# Patient Record
Sex: Female | Born: 1945 | Race: Black or African American | Hispanic: No | Smoking: Never smoker
Health system: Southern US, Community
[De-identification: ages and names within clinical notes are randomized; demographics above are authoritative.]

## PROBLEM LIST (undated history)

## (undated) DIAGNOSIS — I214 Non-ST elevation (NSTEMI) myocardial infarction: Secondary | ICD-10-CM

## (undated) DIAGNOSIS — I219 Acute myocardial infarction, unspecified: Secondary | ICD-10-CM

## (undated) DIAGNOSIS — N183 Chronic kidney disease, stage 3 unspecified: Secondary | ICD-10-CM

## (undated) DIAGNOSIS — D45 Polycythemia vera: Secondary | ICD-10-CM

## (undated) DIAGNOSIS — I10 Essential (primary) hypertension: Secondary | ICD-10-CM

## (undated) DIAGNOSIS — G4730 Sleep apnea, unspecified: Secondary | ICD-10-CM

## (undated) DIAGNOSIS — E785 Hyperlipidemia, unspecified: Secondary | ICD-10-CM

## (undated) DIAGNOSIS — E079 Disorder of thyroid, unspecified: Secondary | ICD-10-CM

## (undated) DIAGNOSIS — F419 Anxiety disorder, unspecified: Secondary | ICD-10-CM

## (undated) DIAGNOSIS — Z9289 Personal history of other medical treatment: Secondary | ICD-10-CM

## (undated) DIAGNOSIS — E039 Hypothyroidism, unspecified: Secondary | ICD-10-CM

## (undated) HISTORY — DX: Polycythemia vera: D45

## (undated) HISTORY — DX: Chronic kidney disease, stage 3 unspecified: N18.30

## (undated) HISTORY — PX: ABDOMINAL HYSTERECTOMY: SHX81

## (undated) HISTORY — DX: Essential (primary) hypertension: I10

## (undated) HISTORY — PX: CARDIAC CATHETERIZATION: SHX172

## (undated) HISTORY — DX: Hyperlipidemia, unspecified: E78.5

---

## 1991-02-09 HISTORY — PX: VAGINAL HYSTERECTOMY: SUR661

## 1999-02-04 ENCOUNTER — Encounter: Admission: RE | Admit: 1999-02-04 | Discharge: 1999-05-05 | Payer: Self-pay | Admitting: Neurology

## 1999-03-13 ENCOUNTER — Ambulatory Visit (HOSPITAL_BASED_OUTPATIENT_CLINIC_OR_DEPARTMENT_OTHER): Admission: RE | Admit: 1999-03-13 | Discharge: 1999-03-13 | Payer: Self-pay | Admitting: Orthopedic Surgery

## 1999-05-25 ENCOUNTER — Encounter: Payer: Self-pay | Admitting: Neurology

## 1999-05-25 ENCOUNTER — Encounter: Admission: RE | Admit: 1999-05-25 | Discharge: 1999-05-25 | Payer: Self-pay | Admitting: Neurology

## 2003-05-02 ENCOUNTER — Encounter
Admission: RE | Admit: 2003-05-02 | Discharge: 2003-05-15 | Payer: Self-pay | Admitting: Physical Medicine & Rehabilitation

## 2006-06-08 ENCOUNTER — Encounter: Payer: Self-pay | Admitting: Cardiology

## 2006-06-09 ENCOUNTER — Inpatient Hospital Stay (HOSPITAL_COMMUNITY): Admission: EM | Admit: 2006-06-09 | Discharge: 2006-06-11 | Payer: Self-pay | Admitting: Emergency Medicine

## 2006-06-09 ENCOUNTER — Ambulatory Visit: Payer: Self-pay | Admitting: Cardiology

## 2006-06-10 ENCOUNTER — Encounter: Payer: Self-pay | Admitting: Internal Medicine

## 2006-07-27 ENCOUNTER — Ambulatory Visit: Payer: Self-pay | Admitting: Physician Assistant

## 2006-08-15 ENCOUNTER — Encounter: Payer: Self-pay | Admitting: Cardiology

## 2006-08-25 ENCOUNTER — Ambulatory Visit: Payer: Self-pay | Admitting: Physician Assistant

## 2006-08-25 ENCOUNTER — Encounter: Payer: Self-pay | Admitting: Cardiology

## 2006-08-29 ENCOUNTER — Encounter: Payer: Self-pay | Admitting: Physician Assistant

## 2006-09-21 ENCOUNTER — Ambulatory Visit: Payer: Self-pay | Admitting: Internal Medicine

## 2006-09-21 ENCOUNTER — Ambulatory Visit: Payer: Self-pay | Admitting: Cardiology

## 2007-02-24 ENCOUNTER — Ambulatory Visit: Payer: Self-pay | Admitting: Cardiology

## 2007-02-24 ENCOUNTER — Encounter: Payer: Self-pay | Admitting: Physician Assistant

## 2007-03-08 ENCOUNTER — Ambulatory Visit: Payer: Self-pay | Admitting: Cardiology

## 2007-03-28 ENCOUNTER — Encounter: Payer: Self-pay | Admitting: Physician Assistant

## 2007-06-14 ENCOUNTER — Ambulatory Visit: Payer: Self-pay | Admitting: Cardiology

## 2008-01-17 DIAGNOSIS — E785 Hyperlipidemia, unspecified: Secondary | ICD-10-CM | POA: Insufficient documentation

## 2008-01-17 DIAGNOSIS — I2510 Atherosclerotic heart disease of native coronary artery without angina pectoris: Secondary | ICD-10-CM | POA: Insufficient documentation

## 2008-01-17 DIAGNOSIS — I10 Essential (primary) hypertension: Secondary | ICD-10-CM | POA: Insufficient documentation

## 2009-06-17 ENCOUNTER — Other Ambulatory Visit: Admission: RE | Admit: 2009-06-17 | Discharge: 2009-06-17 | Payer: Self-pay | Admitting: Interventional Radiology

## 2009-06-17 ENCOUNTER — Encounter: Admission: RE | Admit: 2009-06-17 | Discharge: 2009-06-17 | Payer: Self-pay | Admitting: Internal Medicine

## 2010-06-23 NOTE — Assessment & Plan Note (Signed)
[facility]                          [facility]   [name], [name] STEGMANN                    MRN:          [id number]  DATE:07/27/2006                            DOB:          05-06-1945    PRIMARY CARDIOLOGIST:  Luis Abed, MD, F.A.C.C.   REASON FOR VISIT:  Post-hospital followup.   [name] [name] is a 65 year old female with no prior cardiac history,  recently transferred directly from [facility] to [facility] for  further evaluation of chest pain with associated abnormal cardiac  markers.   Patient was felt to have non-ST-elevation myocardial infarction with a  troponin of 1.37 and an abnormal electrocardiogram.   Cardiac catheterization by Dr. Charlies Constable, however, suggested only  mild nonobstructive coronary artery disease with mild LV dysfunction (EF  50%), but with no areas of hypokinesis.   Further workup consisted of a CT scan of the chest, which was negative  for pulmonary embolus, but did yield a 5 mm right lower lobe nodule with  recommendation to follow up in 12 months.  This also revealed multiple  thyroid nodules and patient is currently undergoing further evaluation  for this by Dr. Sherryll Burger.   From a cardiovascular standpoint, patient denies any recurrent angina  pectoris.  She is tolerating her medications, which include the PLATO  study drug, and had no complications of her right groin incision site.   Electrocardiogram today reveals normal sinus rhythm at 68 BPM with  normal axis and nonspecific ST abnormalities.   CURRENT MEDICATIONS:  1. [name] (versus clopidogrel).  2. Ramipril 5 daily.  3. Carvedilol 6.25 daily.  4. Simvastatin 40 q.h.s.   PHYSICAL EXAMINATION:  Blood pressure 150/101, pulse 71, regular; weight  196.  GENERAL:  Patient is a 65 year old female, sitting upright, in no  distress.  HEENT:  Normocephalic, atraumatic.  NECK:  Supple.  Full bilateral carotid pulses  with no bruits; no JVD at  90 degrees.  LUNGS:  Clear to auscultation in all fields.  HEART:  Regular rate and rhythm (S1, S2).  No murmurs, rubs or gallops.  EXTREMITIES:  Right groin stable with no hematoma, ecchymosis, or bruit  on auscultation.  Normal intact femoral and distal pulse without pedal  edema.  NEUROLOGIC:  No focal deficits.   IMPRESSION:  1. Nonobstructive coronary artery disease.      a.     Status post abnormal serial cardiac markers of undetermined       etiology.      b.     Enrolled in PLATO study.      c.     Question coronary vasospasm.  2. Preserved LV function/moderate LVH.      a.     By 2D echo.  3. Hypertension.  4. Multiple thyroid nodules.  5. Right lung nodule.      a.     Recommended followup CT scan in one year.   PLAN:  1. Aggressive hypertension management with up-titration of Coreg to      6.25 b.i.d. and substitution of Ramipril with lisinopril, given the  patient's severe financial constraints.  2. Follow up with [place] research team with respect to PLATO study,      as recommended.  3. Patient strongly advised to take low-dose aspirin on a daily basis.  4. Recommend fasting lipids/liver profile in three months for      assessment of lipid status.  5. Schedule return clinic followup with myself and Dr. Willa Rough in      three months.      Gene Serpe, PA-C  Electronically Signed      Learta Codding, MD,FACC  Electronically Signed   GS/MedQ  DD: 07/27/2006  DT: 07/27/2006  Job #: [id number]   cc:   Kirstie Peri, MD

## 2010-06-23 NOTE — Assessment & Plan Note (Signed)
[facility]                          [facility]   NAME:[name] [name], [name] [name]                    MRN:          [id number]  DATE:08/25/2006                            DOB:          12/18/1945    PRIMARY CARDIOLOGIST:  [name] Abed, MD, [facility]   PRIMARY CARE PHYSICIAN:  [name] Peri, MD   HISTORY OF PRESENT ILLNESS:  [name] [name] is a 65 year old female  patient with a history of non-ST elevation myocardial infarction in May  of 2008, with mild to moderate non-obstructive coronary disease at  catheterization who was enrolled in the [place] study. She returns to the  office today for followup. She recently came off of the study drug  secondary to excessive bruising. She follows up with Research in a few  weeks for an end-of-study visit. She notes that the bruising is  improving since she stopped the medication. I spoke to [name] [name] in  Research to find out more about the [place] study. Apparently, this is a  study that compares clopidogrel to another platelet inhibitor. The study  is double-blinded and we do not know what [name] [name] was receiving.  She denies any other bleeding. She did have one episode of chest  discomfort recently. She denies any symptoms reminiscent of her  myocardial infarction. She notes that the discomfort was more of an  awareness of maybe some pressure in her chest when she completed a three  mile walk in extreme heat. She denied any shortness of breath, nausea,  diaphoresis or radiating symptoms. She has been walking since then  without any symptoms. She usually 2-3 miles 3-5 days a week. She is  taking all of her medications as prescribed. She has been under quite a  bit of stress lately and actually started to cry during our interview at  one point.   MEDICATIONS:  1. Ramipril 5 mg daily.  2. Simvastatin 40 mg q nightly.  3. Carvedilol 6.25 mg b.i.d.  4. Aspirin 81 mg daily.  5. Multivitamin daily.  6.  Nitroglycerine p.r.n. chest pain.   ALLERGIES:  No known drug allergies.   PHYSICAL EXAMINATION:  She is a well-nourished, well-developed female in  no acute distress. Blood pressure is 164/92, pulse 68, weight 194  pounds.  HEENT: Is normal.  NECK: Without JVD.  CARDIAC: Normal S1, S2, regular rate and rhythm without murmur.  LUNGS:  Are clear to auscultation bilaterally without wheezing, rhonchi  or rales.  ABDOMEN: Soft and nontender.  EXTREMITIES: Without edema.  NEUROLOGIC: She is alert and oriented x3. Cranial nerves II-XII are  grossly intact.  Carotids are without bruits bilaterally.  ENDOCRINE: She does have prominence of the right lobe of her thyroid  noted on examination-she apparently has thyroid ultrasound scheduled  today at [facility].   Electrocardiogram reveals sinus bradycardia with a heart rate of 58.  Normal axis. LVH by voltage criteria. No ischemic changes.   IMPRESSION:  1. Atypical chest pain, non-cardiac.  2. Coronary artery disease.      a.     Status post non-ST elevation myocardial infarction in May of  2008, without obvious culprit at cardiac catheterization.      b.     Left anterior descending artery (LAD) mid 40% stenosis at       cardiac catheterization.      c.     Plato study.  3. Preserved left ventricular function with an ejection fraction of      50% and no wall motion abnormalities at cardiac catheterization Jun 09, 2006.  4. Hypertension, uncontrolled.  5. Hyperlipidemia, treated.  6. Thyroid nodules.      a.     Followup per primary care physician.  7. History of right lung nodule.      a.     Repeat CT scan pending May of 2009.  8. Ecchymosis.      a.     Recent discontinuation of [name].   PLAN:  The patient presents to the office today for followup after  discontinuing her [name]. She did have increased ecchymosis.  She is currently only on a baby aspirin a day. I think in light of her   acute coronary syndrome in May of 2008, she needs to stay on  antiplatelet therapy. However, she has had difficulty with combination  of study drug and aspirin with increased ecchymosis. At catheterization  she has mild to moderate non-obstructive disease. No percutaneous  coronary intervention was performed. In light of her financial  constraints, I do not think it is feasible to try to place her on  Plavix. I think it should be safe for her to remain on aspirin only at  this point in time. Therefore, I have asked her to increase her aspirin  dose to 325 mg a day. We will check a CBC to followup on her platelet  count. She will followup with the research staff as scheduled some time  in the next several weeks.   The patient does need better blood pressure control. I have elected to  place her on 12.5 mg of hydrochlorothiazide in addition to her ramipril.  Will get followup BMET in a week to followup on her renal function and  potassium.   She did report some chest discomfort to me today. This was very atypical  and unlike her myocardial infarction pain. She has been quite active  before and after this episode of chest discomfort without any symptoms.  I do not think this was cardiac. Her electrocardiogram is stable. She  really had no evidence of coronary vasospasm at catheterization. This  could certainly be brought into consideration in the future if her blood  pressure remains uncontrolled. At that point, we could certainly  consider adding something like amlodipine.   She will followup with Dr.  Myrtis [name] as scheduled in the next several  months.      Tereso Newcomer, PA-C  Electronically Signed      Learta Codding, MD,FACC  Electronically Signed   SW/MedQ  DD: 08/25/2006  DT: 08/25/2006  Job #: [id number]   cc:   [name] Peri, MD

## 2010-06-23 NOTE — Assessment & Plan Note (Signed)
[facility]                            CARDIOLOGY OFFICE NOTE   NAME:[name] [name], [name] [name]                    MRN:          [id number]  DATE:09/21/2006                            DOB:          10/10/1945    This patient is being seen as part of the PLATO research program.  She  has completed the program, and this is the final visit.   This is a 65 year old African American female patient who presented to  the hospital with non-ST-elevation MI and abnormal EKG in May but at the  time of cardiac catheterization by Dr. Juanda Chance had only mild  nonobstructive coronary disease.  He felt coronary spasm was a  possibility, and she did have transient ST depression during the  procedure but felt this would be a diagnosis of exclusion.  She has not  had any chest pain since then.  She is walking 2-3 miles a day 5 days a  week and is doing quite well.  She did develop quite a bit of bruising  while on the PLATO study which compared Plavix to another platelet  inhibitor.  Once this was stopped and she was on the aspirin alone, the  bruising improved.   CURRENT MEDICATIONS:  1. Aspirin 81 mg daily.  2. Plavix is stopped.  3. Coreg 6.25 mg b.i.d.  4. Simvastatin 40 mg q.h.s.  5. Altace 5 mg daily.  6. Hydrochlorothiazide 12.5 mg daily.  7. Multivitamin daily.   PHYSICAL EXAMINATION:  GENERAL:  This is a pleasant 65 year old black  female in no acute distress.  VITAL SIGNS:  Blood pressure 142/90, pulse 71, weight 192.  NECK:  Without JVD, HR, bruit, or thyroid enlargement.  LUNGS:  Clear anterior, posterior, and lateral.  HEART:  Regular rate and rhythm at 70 beats per minute.  Normal S1 and  S2 with a 1/6 systolic ejection murmur at the left sternal border.  ABDOMEN:  Soft without organomegaly, masses, lesions.  EXTREMITIES:  Without clubbing, cyanosis, or edema.  She has good distal  pulses.   IMPRESSION:  1. Non-ST-segment elevation myocardial infarction on  Jun 09, 2006,      question secondary to coronary spasm with minimal nonobstructive      coronary artery disease on catheterization.  2. Hypertension.  3. Chronic sinusitis.  4. Multiple thyroid nodules found on CT as well as a 5-mm nodule in      her right lower lobe.  Recommend followup CT in 12 months.  5. Hyperlipidemia.  6. Obesity.   PLAN:  The patient is stable from a cardiac standpoint.  I will leave  her off of Plavix since she had so much bruising on the study drug.  She  has a scheduled appointment to see Dr. Myrtis Ser back on November 11, 2006.  She knows to call if she has any problems in the interim.  Her blood  pressure is elevated, and I have increased her Coreg to 12.5 mg b.i.d.      Jacolyn Reedy, PA-C  Electronically Signed      Doylene Canning. Ladona Ridgel, MD  Electronically Signed  ML/MedQ  DD: 09/21/2006  DT: 09/22/2006  Job #: [id number]

## 2010-06-23 NOTE — Assessment & Plan Note (Signed)
[facility]                          [facility] NOTE   NAME:Sharber, CADI RHINEHART                    MRN:          [id number]  DATE:02/24/2007                            DOB:          1945/10/24    CARDIOLOGIST:  Luis Abed, MD.   PRIMARY CARE PHYSICIAN:  Kirstie Peri, MD.   REASON FOR VISIT:  Walk-in for blood pressure.   HISTORY OF PRESENT ILLNESS:  Ms. [name] [name] is a 65 year old female  patient with a history of non-ST-elevation myocardial infarction in May  2008, who had just mild to moderate nonobstructive coronary disease at  catheterization.  The culprit for her non-ST elevation MI was not clear.  She was treated medically.  When last seen in the office in July 2008,  her blood pressure medications were adjusted.  We added HCTZ to her  medication regimen.   The patient sees Dr. Leslie Dales for her goiter.  She saw him in September  and her blood pressure was 170/116, and her blood pressure today at an  appointment with him was 152/100.  He had asked the patient to follow up  with [place] to get her blood pressure under control.  The patient walked  into the office today.  I spoke to Dr. Leslie Dales, who noted that the  patient just needed to follow up for blood pressure.  He felt that she  was somewhat confused about her medications.   The patient notes a significant headache.  This has been ongoing for the  last 3 weeks.  It seems to be related to sinus congestion.  She denies  any fevers but has noted a purulent postnasal drip.  She denies any  otalgia but has had some dental pain.  She denies any visual changes.  She denies any photophobia or nausea and vomiting.  She denies any chest  pain or shortness of breath.  She denies any monocular blindness,  unilateral weakness, difficulty with speech or facial droop.   CURRENT MEDICATIONS:  1. Simvastatin 40 mg q.h.s.  2. Carvedilol 6.25 mg b.i.d.  3. Hydrochlorothiazide 25 mg daily.  4. Aspirin 325 mg daily.   ALLERGIES:  No known drug allergies.   PHYSICAL EXAM:  She is a well-nourished, well-developed female, in no  acute distress.  Blood pressure 150/92 on the right, 145/74 on the left.  Repeat blood  pressure by me is 154/90 on the left, 158/94 on the right.  Pulse 65.  Respirations 15.  HEENT:  Head:  Normocephalic, atraumatic.  Eyes:  PERRLA, EOMI.  Sclerae  are clear.  Fundi are within normal limits bilaterally.  Mucous  membranes were moist.  LYMPHATIC:  Without lymphadenopathy.  CARDIAC:  Normal S1, S2, regular rate and rhythm.  LUNGS:  Clear to auscultation bilaterally.  ABDOMEN:  Soft, nontender.  EXTREMITIES:  Without edema.  NEUROLOGIC:  She is alert and oriented x3.  Cranial nerves II-XII are  grossly intact.  Strength is 5/5 all extremities and axial groups.   IMPRESSION:  1. Hypertension, uncontrolled.  2. Coronary artery disease.      a.     Status  post non-ST-elevation myocardial function in May 2008       without obvious culprit (mid left anterior descending artery 40%       stenosis at catheterization).      b.     Previous PLATO study participant.  3. Good left ventricular function, ejection fraction of 50%.  4. Hyperlipidemia.  5. Goiter, followed by Dr. Leslie Dales.  6. History of right lung nodule.      a.     Repeat CT scan pending May 2009.  7. Acute sinusitis.   PLAN:  The patient presents as a walk-in to the office for blood  pressure.  As noted, I spoke with Dr. Leslie Dales, who was concerned that  she was somewhat confused about her medications.  She had had an  appointment set up for January 28 but decided to come in today.  She  does have a headache that seems to be more consistent with sinusitis  than anything else.  Her symptoms are not consistent with end-organ  damage.  She does need better blood pressure control.  She is somewhat  confused about her medications.  She was supposed to have  hydrochlorothiazide added to her  lisinopril but instead she discontinued  her lisinopril.  At this point in time, we plan to:   1. Decrease her hydrochlorothiazide to 12.5 mg a day.  2. Add back lisinopril 10 mg a day.  3. Check a BMET today and follow up BMET in one week's time.  4. Treat her sinusitis with Augmentin XR 2 tablets b.i.d. for 10 days.      I have also asked her to take Mucinex 600 mg b.i.d. over-the-      counter for 10 days.  She is to obtain saline nose spray.  She can      use Afrin for 2-3 days only if necessary.  I do not think this will      affect her blood pressure much if she needs to use it.  5. I have written out all of the medications for her today so that she      better understands what      she is supposed to be taking.  6. She will follow up as scheduled with Dr. Myrtis Ser March 08, 2007, or      sooner as needed.      Tereso Newcomer, PA-C  Electronically Signed      Jonelle Sidle, MD  Electronically Signed   SW/MedQ  DD: 02/24/2007  DT: 02/25/2007  Job #: [id number]   cc:   Kirstie Peri, MD  Veverly Fells. [place], M.D.

## 2010-06-23 NOTE — Assessment & Plan Note (Signed)
[facility]                                 ON-CALL NOTE   NAME:[name] [name], [name] [name]                    MRN:          [id number]  DATE:08/13/2006                            DOB:          [birth year]    Telephone conversation with Francisca December dated August 13, 2006 at 13:58,  attending physician Dr. Dietrich Pates.  I received a call through the  answering service for [facility].  She states she is a participant  in the [place] drug study and is either on Plavix or another dry for her  heart disease.  She states she has been taking the medication, so she  was discharged home, but on the previous date noticed some unusual  bruising on her lower extremities and then began to notice the same  bruise pattern over her body; however, she states she has not fallen or  injured herself or run into anything, denied any active bleeding, no  pain around bruising, but she was very concerned because none of this  started until after she began taking the medication.  I asked her if she  had a phone number to get in touch with the research department.  She  stated she had a #[phone], which is the hospital operator.  I then told  her I would see what I could do.  I called the operator to see if she  had a number to get in touch with the research department.  She stated  that she did not have any numbers other than the pagers that we had for  research team.  I then tried to call Kennon Rounds with the research department  at home.  There was no answer.  I was unable to leave a message.  I then  called Ms. Rovira back and told her not to take the medication that  day or Sunday, and I would have someone from the research department get  in touch with her as soon as possible Monday morning.  I instructed her  to continue her aspirin at this time.  On Monday morning, I then spoke  with Kennon Rounds by pager and relayed the information to her and asked that  someone please call the patient as  soon as possible for further  clarification, and Kennon Rounds stated that she would call her promptly.     Dorian Pod, ACNP  Electronically Signed    MB/MedQ  DD: 08/15/2006  DT: 08/15/2006  Job #: [id number]

## 2010-06-23 NOTE — Assessment & Plan Note (Signed)
[facility]                          [facility]   NAME:[name] [name], [name] [name]                    MRN:          [id number]  DATE:03/08/2007                            DOB:          12-May-1945    [name] [name] is seen today for follow-up. See the complete note of  February 24, 2007.  On that day, he her medicines were adjusted.  Her  potassium was low with a repeat blood test and she was started on  potassium.  I talked to her today about salt and fluid intake.  She is  watching her salt, but in fact she has excess fluid intake and she will  cut back on this.   PAST MEDICAL HISTORY:   ALLERGIES:  NO KNOWN DRUG ALLERGIES.   MEDICATIONS:  1. Simvastatin.  2. Carvedilol.  3. Aspirin.  4. Hydrochlorothiazide.  5. Multivitamin.  6. K-Dur 20.  7. Ramipril dose to be determined we think 5 mg.  She will call back      with his today.  8. Augmentin.   OTHER MEDICAL PROBLEMS:  See the list on the note of February 24, 2007.   REVIEW OF SYSTEMS:  She is stable today.  Her review of systems is  negative.   PHYSICAL EXAM:  Blood pressure today is 150/92.  Her pulse is 75.  Weight is 198 pounds.  The patient is oriented to person, time and  place.  Affect is normal.  HEENT:  Reveals no xanthelasma.  She has normal extraocular motion.  There are no carotid bruits.  There is no jugular venous distention.  Lungs are clear.  Respiratory effort is not labored.  Cardiac exam reveals S1-S2.  There are no clicks or significant murmurs.  The abdomen is soft.  The patient is obese.  There is no peripheral edema.   Problems are listed on the note of February 24, 2007.  Today her blood  pressure remains mildly elevated.  We will check to be sure that she is  on 5 of ramipril.  In addition, she will cut back on her fluid intake  and I will see her back in several weeks to reassess her blood pressure.     Luis Abed, MD, [facility]  Electronically  Signed    JDK/MedQ  DD: 03/08/2007  DT: 03/08/2007  Job #: [id number]   cc:   Kirstie Peri, MD

## 2010-06-26 NOTE — Discharge Summary (Signed)
NAME:  [name] [name], [name] [name]             ACCOUNT NO.:  [id number]   MEDICAL RECORD NO.:  [id number]          PATIENT TYPE:  INP   LOCATION:  6526                         FACILITY:  [facility]   PHYSICIAN:  Madolyn Frieze. Jens Som, MD, FACCDATE OF BIRTH:  1945/03/23   DATE OF ADMISSION:  06/09/2006  DATE OF DISCHARGE:  06/11/2006                         DISCHARGE SUMMARY - REFERRING   SUMMARY OF HISTORY:  [name] [name] was a 65 year old African American  female transferred from [facility] for evaluation  of chest discomfort.  On the day prior to admission at approximately  6:35 p.m. while pushing a wheelchair, she developed a sudden onset of  anterior right-sided chest-aching sensation radiating into the left arm.  This was not associated with shortness of breath, nausea, vomiting, or  diaphoresis.  She states that she felt funny and got a chill.  She gave  the sensation as a 10 over on a scale of 0-10.  She drove home and laid  down without relief.  She took some Tylenol but was unable to get  comfortable.  After the Tylenol, her discomfort was an 8 on a scale of 0-  10.  Due to the persistence, her son drove her to [facility] at  9:30.  At some point during [facility] ER visit, she received aspirin,  nitroglycerin paste, IV heparin, and Lopressor.  Her discomfort abated.  She was transferred to [facility] for further evaluation and remains pain-  free at this time.  She is not sure which time her chest discomfort had  actually resolved.   PAST MEDICAL HISTORY:  1. Hypertension that she does not check on a regular basis at home.  2. Chronic sinusitis.   LABORATORY DATA:  Chest x-ray did not show any active disease.  Chest CT  on May 02 did not reveal any evidence of pulmonary emboli.  She had  multiple thyroid nodules.  She also has a 5 mm nodule in the right lower  lobe recommended follow-up CT scan in 12 months; if nodule remained  unchanged at that time further evaluation  was not recommended; minimal  atelectasis or scarring in both lungs.  Admission weight is 75 kg.  Discharge weight was 86.6 kg.  Admission H&H was 13.0 and 38.5, normal  indices, platelets 257, WBCs 5.8.  Sodium 141, potassium 3.6, BUN 10,  creatinine 0.8, normal LFTs.  Prior to discharge, sodium 139, potassium  4.0, BUN 13, creatinine 0.83, hemoglobin A1c was elevated at 6.3,  initial CK-MB was 201, 14.2, relative index 7.1, and troponin of 1.4.  Subsequent CK-MBs and relative indexes revealed a declining pattern.  Troponins were 0.91 and 0.95.  TSH was within normal limits at 1.366, T3  uptake was high at 38.3, free T4 was 1.21.  Fasting lipids were ordered  but were never done.  EKGs initially showed sinus tachycardia.  Subsequent EKGs showed sinus brady; normal sinus rhythm, nonspecific ST-  T wave changes.  Echocardiogram was also performed; it showed an EF of  60-65% without wall motion abnormalities, mild to moderate LVH, findings  consistent with diastolic dysfunction.   HOSPITAL COURSE:  The  patient was started on IV heparin, Integrilin.  She was seen by [facility].  She underwent a cardiac catheterization on  06/09/2006 by Dr. Juanda Chance for further evaluation of her symptoms and  abnormal enzymes.  Her EF was 60% without wall motion abnormalities and  she had a 40% mid LAD lesion.  Dr. Juanda Chance noted that there was no source  of ischemia despite elevated markers.  This was discussed with Dr. Myrtis Ser.  A chest CT to rule out pulmonary embolism and an echocardiogram were  ordered.  Postprocedure, catheterization site had some oozing.  She did  not have any further chest discomfort.  She was noted to be hypertensive  and medications were adjusted.  It was noted on initial exam to the  hospital she had thyromegaly.  However, TSH was within normal limits.  Her CT was negative for pulmonary embolism.  However, she did have  multiple thyroid nodules and a  lung nodule which was recommended   further evaluation as an outpatient.  Echocardiogram was obtained.  Given her probable diabetes, diabetes coordinator provided the patient  with some information as far as carb-counting.  The patient watched the  diabetic videos.  Nutrition also spoke with the patient and encouraged  to increase fiber, vegetable, and whole grains, and decrease  concentrated sweets, and regular meals, and recommended outpatient  followup.  [facility] was also involved as she was entered into  the PLATO study.  By May 3, she was ambulating the halls without  difficulty and it was felt that she could be discharged home with  further outpatient evaluation.   DISCHARGE DIAGNOSES:  1. Non-ST-elevated myocardial infarction.  2. Hypertension.  3. Hyperglycemia with an elevated hemoglobin A1c.  4. Probable diabetic.  5. Thyromegaly.  6. Obesity.  7. Probable hyperlipidemia.  8. Nonobstructive coronary artery disease.   PROCEDURES PERFORMED:  Cardiac catheterization on Jun 09, 2006 by Dr.  Juanda Chance.   DISPOSITION:  The patient is discharged home.  Wound care and activities  are listed per supplemental sheet.  However, she was advised no lifting,  driving, sexual activity, or heavy exertion for 2 weeks.   NEW MEDICATIONS:  1. Aspirin 81 mg daily.  2. Plavix versus [name].  3. Coreg 6.25 mg b.i.d.  4. Zocor 40 mg at hour of sleep.  5. Altace 5 mg daily.  6. Multivitamin daily.  7. Nitroglycerin 0.4 as needed for chest discomfort.   She will need blood work in 6-8 weeks in regards to SLPs and LFTs since  statin was initiated.  She was also asked to begin a blood pressure  diary to bring all medicines in her blood pressure diary to all  appointments.  She was asked to follow up with Dr. Sherryll Burger in regards to  her thyromegaly, elevated sugars/probable diabetes, and the need for a  CT  scan in approximately 12 months to follow up on a lung nodule.  She will see Dr. Henrietta Hoover physician assistant in  [place] on Jun 27, 2006 at 1:15 for  followup.   DISCHARGE TIME:  Forty minutes.      Joellyn Rued, PA-C      Madolyn Frieze Jens Som, MD, [facility]  Electronically Signed    EW/MEDQ  D:  06/11/2006  T:  06/11/2006  Job:  [id number]   cc:   Luis Abed, MD, [facility]  Kirstie Peri, MD

## 2010-06-26 NOTE — Op Note (Signed)
[place]  Patient:    [name] [name]                       MRN: [id number] Proc. Date: 03/13/99 Adm. Date:  [id number] Attending:  Milly Jakob CC:         Harvie Junior, M.D.                           Operative Report  PREOPERATIVE DIAGNOSIS: 1. Lateral epicondylitis. 2. Electromyogram documented radial nerve compression.  POSTOPERATIVE DIAGNOSIS:  OPERATION PERFORMED: 1. Lateral epicondyle release. 2. Decompression of radial tunnel.  SURGEON:  Harvie Junior, M.D.  ASSISTANT:  Kerby Less, P.A.  ANESTHESIA:  General.  BRIEF HISTORY:  She is a 65 year old female with a long history of right upper extremity pain.  She had some good response with lateral epicondylar injection ut because of continued pain in the dorsal aspect of the forearm, EMG was obtained  which showed that she in fact did have radial nerve compression.  We discussed t that time conservative care and ultimately, she elected to undergo operative intervention because of the continued pain and failure of conservative care. She is brought to the operating room for this procedure.  DESCRIPTION OF PROCEDURE:  The patient was brought to the operating room and after adequate anesthesia was obtained with a general anesthetic, patient was placed supine on the operating table.  The right arm was then prepped and draped in the usual sterile fashion.  Following this, a curved incision was made starting just proximal to the lateral epicondyle and curving to the midline dorsally to allow  access to the radial tunnel.  The extensor carpi radialis brevis was then identified.  An incision was made longitudinally in the fascia of the brevis. he scarred tissue in the area of the epicondyle was identified and the epicondyle as rongeured.  The scar tissue in the brevis origin was identified and resected. he radiocapitellar joint was opened and identified.  There was  some synovial reaction within the radiocapitallar joint which was debrided and the joint was irrigated  copiously.  Following this, the opening in the brevis was closed and attention as then turned towards the ____________ which was retracted to allow access to the  deep radial nerve.  The radial nerve was identified as well as the division of he posterior interosseous nerve and the superficial radial nerve.  The nerve was dissected proximally and a Sewall retractor was put up.  All evidence of compression was released.  The vessels overlying the posterior interosseous nerve were then identified and a Freer retractor was placed on the nerve and a bipolar cautery was used to divide the recurrent ____________ vessels in this location.  Following this, the nerves opening into the supinator was identified, was noted to be significantly compressed at this level and the fascial ____________ of the proximal supinator was divided.  The nerve was then tracked into the belly of the supinator and the supinator and the supinator was divided approximately an inch to allow complete visualization of the nerve tracking distally in the supinator. nce this was identified, the supinator muscle was divided.  The tourniquet was let own to ensure there was no significant bleeding in this area and there was not. The wound was then copiously irrigated with normal saline irrigation and suctioned ry. A sterile compressive dressing was applied as well as a posterior  plaster splint and the patient was taken to the recovery room where she was noted to be in satisfactory condition.  Estimated blood loss for the procedure was none. DD:  03/13/99 TD:  03/14/99 Job: [id number]

## 2010-06-26 NOTE — H&P (Signed)
NAME:  [name] [name], [name] [name] NO.:  [id number]   MEDICAL RECORD NO.:  [id number]          PATIENT TYPE:  EMS   LOCATION:  MAJO                         FACILITY:  [facility]   PHYSICIAN:  Luis Abed, MD, FACCDATE OF BIRTH:  1945-02-19   DATE OF ADMISSION:  06/09/2006  DATE OF DISCHARGE:                              HISTORY & PHYSICAL   SUMMARY OF HISTORY:  [name] [name] is a 65 year old African American  female who was transferred from [facility] for  evaluation of chest discomfort.   She stated that yesterday evening at approximately 6:35 p.m. while  pushing a wheelchair she developed the sudden onset of anterior right-  sided chest aching sensation radiating into her left arm.  She denied  associated shortness of breath, nausea, vomiting or diaphoresis.  She  stated that she felt funny and had chills, but denied actual  diaphoresis.  Her discomfort was a 10 on scale of 0-10.  She drove home,  took some Tylenol and lay down without any improvement.  She was unable  to get comfortable.  Around 9:30 p.m. she stated that her discomfort was  an 8 on a scale of 0-10 and due to persistence, her son drove her to the  emergency room for further evaluation.   At [facility] emergency room the patient received nitroglycerin paste,  aspirin 81 x4, IV heparin and Lopressor 5 mg with relief of her  discomfort at an uncertain time.  She has not had any further  discomfort.  Initial EKG at [facility] showed sinus rhythm with a  ventricular rate of 94, left axis deviation, delayed R-wave, T-wave  inversion in I and aVL, possible early repolarization.  Subsequent EKG  at [facility] at 6:30 showed sinus bradycardia at 54, left axis deviation,  T-wave inversion in I and aVL, delayed R-wave.  EKG at [facility] shows normal  sinus rhythm, left axis deviation, T-wave inversion in aVL,  significantly delayed R-wave.   PAST MEDICAL HISTORY:  NO KNOWN DRUG ALLERGIES.   MEDICATIONS PRIOR TO ADMISSION:  Include Cardizem 360 daily and  multivitamin daily.   She has a history of hypertension; rarely checks her blood pressures at  home.  She has chronic problems with her sinuses.  Surgeries include a  right forearm surgery, left neck cyst removal, status post hysterectomy.  She denies any history of diabetes, myocardial infarction, CVA, COPD,  bleeding dyscrasia, thyroid dysfunction or hyperlipidemia.   SOCIAL HISTORY:  She resides in [place] with her 3 children.  She has a  total of 2 biological children, 2 adopted children.  She operates a day  care.  She denies any history of tobacco, alcohol, drug usage.  She does  not take any herbal medications.  She tries to maintain a low carb diet.  She does not exercise regularly.   FAMILY HISTORY:  Mother died in her 56s with lupus; she also was on  dialysis.  Her father also died in his 53s with colon cancer.  She has 1  sister deceased at the age of 36 with breast cancer.  Sisters and 2  brothers  are alive and well.   REVIEW OF SYSTEMS:  In addition to the above is notable for glasses, dry  cough, nocturia, postmenopausal, constipation.  All other systems are  unremarkable.   PHYSICAL EXAM:  GENERAL:  Well-nourished, well-developed pleasant  African American female in no apparent distress.  VITAL SIGNS:  Temperature is 97.2, blood pressure 152/889, pulse 56  regular, respirations 20 and regular, saturations 100% on 2 L.  HEENT: Normocephalic, atraumatic.  PERRLA.  EOMs and sclerae are intact.  Dentition is unremarkable.  NECK:  Supple without carotid bruits or JVD.  She does have thyromegaly,  particularly on the right side.  CHEST:  Symmetrical excursion.  Clear to auscultation without rales,  rhonchi or wheezing.  HEART:  PMI is not displaced.  Regular rate and  rhythm.  I do not appreciate any murmurs, rubs, clicks or gallops.  All  pulses are symmetrical and intact without any abdominal or femoral   bruits.  SKIN:  Integument is also intact.  ABDOMEN:  Obese.  Bowel sounds present without organomegaly, masses or  tenderness.  EXTREMITIES:  Negative cyanosis, clubbing or edema.  MUSCULOSKELETAL/NEUROLOGIC:  Unremarkable.   Chest x-ray from [facility]:  Poor quality rotation.  There is possible  right hilar node.  H&H is 14.2 and 41.6, normal indices, platelets 282,  WBCs 8.3.  Sodium 137, potassium 3.3, BUN 16, creatinine 0.9, glucose  122.  PTT 27.3, PT 11.7.  Initial CK-MB was 173.6 and troponin of 0.14.  Seconds CK-MB was 192 and 10.7 and troponin 1.37.   IMPRESSION:  1. Non-ST EMI with abnormal EKG.  Transferred from [facility]      for further evaluation.  2. Thyromegaly.  3. Possible hilar notes on chest x-ray.  4. Obesity.  5. Hypertension.  6. History as noted per past medical history.   DISPOSITION:  We will continue her IV heparin and transfer medications.  We will admit her for further evaluation.  We will check thyroid studies  as well as a hemoglobin A1c and fasting lipids.  Research has been  notified.  We will start a 2V3A inhibitor.  Cardiac catheterization has  been arranged for today for further evaluation.  The procedure risks and  benefits have been explained to the patient.  She agrees to proceed as  planned.  Her thyroid evaluation could be followed up as an outpatient  with her primary care physician.  Further plans will be based on  findings.      Joellyn Rued, PA-C      Luis Abed, MD, [facility]  Electronically Signed    EW/MEDQ  D:  06/09/2006  T:  06/09/2006  Job:  [id number]   cc:   Dr. Clelia Croft

## 2010-06-26 NOTE — Cardiovascular Report (Signed)
NAME:  [name] [name], [name] [name]             ACCOUNT NO.:  [id number]   MEDICAL RECORD NO.:  [id number]          PATIENT TYPE:  INP   LOCATION:  2807                         FACILITY:  [facility]   PHYSICIAN:  Everardo Beals. Juanda Chance, MD, FACCDATE OF BIRTH:  23-Jan-1946   DATE OF PROCEDURE:  06/09/2006  DATE OF DISCHARGE:                            CARDIAC CATHETERIZATION   CLINICAL HISTORY:  [name] is 65 years old and was admitted  yesterday to [facility] with chest pain.  Her troponins and CK-  MBs were positive for a non-ST-elevation infarction.  She was  transferred here this morning for further evaluation.  An ECG showed Q  waves in V1 and V2.  She was pain-free today.   PROCEDURE:  The procedure was performed via the femoral artery using an  arterial sheath and 6-French preformed coronary catheters.  A front wall  arterial puncture was performed and Omnipaque contrast was used.  The  patient developed a hematoma around the sheath during the procedure and  we had to hold pressure around the sheath.  She had been treated with  Integrilin.  The right femoral artery was closed at the end of the  procedure with AngioSeal.  The patient tolerated the procedure well and  left the laboratory in satisfactory condition.   RESULTS:  Left main coronary artery:  The left main coronary artery was  free of significant disease.   Left anterior descending artery:  The left anterior descending artery  gave rise to two diagonal branches and two septal perforators.  There  was 40% narrowing in the mid LAD.   The circumflex artery:  The circumflex artery gave rise to a marginal  branch and a posterolateral branch.  These vessels were free of  significant disease.   The right coronary artery:  The right coronary artery is a moderate-  sized vessel that gave rise to a right ventricular branch, a posterior  descending branch and two posterolateral branches.  These vessels were  free of significant  disease.   The left ventricle:  Left ventriculogram performed in the RAO projection  showed good wall motion with no areas of hypokinesis.  The estimated  ejection fraction was 50%.   Hemodynamic data:  The aortic pressure was 149/86 with a mean of 113 and  left ventricular pressure 149/17.   CONCLUSION:  Mild nonobstructive coronary artery disease with 40%  narrowing in the mid left anterior descending coronary artery, no  significant obstruction of the circumflex and right coronaries and  normal left ventricular function.   RECOMMENDATIONS:  The patient has only mild nonobstructive coronary  disease.  The etiology for her chest pain and abnormal troponins and CK-  MBs is not clear.  Pulmonary embolism is a possibility and will plan  further evaluation for this with a CT of the chest with contrast  tomorrow.  Will get a 2-D echo to further evaluate other  possible causes.  We see part of the aortic root on the left  ventriculogram.  There is no suggestion of dissection, but will evaluate  that further with the echo.  Coronary spasm is  a possibility and she did  have some transient ST depression during the procedure, but this would  have to be a diagnosis of exclusion.      Bruce Elvera Lennox Juanda Chance, MD, [facility]  Electronically Signed     BRB/MEDQ  D:  06/09/2006  T:  06/09/2006  Job:  [id number]   cc:   Dr. Myrtis Ser

## 2011-07-13 DIAGNOSIS — M25559 Pain in unspecified hip: Secondary | ICD-10-CM

## 2011-07-15 ENCOUNTER — Encounter (HOSPITAL_COMMUNITY): Payer: Self-pay | Admitting: *Deleted

## 2011-07-15 ENCOUNTER — Emergency Department (HOSPITAL_COMMUNITY)
Admission: EM | Admit: 2011-07-15 | Discharge: 2011-07-15 | Disposition: A | Discharge status: Home / Self Care | Payer: No Typology Code available for payment source | Attending: Emergency Medicine | Admitting: Emergency Medicine

## 2011-07-15 DIAGNOSIS — S39012A Strain of muscle, fascia and tendon of lower back, initial encounter: Secondary | ICD-10-CM

## 2011-07-15 DIAGNOSIS — R51 Headache: Secondary | ICD-10-CM | POA: Insufficient documentation

## 2011-07-15 DIAGNOSIS — Y9229 Other specified public building as the place of occurrence of the external cause: Secondary | ICD-10-CM | POA: Insufficient documentation

## 2011-07-15 DIAGNOSIS — W010XXA Fall on same level from slipping, tripping and stumbling without subsequent striking against object, initial encounter: Secondary | ICD-10-CM | POA: Insufficient documentation

## 2011-07-15 DIAGNOSIS — M549 Dorsalgia, unspecified: Secondary | ICD-10-CM | POA: Insufficient documentation

## 2011-07-15 DIAGNOSIS — S060X9A Concussion with loss of consciousness of unspecified duration, initial encounter: Secondary | ICD-10-CM

## 2011-07-15 HISTORY — DX: Essential (primary) hypertension: I10

## 2011-07-15 HISTORY — DX: Disorder of thyroid, unspecified: E07.9

## 2011-07-15 MED ORDER — OXYCODONE-ACETAMINOPHEN 5-325 MG PO TABS
1.0000 | ORAL_TABLET | Freq: Once | ORAL | Status: AC
  Administered 2011-07-15: 1 via ORAL
  Filled 2011-07-15: qty 1

## 2011-07-15 MED ORDER — OXYCODONE-ACETAMINOPHEN 5-325 MG PO TABS: 1.0000 | ORAL_TABLET | ORAL | Status: AC | PRN

## 2011-07-15 NOTE — ED Provider Notes (Signed)
History  This chart was scribed for [name] Gaskins, MD by Bennett Scrape. This patient was seen in room APA15/APA15 and the patient's care was started at 8:49PM.   CSN: [id number]  Arrival date & time 07/15/11  2027   First MD Initiated Contact with Patient 07/15/11 2049      Chief Complaint  Patient presents with  . Fall    Patient is a 66 y.o. female presenting with fall. The history is provided by the patient. No language interpreter was used.  Fall The accident occurred 2 days ago. The fall occurred while walking. She landed on a hard floor. There was no blood loss. The point of impact was the head. The pain is present in the head. She was ambulatory at the scene. There was no entrapment after the fall. There was no alcohol use involved in the accident.    ERLEEN [name] is a 66 y.o. female who presents to the Emergency Department complaining of a slip and fall 2 days ago in the [facility] [place]. Pt states that she fell backwards landing her lower back and hitting her head. She is unsure of LOC. She states that she experienced a couple hours of blurred vision after the fall but states that this has since resolved.  She was seen at [facility] for lower back pain and HA and had a full radiology work up done. She had a negative CT scan of the head, a negative CT scan of C spine, a negative chest x-ray, and a negative pelvis film. She was diagnosed with a concussion and dicharged home. She reports that she has a follow up appointment 07/20/11 with her PCP. She denies being prescribed pain medication. She states that she has experienced constant, gradually worsening lower back pain that radiates sharp pains down the left leg and diffuse HA. She reports that laying down and standing worsens the back pain. She denies a h/o back pain or prior back surgeries. She reports that she has tried taking Motrin and ibuprofen but states that she has vomited them both up. She denies fever, chest  pain, abdominal pain and visual disturbances as associated symptoms. She denies currently being on blood thinners. She has a h/o HTN and thyroid disease. She denies smoking and alcohol use.  Dr. Felecia Shelling is her PCP.    Past Medical History  Diagnosis Date  . Thyroid disease   . Hypertension     Past Surgical History  Procedure Date  . Abdominal hysterectomy     No family history on file.  History  Substance Use Topics  . Smoking status: Never Smoker   . Smokeless tobacco: Not on file  . Alcohol Use: No    Review of Systems  A complete 10 system review of systems was obtained and all systems are negative except as noted in the HPI and PMH.   Allergies  Review of patient's allergies indicates no known allergies.  Home Medications  No current outpatient prescriptions on file.  Triage Vitals: BP 155/89  Pulse 77  Temp(Src) 97.8 F (36.6 C) (Oral)  SpO2 100%  Physical Exam  Nursing note and vitals reviewed.  CONSTITUTIONAL: Well developed/well nourished HEAD AND FACE: Normocephalic/atraumatic EYES: EOMI/PERRL ENMT: Mucous membranes moist NECK: supple no meningeal signs SPINE: entire spine non tender, left lumbar paraspinal tenderness, No bruising/crepitance/stepoffs noted to spine CV: S1/S2 noted, no murmurs/rubs/gallops noted LUNGS: Lungs are clear to auscultation bilaterally, no apparent distress ABDOMEN: soft, nontender, no rebound or guarding GU:no cva  tenderness NEURO: Pt is awake/alert, moves all extremitiesx4.  She can ambulate.    equal distal motor: hip flexion/knee flexion/extension, ankle dorsi/plantar flexion,  EXTREMITIES: pulses normal, full ROM, no deformity, All other extremities/joints palpated/ranged and nontender SKIN: warm, color normal PSYCH: no abnormalities of mood noted  ED Course  Procedures   DIAGNOSTIC STUDIES: Oxygen Saturation is 100% on room air, normal by my interpretation.    COORDINATION OF CARE: 9:00PM-Discussed reviewing  radiology reports from [facility] with pt and pt agreed to plan. 9:03PM-Pt records from [place] on 07/13/11 state that pt had a negative CT scan of the head, a negative CT scan of C spine, a negative chest x-ray, and a negative pelvis film.  9:07PM-Informed pt of radiology results from [facility]. Discussed discharge plan of pain medication with pt and pt agreed to plan. Suspect she may have lumbar radiculopathy as she reports some pain radiates down her left leg.  There is no focal motor deficits, she can ambulate, and no bony tenderness.  Doubt spinal fracture. She reports point of impact was her head during fall.  She already has f/u arranged.    The patient appears reasonably screened and/or stabilized for discharge and I doubt any other medical condition or other [facility] requiring further screening, evaluation, or treatment in the ED at this time prior to discharge.      MDM  Nursing notes including past medical history, social history and family history reviewed and considered in documentation Previous records reviewed and considered - ct imaging/xray imaging from [place] seen on PACS/reviewed and negative from 6/4       I personally performed the services described in this documentation, which was scribed in my presence. The recorded information has been reviewed and considered.      [name] Gaskins, MD 07/15/11 [phone]

## 2011-07-15 NOTE — ED Notes (Signed)
States she fell at [facility] 07-13-2011. Pain in left leg, states she fell backwards and her head is hurting, states she is seeing stars

## 2011-08-20 ENCOUNTER — Emergency Department (HOSPITAL_COMMUNITY)
Admission: EM | Admit: 2011-08-20 | Discharge: 2011-08-20 | Disposition: A | Discharge status: Home / Self Care | Payer: Medicare Other | Attending: Emergency Medicine | Admitting: Emergency Medicine

## 2011-08-20 ENCOUNTER — Emergency Department (HOSPITAL_COMMUNITY): Payer: Medicare Other

## 2011-08-20 ENCOUNTER — Encounter (HOSPITAL_COMMUNITY): Payer: Self-pay

## 2011-08-20 DIAGNOSIS — E079 Disorder of thyroid, unspecified: Secondary | ICD-10-CM | POA: Insufficient documentation

## 2011-08-20 DIAGNOSIS — M439 Deforming dorsopathy, unspecified: Secondary | ICD-10-CM

## 2011-08-20 DIAGNOSIS — M5416 Radiculopathy, lumbar region: Secondary | ICD-10-CM

## 2011-08-20 DIAGNOSIS — J329 Chronic sinusitis, unspecified: Secondary | ICD-10-CM

## 2011-08-20 DIAGNOSIS — I10 Essential (primary) hypertension: Secondary | ICD-10-CM | POA: Insufficient documentation

## 2011-08-20 DIAGNOSIS — M545 Low back pain, unspecified: Secondary | ICD-10-CM | POA: Insufficient documentation

## 2011-08-20 MED ORDER — METHOCARBAMOL 750 MG PO TABS: ORAL_TABLET | ORAL | Status: DC

## 2011-08-20 MED ORDER — AZITHROMYCIN 250 MG PO TABS: ORAL_TABLET | ORAL | Status: DC

## 2011-08-20 MED ORDER — KETOROLAC TROMETHAMINE 60 MG/2ML IM SOLN
60.0000 mg | Freq: Once | INTRAMUSCULAR | Status: AC
  Administered 2011-08-20: 60 mg via INTRAMUSCULAR
  Filled 2011-08-20: qty 2

## 2011-08-20 MED ORDER — METHOCARBAMOL 500 MG PO TABS
1000.0000 mg | ORAL_TABLET | Freq: Once | ORAL | Status: AC
  Administered 2011-08-20: 1000 mg via ORAL
  Filled 2011-08-20: qty 2

## 2011-08-20 MED ORDER — OXYCODONE-ACETAMINOPHEN 5-325 MG PO TABS: 1.0000 | ORAL_TABLET | ORAL | Status: AC | PRN

## 2011-08-20 NOTE — ED Provider Notes (Signed)
Medical screening examination/treatment/procedure(s) were performed by non-physician practitioner and as supervising physician I was immediately available for consultation/collaboration.   Carleene Cooper III, MD 08/20/11 2152

## 2011-08-20 NOTE — ED Notes (Signed)
Pt reports fell  2months ago but can't see her doctor until the 24th of this month.  C/O lower back pain, left leg, ankle, and great toe pain.

## 2011-08-20 NOTE — ED Provider Notes (Signed)
History     CSN: [id number]  Arrival date & time 08/20/11  7829   First MD Initiated Contact with Patient 08/20/11 [phone]      Chief Complaint  Patient presents with  . Fall    (Consider location/radiation/quality/duration/timing/severity/associated sxs/prior treatment) HPI Comments: Patient c/o persistent lower back pain and left leg pain and tingling since a fall on 07/13/11.  Fall occurred in [place].  She states she was evaluated at another facility at the time the accident occurred, and also seen here two days later.  Reports she continues to have sharp, radiating pain to her back and left thigh, lower leg and foot.  Describes having a "pins and needles" sensation to her leg with excessive walking or standing that improves with rest.  She denies abd pain, chest pain, SOB, incontinence or urinary sx's.  She also states that she has an appt with a neurologist, Dr. Gerilyn Pilgrim for 09/01/11 but states she cannot handle the pain until then.  She has been taking OTC medications w/o relief.  Patient also c/o sinus congestion and pressure for several weeks.  States she has used OTC medications w/o relief.  Denies sore throat, dental pain, fever or facial swelling  Patient is a 66 y.o. female presenting with back pain. The history is provided by the patient.  Back Pain  This is a chronic problem. The current episode started more than 1 week ago. The problem occurs constantly. The problem has not changed since onset.The pain is associated with falling. The pain is present in the sacro-iliac joint and lumbar spine. The quality of the pain is described as shooting and aching. The pain radiates to the left thigh, left knee and left foot. The pain is moderate. The symptoms are aggravated by bending, twisting and certain positions. Associated symptoms include leg pain and tingling. Pertinent negatives include no chest pain, no fever, no numbness, no abdominal pain, no abdominal swelling, no bowel incontinence, no  perianal numbness, no bladder incontinence, no dysuria, no pelvic pain, no paresthesias, no paresis and no weakness. She has tried analgesics for the symptoms. The treatment provided no relief.    Past Medical History  Diagnosis Date  . Thyroid disease   . Hypertension     Past Surgical History  Procedure Date  . Abdominal hysterectomy     No family history on file.  History  Substance Use Topics  . Smoking status: Never Smoker   . Smokeless tobacco: Not on file  . Alcohol Use: No    OB History    Grav Para Term Preterm Abortions TAB SAB Ect Mult Living                  Review of Systems  Constitutional: Negative for fever, activity change and appetite change.  HENT: Positive for congestion, rhinorrhea, sneezing and sinus pressure. Negative for neck pain.   Respiratory: Negative for chest tightness and shortness of breath.   Cardiovascular: Negative for chest pain.  Gastrointestinal: Negative for vomiting, abdominal pain, constipation and bowel incontinence.  Genitourinary: Negative for bladder incontinence, dysuria, frequency, hematuria, flank pain, decreased urine volume, difficulty urinating and pelvic pain.       Perineal numbness or incontinence of urine or feces  Musculoskeletal: Positive for back pain. Negative for joint swelling.  Skin: Negative for rash.  Neurological: Positive for tingling. Negative for dizziness, weakness, numbness and paresthesias.  All other systems reviewed and are negative.    Allergies  Review of patient's allergies indicates no known  allergies.  Home Medications   Current Outpatient Rx  Name Route Sig Dispense Refill  . ALPRAZOLAM 0.5 MG PO TABS Oral Take 0.5 mg by mouth daily as needed. For anxiety    . AMLODIPINE BESYLATE 5 MG PO TABS Oral Take 5 mg by mouth daily.    [name] OVER THE COUNTER MEDICATION Oral Take 2 capsules by mouth daily. Cummian, to help with pain and inflammation      BP 144/133  Pulse 87  Temp 97.8 F (36.6  C) (Oral)  Resp 18  Ht 5\' 3"  (1.6 m)  Wt 295 lb (133.811 kg)  BMI 52.26 kg/m2  SpO2 98%  Physical Exam  Nursing note and vitals reviewed. Constitutional: She is oriented to person, place, and time. She appears well-developed and well-nourished. No distress.  HENT:  Head: Normocephalic and atraumatic.  Nose: Right sinus exhibits frontal sinus tenderness. Left sinus exhibits frontal sinus tenderness.  Mouth/Throat: Oropharynx is clear and moist. No oropharyngeal exudate.  Neck: Normal range of motion. Neck supple.  Cardiovascular: Normal rate, regular rhythm and intact distal pulses.   No murmur heard. Pulmonary/Chest: Effort normal and breath sounds normal.  Musculoskeletal: She exhibits tenderness. She exhibits no edema.       Lumbar back: She exhibits tenderness and pain. She exhibits normal range of motion, no swelling, no deformity, no laceration and normal pulse.       Back:  Neurological: She is alert and oriented to person, place, and time. No cranial nerve deficit or sensory deficit. She exhibits normal muscle tone. Coordination and gait normal.  Reflex Scores:      Patellar reflexes are 2+ on the right side and 2+ on the left side.      Achilles reflexes are 2+ on the right side and 2+ on the left side. Skin: Skin is warm and dry.    ED Course  Procedures (including critical care time)    Dg Lumbar Spine Complete  08/20/2011  *RADIOLOGY REPORT*  Clinical Data: Post fall on June 4 with persistent low back pain  LUMBAR SPINE - COMPLETE 4+ VIEW  Comparison: None.  Findings:  There are five non-rib bearing lumbar type vertebral bodies.  There is mild scoliotic curvature of the thoracolumbar spine, possibly positional.  No anterolisthesis or retrolisthesis.  No definite pars defects.  There is very mild (<25%) age indeterminate possible compression deformity of the anterior aspect of the superior endplate of the L4 vertebral body.  There is mild multilevel DDD, worse at L3 - L4  with disc space height loss, endplate sclerosis and primarily anteriorly directed osteophytosis.  Limited visualization of the bilateral SI joints is normal. Moderate colonic stool burden without definite evidence of obstruction.  Regional soft tissues are normal.  IMPRESSION:  1.  Age indeterminate very mild (<25%) compression deformity of the anterior aspect of the superior endplate of the L4 vertebral body. Correlation for point tenderness at this location is recommended. 2.  Mild DDD, worse at L3-L4.  Original Report Authenticated By: Waynard Reeds, M.D.     MDM    Patient was seen 07/13/11 at [facility] hospital, imaging from that visit was reviewed by me.  No previous LS spine film.  She was also seen here two days later and chart was also reviewed.    Patient has ttp of the lower lumbar spine, paraspinal muscles and left SI joint.  No focal neuro deficits on exam.  Ambulates with a cane and has a slow but steady gait.  Pt has appt with Dr. Gerilyn Pilgrim for 09/01/11.    I have discussed today's x-ray finding with the patient and EDP. Patient is feeling better, agrees to her scheduled follow-up.    The patient appears reasonably screened and/or stabilized for discharge and I doubt any other medical condition or other [facility] requiring further screening, evaluation, or treatment in the ED at this time prior to discharge.   Prescribed: Robaxin Percocet #[id number]  Helena Sardo L. Clarabell Matsuoka, PA 08/20/11 1103  Jordon Kristiansen L. [place], [place] 08/20/11 1117

## 2011-08-20 NOTE — ED Notes (Signed)
Patient requested ginger ale and crackers which was approved by nurse.  Patient advocate gave patient refreshments.

## 2011-09-16 ENCOUNTER — Encounter (HOSPITAL_COMMUNITY): Payer: Self-pay | Admitting: *Deleted

## 2011-09-16 ENCOUNTER — Emergency Department (HOSPITAL_COMMUNITY)
Admission: EM | Admit: 2011-09-16 | Discharge: 2011-09-17 | Disposition: A | Discharge status: Home / Self Care | Payer: No Typology Code available for payment source | Attending: Emergency Medicine | Admitting: Emergency Medicine

## 2011-09-16 DIAGNOSIS — E079 Disorder of thyroid, unspecified: Secondary | ICD-10-CM | POA: Insufficient documentation

## 2011-09-16 DIAGNOSIS — I10 Essential (primary) hypertension: Secondary | ICD-10-CM | POA: Insufficient documentation

## 2011-09-16 DIAGNOSIS — M549 Dorsalgia, unspecified: Secondary | ICD-10-CM | POA: Insufficient documentation

## 2011-09-16 MED ORDER — HEPARIN SOD (PORK) LOCK FLUSH 100 UNIT/ML IV SOLN
INTRAVENOUS | Status: AC
  Filled 2011-09-16: qty 5

## 2011-09-16 NOTE — ED Notes (Signed)
Larey Seat 6/4, since then has had pain low back with radiation down lt leg. Tearful at times, says she cannot do what she wants to do

## 2011-09-16 NOTE — ED Provider Notes (Signed)
History     CSN: [id number]  Arrival date & time 09/16/11  2010   First MD Initiated Contact with Patient 09/16/11 2318      No chief complaint on file.   (Consider location/radiation/quality/duration/timing/severity/associated sxs/prior treatment) HPI Comments: Pt sustained a fall July 13, 2011. Most recent xray reveal a compression fx of L4 and DDD. Pt states that she gets pain with standing or with certain activity. The pain and spasm is getting worse. Pt presents for evaluation. She was to see Dr Gerilyn Pilgrim last month, but did not have the co-pay.  Patient is a 66 y.o. female presenting with back pain. The history is provided by the patient.  Back Pain  This is a chronic problem. The current episode started yesterday. The problem occurs daily. The problem has been gradually worsening. The pain is associated with lifting heavy objects. The pain is present in the lumbar spine. The quality of the pain is described as aching. The pain radiates to the left thigh. The pain is moderate. The symptoms are aggravated by twisting and certain positions. The pain is worse during the day. Pertinent negatives include no chest pain, no abdominal pain, no bowel incontinence, no perianal numbness, no bladder incontinence and no dysuria. She has tried nothing for the symptoms. Risk factors include obesity.    Past Medical History  Diagnosis Date  . Thyroid disease   . Hypertension     Past Surgical History  Procedure Date  . Abdominal hysterectomy     History reviewed. No pertinent family history.  History  Substance Use Topics  . Smoking status: Never Smoker   . Smokeless tobacco: Not on file  . Alcohol Use: No    OB History    Grav Para Term Preterm Abortions TAB SAB Ect Mult Living                  Review of Systems  Constitutional: Negative for activity change.       All ROS Neg except as noted in HPI  HENT: Negative for nosebleeds and neck pain.   Eyes: Negative for photophobia  and discharge.  Respiratory: Negative for cough, shortness of breath and wheezing.   Cardiovascular: Negative for chest pain and palpitations.  Gastrointestinal: Negative for abdominal pain, blood in stool and bowel incontinence.  Genitourinary: Negative for bladder incontinence, dysuria, frequency and hematuria.  Musculoskeletal: Positive for back pain and arthralgias.  Skin: Negative.   Neurological: Negative for dizziness, seizures and speech difficulty.  Psychiatric/Behavioral: Negative for hallucinations and confusion.    Allergies  Review of patient's allergies indicates no known allergies.  Home Medications   Current Outpatient Rx  Name Route Sig Dispense Refill  . ALPRAZOLAM 0.5 MG PO TABS Oral Take 0.5 mg by mouth daily as needed. For anxiety    . AMLODIPINE BESYLATE 5 MG PO TABS Oral Take 5 mg by mouth daily.    . AZITHROMYCIN 250 MG PO TABS  Take two tablets on day one, then one tab qd days 2-5 6 tablet 0  . METHOCARBAMOL 750 MG PO TABS  One tab po TID prn muscle spasms 21 tablet 0  . OVER THE COUNTER MEDICATION Oral Take 2 capsules by mouth daily. Cummian, to help with pain and inflammation      BP 154/94  Pulse 83  Temp 98.1 F (36.7 C) (Oral)  Resp 20  Ht 5\' 3"  (1.6 m)  Wt 200 lb (90.719 kg)  BMI 35.43 kg/m2  SpO2 100%  Physical  Exam  Nursing note and vitals reviewed. Constitutional: She is oriented to person, place, and time. She appears well-developed and well-nourished.  Non-toxic appearance.  HENT:  Head: Normocephalic.  Right Ear: Tympanic membrane and external ear normal.  Left Ear: Tympanic membrane and external ear normal.  Eyes: EOM and lids are normal. Pupils are equal, round, and reactive to light.  Neck: Normal range of motion. Neck supple. Carotid bruit is not present.  Cardiovascular: Normal rate, regular rhythm, normal heart sounds, intact distal pulses and normal pulses.   Pulmonary/Chest: Breath sounds normal. No respiratory distress.    Abdominal: Soft. Bowel sounds are normal. There is no tenderness. There is no guarding.  Musculoskeletal: Normal range of motion.       Lumbar area pain to palpation and attempted ROM. No palpable step off.   Lymphadenopathy:       Head (right side): No submandibular adenopathy present.       Head (left side): No submandibular adenopathy present.    She has no cervical adenopathy.  Neurological: She is alert and oriented to person, place, and time. She has normal strength. No cranial nerve deficit or sensory deficit.       Sensory of the lower extremities wnl. Back pain with SLR or the right.  Skin: Skin is warm and dry.  Psychiatric: She has a normal mood and affect. Her speech is normal.    ED Course  Procedures (including critical care time)  Labs Reviewed - No data to display No results found.   No diagnosis found.    MDM  I have reviewed nursing notes, vital signs, and all appropriate lab and imaging results for this patient. Pt has chronic pain related to DDD and lumbar compression fracture. No acute deficit noted on exam. Pt treated with Dexamethasone, robaxin,and morphine in ED. Rx for norco 7.5mg  given to the patient.       Kathie Dike, [place] 09/16/11 2356

## 2011-09-17 MED ORDER — MORPHINE SULFATE 4 MG/ML IJ SOLN
8.0000 mg | Freq: Once | INTRAMUSCULAR | Status: AC
  Administered 2011-09-17: 8 mg via INTRAMUSCULAR
  Filled 2011-09-17: qty 2

## 2011-09-17 MED ORDER — ONDANSETRON HCL 4 MG PO TABS
4.0000 mg | ORAL_TABLET | Freq: Once | ORAL | Status: AC
  Administered 2011-09-17: 4 mg via ORAL
  Filled 2011-09-17: qty 1

## 2011-09-17 MED ORDER — DEXAMETHASONE 6 MG PO TABS: ORAL_TABLET | ORAL | Status: AC

## 2011-09-17 MED ORDER — METHOCARBAMOL 500 MG PO TABS
500.0000 mg | ORAL_TABLET | Freq: Once | ORAL | Status: AC
  Administered 2011-09-17: 500 mg via ORAL
  Filled 2011-09-17: qty 1

## 2011-09-17 MED ORDER — HYDROCODONE-ACETAMINOPHEN 7.5-325 MG PO TABS: 1.0000 | ORAL_TABLET | ORAL | Status: AC | PRN

## 2011-09-17 MED ORDER — DEXAMETHASONE SODIUM PHOSPHATE 4 MG/ML IJ SOLN
8.0000 mg | Freq: Once | INTRAMUSCULAR | Status: AC
  Administered 2011-09-17: 8 mg via INTRAMUSCULAR
  Filled 2011-09-17: qty 2

## 2011-09-17 NOTE — ED Provider Notes (Signed)
Medical screening examination/treatment/procedure(s) were performed by non-physician practitioner and as supervising physician I was immediately available for consultation/collaboration.  Korena Nass, MD 09/17/11 0403 

## 2011-09-30 ENCOUNTER — Encounter (HOSPITAL_COMMUNITY): Payer: Self-pay

## 2011-09-30 NOTE — H&P (Signed)
  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  Seen by Endocrinology, Dr. Fransico Him, who needs total thyroidectomy.  No heat intolerance, weight loss/gain, irradiation, voice change, airway compromise, dysphagia.  U/S of thyroid reviewed.  Review of Symptoms:  Constitutional:unremarkable   Head:unremarkable    Eyes:unremarkable   sinus Cardiovascular:  unremarkable   Respiratory:unremarkable   Gastrointestinal:  unremarkable   Genitourinary:unremarkable       joint, back, neck pain Skin:unremarkable Hematolgic/Lymphatic:unremarkable     Allergic/Immunologic:unremarkable     Past Medical History:    Reviewed   Past Medical History  Surgical History: TAH-BSO Medical Problems:  High Blood pressure Allergies: nkda Medications: amlodipine   Social History:Reviewed  Social History  Preferred Language: English ([place]) Ethnicity: Not Hispanic / Latino Age: 66 Years 6 Months Alcohol: no Recreational drug(s):  No   Smoking Status: Never smoker reviewed on 09/30/2011  Family History:  Reviewed   Family History  Is there a family history of:No h/o thyroid cancer             Father:  Cancer    Objective Information: General:  Well appearing, well nourished in no distress. Throat:  no erythema, exudates or lesions.   Large nodular thyroid gland, R>>L.  No lymphadenopathy. Heart:  RRR, no murmur Lungs:    CTA bilaterally, no wheezes, rhonchi, rales.  Breathing unlabored.  Assessment:Multinodular goiter  Diagnosis &amp; Procedure: DiagnosisCode: 241.1, ProcedureCode: 16109,    Plan:Scheduled for total thyroidectomy on 10/08/11.  Risk  including nerve injury and possibility of cancer explained to the patient.   Patient Education:Alternative treatments to surgery were discussed with patient (and family).  Risks and benefits  of procedure were fully explained to the patient (and family) who gave informed consent. Patient/family questions were addressed.  Follow-up:Pending Surgery

## 2011-10-01 ENCOUNTER — Other Ambulatory Visit (HOSPITAL_COMMUNITY): Payer: Medicare Other

## 2011-10-01 NOTE — Patient Instructions (Addendum)
Your procedure is scheduled on:  10/08/2011  Report to [facility] at 8:30     AM.  Call this number if you have problems the morning of surgery: [phone]   Remember:   Do not drink or eat food:After Midnight.    .  Take these medicines the morning of surgery with A SIP OF WATER: Norvasc   Do not wear jewelry, make-up or nail polish.  Do not wear lotions, powders, or perfumes. You may wear deodorant.  Do not shave 48 hours prior to surgery.  Do not bring valuables to the hospital.  Contacts, dentures or bridgework may not be worn into surgery.  Leave suitcase in the car. After surgery it may be brought to your room.  For patients admitted to the hospital, checkout time is 11:00 AM the day of discharge.   Patients discharged the day of surgery will not be allowed to drive home.  Name and phone number of your driver:   Special Instructions: CHG Shower use special wash: 1/2 bottle night before surgery and 1/2 bottle morning of surgery.   Please read over the following fact sheets that you were given: Pain Booklet, MRSA  Surgical Site Infection Prevention, Anesthesia Post-op Instructions and Care and Recovery After Surgery   Thyroidectomy Care After Refer to this sheet in the next few weeks. These instructions provide you with general information on caring for yourself after you leave the hospital. Your caregiver also may give you specific instructions. Your treatment has been planned according to the most current medical practices available, but problems sometimes occur. Call your caregiver if you have any problems or questions after your procedure. HOME CARE INSTRUCTIONS   It is normal to be sore for a few weeks following surgery. See your caregiver if your pain seems to be getting worse rather than better.   Only take over-the-counter or prescription medicines for pain, discomfort, or fever as directed by your caregiver. Avoid taking medicines that contain aspirin and ibuprofen because  they increase the risk of bleeding.   Shower rather than bathe until instructed otherwise by your caregiver.   Change your bandages (dressings) as directed by your caregiver.   You may resume a normal diet and activities as directed by your caregiver.   Avoid lifting weight greater than 20 lb (9 kg) or participating in heavy exercise or contact sports for 10 days or as instructed by your caregiver.   Make an appointment to see your caregiver for stitch (suture) or staple removal.  SEEK MEDICAL CARE IF:   You have increased bleeding from your wound.   You have redness, swelling, or increasing pain from your wound or in your neck.   There is pus coming from your wound.   You have an oral temperature above 102 F (38.9 C).   There is a bad smell coming from the wound or dressing.   You develop lightheadedness or feel faint.   You develop numbness, tingling, or muscle spasms in your arms, hands, feet, or face.   You have difficulty swallowing.  SEEK IMMEDIATE MEDICAL CARE IF:   You develop a rash.   You have difficulty breathing.   You hear whistling noises that come from your chest.   You develop a cough that becomes increasingly worse.   You develop any reaction or side effects to medicines given.   There is swelling in your neck.   You develop changes in speech or hoarseness, which is getting worse.  MAKE SURE YOU:  Understand these instructions.   Will watch your condition.   Will get help right away if you are not doing well or get worse.  Document Released: 08/14/2004 Document Revised: 01/14/2011 Document Reviewed: 04/03/2010 [facility] Patient Information 2012 [place], [place]. Instructions Following General Anesthetic, Adult A nurse specialized in giving anesthesia (anesthetist) or a doctor specialized in giving anesthesia (anesthesiologist) gave you a medicine that made you sleep while a procedure was performed. For as long as 24 hours following this  procedure, you may feel:  Dizzy.   Weak.   Drowsy.  AFTER THE PROCEDURE After surgery, you will be taken to the recovery area where a nurse will monitor your progress. You will be allowed to go home when you are awake, stable, taking fluids well, and without complications. For the first 24 hours following an anesthetic:  Have a responsible person with you.   Do not drive a car. If you are alone, do not take public transportation.   Do not drink alcohol.   Do not take medicine that has not been prescribed by your caregiver.   Do not sign important papers or make important decisions.   You may resume normal diet and activities as directed.   Change bandages (dressings) as directed.   Only take over-the-counter or prescription medicines for pain, discomfort, or fever as directed by your caregiver.  If you have questions or problems that seem related to the anesthetic, call the hospital and ask for the anesthetist or anesthesiologist on call. SEEK IMMEDIATE MEDICAL CARE IF:   You develop a rash.   You have difficulty breathing.   You have chest pain.   You develop any allergic problems.  Document Released: 05/03/2000 Document Revised: 01/14/2011 Document Reviewed: 12/12/2006 [facility] Patient Information 2012 [place], [place].

## 2011-10-04 ENCOUNTER — Encounter (HOSPITAL_COMMUNITY)
Admission: RE | Admit: 2011-10-04 | Discharge: 2011-10-04 | Disposition: A | Discharge status: Home / Self Care | Payer: Medicare Other | Source: Ambulatory Visit | Attending: General Surgery | Admitting: General Surgery

## 2011-10-04 ENCOUNTER — Encounter (HOSPITAL_COMMUNITY): Payer: Self-pay

## 2011-10-04 HISTORY — DX: Sleep apnea, unspecified: G47.30

## 2011-10-04 HISTORY — DX: Anxiety disorder, unspecified: F41.9

## 2011-10-04 LAB — CBC WITH DIFFERENTIAL/PLATELET
Basophils Absolute: 0 10*3/uL (ref 0.0–0.1)
Basophils Relative: 0 % (ref 0–1)
Eosinophils Absolute: 0.2 10*3/uL (ref 0.0–0.7)
Lymphs Abs: 1.7 10*3/uL (ref 0.7–4.0)
MCH: 30.1 pg (ref 26.0–34.0)
MCHC: 33.3 g/dL (ref 30.0–36.0)
Neutrophils Relative %: 52 % (ref 43–77)
Platelets: 321 10*3/uL (ref 150–400)
RBC: 4.85 MIL/uL (ref 3.87–5.11)
RDW: 14.8 % (ref 11.5–15.5)

## 2011-10-04 LAB — COMPREHENSIVE METABOLIC PANEL
ALT: 19 U/L (ref 0–35)
AST: 21 U/L (ref 0–37)
Albumin: 3.7 g/dL (ref 3.5–5.2)
Alkaline Phosphatase: 77 U/L (ref 39–117)
Potassium: 3.7 mEq/L (ref 3.5–5.1)
Sodium: 141 mEq/L (ref 135–145)
Total Protein: 7 g/dL (ref 6.0–8.3)

## 2011-10-04 LAB — SURGICAL PCR SCREEN: MRSA, PCR: NEGATIVE

## 2011-10-04 NOTE — Progress Notes (Signed)
10/04/11 1100  OBSTRUCTIVE SLEEP APNEA  Have you ever been diagnosed with sleep apnea through a sleep study? No  If yes, do you have and use a CPAP or BPAP machine every night? 0  Do you snore loudly (loud enough to be heard through closed doors)?  1  Do you often feel tired, fatigued, or sleepy during the daytime? 0  Has anyone observed you stop breathing during your sleep? 0  Do you have, or are you being treated for high blood pressure? 1  BMI more than 35 kg/m2? 1  Age over 68 years old? 1  Neck circumference greater than 40 cm/18 inches? 0  Gender: 0  Obstructive Sleep Apnea Score 4   Score 4 or greater  Updated health history;Results sent to PCP

## 2011-10-08 ENCOUNTER — Inpatient Hospital Stay (HOSPITAL_COMMUNITY): Payer: Medicare Other | Admitting: Anesthesiology

## 2011-10-08 ENCOUNTER — Encounter (HOSPITAL_COMMUNITY): Payer: Self-pay | Admitting: *Deleted

## 2011-10-08 ENCOUNTER — Encounter (HOSPITAL_COMMUNITY)
Admission: RE | Disposition: A | Discharge status: Home / Self Care | Payer: Self-pay | Source: Ambulatory Visit | Attending: General Surgery

## 2011-10-08 ENCOUNTER — Ambulatory Visit (HOSPITAL_COMMUNITY)
Admission: RE | Admit: 2011-10-08 | Discharge: 2011-10-09 | DRG: 627 | Disposition: A | Discharge status: Home / Self Care | Payer: Medicare Other | Source: Ambulatory Visit | Attending: General Surgery | Admitting: General Surgery

## 2011-10-08 ENCOUNTER — Encounter (HOSPITAL_COMMUNITY): Payer: Self-pay | Admitting: Anesthesiology

## 2011-10-08 DIAGNOSIS — I2510 Atherosclerotic heart disease of native coronary artery without angina pectoris: Secondary | ICD-10-CM | POA: Insufficient documentation

## 2011-10-08 DIAGNOSIS — Z9889 Other specified postprocedural states: Secondary | ICD-10-CM

## 2011-10-08 DIAGNOSIS — I252 Old myocardial infarction: Secondary | ICD-10-CM | POA: Insufficient documentation

## 2011-10-08 DIAGNOSIS — Z23 Encounter for immunization: Secondary | ICD-10-CM

## 2011-10-08 DIAGNOSIS — F411 Generalized anxiety disorder: Secondary | ICD-10-CM | POA: Insufficient documentation

## 2011-10-08 DIAGNOSIS — I10 Essential (primary) hypertension: Secondary | ICD-10-CM | POA: Insufficient documentation

## 2011-10-08 DIAGNOSIS — G4730 Sleep apnea, unspecified: Secondary | ICD-10-CM | POA: Insufficient documentation

## 2011-10-08 DIAGNOSIS — E042 Nontoxic multinodular goiter: Secondary | ICD-10-CM | POA: Diagnosis present

## 2011-10-08 DIAGNOSIS — E890 Postprocedural hypothyroidism: Secondary | ICD-10-CM

## 2011-10-08 HISTORY — PX: THYROIDECTOMY: SHX17

## 2011-10-08 LAB — COMPREHENSIVE METABOLIC PANEL
BUN: 8 mg/dL (ref 6–23)
CO2: 27 mEq/L (ref 19–32)
Chloride: 104 mEq/L (ref 96–112)
Creatinine, Ser: 0.7 mg/dL (ref 0.50–1.10)
GFR calc non Af Amer: 89 mL/min — ABNORMAL LOW (ref >90)
Glucose, Bld: 153 mg/dL — ABNORMAL HIGH (ref 70–99)
Total Bilirubin: 0.4 mg/dL (ref 0.3–1.2)

## 2011-10-08 SURGERY — THYROIDECTOMY
Anesthesia: General | Site: Neck | Wound class: Clean

## 2011-10-08 MED ORDER — SUCCINYLCHOLINE CHLORIDE 20 MG/ML IJ SOLN
INTRAMUSCULAR | Status: AC
  Filled 2011-10-08: qty 1

## 2011-10-08 MED ORDER — ENOXAPARIN SODIUM 40 MG/0.4ML ~~LOC~~ SOLN
40.0000 mg | SUBCUTANEOUS | Status: DC
  Administered 2011-10-09: 40 mg via SUBCUTANEOUS
  Filled 2011-10-08: qty 0.4

## 2011-10-08 MED ORDER — ROCURONIUM BROMIDE 100 MG/10ML IV SOLN
INTRAVENOUS | Status: DC | PRN
  Administered 2011-10-08: 30 mg via INTRAVENOUS
  Administered 2011-10-08: 10 mg via INTRAVENOUS

## 2011-10-08 MED ORDER — LEVOTHYROXINE SODIUM 150 MCG PO TABS: 150.0000 ug | ORAL_TABLET | Freq: Every day | ORAL | Status: DC

## 2011-10-08 MED ORDER — ONDANSETRON HCL 4 MG/2ML IJ SOLN
4.0000 mg | Freq: Four times a day (QID) | INTRAMUSCULAR | Status: DC | PRN
  Administered 2011-10-08: 4 mg via INTRAVENOUS
  Filled 2011-10-08: qty 2

## 2011-10-08 MED ORDER — BUPIVACAINE HCL (PF) 0.5 % IJ SOLN
INTRAMUSCULAR | Status: AC
  Filled 2011-10-08: qty 30

## 2011-10-08 MED ORDER — ENOXAPARIN SODIUM 40 MG/0.4ML ~~LOC~~ SOLN
SUBCUTANEOUS | Status: AC
  Filled 2011-10-08: qty 0.4

## 2011-10-08 MED ORDER — GLYCOPYRROLATE 0.2 MG/ML IJ SOLN
INTRAMUSCULAR | Status: AC
  Filled 2011-10-08: qty 2

## 2011-10-08 MED ORDER — MIDAZOLAM HCL 2 MG/2ML IJ SOLN
1.0000 mg | INTRAMUSCULAR | Status: DC | PRN
  Administered 2011-10-08 (×2): 2 mg via INTRAVENOUS

## 2011-10-08 MED ORDER — ONDANSETRON HCL 4 MG/2ML IJ SOLN
4.0000 mg | Freq: Once | INTRAMUSCULAR | Status: AC
  Administered 2011-10-08: 4 mg via INTRAVENOUS

## 2011-10-08 MED ORDER — FENTANYL CITRATE 0.05 MG/ML IJ SOLN
INTRAMUSCULAR | Status: DC | PRN
  Administered 2011-10-08: 50 ug via INTRAVENOUS
  Administered 2011-10-08: 100 ug via INTRAVENOUS
  Administered 2011-10-08 (×2): 50 ug via INTRAVENOUS

## 2011-10-08 MED ORDER — HYDROCODONE-ACETAMINOPHEN 5-325 MG PO TABS: 1.0000 | ORAL_TABLET | ORAL | Status: DC | PRN

## 2011-10-08 MED ORDER — HYDROMORPHONE HCL PF 1 MG/ML IJ SOLN
1.0000 mg | INTRAMUSCULAR | Status: DC | PRN
  Administered 2011-10-08 – 2011-10-09 (×5): 1 mg via INTRAVENOUS
  Filled 2011-10-08 (×5): qty 1

## 2011-10-08 MED ORDER — ONDANSETRON HCL 4 MG/2ML IJ SOLN
INTRAMUSCULAR | Status: AC
  Filled 2011-10-08: qty 2

## 2011-10-08 MED ORDER — FENTANYL CITRATE 0.05 MG/ML IJ SOLN
INTRAMUSCULAR | Status: AC
  Filled 2011-10-08: qty 5

## 2011-10-08 MED ORDER — MENTHOL 3 MG MT LOZG
1.0000 | LOZENGE | OROMUCOSAL | Status: DC | PRN
  Filled 2011-10-08: qty 9

## 2011-10-08 MED ORDER — LEVOTHYROXINE SODIUM 75 MCG PO TABS
150.0000 ug | ORAL_TABLET | Freq: Every day | ORAL | Status: DC
Start: 2011-10-09 — End: 2011-10-09
  Administered 2011-10-09: 150 ug via ORAL
  Filled 2011-10-08: qty 2

## 2011-10-08 MED ORDER — MIDAZOLAM HCL 2 MG/2ML IJ SOLN
INTRAMUSCULAR | Status: AC
  Filled 2011-10-08: qty 2

## 2011-10-08 MED ORDER — FENTANYL CITRATE 0.05 MG/ML IJ SOLN
INTRAMUSCULAR | Status: AC
  Filled 2011-10-08: qty 2

## 2011-10-08 MED ORDER — PNEUMOCOCCAL VAC POLYVALENT 25 MCG/0.5ML IJ INJ
0.5000 mL | INJECTION | INTRAMUSCULAR | Status: DC
  Filled 2011-10-08: qty 0.5

## 2011-10-08 MED ORDER — ONDANSETRON HCL 4 MG PO TABS: 4.0000 mg | ORAL_TABLET | Freq: Four times a day (QID) | ORAL | Status: DC | PRN

## 2011-10-08 MED ORDER — DEXAMETHASONE SODIUM PHOSPHATE 4 MG/ML IJ SOLN
4.0000 mg | Freq: Once | INTRAMUSCULAR | Status: AC
  Administered 2011-10-08: 4 mg via INTRAVENOUS

## 2011-10-08 MED ORDER — SUCCINYLCHOLINE CHLORIDE 20 MG/ML IJ SOLN
INTRAMUSCULAR | Status: DC | PRN
  Administered 2011-10-08: 120 mg via INTRAVENOUS

## 2011-10-08 MED ORDER — CHLORHEXIDINE GLUCONATE 4 % EX LIQD: 1.0000 "application " | Freq: Once | CUTANEOUS | Status: AC

## 2011-10-08 MED ORDER — CALCIUM CARBONATE-VITAMIN D 500-200 MG-UNIT PO TABS
2.0000 | ORAL_TABLET | Freq: Two times a day (BID) | ORAL | Status: DC
  Administered 2011-10-08 – 2011-10-09 (×3): 2 via ORAL
  Filled 2011-10-08 (×2): qty 2
  Filled 2011-10-08 (×2): qty 1

## 2011-10-08 MED ORDER — PROPOFOL 10 MG/ML IV EMUL
INTRAVENOUS | Status: AC
  Filled 2011-10-08: qty 20

## 2011-10-08 MED ORDER — ROCURONIUM BROMIDE 50 MG/5ML IV SOLN
INTRAVENOUS | Status: AC
  Filled 2011-10-08: qty 1

## 2011-10-08 MED ORDER — AMLODIPINE BESYLATE 5 MG PO TABS
5.0000 mg | ORAL_TABLET | Freq: Every day | ORAL | Status: DC
  Administered 2011-10-08 – 2011-10-09 (×2): 5 mg via ORAL
  Filled 2011-10-08 (×2): qty 1

## 2011-10-08 MED ORDER — ONDANSETRON HCL 4 MG/2ML IJ SOLN: 4.0000 mg | Freq: Once | INTRAMUSCULAR | Status: DC | PRN

## 2011-10-08 MED ORDER — ALPRAZOLAM 0.5 MG PO TABS
0.5000 mg | ORAL_TABLET | Freq: Three times a day (TID) | ORAL | Status: DC | PRN
  Administered 2011-10-08 – 2011-10-09 (×2): 0.5 mg via ORAL
  Filled 2011-10-08 (×2): qty 1

## 2011-10-08 MED ORDER — PROPOFOL 10 MG/ML IV BOLUS
INTRAVENOUS | Status: DC | PRN
  Administered 2011-10-08: 20 mg via INTRAVENOUS
  Administered 2011-10-08: 150 mg via INTRAVENOUS

## 2011-10-08 MED ORDER — GLYCOPYRROLATE 0.2 MG/ML IJ SOLN
INTRAMUSCULAR | Status: DC | PRN
  Administered 2011-10-08: 0.2 mg via INTRAVENOUS
  Administered 2011-10-08: 0.4 mg via INTRAVENOUS

## 2011-10-08 MED ORDER — FENTANYL CITRATE 0.05 MG/ML IJ SOLN
25.0000 ug | INTRAMUSCULAR | Status: DC | PRN
  Administered 2011-10-08 (×4): 50 ug via INTRAVENOUS

## 2011-10-08 MED ORDER — NEOSTIGMINE METHYLSULFATE 1 MG/ML IJ SOLN
INTRAMUSCULAR | Status: DC | PRN
  Administered 2011-10-08: 2 mg via INTRAVENOUS

## 2011-10-08 MED ORDER — GLYCOPYRROLATE 0.2 MG/ML IJ SOLN
INTRAMUSCULAR | Status: AC
  Filled 2011-10-08: qty 1

## 2011-10-08 MED ORDER — HEMOSTATIC AGENTS (NO CHARGE) OPTIME
TOPICAL | Status: DC | PRN
  Administered 2011-10-08: 1 via TOPICAL

## 2011-10-08 MED ORDER — ACETAMINOPHEN 10 MG/ML IV SOLN
1000.0000 mg | Freq: Four times a day (QID) | INTRAVENOUS | Status: AC
  Administered 2011-10-08 – 2011-10-09 (×4): 1000 mg via INTRAVENOUS
  Filled 2011-10-08 (×4): qty 100

## 2011-10-08 MED ORDER — SODIUM CHLORIDE 0.9 % IR SOLN
Status: DC | PRN
  Administered 2011-10-08: 1000 mL

## 2011-10-08 MED ORDER — BUPIVACAINE HCL (PF) 0.5 % IJ SOLN
INTRAMUSCULAR | Status: DC | PRN
  Administered 2011-10-08: 4 mL

## 2011-10-08 MED ORDER — DEXAMETHASONE SODIUM PHOSPHATE 4 MG/ML IJ SOLN
INTRAMUSCULAR | Status: AC
  Filled 2011-10-08: qty 1

## 2011-10-08 MED ORDER — LACTATED RINGERS IV SOLN
INTRAVENOUS | Status: DC
  Administered 2011-10-08: 15:00:00 via INTRAVENOUS

## 2011-10-08 MED ORDER — LIDOCAINE HCL (CARDIAC) 10 MG/ML IV SOLN
INTRAVENOUS | Status: DC | PRN
  Administered 2011-10-08: 10 mg via INTRAVENOUS

## 2011-10-08 MED ORDER — LACTATED RINGERS IV SOLN
INTRAVENOUS | Status: DC
  Administered 2011-10-08: 1000 mL via INTRAVENOUS

## 2011-10-08 MED ORDER — ENOXAPARIN SODIUM 40 MG/0.4ML ~~LOC~~ SOLN
40.0000 mg | Freq: Once | SUBCUTANEOUS | Status: AC
  Administered 2011-10-08: 40 mg via SUBCUTANEOUS

## 2011-10-08 SURGICAL SUPPLY — 67 items
ADH SKN CLS APL DERMABOND .7 (GAUZE/BANDAGES/DRESSINGS) ×1
APPLIER CLIP 11 MED OPEN (CLIP) ×2
APPLIER CLIP 9.375 SM OPEN (CLIP) ×6
APR CLP MED 11 20 MLT OPN (CLIP) ×1
APR CLP SM 9.3 20 MLT OPN (CLIP) ×3
ATTRACTOMAT 16X20 MAGNETIC DRP (DRAPES) ×2 IMPLANT
BAG HAMPER (MISCELLANEOUS) ×2 IMPLANT
BLADE SURG 15 STRL LF DISP TIS (BLADE) ×1 IMPLANT
BLADE SURG 15 STRL SS (BLADE) ×2
BLADE SURG SZ10 CARB STEEL (BLADE) ×2 IMPLANT
CLIP APPLIE 11 MED OPEN (CLIP) ×1 IMPLANT
CLIP APPLIE 9.375 SM OPEN (CLIP) ×1 IMPLANT
CLOTH BEACON ORANGE TIMEOUT ST (SAFETY) ×2 IMPLANT
COVER LIGHT HANDLE STERIS (MISCELLANEOUS) ×4 IMPLANT
DERMABOND ADVANCED (GAUZE/BANDAGES/DRESSINGS) ×1
DERMABOND ADVANCED .7 DNX12 (GAUZE/BANDAGES/DRESSINGS) ×1 IMPLANT
DRAPE PED LAPAROTOMY (DRAPES) ×2 IMPLANT
DRAPE PROXIMA HALF (DRAPES) ×4 IMPLANT
DURAPREP 26ML APPLICATOR (WOUND CARE) ×2 IMPLANT
ELECT CAUTERY BLADE 6.4 (BLADE) ×1 IMPLANT
ELECT NDL TIP 2.8 STRL (NEEDLE) ×1 IMPLANT
ELECT NEEDLE TIP 2.8 STRL (NEEDLE) ×2 IMPLANT
ELECT REM PT RETURN 9FT ADLT (ELECTROSURGICAL) ×2
ELECTRODE REM PT RTRN 9FT ADLT (ELECTROSURGICAL) ×1 IMPLANT
FORMALIN 10 PREFIL 120ML (MISCELLANEOUS) ×4 IMPLANT
GAUZE SPONGE 4X4 16PLY XRAY LF (GAUZE/BANDAGES/DRESSINGS) ×4 IMPLANT
GLOVE BIO SURGEON STRL SZ7.5 (GLOVE) ×3 IMPLANT
GLOVE BIOGEL PI IND STRL 7.0 (GLOVE) IMPLANT
GLOVE BIOGEL PI IND STRL 7.5 (GLOVE) IMPLANT
GLOVE BIOGEL PI INDICATOR 7.0 (GLOVE) ×1
GLOVE BIOGEL PI INDICATOR 7.5 (GLOVE) ×1
GLOVE ECLIPSE 6.5 STRL STRAW (GLOVE) ×1 IMPLANT
GLOVE ECLIPSE 7.0 STRL STRAW (GLOVE) ×1 IMPLANT
GOWN STRL REIN XL XLG (GOWN DISPOSABLE) ×7 IMPLANT
HEMOSTAT SURGICEL 2X3 (HEMOSTASIS) ×1 IMPLANT
HEMOSTAT SURGICEL 4X8 (HEMOSTASIS) ×1 IMPLANT
KIT BLADEGUARD II DBL (SET/KITS/TRAYS/PACK) ×2 IMPLANT
KIT ROOM TURNOVER APOR (KITS) ×2 IMPLANT
MANIFOLD NEPTUNE II (INSTRUMENTS) ×2 IMPLANT
MARKER SKIN DUAL TIP RULER LAB (MISCELLANEOUS) ×2 IMPLANT
NDL HYPO 25X1 1.5 SAFETY (NEEDLE) ×1 IMPLANT
NEEDLE HYPO 25X1 1.5 SAFETY (NEEDLE) ×2 IMPLANT
NS IRRIG 1000ML POUR BTL (IV SOLUTION) ×2 IMPLANT
PACK BASIC III (CUSTOM PROCEDURE TRAY) ×2
PACK SRG BSC III STRL LF ECLPS (CUSTOM PROCEDURE TRAY) ×1 IMPLANT
PAD ARMBOARD 7.5X6 YLW CONV (MISCELLANEOUS) ×2 IMPLANT
PENCIL HANDSWITCHING (ELECTRODE) ×2 IMPLANT
SET BASIN LINEN APH (SET/KITS/TRAYS/PACK) ×2 IMPLANT
SHEARS HARMONIC 9CM CVD (BLADE) IMPLANT
SPONGE GAUZE 2X2 8PLY STRL LF (GAUZE/BANDAGES/DRESSINGS) ×1 IMPLANT
SPONGE INTESTINAL PEANUT (DISPOSABLE) ×13 IMPLANT
STAPLER VISISTAT (STAPLE) ×2 IMPLANT
SUT ETHILON 3 0 FSL (SUTURE) ×2 IMPLANT
SUT ETHILON 4 0 PS 2 18 (SUTURE) ×2 IMPLANT
SUT SILK 2 0 (SUTURE) ×2
SUT SILK 2-0 18XBRD TIE 12 (SUTURE) ×1 IMPLANT
SUT SILK 3 0 (SUTURE)
SUT SILK 3-0 18XBRD TIE 12 (SUTURE) IMPLANT
SUT VIC AB 2-0 CT2 27 (SUTURE) ×1 IMPLANT
SUT VIC AB 3-0 SH 27 (SUTURE) ×2
SUT VIC AB 3-0 SH 27X BRD (SUTURE) ×1 IMPLANT
SUT VIC AB 4-0 PS2 27 (SUTURE) ×4 IMPLANT
SYR CONTROL 10ML LL (SYRINGE) ×2 IMPLANT
SYSTEM CHEST DRAIN TLS 7FR (DRAIN) ×2 IMPLANT
TAPE CLOTH SURG 4X10 WHT LF (GAUZE/BANDAGES/DRESSINGS) ×1 IMPLANT
TOWEL OR 17X26 4PK STRL BLUE (TOWEL DISPOSABLE) IMPLANT
YANKAUER SUCT BULB TIP 10FT TU (MISCELLANEOUS) ×2 IMPLANT

## 2011-10-08 NOTE — Progress Notes (Signed)
Awake. Able to say E. Speech clear. Rates pain 5. Returns to sleep easily. scd on.

## 2011-10-08 NOTE — Op Note (Signed)
Patient:  [name]  DOB:  04/22/1945  MRN:  [id number]   Preop Diagnosis:  Multinodular goiter  Postop Diagnosis:  Same  Procedure:  Total thyroidectomy  Surgeon:  Franky Macho, M.D.  Assistant: Tilford Pillar, M.D.  Anes:  General endotracheal  Indications:  Patient is a 66 year old white female with a multinodular goiter which has increased in size. She has been referred by her endocrinologist for a total thyroidectomy. The risks and benefits of the procedure including bleeding, infection, nerve injury, the possibly malignancy were fully explained to the patient, gave informed consent.  Procedure note:  Patient's placed in the supine position. After induction of general endotracheal anesthesia, the head and neck were extended and the neck was prepped using DuraPrep. Surgical site confirmation was performed.  A transverse incision was made 1 fingerbreadth above the jugular notch. The platysma was divided without difficulty. The dissection was taken to the strap muscle. The strap muscles were longitudinally separated along its midline and both were retracted laterally. We began our dissection around the left lobe of the thyroid. Multiple nodules were noted, especially along the inferior pole. The middle thyroidal artery and vein were ligated and divided using small clips. This was likewise done to the inferior thyroidal artery. The suspensory ligament of Gery Pray was also ligated and divided using clips. Care was taken to avoid the parathyroid glands which were both identified. The left lobe was then rolled out of its bed lateral to medial towards the trachea. It was freed away from the trachea using Bovie electrocautery and sharp dissection. The recurrent laryngeal nerve was encountered. A similar dissection was done on the right lobe of the thyroid gland. Once the thyroid gland was removed, a single suture was placed in the left lobe of the thyroid gland and then the specimen was sent to  pathology further examination. Surgicel was placed in both thyroid lobes meds. A #5 round drain was then placed in the both thyroid beds and brought through separate stab wound to the left inferior of the incision line. The strap muscle was reapproximated using a 3-0 Vicryl running suture. The platysma was reapproximated using 3-0 Vicryl running suture. The skin was closed using a 4-0 Vicryl subcuticular suture. 0.5% Sensorcaine was instilled the surrounding wound. Dermabond was then applied.  All tape and needle counts were correct at the end of the procedure. The patient was extubated in the operating room and went back to PACU in stable condition. She was able to phonate the letter E.  Complications:  None  EBL:  10 cc  Specimen:  Thyroid gland  Drains: JP to thyroid bed

## 2011-10-08 NOTE — Interval H&P Note (Signed)
History and Physical Interval Note:  10/08/2011 8:58 AM  [name] [name]  has presented today for surgery, with the diagnosis of multinodular goiter  The various methods of treatment have been discussed with the patient and family. After consideration of risks, benefits and other options for treatment, the patient has consented to  Procedure(s) (LRB): THYROIDECTOMY (N/A) as a surgical intervention .  The patient's history has been reviewed, patient examined, no change in status, stable for surgery.  I have reviewed the patient's chart and labs.  Questions were answered to the patient's satisfaction.     Franky Macho A

## 2011-10-08 NOTE — Progress Notes (Signed)
Awake. O2 changed to nasal cannula as ordered. Able to say E. Resting quietly.

## 2011-10-08 NOTE — Progress Notes (Signed)
Arouses to name. Able to say E without difficulty.

## 2011-10-08 NOTE — Transfer of Care (Signed)
Immediate Anesthesia Transfer of Care Note  Patient: [name] [name]  Procedure(s) Performed: Procedure(s) (LRB): THYROIDECTOMY (N/A)  Patient Location: PACU  Anesthesia Type: General  Level of Consciousness: awake  Airway & Oxygen Therapy: Patient Spontanous Breathing and non-rebreather face mask  Post-op Assessment: Report given to PACU RN, Post -op Vital signs reviewed and stable and Patient moving all extremities  Post vital signs: Reviewed and stable  Complications: No apparent anesthesia complications

## 2011-10-08 NOTE — Progress Notes (Signed)
B/P 186/91, rt regular cuff. B/P cuff changed to large adult. B/P 150/63.

## 2011-10-08 NOTE — Progress Notes (Signed)
Awake. C/O postop neck pain. Rates pain 9. Med as noted.

## 2011-10-08 NOTE — Anesthesia Preprocedure Evaluation (Addendum)
Anesthesia Evaluation  Patient identified by MRN, date of birth, ID band Patient awake    Reviewed: Allergy & Precautions, H&P , NPO status , Patient's Chart, lab work & pertinent test results  History of Anesthesia Complications Negative for: history of anesthetic complications  Airway Mallampati: II TM Distance: >3 FB Neck ROM: Full    Dental  (+) Teeth Intact   Pulmonary sleep apnea (pos screen, no prior hx) ,  breath sounds clear to auscultation        Cardiovascular hypertension, Pt. on medications - angina+ CAD and + Past MI (questionable hx of MI. Cath neg c.2007) Rhythm:Regular Rate:Normal     Neuro/Psych Anxiety    GI/Hepatic   Endo/Other  Hyperthyroidism (multinodular goiter, no tracheal deviation noted.)   Renal/GU      Musculoskeletal   Abdominal   Peds  Hematology   Anesthesia Other Findings   Reproductive/Obstetrics                           Anesthesia Physical Anesthesia Plan  ASA: III  Anesthesia Plan: General   Post-op Pain Management:    Induction: Intravenous  Airway Management Planned: Oral ETT  Additional Equipment:   Intra-op Plan:   Post-operative Plan: Extubation in OR  Informed Consent: I have reviewed the patients History and Physical, chart, labs and discussed the procedure including the risks, benefits and alternatives for the proposed anesthesia with the patient or authorized representative who has indicated his/her understanding and acceptance.     Plan Discussed with:   Anesthesia Plan Comments:         Anesthesia Quick Evaluation

## 2011-10-08 NOTE — Anesthesia Procedure Notes (Signed)
Procedure Name: Intubation Date/Time: 10/08/2011 9:54 AM Performed by: Franco Nones Pre-anesthesia Checklist: Patient identified, Patient being monitored, Timeout performed, Emergency Drugs available and Suction available Patient Re-evaluated:Patient Re-evaluated prior to inductionOxygen Delivery Method: Circle System Utilized Preoxygenation: Pre-oxygenation with 100% oxygen Intubation Type: IV induction Laryngoscope Size: Miller and 2 Grade View: Grade II Tube type: Oral Tube size: 7.0 mm Number of attempts: 1 Airway Equipment and Method: stylet Placement Confirmation: ETT inserted through vocal cords under direct vision,  positive ETCO2 and breath sounds checked- equal and bilateral Secured at: 20 cm Tube secured with: Tape Dental Injury: Teeth and Oropharynx as per pre-operative assessment

## 2011-10-08 NOTE — Anesthesia Postprocedure Evaluation (Signed)
Anesthesia Post Note  Patient: [name] [name]  Procedure(s) Performed: Procedure(s) (LRB): THYROIDECTOMY (N/A)  Anesthesia type: General  Patient location: PACU  Post pain: Pain level controlled  Post assessment: Post-op Vital signs reviewed, Patient's Cardiovascular Status Stable, Respiratory Function Stable, Patent Airway, No signs of Nausea or vomiting and Pain level controlled  Last Vitals:  Filed Vitals:   10/08/11 1209  BP: 182/93  Pulse:   Temp: 37 C  Resp: 14    Post vital signs: Reviewed and stable  Level of consciousness: awake and alert   Complications: No apparent anesthesia complications

## 2011-10-08 NOTE — Progress Notes (Signed)
Eyeglasses placed in bag with belongings. Pt notified. Voiced understanding.

## 2011-10-09 LAB — COMPREHENSIVE METABOLIC PANEL
ALT: 16 U/L (ref 0–35)
Albumin: 3.3 g/dL — ABNORMAL LOW (ref 3.5–5.2)
Alkaline Phosphatase: 71 U/L (ref 39–117)
BUN: 10 mg/dL (ref 6–23)
Potassium: 3.6 mEq/L (ref 3.5–5.1)
Sodium: 137 mEq/L (ref 135–145)
Total Protein: 6.6 g/dL (ref 6.0–8.3)

## 2011-10-09 LAB — CBC
HCT: 41.4 % (ref 36.0–46.0)
MCV: 91 fL (ref 78.0–100.0)
RDW: 14.8 % (ref 11.5–15.5)
WBC: 9.4 10*3/uL (ref 4.0–10.5)

## 2011-10-09 NOTE — Anesthesia Postprocedure Evaluation (Signed)
  Anesthesia Post-op Note  Patient: [name] [name]  Procedure(s) Performed: Procedure(s) (LRB): THYROIDECTOMY (N/A)  Patient Location: 213  Anesthesia Type: General  Level of Consciousness: awake, alert , oriented and patient cooperative  Airway and Oxygen Therapy: Patient Spontanous Breathing  Post-op Pain: none  Post-op Assessment: Post-op Vital signs reviewed, Patient's Cardiovascular Status Stable, Respiratory Function Stable, Patent Airway, No signs of Nausea or vomiting and Pain level controlled  Post-op Vital Signs: Reviewed and stable  Complications: No apparent anesthesia complications

## 2011-10-09 NOTE — Addendum Note (Signed)
Addendum  created 10/09/11 1546 by Garnett Nunziata J Averill Winters, CRNA   Modules edited:Notes Section    

## 2011-10-09 NOTE — Progress Notes (Signed)
Pt discharged home with family.  IV removed - WNL. Instructed on new medications and incision care.  Instructed to make follow up appointment with MD on Thursday.  Patient verbalizes understanding - no questions at this time - stable for discharge

## 2011-10-11 NOTE — Discharge Summary (Signed)
Physician Discharge Summary  Patient ID: AYLINN RYDBERG MRN: [id number] DOB/AGE: Jun 21, 1945 66 y.o.  Admit date: 10/08/2011 Discharge date: 10/11/2011  Admission Diagnoses: Multinodular goiter  Discharge Diagnoses: Same Active Problems:  * No active hospital problems. *    Discharged Condition: good  Hospital Course: Patient is a 66 year old black female with multinodular goiter who presented to the operating room for a total thyroidectomy. She tolerated the procedure well. Her postoperative course was unremarkable. Her calcium levels remained within normal limits. She is discharged home in good improving condition. Final pathology is still pending.  Treatments: surgery: Total thyroidectomy on 10/08/2011  Discharge Exam: Blood pressure 133/79, pulse 74, temperature 97.9 F (36.6 C), temperature source Oral, resp. rate 16, height 5\' 3"  (1.6 m), weight 93 kg (205 lb 0.4 oz), SpO2 98.00%. General appearance: alert, cooperative and no distress Neck: supple, symmetrical, trachea midline and Incision healing well. Drain removed. Resp: clear to auscultation bilaterally Cardio: regular rate and rhythm, S1, S2 normal, no murmur, click, rub or gallop  Disposition: 01-Home or Self Care  Discharge Orders    Future Orders Please Complete By Expires   Diet - low sodium heart healthy      Increase activity slowly      Discharge instructions      Comments:   Increase activity as tolerated. May place ice pack for comfort.  Alternate an anti-inflammatory such as ibuprofen (Motrin, Advil) 400-600mg  every 6 hours with the prescribed pain medication.   Do not take any additional acetaminophen as there is Tylenol in the pain medication.   Driving Restrictions      Comments:   No driving while on pain medications.     Medication List  As of 10/11/2011  9:03 AM   TAKE these medications         ALPRAZolam 0.5 MG tablet   Commonly known as: XANAX   Take 0.5 mg by mouth daily as needed. For  anxiety      amLODipine 5 MG tablet   Commonly known as: NORVASC   Take 5 mg by mouth daily.      HYDROcodone-acetaminophen 5-325 MG per tablet   Commonly known as: NORCO/VICODIN   Take 1-2 tablets by mouth every 4 (four) hours as needed for pain.      levothyroxine 150 MCG tablet   Commonly known as: SYNTHROID, LEVOTHROID   Take 1 tablet (150 mcg total) by mouth daily.      Turmeric Curcumin 500 MG Caps   Take 2 capsules by mouth daily.           Follow-up Information    Follow up with Dalia Heading, MD. Schedule an appointment as soon as possible for a visit on 10/14/2011.   Contact information:   [address] [place] [address] [phone]          Signed: Dalia Heading 10/11/2011, 9:03 AM

## 2011-10-13 ENCOUNTER — Encounter (HOSPITAL_COMMUNITY): Payer: Self-pay | Admitting: General Surgery

## 2011-11-01 NOTE — Progress Notes (Signed)
UR Chart Review Completed-- OIB 

## 2011-12-17 ENCOUNTER — Encounter (HOSPITAL_COMMUNITY): Payer: Self-pay | Admitting: *Deleted

## 2011-12-17 ENCOUNTER — Emergency Department (HOSPITAL_COMMUNITY): Payer: Medicare Other

## 2011-12-17 ENCOUNTER — Emergency Department (HOSPITAL_COMMUNITY)
Admission: EM | Admit: 2011-12-17 | Discharge: 2011-12-17 | Disposition: A | Discharge status: Home / Self Care | Payer: Medicare Other | Attending: Emergency Medicine | Admitting: Emergency Medicine

## 2011-12-17 DIAGNOSIS — Y929 Unspecified place or not applicable: Secondary | ICD-10-CM | POA: Insufficient documentation

## 2011-12-17 DIAGNOSIS — M545 Low back pain, unspecified: Secondary | ICD-10-CM | POA: Insufficient documentation

## 2011-12-17 DIAGNOSIS — S2232XA Fracture of one rib, left side, initial encounter for closed fracture: Secondary | ICD-10-CM

## 2011-12-17 DIAGNOSIS — W19XXXA Unspecified fall, initial encounter: Secondary | ICD-10-CM

## 2011-12-17 DIAGNOSIS — M549 Dorsalgia, unspecified: Secondary | ICD-10-CM

## 2011-12-17 DIAGNOSIS — Y9389 Activity, other specified: Secondary | ICD-10-CM | POA: Insufficient documentation

## 2011-12-17 DIAGNOSIS — Z79899 Other long term (current) drug therapy: Secondary | ICD-10-CM | POA: Insufficient documentation

## 2011-12-17 DIAGNOSIS — M533 Sacrococcygeal disorders, not elsewhere classified: Secondary | ICD-10-CM

## 2011-12-17 DIAGNOSIS — S2239XA Fracture of one rib, unspecified side, initial encounter for closed fracture: Secondary | ICD-10-CM | POA: Insufficient documentation

## 2011-12-17 DIAGNOSIS — M461 Sacroiliitis, not elsewhere classified: Secondary | ICD-10-CM | POA: Insufficient documentation

## 2011-12-17 DIAGNOSIS — W1789XA Other fall from one level to another, initial encounter: Secondary | ICD-10-CM | POA: Insufficient documentation

## 2011-12-17 DIAGNOSIS — I252 Old myocardial infarction: Secondary | ICD-10-CM | POA: Insufficient documentation

## 2011-12-17 DIAGNOSIS — F411 Generalized anxiety disorder: Secondary | ICD-10-CM | POA: Insufficient documentation

## 2011-12-17 DIAGNOSIS — I10 Essential (primary) hypertension: Secondary | ICD-10-CM | POA: Insufficient documentation

## 2011-12-17 DIAGNOSIS — S6990XA Unspecified injury of unspecified wrist, hand and finger(s), initial encounter: Secondary | ICD-10-CM | POA: Insufficient documentation

## 2011-12-17 DIAGNOSIS — S298XXA Other specified injuries of thorax, initial encounter: Secondary | ICD-10-CM | POA: Insufficient documentation

## 2011-12-17 DIAGNOSIS — G4730 Sleep apnea, unspecified: Secondary | ICD-10-CM | POA: Insufficient documentation

## 2011-12-17 MED ORDER — HYDROCODONE-ACETAMINOPHEN 5-325 MG PO TABS: 1.0000 | ORAL_TABLET | ORAL | Status: DC | PRN

## 2011-12-17 MED ORDER — HYDROCODONE-ACETAMINOPHEN 5-325 MG PO TABS
2.0000 | ORAL_TABLET | Freq: Once | ORAL | Status: AC
  Administered 2011-12-17: 2 via ORAL
  Filled 2011-12-17: qty 2

## 2011-12-17 NOTE — ED Notes (Signed)
Pt c/o left flank, head and lower back pain.

## 2011-12-17 NOTE — ED Provider Notes (Signed)
History     CSN: [id number]  Arrival date & time 12/17/11  1754   First MD Initiated Contact with Patient 12/17/11 1815      Chief Complaint  Patient presents with  . Fall  . Head Injury  . Hand Pain    (Consider location/radiation/quality/duration/timing/severity/associated sxs/prior treatment) HPIMarsha Lacretia Nicks [name] is a 66 y.o. female who was leaning on a porch railing when it gave way and she fell to the ground with the railing.  It broke her glasses, she fell on her left side with her left arm underneath her.  Initially her left had was mildly swollen, but that resolved. She is complaining mostly about left sacro-iliac pain and rib pain located between the mid-axillary and mid-clavicular line.  Pain is moderate, worse on deep breath. No medication taken to ameliorate pain. Denies neurologic deficits. Did not hit head, denies LOC.  Past Medical History  Diagnosis Date  . Thyroid disease   . Hypertension   . Myocardial infarction   . Arthritis   . Anxiety   . Sleep apnea     STOP BANG 4    Past Surgical History  Procedure Date  . Abdominal hysterectomy   . Thyroidectomy 10/08/2011    Procedure: THYROIDECTOMY;  Surgeon: Dalia Heading, MD;  Location: [facility];  Service: General;  Laterality: N/A;  Total  . Cardiac catheterization     History reviewed. No pertinent family history.  History  Substance Use Topics  . Smoking status: Never Smoker   . Smokeless tobacco: Not on file  . Alcohol Use: No    OB History    Grav Para Term Preterm Abortions TAB SAB Ect Mult Living                  Review of Systems At least 10pt or greater review of systems completed and are negative except where specified in the HPI.  Allergies  Review of patient's allergies indicates no known allergies.  Home Medications   Current Outpatient Rx  Name  Route  Sig  Dispense  Refill  . ALPRAZOLAM 0.5 MG PO TABS   Oral   Take 0.5 mg by mouth daily as needed. For anxiety         .  AMLODIPINE BESYLATE 5 MG PO TABS   Oral   Take 5 mg by mouth every morning.          . ADULT MULTIVITAMIN W/MINERALS CH   Oral   Take 1 tablet by mouth daily.         . TURMERIC CURCUMIN 500 MG PO CAPS   Oral   Take 2 capsules by mouth daily.         [name] HYDROCODONE-ACETAMINOPHEN 5-325 MG PO TABS   Oral   Take 1-2 tablets by mouth every 4 (four) hours as needed for pain.   13 tablet   0     BP 148/79  Pulse 72  Temp 98 F (36.7 C) (Oral)  Resp 20  Ht 5\' 3"  (1.6 m)  Wt 212 lb (96.163 kg)  BMI 37.55 kg/m2  SpO2 99%  Physical Exam  PHYSICAL EXAM: VITAL SIGNS:  . Filed Vitals:   12/17/11 1801  BP: 148/79  Pulse: 72  Temp: 98 F (36.7 C)  TempSrc: Oral  Resp: 20  Height: 5\' 3"  (1.6 m)  Weight: 212 lb (96.163 kg)  SpO2: 99%   CONSTITUTIONAL: Awake, oriented, appears non-toxic HENT: Atraumatic, normocephalic, oral mucosa pink and moist, airway patent.  Nares patent without drainage. External ears normal. EYES: Conjunctiva clear, EOMI, PERRLA NECK: Trachea midline, non-tender, supple CARDIOVASCULAR: Normal heart rate, Normal rhythm, No murmurs, rubs, gallops PULMONARY/CHEST: Clear to auscultation, no rhonchi, wheezes, or rales. Symmetrical breath sounds. CHEST WALL: No lesions. Non-tender. ABDOMINAL: Non-distended, soft, non-tender - no rebound or guarding.  BS normal. NEUROLOGIC: NW:GNFAOZ fields intact.  Facial sensation equal to light touch bilaterally.  Good muscle bulk in the masseter muscle and good lateral movement of the jaw.  Facial expressions equal and good strength with smile/frown and puffed cheeks.  Hearing grossly intact to finger rub test.  Uvula, tongue are midline with no deviation. Symmetrical palate elevation.  Trapezius and SCM muscles are 5/5 strength bilaterally.   DTR: Brachioradialis, biceps, patellar, Achilles tendon reflexes 2+ bilaterally.  No clonus. Strength: 5/5 strength flexors and extensors in the upper and lower extremities.  Grip  strength, finger adduction/abduction 5/5. Sensation: Sensation intact distally to light touch Cerebellar: No ataxia with walking or dysmetria with finger to nose, rapid alternating hand movements and heels to shin testing. Gait and Station: Normal heel/toe, and tandem gait.  Negative Romberg, no pronator drift BACK: Mild left SI joint pain. EXTREMITIES: No clubbing, cyanosis, or edema.  Left hand, wrist and forearm has no deformity, no tenderness to palpation in the anatomic snuffbox or over carpals/metacarpals.  No signs of trauma. Radial and ulnar pulses 2+ SKIN: Warm, Dry, No erythema, No rash   ED Course  Procedures (including critical care time)  Labs Reviewed - No data to display Dg Ribs Unilateral W/chest Left  12/17/2011  *RADIOLOGY REPORT*  Clinical Data: Fall.  Head injury.  Hand pain. Left anterior lower rib pain and back pain.  LEFT RIBS AND CHEST - 3+ VIEW  Comparison: 07/13/2011  Findings: Heart is mildly enlarged.  The aorta is tortuous.  There are no focal consolidations or pleural effusions.  Surgical clips are identified in the lower neck.  No pneumothorax.  Oblique views of the left anterior lower ribs show contour abnormality of the left anterior sixth rib, raising question of acute fracture.  IMPRESSION:  1.  Cardiomegaly without pulmonary edema. 2.  Possible left anterior sixth rib fracture. 3.  No pneumothorax.   Original Report Authenticated By: Norva Pavlov, M.D.    Dg Lumbar Spine 2-3 Views  12/17/2011  *RADIOLOGY REPORT*  Clinical Data: Post fall, now with back and tailbone pain  LUMBAR SPINE - 2-3 VIEW  Comparison: Lumbar spine radiographs - 08/20/2011  Findings:  There are five non-rib bearing lumbar type vertebral bodies. Normal alignment of the lumbar spine.  Grossly unchanged mild (<25%) compression deformity of the anterior superior endplate of the L4 vertebral body.  The remaining vertebral body heights appear preserved.  Unchanged mild multilevel DDD, worse at  L3 - L4.  Limited visualization of the bilateral SI joints and hips is normal.  The visualized bowel gas pattern is normal.  Regional soft tissues are normal.  IMPRESSION: 1.  Unchanged mild (<25%) compression deformity of the anterior superior endplate of L4.  2.  Unchanged mild multilevel DDD, worse at L3 - L4.   Original Report Authenticated By: Tacey Ruiz, MD    Dg Sacrum/coccyx  12/17/2011  *RADIOLOGY REPORT*  Clinical Data: Post fall, now with tail bone pain  SACRUM AND COCCYX - 2+ VIEW  Comparison: None.  Findings:  No definite displaced fracture of the sacrum or coccyx.  Regional soft tissues are normal.  Limited visualization of the bilateral SI joints and  pubic symphysis appear normal.  IMPRESSION: No definite displaced fracture of the sacrum or coccyx.   Original Report Authenticated By: Tacey Ruiz, MD      1. Fracture of rib of left side   2. Fall   3. Back pain   4. Sacro-iliac pain       MDM  [name] [name] is a 66 y.o. female presenting after fall from about 3 ft w/ concern for rib fracture.  Doubt spinal injury.  Pt having SI joint pain, will assess with XR.  Hand exam is benign - imaging not required, discussed with pt who is in agreement.  XR of sacrum is negative.  Likely non-displaced 6th rib fracture correlating with patient's pain.  Prescribed pain medicines and advised her to keep active and to practice deep breathing.  I explained the diagnosis and have given explicit precautions to return to the ER including dyspnea, chest pain, fever chills or any other new or worsening symptoms. The patient understands and accepts the medical plan as it's been dictated and I have answered their questions. Discharge instructions concerning home care and prescriptions have been given.  The patient is STABLE and is discharged to home in good condition.         Jones Skene, MD 12/18/11 4782

## 2011-12-17 NOTE — ED Notes (Signed)
Patient transported to X-ray 

## 2011-12-17 NOTE — ED Notes (Signed)
Pt fell forward and broke her eyeglasses, hit her head, denies LOC, had left hand pain and had placed ice on it, swelling decreased, c/o HA and mid left rib pain, also bottom hurts

## 2013-01-27 ENCOUNTER — Emergency Department (HOSPITAL_COMMUNITY)
Admission: EM | Admit: 2013-01-27 | Discharge: 2013-01-27 | Disposition: A | Discharge status: Home / Self Care | Payer: Medicare Other | Attending: Emergency Medicine | Admitting: Emergency Medicine

## 2013-01-27 ENCOUNTER — Encounter (HOSPITAL_COMMUNITY): Payer: Self-pay | Admitting: Emergency Medicine

## 2013-01-27 DIAGNOSIS — Z8639 Personal history of other endocrine, nutritional and metabolic disease: Secondary | ICD-10-CM | POA: Insufficient documentation

## 2013-01-27 DIAGNOSIS — M129 Arthropathy, unspecified: Secondary | ICD-10-CM | POA: Insufficient documentation

## 2013-01-27 DIAGNOSIS — Z862 Personal history of diseases of the blood and blood-forming organs and certain disorders involving the immune mechanism: Secondary | ICD-10-CM | POA: Insufficient documentation

## 2013-01-27 DIAGNOSIS — F411 Generalized anxiety disorder: Secondary | ICD-10-CM | POA: Insufficient documentation

## 2013-01-27 DIAGNOSIS — J329 Chronic sinusitis, unspecified: Secondary | ICD-10-CM

## 2013-01-27 DIAGNOSIS — Z79899 Other long term (current) drug therapy: Secondary | ICD-10-CM | POA: Insufficient documentation

## 2013-01-27 DIAGNOSIS — I10 Essential (primary) hypertension: Secondary | ICD-10-CM | POA: Insufficient documentation

## 2013-01-27 MED ORDER — AMOXICILLIN-POT CLAVULANATE 875-125 MG PO TABS
1.0000 | ORAL_TABLET | Freq: Once | ORAL | Status: AC
  Administered 2013-01-27: 1 via ORAL
  Filled 2013-01-27: qty 1

## 2013-01-27 MED ORDER — AMOXICILLIN-POT CLAVULANATE 875-125 MG PO TABS: 1.0000 | ORAL_TABLET | Freq: Two times a day (BID) | ORAL | Status: DC

## 2013-01-27 NOTE — ED Provider Notes (Signed)
CSN: [id number]     Arrival date & time 01/27/13  1853 History   First MD Initiated Contact with Patient 01/27/13 1912     Chief Complaint  Patient presents with  . Nasal Congestion   (Consider location/radiation/quality/duration/timing/severity/associated sxs/prior Treatment) HPI.... left frontal and maxillary sinus congestion for several days with associated cough and headache. Patient has had recurrent sinusitis for some time. No fever, chills, stiff neck. Severity is mild to moderate. She's tried over-the-counter products with no relief.  Past Medical History  Diagnosis Date  . Thyroid disease   . Hypertension   . Arthritis   . Anxiety   . Sleep apnea     STOP BANG 4   Past Surgical History  Procedure Laterality Date  . Abdominal hysterectomy    . Thyroidectomy  10/08/2011    Procedure: THYROIDECTOMY;  Surgeon: Dalia Heading, MD;  Location: [facility];  Service: General;  Laterality: N/A;  Total  . Cardiac catheterization     History reviewed. No pertinent family history. History  Substance Use Topics  . Smoking status: Never Smoker   . Smokeless tobacco: Not on file  . Alcohol Use: No   OB History   Grav Para Term Preterm Abortions TAB SAB Ect Mult Living   2 2 2             Review of Systems  All other systems reviewed and are negative.    Allergies  Review of patient's allergies indicates no known allergies.  Home Medications   Current Outpatient Rx  Name  Route  Sig  Dispense  Refill  . ALPRAZolam (XANAX) 0.5 MG tablet   Oral   Take 0.5 mg by mouth daily as needed. For anxiety         . amLODipine (NORVASC) 5 MG tablet   Oral   Take 5 mg by mouth every morning.          [name] amoxicillin-clavulanate (AUGMENTIN) 875-125 MG per tablet   Oral   Take 1 tablet by mouth 2 (two) times daily. One po bid x 7 days   14 tablet   0   . HYDROcodone-acetaminophen (NORCO/VICODIN) 5-325 MG per tablet   Oral   Take 1-2 tablets by mouth every 4 (four) hours as  needed for pain.   13 tablet   0   . Multiple Vitamin (MULTIVITAMIN WITH MINERALS) TABS   Oral   Take 1 tablet by mouth daily.         . Turmeric Curcumin 500 MG CAPS   Oral   Take 2 capsules by mouth daily.          BP 145/86  Pulse 79  Temp(Src) 97.7 F (36.5 C) (Oral)  Resp 20  Ht 5\' 3"  (1.6 m)  Wt 200 lb (90.719 kg)  BMI 35.44 kg/m2  SpO2 97% Physical Exam  Nursing note and vitals reviewed. Constitutional: She is oriented to person, place, and time. She appears well-developed and well-nourished.  HENT:  Head: Normocephalic and atraumatic.  Tender over left frontal and maxillary sinus  Eyes: Conjunctivae and EOM are normal. Pupils are equal, round, and reactive to light.  Neck: Normal range of motion. Neck supple.  Cardiovascular: Normal rate, regular rhythm and normal heart sounds.   Pulmonary/Chest: Effort normal and breath sounds normal.  Abdominal: Soft. Bowel sounds are normal.  Musculoskeletal: Normal range of motion.  Neurological: She is alert and oriented to person, place, and time.  Skin: Skin is warm and dry.  Psychiatric: She has a normal mood and affect. Her behavior is normal.    ED Course  Procedures (including critical care time) Labs Review Labs Reviewed - No data to display Imaging Review No results found.  EKG Interpretation   None       MDM   1. Sinusitis    Patient is nontoxic. Rx Augmentin 875/125 twice a day for 10 days.    Donnetta Hutching, MD 01/27/13 2026

## 2013-01-27 NOTE — ED Notes (Signed)
Pt reports cough with thick clear sputum.

## 2013-01-27 NOTE — ED Notes (Signed)
Pt c/o sinus congestion, headache and difficulty breathing from her nose.

## 2013-02-08 DIAGNOSIS — I214 Non-ST elevation (NSTEMI) myocardial infarction: Secondary | ICD-10-CM

## 2013-02-08 HISTORY — DX: Non-ST elevation (NSTEMI) myocardial infarction: I21.4

## 2013-09-20 ENCOUNTER — Encounter (HOSPITAL_COMMUNITY)
Admission: AD | Disposition: A | Discharge status: Home / Self Care | Payer: Self-pay | Source: Other Acute Inpatient Hospital | Attending: Interventional Cardiology

## 2013-09-20 ENCOUNTER — Encounter (HOSPITAL_COMMUNITY): Payer: Self-pay | Admitting: Physician Assistant

## 2013-09-20 ENCOUNTER — Inpatient Hospital Stay (HOSPITAL_COMMUNITY)
Admission: AD | Admit: 2013-09-20 | Discharge: 2013-09-22 | DRG: 282 | Disposition: A | Discharge status: Home / Self Care | Payer: Medicare HMO | Source: Other Acute Inpatient Hospital | Attending: Interventional Cardiology | Admitting: Interventional Cardiology

## 2013-09-20 DIAGNOSIS — G4730 Sleep apnea, unspecified: Secondary | ICD-10-CM | POA: Diagnosis present

## 2013-09-20 DIAGNOSIS — R918 Other nonspecific abnormal finding of lung field: Secondary | ICD-10-CM | POA: Diagnosis present

## 2013-09-20 DIAGNOSIS — I2510 Atherosclerotic heart disease of native coronary artery without angina pectoris: Secondary | ICD-10-CM | POA: Diagnosis present

## 2013-09-20 DIAGNOSIS — Z79899 Other long term (current) drug therapy: Secondary | ICD-10-CM | POA: Diagnosis not present

## 2013-09-20 DIAGNOSIS — E039 Hypothyroidism, unspecified: Secondary | ICD-10-CM | POA: Diagnosis present

## 2013-09-20 DIAGNOSIS — G4733 Obstructive sleep apnea (adult) (pediatric): Secondary | ICD-10-CM | POA: Diagnosis present

## 2013-09-20 DIAGNOSIS — I2582 Chronic total occlusion of coronary artery: Secondary | ICD-10-CM | POA: Diagnosis present

## 2013-09-20 DIAGNOSIS — F411 Generalized anxiety disorder: Secondary | ICD-10-CM | POA: Diagnosis present

## 2013-09-20 DIAGNOSIS — I214 Non-ST elevation (NSTEMI) myocardial infarction: Principal | ICD-10-CM | POA: Diagnosis present

## 2013-09-20 DIAGNOSIS — I119 Hypertensive heart disease without heart failure: Secondary | ICD-10-CM | POA: Diagnosis present

## 2013-09-20 DIAGNOSIS — Z7982 Long term (current) use of aspirin: Secondary | ICD-10-CM

## 2013-09-20 DIAGNOSIS — E669 Obesity, unspecified: Secondary | ICD-10-CM | POA: Diagnosis present

## 2013-09-20 DIAGNOSIS — I10 Essential (primary) hypertension: Secondary | ICD-10-CM

## 2013-09-20 DIAGNOSIS — E785 Hyperlipidemia, unspecified: Secondary | ICD-10-CM | POA: Diagnosis present

## 2013-09-20 DIAGNOSIS — Z7902 Long term (current) use of antithrombotics/antiplatelets: Secondary | ICD-10-CM

## 2013-09-20 DIAGNOSIS — E782 Mixed hyperlipidemia: Secondary | ICD-10-CM | POA: Diagnosis present

## 2013-09-20 DIAGNOSIS — R7309 Other abnormal glucose: Secondary | ICD-10-CM | POA: Diagnosis present

## 2013-09-20 DIAGNOSIS — F419 Anxiety disorder, unspecified: Secondary | ICD-10-CM | POA: Diagnosis present

## 2013-09-20 DIAGNOSIS — Z6837 Body mass index (BMI) 37.0-37.9, adult: Secondary | ICD-10-CM

## 2013-09-20 HISTORY — DX: Personal history of other medical treatment: Z92.89

## 2013-09-20 HISTORY — DX: Non-ST elevation (NSTEMI) myocardial infarction: I21.4

## 2013-09-20 HISTORY — DX: Hypothyroidism, unspecified: E03.9

## 2013-09-20 HISTORY — PX: LEFT HEART CATHETERIZATION WITH CORONARY ANGIOGRAM: SHX5451

## 2013-09-20 HISTORY — PX: CORONARY ANGIOPLASTY: SHX604

## 2013-09-20 LAB — TROPONIN I: Troponin I: 8.14 ng/mL (ref <0.30)

## 2013-09-20 SURGERY — LEFT HEART CATHETERIZATION WITH CORONARY ANGIOGRAM
Anesthesia: LOCAL

## 2013-09-20 MED ORDER — ACETAMINOPHEN 325 MG PO TABS
650.0000 mg | ORAL_TABLET | ORAL | Status: DC | PRN
  Administered 2013-09-21 (×2): 650 mg via ORAL
  Filled 2013-09-20 (×2): qty 2

## 2013-09-20 MED ORDER — CLOPIDOGREL BISULFATE 75 MG PO TABS
75.0000 mg | ORAL_TABLET | Freq: Every day | ORAL | Status: DC
  Administered 2013-09-21 – 2013-09-22 (×2): 75 mg via ORAL
  Filled 2013-09-20 (×2): qty 1

## 2013-09-20 MED ORDER — NITROGLYCERIN 0.2 MG/ML ON CALL CATH LAB
INTRAVENOUS | Status: AC
  Filled 2013-09-20: qty 1

## 2013-09-20 MED ORDER — ALPRAZOLAM 0.5 MG PO TABS: 0.5000 mg | ORAL_TABLET | Freq: Every day | ORAL | Status: DC | PRN

## 2013-09-20 MED ORDER — ENOXAPARIN SODIUM 100 MG/ML ~~LOC~~ SOLN
95.0000 mg | Freq: Two times a day (BID) | SUBCUTANEOUS | Status: DC
  Administered 2013-09-20 – 2013-09-22 (×3): 95 mg via SUBCUTANEOUS
  Filled 2013-09-20 (×5): qty 1

## 2013-09-20 MED ORDER — HEPARIN (PORCINE) IN NACL 2-0.9 UNIT/ML-% IJ SOLN
INTRAMUSCULAR | Status: AC
  Filled 2013-09-20: qty 1500

## 2013-09-20 MED ORDER — SODIUM CHLORIDE 0.9 % IV SOLN
INTRAVENOUS | Status: AC
  Administered 2013-09-20: 17:00:00 via INTRAVENOUS

## 2013-09-20 MED ORDER — ACETAMINOPHEN 325 MG PO TABS: 650.0000 mg | ORAL_TABLET | ORAL | Status: DC | PRN

## 2013-09-20 MED ORDER — ASPIRIN 81 MG PO CHEW
81.0000 mg | CHEWABLE_TABLET | Freq: Every day | ORAL | Status: DC
  Administered 2013-09-21 – 2013-09-22 (×2): 81 mg via ORAL
  Filled 2013-09-20 (×3): qty 1

## 2013-09-20 MED ORDER — FENTANYL CITRATE 0.05 MG/ML IJ SOLN
INTRAMUSCULAR | Status: AC
  Filled 2013-09-20: qty 2

## 2013-09-20 MED ORDER — ADULT MULTIVITAMIN W/MINERALS CH
1.0000 | ORAL_TABLET | Freq: Every day | ORAL | Status: DC
  Administered 2013-09-20 – 2013-09-21 (×2): 1 via ORAL
  Filled 2013-09-20 (×3): qty 1

## 2013-09-20 MED ORDER — LIDOCAINE HCL (PF) 1 % IJ SOLN
INTRAMUSCULAR | Status: AC
  Filled 2013-09-20: qty 30

## 2013-09-20 MED ORDER — TURMERIC CURCUMIN 500 MG PO CAPS
2.0000 | ORAL_CAPSULE | Freq: Every day | ORAL | Status: DC
Start: 2013-09-20 — End: 2013-09-20

## 2013-09-20 MED ORDER — ONDANSETRON HCL 4 MG/2ML IJ SOLN: 4.0000 mg | Freq: Four times a day (QID) | INTRAMUSCULAR | Status: DC | PRN

## 2013-09-20 MED ORDER — OXYCODONE-ACETAMINOPHEN 5-325 MG PO TABS
1.0000 | ORAL_TABLET | ORAL | Status: DC | PRN
  Administered 2013-09-20 – 2013-09-22 (×2): 2 via ORAL
  Filled 2013-09-20 (×2): qty 2

## 2013-09-20 MED ORDER — NITROGLYCERIN 0.4 MG SL SUBL
0.4000 mg | SUBLINGUAL_TABLET | SUBLINGUAL | Status: DC | PRN
  Filled 2013-09-20: qty 1

## 2013-09-20 MED ORDER — MIDAZOLAM HCL 2 MG/2ML IJ SOLN
INTRAMUSCULAR | Status: AC
  Filled 2013-09-20: qty 2

## 2013-09-20 MED ORDER — VERAPAMIL HCL 2.5 MG/ML IV SOLN
INTRAVENOUS | Status: AC
  Filled 2013-09-20: qty 2

## 2013-09-20 MED ORDER — ASPIRIN EC 81 MG PO TBEC: 81.0000 mg | DELAYED_RELEASE_TABLET | Freq: Every day | ORAL | Status: DC

## 2013-09-20 MED ORDER — ISOSORBIDE MONONITRATE ER 60 MG PO TB24
60.0000 mg | ORAL_TABLET | Freq: Every day | ORAL | Status: DC
  Administered 2013-09-20 – 2013-09-22 (×3): 60 mg via ORAL
  Filled 2013-09-20 (×3): qty 1

## 2013-09-20 MED ORDER — HEPARIN SODIUM (PORCINE) 1000 UNIT/ML IJ SOLN
INTRAMUSCULAR | Status: AC
  Filled 2013-09-20: qty 1

## 2013-09-20 MED ORDER — MIDAZOLAM HCL 2 MG/2ML IJ SOLN
INTRAMUSCULAR | Status: AC
Start: 2013-09-20 — End: 2013-09-20
  Filled 2013-09-20: qty 2

## 2013-09-20 MED ORDER — METOPROLOL SUCCINATE ER 25 MG PO TB24
25.0000 mg | ORAL_TABLET | Freq: Every day | ORAL | Status: DC
  Administered 2013-09-20: 25 mg via ORAL
  Filled 2013-09-20 (×2): qty 1

## 2013-09-20 NOTE — CV Procedure (Signed)
     Left Heart Catheterization with Coronary Angiography  Report  [name] [name]  68 y.o.  female 02-Oct-1945  Procedure Date: 09/20/2013 Referring Physician: Rosita Fire, MD Primary Cardiologist: [facility] Blenda Bridegroom, M.D.  INDICATIONS: Non-ST elevation myocardial infarction  PROCEDURE: 1. Left heart catheterization; 2. Coronary angiography; 3. Left ventriculography  CONSENT:  The risks, benefits, and details of the procedure were explained in detail to the patient. Risks including death, stroke, heart attack, kidney injury, allergy, limb ischemia, bleeding and radiation injury were discussed.  The patient verbalized understanding and wanted to proceed.  Informed written consent was obtained.  PROCEDURE TECHNIQUE:  After Xylocaine anesthesia a 5 French Slender sheath was placed in the right radial artery with an angiocath and the modified Seldinger technique.  Coronary angiography was done using a 5 F JR 4, JL 3.5 cm diagnostic and 5 [place] EBU 3.5 cm guide catheter.  Left ventriculography was done using the JR 4 catheter and hand injection.   Careful review of the images did not reveal an and available lesion related to the patient's non-ST elevation MI. The distal circumflex appears to be the culprit territory where high-grade obstruction is noted prior to several very tiny branches.   CONTRAST:  Total of 160 cc.  COMPLICATIONS:  None   HEMODYNAMICS:  Aortic pressure 145/86 mmHg; LV pressure 147/12 mmHg; LVEDP 18 mm mercury  ANGIOGRAPHIC DATA:   The left main coronary artery is is widely patent/normal.  The left anterior descending artery is widely patent. A large trifurcating first diagonal arises proximally. The LAD then trifurcates into a large septal perforator, and anterior interventricular groove branch that wraps around the left ventricular apex, and a second diagonal branch is small in distribution. Beyond the proximal segment the LAD, and diagonal branches are highly  tortuous but no significant obstruction is seen in the main vessels or branches..  The left circumflex artery is nondominant and gives origin to a large first obtuse marginal which contains moderate to severe mid vessel disease within a region of tortuosity. The mid to distal portion of this marginal contains 60 -80% obstruction that is best treated with medical therapy. The continuation of the circumflex is severely diseased and then totally occluded distally With very small branches noted to fill late by collaterals and antegrade flow, TIMI grade 2. The distribution of the distal circumflex is small and not treatable with PCI.  The right coronary artery is very tortuous, dominant vessel. No obstruction is noted.  LEFT VENTRICULOGRAM:  Left ventricular angiogram was done in the 30 RAO projection and revealed normal cavity size and contractility with an estimated EF of 55%   IMPRESSIONS:  1. Non-ST elevation myocardial infarction related to occlusion of the distal circumflex. The mid to distal obtuse marginal #1 also contains a significant stenosis that is best treated with medical therapy .  2. Widely patent RCA, and LAD. Marked tortuosity is noted in both vascular territories. The left main is widely patent.  3. Overall normal left ventricular function with an estimated ejection fraction of 55%   RECOMMENDATION:  Medical therapy to include beta blocker, long-acting nitrate, aspirin and Plavix (30-90 days), and aggressive blood pressure and lipid control.

## 2013-09-20 NOTE — Progress Notes (Signed)
UR completed Javin Nong K. Jearl Soto, RN, BSN, MSHL, CCM  09/20/2013 4:53 PM

## 2013-09-20 NOTE — Care Management Note (Addendum)
  Page 1 of 1   09/20/2013     4:56:14 PM CARE MANAGEMENT NOTE 09/20/2013  Patient:  [name] [name], [name] [name]   Account Number:  [id number]  Date Initiated:  09/20/2013  Documentation initiated by:  Mariann Laster  Subjective/Objective Assessment:   NSTEMI     Action/Plan:   CM to follow for disposition needs   Anticipated DC Date:  09/21/2013   Anticipated DC Plan:  HOME/SELF CARE         Choice offered to / List presented to:             Status of service:  Completed, signed off Medicare Important Message given?   (If response is "NO", the following Medicare IM given date fields will be blank) Date Medicare IM given:   Medicare IM given by:   Date Additional Medicare IM given:   Additional Medicare IM given by:    Discharge Disposition:    Per UR Regulation:  Reviewed for med. necessity/level of care/duration of stay  If discussed at [place] of Stay Meetings, dates discussed:    Comments:  Damien Batty RN, BSN, MSHL, CCM  Nurse - Case Manager,  ([facility] [place])  [phone]  09/20/2013

## 2013-09-20 NOTE — Progress Notes (Signed)
ANTICOAGULATION CONSULT NOTE - Initial Consult  Pharmacy Consult for Lovenox Indication: ACS, NSTEMI  No Known Allergies  Patient Measurements: Weight: 205 lb 0.4 oz (93 kg) Heparin Dosing Weight:   Vital Signs: Temp: 98.1 F (36.7 C) (08/13 1943) Temp src: Oral (08/13 1943) BP: 156/87 mmHg (08/13 1943) Pulse Rate: 61 (08/13 1943)  Labs:  Recent Labs  09/20/13 1820  TROPONINI 8.14*    The CrCl is unknown because both a height and weight (above a minimum accepted value) are required for this calculation.   Medical History: Past Medical History  Diagnosis Date  . Hypertension   . Anxiety   . Sleep apnea     STOP BANG 4  . Hypothyroidism   . Thyroid disease   . NSTEMI (non-ST elevated myocardial infarction) 09/20/2013    Medications:  Scheduled:  . [START ON 09/21/2013] aspirin  81 mg Oral Daily  . [START ON 09/21/2013] clopidogrel  75 mg Oral Q breakfast  . [START ON 09/21/2013] enoxaparin (LOVENOX) injection  95 mg Subcutaneous Q12H  . isosorbide mononitrate  60 mg Oral Daily  . metoprolol succinate  25 mg Oral Daily  . multivitamin with minerals  1 tablet Oral Daily    Assessment: 68 yr old female to be started on Lovenox following cardiac cath for ACS/NSTEMI. Treatment plan is for beta blocker, long acting nitrate, aspirin, Plavix for 30-90 days and aggressive blood pressure and lipid control. Goal of Therapy:  Anti-Xa level 0.6-1 units/ml 4hrs after LMWH dose given Monitor platelets by anticoagulation protocol: Yes   Plan:  Lovenox 95 mg sq q12h. F/U platelet count every 3 days. Check CrCl in the am after AM labs are resulted.   Minta Balsam 09/20/2013,9:23 PM

## 2013-09-20 NOTE — H&P (Addendum)
[name] [name] is a 68 y.o. female  Admit Date: 09/20/2013 Referring Physician: Rosita Fire, M.D. Primary Cardiologist:   None Chief complaint / reason for admission: Non-ST elevation MI  HPI: 68 year old African American female transferred to [place] from [facility]. She has been to the emergency room on 3 separate occasions including this admission. On the 2 previous occasions cardiac markers were negative and she was discharged from the hospital. On this occasion, she came in complaining of several hours of chest pressure and her second cardiac marker was significantly elevated with a troponin greater than 3. When the discomfort is present she has dyspnea and sweating. Over the time frame of her complaint, there seems to be precipitation by exertional activities. Greater than 10 years ago she had a coronary angiogram performed that revealed nonobstructive coronary disease. There is no prior history of MI, stents, or other cardiac diagnoses other than hypertension. She is nondiabetic. She does not smoke cigarettes.    PMH:    Past Medical History  Diagnosis Date  . Thyroid disease   . Hypertension   . Arthritis   . Anxiety   . Sleep apnea     STOP BANG 4    PSH:    Past Surgical History  Procedure Laterality Date  . Abdominal hysterectomy    . Thyroidectomy  10/08/2011    Procedure: THYROIDECTOMY;  Surgeon: [name], MD;  Location: [facility];  Service: General;  Laterality: N/A;  Total  . Cardiac catheterization        Medication List    ASK your doctor about these medications       ALPRAZolam 0.5 MG tablet  Commonly known as:  XANAX  Take 0.5 mg by mouth daily as needed. For anxiety     amLODipine 5 MG tablet  Commonly known as:  NORVASC  Take 5 mg by mouth every morning.     amoxicillin-clavulanate 875-125 MG per tablet  Commonly known as:  AUGMENTIN  Take 1 tablet by mouth 2 (two) times daily. One po bid x 7 days     multivitamin  with minerals Tabs tablet  Take 1 tablet by mouth daily.     Turmeric Curcumin 500 MG Caps  Take 2 capsules by mouth daily.        ALLERGIES:   Review of patient's allergies indicates no known allergies.  Prior to Admit Meds:   Prescriptions prior to admission  Medication Sig Dispense Refill  . ALPRAZolam (XANAX) 0.5 MG tablet Take 0.5 mg by mouth daily as needed. For anxiety      . amLODipine (NORVASC) 5 MG tablet Take 5 mg by mouth every morning.       [name] amoxicillin-clavulanate (AUGMENTIN) 875-125 MG per tablet Take 1 tablet by mouth 2 (two) times daily. One po bid x 7 days  14 tablet  0  . Multiple Vitamin (MULTIVITAMIN WITH MINERALS) TABS Take 1 tablet by mouth daily.      . Turmeric Curcumin 500 MG CAPS Take 2 capsules by mouth daily.       Family HX:   No family history on file. Social HX:    History   Social History  . Marital Status: Widowed    Spouse Name: N/A    Number of Children: N/A  . Years of Education: N/A   Occupational History  . Not on file.   Social History Main Topics  . Smoking status: Never Smoker   . Smokeless  tobacco: Not on file  . Alcohol Use: No  . Drug Use: No  . Sexual Activity: Not on file   Other Topics Concern  . Not on file   Social History Narrative  . No narrative on file     ROS: Denies bleeding, stroke, abdominal pain, kidney disease, asthma, weight loss, and claudication. She does have thyroid disease and hypertension.  Physical Exam:   Blood pressure 160/94 mmHg, heart rate 66 beats per minute, respiratory rate 16 and nonlabored HEENT exam is unremarkable Skin is clear without jaundice or cyanosis. No pallor is noted. Chest is clear to auscultation and percussion. She is able to lie flat without dyspnea. Cardiac exam reveals an S4 gallop. No rub or murmur is heard. Abdomen is soft. No pulsatile masses are noted. Bowel sounds are noted. Extremities reveal no edema. The pulses are 2+ and symmetric in upper and lower  extremities. Neurological exam reveals no motor sensory deficits. The patient is alert, and has no cranial nerve deficits.  Labs: Lab Results  Component Value Date   WBC 9.4 10/09/2011   HGB 13.8 10/09/2011   HCT 41.4 10/09/2011   MCV 91.0 10/09/2011   PLT 349 10/09/2011   No results found for this basename: NA, K, CL, CO2, BUN, CREATININE, CALCIUM, LABALBU, PROT, BILITOT, ALKPHOS, ALT, AST, GLUCOSE,  in the last 168 hours No results found for this basename: CKTOTAL, CKMB, CKMBINDEX, TROPONINI     Radiology:  A chest CT did not reveal evidence of coronary calcification, pneumonia, or edema.  EKG:  ECG reveals poor R-wave progression but no acute ischemic changes  Creatinine is 0.70 And the platelet count 454,000. Hemoglobin is 15.1 Troponin I is 3.2  ASSESSMENT: 1. Non-ST elevation myocardial infarction 2. Hypertension 3. Obesity 4. Hyperlipidemia 5. Hypothyroidism  Plan:  1. The patient is transferred to undergo further cardiac evaluation in the setting of non-ST elevation MI. She has been examined and counseled to undergo coronary angiography with possible percutaneous coronary intervention. The nature of the procedure including the risks of stroke, death, myocardial infarction, bleeding, limb ischemia, allergy, among others were discussed in detail with the patient and except.  2. Long-acting nitrates have been applied at the outlying hospital. Aspirin and heparin have been administered. Sinclair Grooms 09/20/2013 3:12 PM

## 2013-09-21 ENCOUNTER — Encounter (HOSPITAL_COMMUNITY): Payer: Self-pay | Admitting: *Deleted

## 2013-09-21 DIAGNOSIS — I319 Disease of pericardium, unspecified: Secondary | ICD-10-CM

## 2013-09-21 LAB — CBC
HCT: 40.9 % (ref 36.0–46.0)
Hemoglobin: 13.2 g/dL (ref 12.0–15.0)
MCH: 30.9 pg (ref 26.0–34.0)
MCHC: 32.3 g/dL (ref 30.0–36.0)
MCV: 95.8 fL (ref 78.0–100.0)
PLATELETS: 392 10*3/uL (ref 150–400)
RBC: 4.27 MIL/uL (ref 3.87–5.11)
RDW: 16.1 % — ABNORMAL HIGH (ref 11.5–15.5)
WBC: 6.6 10*3/uL (ref 4.0–10.5)

## 2013-09-21 LAB — BASIC METABOLIC PANEL
Anion gap: 12 (ref 5–15)
BUN: 14 mg/dL (ref 6–23)
CALCIUM: 8.6 mg/dL (ref 8.4–10.5)
CO2: 23 mEq/L (ref 19–32)
Chloride: 105 mEq/L (ref 96–112)
Creatinine, Ser: 1.08 mg/dL (ref 0.50–1.10)
GFR calc Af Amer: 60 mL/min — ABNORMAL LOW (ref >90)
GFR, EST NON AFRICAN AMERICAN: 52 mL/min — AB (ref >90)
GLUCOSE: 125 mg/dL — AB (ref 70–99)
Potassium: 4 mEq/L (ref 3.7–5.3)
Sodium: 140 mEq/L (ref 137–147)

## 2013-09-21 LAB — LIPID PANEL
CHOL/HDL RATIO: 2.8 ratio
Cholesterol: 155 mg/dL (ref 0–200)
HDL: 55 mg/dL (ref >39)
LDL CALC: 87 mg/dL (ref 0–99)
Triglycerides: 63 mg/dL (ref <150)
VLDL: 13 mg/dL (ref 0–40)

## 2013-09-21 LAB — HEPATIC FUNCTION PANEL
ALT: 24 U/L (ref 0–35)
AST: 60 U/L — AB (ref 0–37)
Albumin: 3.1 g/dL — ABNORMAL LOW (ref 3.5–5.2)
Alkaline Phosphatase: 59 U/L (ref 39–117)
Total Bilirubin: 0.4 mg/dL (ref 0.3–1.2)
Total Protein: 6.3 g/dL (ref 6.0–8.3)

## 2013-09-21 LAB — TROPONIN I
Troponin I: 10.35 ng/mL (ref <0.30)
Troponin I: 8.22 ng/mL (ref <0.30)

## 2013-09-21 MED ORDER — ATORVASTATIN CALCIUM 80 MG PO TABS
80.0000 mg | ORAL_TABLET | Freq: Every day | ORAL | Status: DC
  Administered 2013-09-21: 18:00:00 80 mg via ORAL
  Filled 2013-09-21 (×2): qty 1

## 2013-09-21 MED ORDER — WHITE PETROLATUM GEL
Status: AC
  Filled 2013-09-21: qty 5

## 2013-09-21 MED ORDER — LEVOTHYROXINE SODIUM 100 MCG PO TABS
100.0000 ug | ORAL_TABLET | Freq: Every day | ORAL | Status: DC
  Administered 2013-09-21 – 2013-09-22 (×2): 100 ug via ORAL
  Filled 2013-09-21 (×4): qty 1

## 2013-09-21 MED ORDER — METOPROLOL SUCCINATE ER 50 MG PO TB24
50.0000 mg | ORAL_TABLET | Freq: Every day | ORAL | Status: DC
  Administered 2013-09-21 – 2013-09-22 (×2): 50 mg via ORAL
  Filled 2013-09-21 (×3): qty 1

## 2013-09-21 MED FILL — Ondansetron HCl Inj 4 MG/2ML (2 MG/ML): INTRAMUSCULAR | Qty: 2 | Status: AC

## 2013-09-21 MED FILL — Heparin Sodium (Porcine) 100 Unt/ML in Sodium Chloride 0.45%: INTRAMUSCULAR | Qty: 250 | Status: AC

## 2013-09-21 NOTE — Progress Notes (Signed)
  Patient: [name] [name] / Admit Date: 09/20/2013 / Date of Encounter: 09/21/2013, 7:11 AM   Subjective: Feeling much better. No CP or SOB. No orthopnea/PND.   Objective: Telemetry: NSR rare PVCs Physical Exam: Blood pressure 94/62, pulse 58, temperature 97.8 F (36.6 C), temperature source Oral, resp. rate 18, height 5' 2.99" (1.6 m), weight 209 lb 10.5 oz (95.1 kg), SpO2 97.00%. General: Well developed, well nourished AAF, in no acute distress. Laying 15 degrees in bed without any symptoms. Head: Normocephalic, atraumatic, sclera non-icteric, no xanthomas, nares are without discharge. Neck: Negative for carotid bruits.  Lungs: Clear bilaterally to auscultation without wheezes, rales, or rhonchi. Breathing is unlabored. Heart: RRR S1 S2 without murmurs, rubs, or gallops.  Abdomen: Soft, non-tender, non-distended with normoactive bowel sounds. No rebound/guarding. Extremities: No clubbing or cyanosis. No edema. Distal pedal pulses are 2+ and equal bilaterally. R radial cath site without ecchymosis or hematoma, good pulse Neuro: Alert and oriented X 3. Moves all extremities spontaneously. Psych:  Responds to questions appropriately with a normal affect.   Intake/Output Summary (Last 24 hours) at 09/21/13 0711 Last data filed at 09/21/13 9244  Gross per 24 hour  Intake    360 ml  Output    700 ml  Net   -340 ml    Inpatient Medications:  . aspirin  81 mg Oral Daily  . clopidogrel  75 mg Oral Q breakfast  . enoxaparin (LOVENOX) injection  95 mg Subcutaneous Q12H  . isosorbide mononitrate  60 mg Oral Daily  . levothyroxine  100 mcg Oral QAC breakfast  . metoprolol succinate  25 mg Oral Daily  . multivitamin with minerals  1 tablet Oral Daily   Infusions:    Labs:  Recent Labs  09/21/13 0246  NA 140  K 4.0  CL 105  CO2 23  GLUCOSE 125*  BUN 14  CREATININE 1.08  CALCIUM 8.6     Recent Labs  09/21/13 0246  WBC 6.6  HGB 13.2  HCT 40.9  MCV 95.8  PLT 392     Recent Labs  09/20/13 1820 09/21/13 0014 09/21/13 0543  TROPONINI 8.14* 10.35* 8.22*   Recent lipid profile per [facility] Note showed LDL 111  Radiology/Studies:  CXR: diffuse pulm vascular congestion/mild interstitial prominence suggestive of early pulm edema CTA chest: No PE. Mild interstitial prominence. Bibasilar airspace opacities likely, ? Atx. Retroesophageal right subclavian artery incidentally noted. Previously noted pulm nodules are relatively stable in appearance.   Assessment and Plan  1. 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 The mid to distal obtuse marginal #1 also contains a significant stenosis that is best treated with medical therapy. Patent RCA/LAD with tortuosity. LM patent. EF 55%. 2. HTN 3. Obesity Body mass index is 37.15 kg/(m^2). 4. Hyperlipidemia 5. Hypothyroidism 6. Pulmonary nodules by CT, stable, has f/u with PCP regarding this 7. Hyperglycemia - should f/u PCP for this, no hx of DM  Doing well post-cath. Troponin peaked at 10 overnight and slowly downtrending. 2D echo pending. Have asked nursing to call lab to add on LFTs. Add statin (recent LDL was 111 per [facility] note). Recheck labs 6-8 weeks. F/u PCP for pulm nodules and blood sugar. Will discuss timing of discharge with MD since troponin just peaked last night.   Cardiac rehab to see this AM.  Signed, Melina Copa PA-C

## 2013-09-21 NOTE — Progress Notes (Signed)
  Echocardiogram 2D Echocardiogram has been performed.  Syon Tews FRANCES 09/21/2013, 9:45 AM

## 2013-09-21 NOTE — Progress Notes (Signed)
CARDIAC REHAB PHASE I   PRE:  Rate/Rhythm: 66 SR  BP:  Supine:   Sitting: 134/73  Standing:    SaO2: 95 RA  MODE:  Ambulation: 1000 ft   POST:  Rate/Rhythm: 87 SR  BP:  Supine:   Sitting: 137.75  Standing:    SaO2: 95 RA 1145-1250 Pt tolerated ambulation well. Gait steady. VS stable On return to room questioned pt if she had any cp. Pt states that she has a little discomfort over right breast area. On pain scale pt states that it was a "1". Discomfort was relieved with rest completely. Completed Mi education with pt. She voices understanding. Pt agrees to [place]. CRP in [place], will send referral.  Rodney Langton RN 09/21/2013 12:48 PM

## 2013-09-21 NOTE — Progress Notes (Addendum)
The patient has hypertensive heart disease and BP control will be crucial. She is 24+ hours into completed NSTEMI related to Cfx occlusion and will need 24 more hours of management and optimization of therapy.  May need to resume amlodipine if BP remains an issue.  May need to consider sleep apnea.

## 2013-09-21 NOTE — Progress Notes (Addendum)
Pt's troponin level for 1820 has resulted at 8.14.  Oncall physician notified and updated on pt's hospital course thus far with original troponin at 3.2 at [facility] hospital and now 8.14. Pt vss.  No chest pain or rhythm changes.  No new orders received.

## 2013-09-22 ENCOUNTER — Encounter (HOSPITAL_COMMUNITY): Payer: Self-pay | Admitting: Physician Assistant

## 2013-09-22 DIAGNOSIS — F419 Anxiety disorder, unspecified: Secondary | ICD-10-CM | POA: Diagnosis present

## 2013-09-22 DIAGNOSIS — I214 Non-ST elevation (NSTEMI) myocardial infarction: Secondary | ICD-10-CM

## 2013-09-22 DIAGNOSIS — G4730 Sleep apnea, unspecified: Secondary | ICD-10-CM | POA: Diagnosis present

## 2013-09-22 DIAGNOSIS — I10 Essential (primary) hypertension: Secondary | ICD-10-CM

## 2013-09-22 DIAGNOSIS — E039 Hypothyroidism, unspecified: Secondary | ICD-10-CM | POA: Diagnosis present

## 2013-09-22 MED ORDER — ISOSORBIDE MONONITRATE ER 30 MG PO TB24: 60.0000 mg | ORAL_TABLET | Freq: Every day | ORAL | Status: DC

## 2013-09-22 MED ORDER — CLOPIDOGREL BISULFATE 75 MG PO TABS: 75.0000 mg | ORAL_TABLET | Freq: Every day | ORAL | Status: DC

## 2013-09-22 MED ORDER — ATORVASTATIN CALCIUM 80 MG PO TABS: 80.0000 mg | ORAL_TABLET | Freq: Every day | ORAL | Status: DC

## 2013-09-22 MED ORDER — METOPROLOL SUCCINATE ER 50 MG PO TB24: 50.0000 mg | ORAL_TABLET | Freq: Every day | ORAL | Status: DC

## 2013-09-22 MED ORDER — ASPIRIN EC 81 MG PO TBEC: 81.0000 mg | DELAYED_RELEASE_TABLET | Freq: Every day | ORAL | Status: DC

## 2013-09-22 MED ORDER — NITROGLYCERIN 0.4 MG SL SUBL: 0.4000 mg | SUBLINGUAL_TABLET | SUBLINGUAL | Status: DC | PRN

## 2013-09-22 NOTE — Discharge Instructions (Signed)

## 2013-09-22 NOTE — Discharge Summary (Signed)
Discharge Summary   Patient ID: [name] [name] MRN: [id number], DOB/AGE: 67-Oct-1947 67 y.o. Admit date: 09/20/2013 D/C date:     09/22/2013  Primary Cardiologist: Dr. Harl Bowie ( new )   Principal Problem:   NSTEMI (non-ST elevated myocardial infarction) Active Problems:   HYPERLIPIDEMIA-MIXED   CORONARY ARTERY DISEASE   HTN (hypertension)   Hypothyroidism   Sleep apnea   Anxiety   Admission Dates: 09/20/13-09/22/13 Discharge Diagnosis: NSTEMI s/p [facility] with no intervention and aggressive medical therapy.   HPI: [name] [name] is a 68 y.o. female with a history of hypothyroidism, HTN, OSA, anxiety and no prior cardiac history who was transferred from [facility] to [facility] on 09/20/13 for NSTEMI.  She has been to the emergency room on 3 separate occasions including this admission. On the 2 previous occasions cardiac markers were negative and she was discharged from the hospital. On this occasion, she came in complaining of several hours of chest pressure and her second cardiac marker was significantly elevated at [facility] with a troponin greater than 3. When the discomfort is present she has dyspnea and sweating. Over the time frame of her complaint, there seems to be precipitation by exertional activities. Greater than 10 years ago she had a coronary angiogram performed that revealed nonobstructive coronary disease. There is no prior history of MI, stents, or other cardiac diagnoses other than hypertension. She is nondiabetic. She does not smoke cigarettes.   Hospital Course: She was admitted in transfer for [facility] on 09/20/13  CAD/NSTEMI- peak troponin 10 and trending downwards.  -- Related to occlusion of the distal Cx. The mid to distal obtuse marginal #1 also contains a significant stenosis that is best treated with medical therapy. Patent RCA/LAD with tortuosity. LM patent. EF 55%.  RECOMMENDATION: Medical therapy to include beta blocker, long-acting nitrate, aspirin and Plavix  (30-90 days), and aggressive blood pressure and lipid control. She was originally placed on Imdur 60 mg but she had a significant headache so it was decreased to 30mg  qd. -- 2D ECHO: 09/21/2013; EF 55-60%; mild concentric hypertrophy, G1DD, mild LA dilation, PA pressure 37. Small RV pericardial effusion  HTN - better controlled on Metoprolol succinate 50 mg po qd. This may need to be titrated up as an outpatient per Dr. Harl Bowie at follow up.  Obesity BMI- is 37.15 kg/(m^2).   Hyperlipidemia- her cholesterol is well controlled at baseline. TC 155; TG 63; HDL 55; LDL 87  -- Continue statin in the setting of CAD   Hypothyroidism - continue synthroid   Pulmonary nodules by CT, stable, has f/u with PCP regarding this   Hyperglycemia - should f/u PCP for this, no hx of DM   Sleep apnea- hx consistent with OSA- may need a sleep study as an outpatient  The patient has had an uncomplicated hospital course and is recovering well. The radial catheter site is stable. She has been seen by Dr. Sallyanne Kuster today and deemed ready for discharge home. All follow-up appointments have been scheduled. She has a previously scheduled appointment with Dr. Harl Bowie on 10/01/13 in [place] for follow up.  Discharge medications are listed below.   Discharge Vitals: Blood pressure 157/91, pulse 71, temperature 97.9 F (36.6 C), temperature source Oral, resp. rate 18, height 5' 2.99" (1.6 m), weight 213 lb 3 oz (96.7 kg), SpO2 94.00%.  Labs: Lab Results  Component Value Date   WBC 6.6 09/21/2013   HGB 13.2 09/21/2013   HCT 40.9 09/21/2013   MCV 95.8 09/21/2013  PLT 392 09/21/2013     Recent Labs Lab 09/21/13 0246 09/21/13 0543  NA 140  --   K 4.0  --   CL 105  --   CO2 23  --   BUN 14  --   CREATININE 1.08  --   CALCIUM 8.6  --   PROT  --  6.3  BILITOT  --  0.4  ALKPHOS  --  59  ALT  --  24  AST  --  60*  GLUCOSE 125*  --     Recent Labs  09/20/13 1820 09/21/13 0014 09/21/13 0543  TROPONINI 8.14*  10.35* 8.22*   Lab Results  Component Value Date   CHOL 155 09/21/2013   HDL 55 09/21/2013   LDLCALC 87 09/21/2013   TRIG 63 09/21/2013     Diagnostic Studies/Procedures   Left Heart Catheterization with Coronary Angiography Report  [name] [name]  68 y.o.  female  01/08/1946  Procedure Date: 09/20/2013  Referring Physician: Rosita Fire, MD  Primary Cardiologist: [facility] Blenda Bridegroom, M.D.  INDICATIONS: Non-ST elevation myocardial infarction  PROCEDURE: 1. Left heart catheterization; 2. Coronary angiography; 3. Left ventriculography  CONSENT:  The risks, benefits, and details of the procedure were explained in detail to the patient. Risks including death, stroke, heart attack, kidney injury, allergy, limb ischemia, bleeding and radiation injury were discussed. The patient verbalized understanding and wanted to proceed. Informed written consent was obtained.  PROCEDURE TECHNIQUE: After Xylocaine anesthesia a 5 French Slender sheath was placed in the right radial artery with an angiocath and the modified Seldinger technique. Coronary angiography was done using a 5 F JR 4, JL 3.5 cm diagnostic and 5 [place] EBU 3.5 cm guide catheter. Left ventriculography was done using the JR 4 catheter and hand injection.  Careful review of the images did not reveal an and available lesion related to the patient's non-ST elevation MI. The distal circumflex appears to be the culprit territory where high-grade obstruction is noted prior to several very tiny branches.  CONTRAST: Total of 160 cc.  COMPLICATIONS: None  HEMODYNAMICS: Aortic pressure 145/86 mmHg; LV pressure 147/12 mmHg; LVEDP 18 mm mercury  ANGIOGRAPHIC DATA: The left main coronary artery is is widely patent/normal.  The left anterior descending artery is widely patent. A large trifurcating first diagonal arises proximally. The LAD then trifurcates into a large septal perforator, and anterior interventricular groove branch that wraps around the left  ventricular apex, and a second diagonal branch is small in distribution. Beyond the proximal segment the LAD, and diagonal branches are highly tortuous but no significant obstruction is seen in the main vessels or branches..  The left circumflex artery is nondominant and gives origin to a large first obtuse marginal which contains moderate to severe mid vessel disease within a region of tortuosity. The mid to distal portion of this marginal contains 60 -80% obstruction that is best treated with medical therapy. The continuation of the circumflex is severely diseased and then totally occluded distally With very small branches noted to fill late by collaterals and antegrade flow, TIMI grade 2. The distribution of the distal circumflex is small and not treatable with PCI.  The right coronary artery is very tortuous, dominant vessel. No obstruction is noted.  LEFT VENTRICULOGRAM: Left ventricular angiogram was done in the 30 RAO projection and revealed normal cavity size and contractility with an estimated EF of 55%  IMPRESSIONS: 1. Non-ST elevation myocardial infarction related to occlusion of the distal circumflex. The mid to  distal obtuse marginal #1 also contains a significant stenosis that is best treated with medical therapy .  2. Widely patent RCA, and LAD. Marked tortuosity is noted in both vascular territories. The left main is widely patent.  3. Overall normal left ventricular function with an estimated ejection fraction of 55%  RECOMMENDATION: Medical therapy to include beta blocker, long-acting nitrate, aspirin and Plavix (30-90 days), and aggressive blood pressure and lipid control.   2D ECHO: 09/21/2013  LV EF: 55% - 60% Study Conclusions - Left ventricle: The cavity size was normal. There was mild concentric hypertrophy. Systolic function was normal. The estimated ejection fraction was in the range of 55% to 60%. Wall motion was normal; there were no regional wall motion abnormalities.  Doppler parameters are consistent with abnormal left ventricular relaxation (grade 1 diastolic dysfunction). - Left atrium: The atrium was mildly dilated. - Pulmonary arteries: Systolic pressure was mildly increased. PA peak pressure: 37 mm Hg (S). - Pericardium, extracardiac: A small pericardial effusion was identified along the right ventricular free wall.     Discharge Medications     Medication List    STOP taking these medications       labetalol 200 MG tablet  Commonly known as:  NORMODYNE      TAKE these medications       aspirin EC 81 MG tablet  Take 1 tablet (81 mg total) by mouth daily.     atorvastatin 80 MG tablet  Commonly known as:  LIPITOR  Take 1 tablet (80 mg total) by mouth daily at 6 PM.     clopidogrel 75 MG tablet  Commonly known as:  PLAVIX  Take 1 tablet (75 mg total) by mouth daily with breakfast.     isosorbide mononitrate 30 MG 24 hr tablet  Commonly known as:  IMDUR  Take 2 tablets (60 mg total) by mouth daily.     levothyroxine 100 MCG tablet  Commonly known as:  SYNTHROID, LEVOTHROID  Take 100 mcg by mouth daily before breakfast.     metoprolol succinate 50 MG 24 hr tablet  Commonly known as:  TOPROL-XL  Take 1 tablet (50 mg total) by mouth daily. Take with or immediately following a meal.     multivitamin with minerals Tabs tablet  Take 1 tablet by mouth daily.     nitroGLYCERIN 0.4 MG SL tablet  Commonly known as:  NITROSTAT  Place 1 tablet (0.4 mg total) under the tongue every 5 (five) minutes x 3 doses as needed for chest pain.     Turmeric Curcumin Caps  Take 1 capsule by mouth daily as needed (headache).     vitamin C 1000 MG tablet  Take 1,000 mg by mouth daily.        Disposition   The patient will be discharged in stable condition to home. Discharge Instructions   Amb Referral to Cardiac Rehabilitation    Complete by:  As directed           Follow-up Information   Follow up with Arnoldo Lenis, MD On  10/01/2013. (@ 8:20 am )    Specialty:  Cardiology   Contact information:   [place] [address] [phone]       Follow up with [facility], MD. (Follow up with PCP in the next month. Please address the pulmonary noduldes seen on your CT scan as well as high blood sugars)    Specialty:  Internal Medicine   Contact information:  [address] [place] [address] [phone]         Duration of Discharge Encounter: Greater than 30 minutes including physician and PA time.  Tretha Sciara, KATHRYN PA-C 09/22/2013, 8:39 AM

## 2013-09-22 NOTE — Progress Notes (Signed)
Patient Name: [name] [name] Date of Encounter: 09/22/2013     Active Problems:   NSTEMI (non-ST elevated myocardial infarction)    SUBJECTIVE  No CP. ReAdy to go home.   CURRENT MEDS . aspirin  81 mg Oral Daily  . atorvastatin  80 mg Oral q1800  . clopidogrel  75 mg Oral Q breakfast  . enoxaparin (LOVENOX) injection  95 mg Subcutaneous Q12H  . isosorbide mononitrate  60 mg Oral Daily  . levothyroxine  100 mcg Oral QAC breakfast  . metoprolol succinate  50 mg Oral Daily  . multivitamin with minerals  1 tablet Oral Daily    OBJECTIVE  Filed Vitals:   09/21/13 1641 09/21/13 2013 09/22/13 0007 09/22/13 0416  BP: 125/72 126/52 126/54 139/70  Pulse: 61 79 67 64  Temp: 98.5 F (36.9 C) 99 F (37.2 C) 98.9 F (37.2 C) 98.9 F (37.2 C)  TempSrc: Oral Oral Oral Oral  Resp: 18 18 18 18   Height:      Weight:   213 lb 3 oz (96.7 kg)   SpO2: 97% 95% 92% 92%    Intake/Output Summary (Last 24 hours) at 09/22/13 0739 Last data filed at 09/22/13 0432  Gross per 24 hour  Intake   1240 ml  Output   2550 ml  Net  -1310 ml   Filed Weights   09/20/13 1943 09/21/13 0014 09/22/13 0007  Weight: 205 lb 0.4 oz (93 kg) 209 lb 10.5 oz (95.1 kg) 213 lb 3 oz (96.7 kg)    PHYSICAL EXAM  General: Pleasant, NAD. Neuro: Alert and oriented X 3. Moves all extremities spontaneously. Psych: Normal affect. HEENT:  Normal  Neck: Supple without bruits or JVD. Lungs:  Resp regular and unlabored, CTA. Heart: RRR no s3, s4, or murmurs. Abdomen: Soft, non-tender, non-distended, BS + x 4.  Extremities: No clubbing, cyanosis or edema. DP/PT/Radials 2+ and equal bilaterally.  Accessory Clinical Findings  CBC  Recent Labs  09/21/13 0246  WBC 6.6  HGB 13.2  HCT 40.9  MCV 95.8  PLT 017   Basic Metabolic Panel  Recent Labs  09/21/13 0246  NA 140  K 4.0  CL 105  CO2 23  GLUCOSE 125*  BUN 14  CREATININE 1.08  CALCIUM 8.6   Liver Function Tests  Recent Labs   09/21/13 0543  AST 60*  ALT 24  ALKPHOS 59  BILITOT 0.4  PROT 6.3  ALBUMIN 3.1*   Cardiac Enzymes  Recent Labs  09/20/13 1820 09/21/13 0014 09/21/13 0543  TROPONINI 8.14* 10.35* 8.22*   Fasting Lipid Panel  Recent Labs  09/21/13 0246  CHOL 155  HDL 55  LDLCALC 87  TRIG 63  CHOLHDL 2.8    TELE  NSR  Radiology/Studies  Left Heart Catheterization with Coronary Angiography Report  [name] [name]  68 y.o.  female  1945/09/27  Procedure Date: 09/20/2013  Referring Physician: Rosita Fire, MD  Primary Cardiologist: [facility] Blenda Bridegroom, M.D.  INDICATIONS: Non-ST elevation myocardial infarction  PROCEDURE: 1. Left heart catheterization; 2. Coronary angiography; 3. Left ventriculography  CONSENT:  The risks, benefits, and details of the procedure were explained in detail to the patient. Risks including death, stroke, heart attack, kidney injury, allergy, limb ischemia, bleeding and radiation injury were discussed. The patient verbalized understanding and wanted to proceed. Informed written consent was obtained.  PROCEDURE TECHNIQUE: After Xylocaine anesthesia a 5 French Slender sheath was placed in the right radial artery with an angiocath and the modified Seldinger  technique. Coronary angiography was done using a 5 F JR 4, JL 3.5 cm diagnostic and 5 [place] EBU 3.5 cm guide catheter. Left ventriculography was done using the JR 4 catheter and hand injection.  Careful review of the images did not reveal an and available lesion related to the patient's non-ST elevation MI. The distal circumflex appears to be the culprit territory where high-grade obstruction is noted prior to several very tiny branches.  CONTRAST: Total of 160 cc.  COMPLICATIONS: None  HEMODYNAMICS: Aortic pressure 145/86 mmHg; LV pressure 147/12 mmHg; LVEDP 18 mm mercury  ANGIOGRAPHIC DATA: The left main coronary artery is is widely patent/normal.  The left anterior descending artery is widely patent. A large  trifurcating first diagonal arises proximally. The LAD then trifurcates into a large septal perforator, and anterior interventricular groove branch that wraps around the left ventricular apex, and a second diagonal branch is small in distribution. Beyond the proximal segment the LAD, and diagonal branches are highly tortuous but no significant obstruction is seen in the main vessels or branches..  The left circumflex artery is nondominant and gives origin to a large first obtuse marginal which contains moderate to severe mid vessel disease within a region of tortuosity. The mid to distal portion of this marginal contains 60 -80% obstruction that is best treated with medical therapy. The continuation of the circumflex is severely diseased and then totally occluded distally With very small branches noted to fill late by collaterals and antegrade flow, TIMI grade 2. The distribution of the distal circumflex is small and not treatable with PCI.  The right coronary artery is very tortuous, dominant vessel. No obstruction is noted.  LEFT VENTRICULOGRAM: Left ventricular angiogram was done in the 30 RAO projection and revealed normal cavity size and contractility with an estimated EF of 55%  IMPRESSIONS: 1. Non-ST elevation myocardial infarction related to occlusion of the distal circumflex. The mid to distal obtuse marginal #1 also contains a significant stenosis that is best treated with medical therapy .  2. Widely patent RCA, and LAD. Marked tortuosity is noted in both vascular territories. The left main is widely patent.  3. Overall normal left ventricular function with an estimated ejection fraction of 55%  RECOMMENDATION: Medical therapy to include beta blocker, long-acting nitrate, aspirin and Plavix (30-90 days), and aggressive blood pressure and lipid control.   ASSESSMENT AND PLAN  [name] [name] is a 68 y.o. female with a history of hypothyroidism, HTN, OSA, anxiety and no prior cardiac history  who was transferred from [facility] to [facility] on 09/20/13 for NSTEMI.  CAD/NSTEMI- peak troponin 10 and trending downwards. -- Related to occlusion of the distal Cx. The mid to distal obtuse marginal #1 also contains a significant stenosis that is best treated with medical therapy. Patent RCA/LAD with tortuosity. LM patent. EF 55%.  RECOMMENDATION: Medical therapy to include beta blocker, long-acting nitrate, aspirin and Plavix (30-90 days), and aggressive blood pressure and lipid control.  HTN - better controlled on Metoprolol succinate 50 mg po qd.   Obesity BMI- is 37.15 kg/(m^2).   Hyperlipidemia- her cholesterol is well controlled at baseline. TC 155; TG 63; HDL 55; LDL 87 -- Continue statin in the setting of CAD  Hypothyroidism - continue synthroid  Pulmonary nodules by CT, stable, has f/u with PCP regarding this   Hyperglycemia - should f/u PCP for this, no hx of DM  Dispo- likely will go home today. She has a previously scheduled appointment with Dr. Harl Bowie on 10/01/13  in [place] for follow up.   Tyrell Antonio PA-C  Pager [phone]  I have seen and examined the patient along with Perry Mount PA-C.  I have reviewed the chart, notes and new data.  I agree with PA's note.  Key new complaints: headache, no angina Key examination changes: no arrhythmia, signs of HF or problems at cath  Key new findings / data: BP slightly high today, but was normal all day yesterday. [place] BP meds as outpt.  PLAN: DC home. F/U with Dr. Harl Bowie. Decrease nitrate dose.  Sanda Klein, MD, [place] [phone] 09/22/2013, 8:47 AM

## 2013-09-22 NOTE — Progress Notes (Signed)
CARDIAC REHAB PHASE I   PRE:  Rate/Rhythm: 73 SR  BP:  Supine:   Sitting: 157/91  Standing:    SaO2: 96 RA  MODE:  Ambulation: 1200 ft   POST:  Rate/Rhythm: 96 SR  BP:  Supine:   Sitting: 160/90  Standing:    SaO2: 96 RA Tolerated ambulation well independently without angina or complaints.  Reinforced angina symptoms, NTG usage, and phase II referral to [place] cardiac rehab.  For d/c today. [place] RN  Liliane Channel RN, BSN 09/22/2013 8:23 AM

## 2013-09-26 ENCOUNTER — Emergency Department (HOSPITAL_COMMUNITY): Payer: Medicare HMO

## 2013-09-26 ENCOUNTER — Inpatient Hospital Stay (HOSPITAL_COMMUNITY)
Admission: EM | Admit: 2013-09-26 | Discharge: 2013-09-30 | DRG: 282 | Disposition: A | Discharge status: Home / Self Care | Payer: Medicare HMO | Attending: Cardiovascular Disease | Admitting: Cardiovascular Disease

## 2013-09-26 ENCOUNTER — Encounter (HOSPITAL_COMMUNITY): Payer: Self-pay | Admitting: Emergency Medicine

## 2013-09-26 DIAGNOSIS — Z6837 Body mass index (BMI) 37.0-37.9, adult: Secondary | ICD-10-CM

## 2013-09-26 DIAGNOSIS — E039 Hypothyroidism, unspecified: Secondary | ICD-10-CM

## 2013-09-26 DIAGNOSIS — Z7902 Long term (current) use of antithrombotics/antiplatelets: Secondary | ICD-10-CM

## 2013-09-26 DIAGNOSIS — I214 Non-ST elevation (NSTEMI) myocardial infarction: Principal | ICD-10-CM | POA: Diagnosis present

## 2013-09-26 DIAGNOSIS — I10 Essential (primary) hypertension: Secondary | ICD-10-CM

## 2013-09-26 DIAGNOSIS — E119 Type 2 diabetes mellitus without complications: Secondary | ICD-10-CM | POA: Diagnosis present

## 2013-09-26 DIAGNOSIS — Z79899 Other long term (current) drug therapy: Secondary | ICD-10-CM

## 2013-09-26 DIAGNOSIS — R079 Chest pain, unspecified: Secondary | ICD-10-CM | POA: Diagnosis present

## 2013-09-26 DIAGNOSIS — G4733 Obstructive sleep apnea (adult) (pediatric): Secondary | ICD-10-CM | POA: Diagnosis present

## 2013-09-26 DIAGNOSIS — Z9861 Coronary angioplasty status: Secondary | ICD-10-CM

## 2013-09-26 DIAGNOSIS — E785 Hyperlipidemia, unspecified: Secondary | ICD-10-CM | POA: Diagnosis present

## 2013-09-26 DIAGNOSIS — Z7982 Long term (current) use of aspirin: Secondary | ICD-10-CM

## 2013-09-26 DIAGNOSIS — R918 Other nonspecific abnormal finding of lung field: Secondary | ICD-10-CM | POA: Diagnosis present

## 2013-09-26 DIAGNOSIS — E669 Obesity, unspecified: Secondary | ICD-10-CM | POA: Diagnosis present

## 2013-09-26 DIAGNOSIS — I2510 Atherosclerotic heart disease of native coronary artery without angina pectoris: Secondary | ICD-10-CM | POA: Diagnosis present

## 2013-09-26 DIAGNOSIS — F411 Generalized anxiety disorder: Secondary | ICD-10-CM | POA: Diagnosis present

## 2013-09-26 LAB — TROPONIN I
TROPONIN I: 0.42 ng/mL — AB (ref <0.30)
TROPONIN I: 2.14 ng/mL — AB (ref <0.30)

## 2013-09-26 LAB — BASIC METABOLIC PANEL
Anion gap: 16 — ABNORMAL HIGH (ref 5–15)
BUN: 21 mg/dL (ref 6–23)
CALCIUM: 9.8 mg/dL (ref 8.4–10.5)
CO2: 25 meq/L (ref 19–32)
Chloride: 101 mEq/L (ref 96–112)
Creatinine, Ser: 1.11 mg/dL — ABNORMAL HIGH (ref 0.50–1.10)
GFR calc Af Amer: 58 mL/min — ABNORMAL LOW (ref >90)
GFR, EST NON AFRICAN AMERICAN: 50 mL/min — AB (ref >90)
GLUCOSE: 85 mg/dL (ref 70–99)
Potassium: 4.3 mEq/L (ref 3.7–5.3)
SODIUM: 142 meq/L (ref 137–147)

## 2013-09-26 LAB — CBC
HCT: 44.6 % (ref 36.0–46.0)
HEMOGLOBIN: 15.1 g/dL — AB (ref 12.0–15.0)
MCH: 31.6 pg (ref 26.0–34.0)
MCHC: 33.9 g/dL (ref 30.0–36.0)
MCV: 93.3 fL (ref 78.0–100.0)
Platelets: 451 10*3/uL — ABNORMAL HIGH (ref 150–400)
RBC: 4.78 MIL/uL (ref 3.87–5.11)
RDW: 15.6 % — ABNORMAL HIGH (ref 11.5–15.5)
WBC: 7.2 10*3/uL (ref 4.0–10.5)

## 2013-09-26 LAB — MRSA PCR SCREENING: MRSA by PCR: NEGATIVE

## 2013-09-26 LAB — PROTIME-INR
INR: 1.17 (ref 0.00–1.49)
Prothrombin Time: 14.9 seconds (ref 11.6–15.2)

## 2013-09-26 LAB — MAGNESIUM: Magnesium: 2.2 mg/dL (ref 1.5–2.5)

## 2013-09-26 MED ORDER — ACETAMINOPHEN 500 MG PO TABS
1000.0000 mg | ORAL_TABLET | Freq: Once | ORAL | Status: DC
Start: 2013-09-26 — End: 2013-09-26

## 2013-09-26 MED ORDER — ONDANSETRON HCL 4 MG/2ML IJ SOLN: 4.0000 mg | Freq: Four times a day (QID) | INTRAMUSCULAR | Status: DC | PRN

## 2013-09-26 MED ORDER — ASPIRIN 300 MG RE SUPP
300.0000 mg | RECTAL | Status: AC
  Filled 2013-09-26: qty 1

## 2013-09-26 MED ORDER — SODIUM CHLORIDE 0.9 % IV SOLN: 250.0000 mL | INTRAVENOUS | Status: DC | PRN

## 2013-09-26 MED ORDER — MORPHINE SULFATE 4 MG/ML IJ SOLN
4.0000 mg | Freq: Once | INTRAMUSCULAR | Status: AC
  Administered 2013-09-26: 4 mg via INTRAVENOUS
  Filled 2013-09-26: qty 1

## 2013-09-26 MED ORDER — OXYCODONE-ACETAMINOPHEN 5-325 MG PO TABS
2.0000 | ORAL_TABLET | Freq: Once | ORAL | Status: AC
  Administered 2013-09-26: 2 via ORAL
  Filled 2013-09-26: qty 2

## 2013-09-26 MED ORDER — NITROGLYCERIN IN D5W 200-5 MCG/ML-% IV SOLN
10.0000 ug/min | INTRAVENOUS | Status: DC
  Administered 2013-09-26: 15 ug/min via INTRAVENOUS
  Filled 2013-09-26: qty 250

## 2013-09-26 MED ORDER — ATORVASTATIN CALCIUM 80 MG PO TABS
80.0000 mg | ORAL_TABLET | Freq: Every day | ORAL | Status: DC
  Administered 2013-09-27 – 2013-09-29 (×3): 80 mg via ORAL
  Filled 2013-09-26 (×5): qty 1

## 2013-09-26 MED ORDER — INSULIN ASPART 100 UNIT/ML ~~LOC~~ SOLN: 0.0000 [IU] | Freq: Three times a day (TID) | SUBCUTANEOUS | Status: DC

## 2013-09-26 MED ORDER — ASPIRIN EC 81 MG PO TBEC
81.0000 mg | DELAYED_RELEASE_TABLET | Freq: Every day | ORAL | Status: DC
  Administered 2013-09-27: 81 mg via ORAL
  Filled 2013-09-26: qty 1

## 2013-09-26 MED ORDER — HEPARIN (PORCINE) IN NACL 100-0.45 UNIT/ML-% IJ SOLN
900.0000 [IU]/h | INTRAMUSCULAR | Status: DC
  Administered 2013-09-26: 900 [IU]/h via INTRAVENOUS
  Filled 2013-09-26: qty 250

## 2013-09-26 MED ORDER — NITROGLYCERIN 0.4 MG SL SUBL: 0.4000 mg | SUBLINGUAL_TABLET | SUBLINGUAL | Status: DC | PRN

## 2013-09-26 MED ORDER — NITROGLYCERIN 0.4 MG SL SUBL
0.4000 mg | SUBLINGUAL_TABLET | SUBLINGUAL | Status: DC | PRN
  Filled 2013-09-26: qty 1

## 2013-09-26 MED ORDER — SODIUM CHLORIDE 0.9 % IJ SOLN
3.0000 mL | Freq: Two times a day (BID) | INTRAMUSCULAR | Status: DC
  Administered 2013-09-27 – 2013-09-30 (×4): 3 mL via INTRAVENOUS

## 2013-09-26 MED ORDER — MORPHINE SULFATE 4 MG/ML IJ SOLN
8.0000 mg | Freq: Once | INTRAMUSCULAR | Status: AC
  Administered 2013-09-26: 8 mg via INTRAVENOUS
  Filled 2013-09-26: qty 2

## 2013-09-26 MED ORDER — HEPARIN BOLUS VIA INFUSION
4000.0000 [IU] | Freq: Once | INTRAVENOUS | Status: AC
  Administered 2013-09-26: 4000 [IU] via INTRAVENOUS
  Filled 2013-09-26: qty 4000

## 2013-09-26 MED ORDER — METOPROLOL SUCCINATE ER 50 MG PO TB24
50.0000 mg | ORAL_TABLET | Freq: Every day | ORAL | Status: DC
  Administered 2013-09-27 – 2013-09-30 (×4): 50 mg via ORAL
  Filled 2013-09-26 (×4): qty 1

## 2013-09-26 MED ORDER — ASPIRIN 81 MG PO TABS: 81.0000 mg | ORAL_TABLET | Freq: Every day | ORAL | Status: DC

## 2013-09-26 MED ORDER — CLOPIDOGREL BISULFATE 75 MG PO TABS
75.0000 mg | ORAL_TABLET | Freq: Every day | ORAL | Status: DC
  Administered 2013-09-27 – 2013-09-30 (×4): 75 mg via ORAL
  Filled 2013-09-26 (×4): qty 1

## 2013-09-26 MED ORDER — ASPIRIN 81 MG PO CHEW: 324.0000 mg | CHEWABLE_TABLET | ORAL | Status: AC

## 2013-09-26 MED ORDER — ACETAMINOPHEN 325 MG PO TABS
650.0000 mg | ORAL_TABLET | ORAL | Status: DC | PRN
  Administered 2013-09-27: 650 mg via ORAL
  Filled 2013-09-26: qty 2

## 2013-09-26 MED ORDER — LEVOTHYROXINE SODIUM 100 MCG PO TABS
100.0000 ug | ORAL_TABLET | Freq: Every day | ORAL | Status: DC
  Administered 2013-09-27 – 2013-09-30 (×4): 100 ug via ORAL
  Filled 2013-09-26 (×5): qty 1

## 2013-09-26 MED ORDER — SODIUM CHLORIDE 0.9 % IJ SOLN: 3.0000 mL | INTRAMUSCULAR | Status: DC | PRN

## 2013-09-26 MED ORDER — ISOSORBIDE DINITRATE 30 MG PO TABS
30.0000 mg | ORAL_TABLET | Freq: Two times a day (BID) | ORAL | Status: DC
  Filled 2013-09-26: qty 1

## 2013-09-26 NOTE — ED Notes (Signed)
Per [facility]: pt was at home sitting down when she developed sharp stabbing 10/10 right sided cp with radiation to left arm. Pt reports nausea, sob, and diaphoresis. Pt recently discharged from hospital  Saturday after angioplasty done. EMS gave 3 nitro with no pain relief, pt also given 324mg  of aspirin.Pt moaning and tearful upon arrival. EMS noted unremarkable EKG.

## 2013-09-26 NOTE — Progress Notes (Signed)
ANTICOAGULATION CONSULT NOTE - Initial Consult  Pharmacy Consult for heparin Indication: chest pain/ACS  No Known Allergies  Patient Measurements: Height: 5\' 3"  (160 cm) Weight: 209 lb (94.802 kg) IBW/kg (Calculated) : 52.4 Heparin Dosing Weight: 74 kg  Vital Signs: Temp: 97.7 F (36.5 C) (08/19 1633) Temp src: Oral (08/19 1633) BP: 152/80 mmHg (08/19 1900) Pulse Rate: 90 (08/19 1915)  Labs:  Recent Labs  09/26/13 1707  HGB 15.1*  HCT 44.6  PLT 451*  CREATININE 1.11*  TROPONINI 0.42*    Estimated Creatinine Clearance: 53.9 ml/min (by C-G formula based on Cr of 1.11).   Medical History: Past Medical History  Diagnosis Date  . Hypertension   . Anxiety   . Sleep apnea   . Hypothyroidism   . NSTEMI (non-ST elevated myocardial infarction)     a.  Related to occlusion of the distal Cx. The mid to distal obtuse marginal #1 also contains a significant stenosis that is best treated with medical therapy. Patent RCA/LAD with tortuosity. LM patent. EF 55%.   [name] History of echocardiogram     a. 2D ECHO: 09/21/2013; EF 55-60%; mild concentric hypertrophy, G1DD, mild LA dilation, PA pressure 37. Small RV pericardial effusion    Medications:  See electronic PTA med list  Assessment: 68 y/o female with recent NSTEMI who was discharged on 8/15. She presents to the ED with chest pain and found to have positive troponin (0.42) concerning for new or residual NSTEMI. Pharmacy consulted to begin IV heparin. No bleeding noted, CBC is normal.  Goal of Therapy:  Heparin level 0.3-0.7 units/ml Monitor platelets by anticoagulation protocol: Yes   Plan:  - Heparin 4000 units IV bolus then infuse at 900 units/hr - 6 hr heparin level - Daily heparin level and CBC - Monitor for s/sx of bleeding  [facility], [place].D., BCPS Clinical Pharmacist Pager: [phone] 09/26/2013 7:33 PM

## 2013-09-26 NOTE — ED Provider Notes (Signed)
CSN: [id number]     Arrival date & time 09/26/13  45 History   First MD Initiated Contact with Patient 09/26/13 1637     Chief Complaint  Patient presents with  . Chest Pain     (Consider location/radiation/quality/duration/timing/severity/associated sxs/prior Treatment) HPI 68 year old female presents with chest pain that started about 1-1/2 hours prior to arrival. She states this pain feels similar to when she had an MI just a few days ago. At that time she had a cardiac cath that showed vessel occlusion not amenable to stenting. Cardiology is planning to treat with medical therapy. The patient states his pain is very similar to the patient had prior. She endorses mostly a heavy type pain. Denies shortness of breath. Has had some nausea and sweating. Took 2 nitroglycerin without any relief. EMS gave one more that did not help. She was given 324 mg aspirin by EMS. Patient currently rates her pain as a 10/10.  Past Medical History  Diagnosis Date  . Hypertension   . Anxiety   . Sleep apnea   . Hypothyroidism   . NSTEMI (non-ST elevated myocardial infarction)     a.  Related to occlusion of the distal Cx. The mid to distal obtuse marginal #1 also contains a significant stenosis that is best treated with medical therapy. Patent RCA/LAD with tortuosity. LM patent. EF 55%.   [name] History of echocardiogram     a. 2D ECHO: 09/21/2013; EF 55-60%; mild concentric hypertrophy, G1DD, mild LA dilation, PA pressure 37. Small RV pericardial effusion   Past Surgical History  Procedure Laterality Date  . Thyroidectomy  10/08/2011    Procedure: THYROIDECTOMY;  Surgeon: [name], MD;  Location: [facility];  Service: General;  Laterality: N/A;  Total  . Cardiac catheterization  ~ 2000  . Coronary angioplasty  09/20/2013  . Vaginal hysterectomy  1993   No family history on file. History  Substance Use Topics  . Smoking status: Never Smoker   . Smokeless tobacco: Never Used  . Alcohol Use: No   OB  History   Grav Para Term Preterm Abortions TAB SAB Ect Mult Living   2 2 2             Review of Systems  Constitutional: Negative for fever.  Respiratory: Negative for shortness of breath.   Cardiovascular: Positive for chest pain. Negative for leg swelling.  Gastrointestinal: Positive for nausea. Negative for vomiting and abdominal pain.  All other systems reviewed and are negative.     Allergies  Review of patient's allergies indicates no known allergies.  Home Medications   Prior to Admission medications   Medication Sig Start Date End Date Taking? Authorizing Provider  Ascorbic Acid (VITAMIN C) 1000 MG tablet Take 1,000 mg by mouth daily.    Historical Provider, MD  aspirin EC 81 MG tablet Take 1 tablet (81 mg total) by mouth daily. 09/22/13   Perry Mount, PA-C  atorvastatin (LIPITOR) 80 MG tablet Take 1 tablet (80 mg total) by mouth daily at 6 PM. 09/22/13   Perry Mount, PA-C  clopidogrel (PLAVIX) 75 MG tablet Take 1 tablet (75 mg total) by mouth daily with breakfast. 09/22/13   Perry Mount, PA-C  isosorbide mononitrate (IMDUR) 30 MG 24 hr tablet Take 2 tablets (60 mg total) by mouth daily. 09/22/13   Perry Mount, PA-C  levothyroxine (SYNTHROID, LEVOTHROID) 100 MCG tablet Take 100 mcg by mouth daily before breakfast.    Historical Provider, MD  metoprolol succinate (TOPROL-XL) 50 MG  24 hr tablet Take 1 tablet (50 mg total) by mouth daily. Take with or immediately following a meal. 09/22/13   Perry Mount, PA-C  Misc Natural Products (TURMERIC CURCUMIN) CAPS Take 1 capsule by mouth daily as needed (headache).    Historical Provider, MD  Multiple Vitamin (MULTIVITAMIN WITH MINERALS) TABS Take 1 tablet by mouth daily.    Historical Provider, MD  nitroGLYCERIN (NITROSTAT) 0.4 MG SL tablet Place 1 tablet (0.4 mg total) under the tongue every 5 (five) minutes x 3 doses as needed for chest pain. 09/22/13   Perry Mount, PA-C   BP 167/90  Pulse 87  Temp(Src) 97.7 F (36.5 C)  (Oral)  Resp 20  Ht 5\' 3"  (1.6 m)  Wt 209 lb (94.802 kg)  BMI 37.03 kg/m2  SpO2 96% Physical Exam  Nursing note and vitals reviewed. Constitutional: She is oriented to person, place, and time. She appears well-developed and well-nourished.  Tearful, moaning and shaking head back and forth  HENT:  Head: Normocephalic and atraumatic.  Right Ear: External ear normal.  Left Ear: External ear normal.  Nose: Nose normal.  Eyes: Right eye exhibits no discharge. Left eye exhibits no discharge.  Cardiovascular: Normal rate, regular rhythm and normal heart sounds.   Pulmonary/Chest: Effort normal and breath sounds normal. She exhibits no tenderness.  Abdominal: Soft. She exhibits no distension. There is no tenderness.  Musculoskeletal: She exhibits no edema.  Right wrist with occlusive draining in place  Neurological: She is alert and oriented to person, place, and time.  Skin: Skin is warm and dry.    ED Course  Procedures (including critical care time) Labs Review Labs Reviewed  CBC - Abnormal; Notable for the following:    Hemoglobin 15.1 (*)    RDW 15.6 (*)    Platelets 451 (*)    All other components within normal limits  BASIC METABOLIC PANEL - Abnormal; Notable for the following:    Creatinine, Ser 1.11 (*)    GFR calc non Af Amer 50 (*)    GFR calc Af Amer 58 (*)    Anion gap 16 (*)    All other components within normal limits  TROPONIN I - Abnormal; Notable for the following:    Troponin I 0.42 (*)    All other components within normal limits    Imaging Review Dg Chest Port 1 View  09/26/2013   CLINICAL DATA:  Chest pain  EXAM: PORTABLE CHEST - 1 VIEW  COMPARISON:  09/20/2013  FINDINGS: Cardiomegaly. No overt edema. No confluent opacities or effusions. Mild tortuosity and ectasia of the thoracic aorta. No acute bony abnormality.  IMPRESSION: Cardiomegaly.  No acute findings.   Electronically Signed   By: Rolm Baptise M.D.   On: 09/26/2013 17:15     EKG  Interpretation   Date/Time:  Wednesday September 26 2013 17:25:56 EDT Ventricular Rate:  78 PR Interval:  161 QRS Duration: 73 QT Interval:  403 QTC Calculation: 459 R Axis:   26 Text Interpretation:  Sinus rhythm Anteroseptal infarct, old No  significant change since last tracing Confirmed by Theodor Mustin  MD, Lothar Prehn  ([id number]) on 09/26/2013 5:28:57 PM      CRITICAL CARE Performed by: Sherwood Gambler T   Total critical care time: 30 minutes  Critical care time was exclusive of separately billable procedures and treating other patients.  Critical care was necessary to treat or prevent imminent or life-threatening deterioration.  Critical care was time spent personally by me on the following activities:  development of treatment plan with patient and/or surrogate as well as nursing, discussions with consultants, evaluation of patient's response to treatment, examination of patient, obtaining history from patient or surrogate, ordering and performing treatments and interventions, ordering and review of laboratory studies, ordering and review of radiographic studies, pulse oximetry and re-evaluation of patient's condition.  MDM   Final diagnoses:  Essential hypertension  NSTEMI (non-ST elevated myocardial infarction)    Patient with uncontrolled pain after multiple nitroglycerin and morphine. Placed on nitroglycerin drip due to her hypertension and poor pain control. This is likely unstable angina versus NSTEMI. Difficult to interpret her troponin given that it was 8 about 4 days ago. Cardiology has been consult in and they will admit and place on heparin.    Ephraim Hamburger, MD 09/27/13 [phone]

## 2013-09-26 NOTE — H&P (Signed)
Patient ID: [name] [name] MRN: [id number], DOB/AGE: 67-Jun-1947   Admit date: 09/26/2013   Primary Physician: Rosita Fire, MD Primary Cardiologist: Dr. Harl Bowie   Pt. Profile: [name] [name] is a 68 y.o. female with a history of diabetes, hypothyroidism, HTN, OSA, anxiety, CAD s/p recent NSTEMI (discharge 4 days ago on 09/22/13) who presented to [facility] today with chest pain and found to have recurrent or residual NSTEMI ( troponin 0.42).   She has been to the emergency room on 4 separate occasions including this admission. On the first two occasions cardiac markers were negative and she was discharged from the hospital. Last admission she came in complaining of several hours of chest pressure and her second cardiac marker was significantly elevated at [facility] with a troponin greater than 3 and she was transferred to [facility]. Her troponin peaked >10 and was 8.22 on 09/21/13. She underwent LHC which revealed her NSTEMI was related to occlusion of the distal Cx. The mid to distal obtuse marginal #1 also contained a significant stenosis that was though best treated with medical therapy. Patent RCA/LAD with tortuosity. LM patent. EF 55%. She was placed on a beta blocker, long-acting nitrate, aspirin and Plavix (30-90 days), and aggressive blood pressure and lipid control was advised. She was originally placed on Imdur 60 mg but she had a significant headache so it was decreased to 30mg  qd. She underwent 2D ECHO: 09/21/2013; EF 55-60%; mild concentric hypertrophy, G1DD, mild LA dilation, PA pressure 37. Small RV pericardial effusion  She was feeling okay since discharge and having no chest pain. She was at her endocrinologist office today for control of newly diagnosed diabetes. She had an episode of chest pain after her appointment. This was brought down to mild chest pain after SL NTG and NTG gtt. Currently with chest pain. This was worse than her previous episodes.     Problem List  Past Medical  History  Diagnosis Date  . Hypertension   . Anxiety   . Sleep apnea   . Hypothyroidism   . NSTEMI (non-ST elevated myocardial infarction)     a.  Related to occlusion of the distal Cx. The mid to distal obtuse marginal #1 also contains a significant stenosis that is best treated with medical therapy. Patent RCA/LAD with tortuosity. LM patent. EF 55%.   [name] History of echocardiogram     a. 2D ECHO: 09/21/2013; EF 55-60%; mild concentric hypertrophy, G1DD, mild LA dilation, PA pressure 37. Small RV pericardial effusion    Past Surgical History  Procedure Laterality Date  . Thyroidectomy  10/08/2011    Procedure: THYROIDECTOMY;  Surgeon: [name], MD;  Location: [facility];  Service: General;  Laterality: N/A;  Total  . Cardiac catheterization  ~ 2000  . Coronary angioplasty  09/20/2013  . Vaginal hysterectomy  1993     Allergies  No Known Allergies   Home Medications  Prior to Admission medications   Medication Sig Start Date End Date Taking? Authorizing Provider  Ascorbic Acid (VITAMIN C) 1000 MG tablet Take 1,000 mg by mouth daily.   Yes Historical Provider, MD  aspirin 81 MG tablet Take 81 mg by mouth daily.   Yes Historical Provider, MD  atorvastatin (LIPITOR) 80 MG tablet Take 80 mg by mouth daily. 09/22/13  Yes Perry Mount, PA-C  clopidogrel (PLAVIX) 75 MG tablet Take 75 mg by mouth daily.   Yes Historical Provider, MD  isosorbide dinitrate (ISORDIL) 30 MG tablet Take 30 mg by mouth 2 (  two) times daily.   Yes Historical Provider, MD  levothyroxine (SYNTHROID, LEVOTHROID) 100 MCG tablet Take 100 mcg by mouth daily.   Yes Historical Provider, MD  metoprolol succinate (TOPROL-XL) 50 MG 24 hr tablet Take 50 mg by mouth daily. Take with or immediately following a meal.   Yes Historical Provider, MD  Misc Natural Products (TURMERIC CURCUMIN) CAPS Take 1 capsule by mouth daily as needed. Headache   Yes Historical Provider, MD  Multiple Vitamin (MULTIVITAMIN WITH MINERALS) TABS Take 1  tablet by mouth daily.   Yes Historical Provider, MD  nitroGLYCERIN (NITROSTAT) 0.4 MG SL tablet Place 0.4 mg under the tongue every 5 (five) minutes x 3 doses as needed for chest pain. 09/22/13  Yes Perry Mount, PA-C    Family History  No family history on file. No family status information on file.     Social History  History   Social History  . Marital Status: Widowed    Spouse Name: N/A    Number of Children: N/A  . Years of Education: N/A   Occupational History  . Not on file.   Social History Main Topics  . Smoking status: Never Smoker   . Smokeless tobacco: Never Used  . Alcohol Use: No  . Drug Use: No  . Sexual Activity: No   Other Topics Concern  . Not on file   Social History Narrative  . No narrative on file     All other systems reviewed and are otherwise negative except as noted above.  Physical Exam  Blood pressure 143/77, pulse 80, temperature 97.7 F (36.5 C), temperature source Oral, resp. rate 19, height 5\' 3"  (1.6 m), weight 209 lb (94.802 kg), SpO2 95.00%.  General: Pleasant, NAD. Appears in pain. Obese. Psych: Normal affect. Neuro: Alert and oriented X 3. Moves all extremities spontaneously. HEENT: Normal  Neck: Supple without bruits or JVD. Lungs:  Resp regular and unlabored, CTA. Heart: RRR no s3, s4, or murmurs. Abdomen: Soft, non-tender, non-distended, BS + x 4.  Extremities: No clubbing, cyanosis or edema. DP/PT/Radials 2+ and equal bilaterally.  Labs   Recent Labs  09/26/13 1707  TROPONINI 0.42*   Lab Results  Component Value Date   WBC 7.2 09/26/2013   HGB 15.1* 09/26/2013   HCT 44.6 09/26/2013   MCV 93.3 09/26/2013   PLT 451* 09/26/2013    Recent Labs Lab 09/21/13 0543 09/26/13 1707  NA  --  142  K  --  4.3  CL  --  101  CO2  --  25  BUN  --  21  CREATININE  --  1.11*  CALCIUM  --  9.8  PROT 6.3  --   BILITOT 0.4  --   ALKPHOS 59  --   ALT 24  --   AST 60*  --   GLUCOSE  --  85   Lab Results  Component  Value Date   CHOL 155 09/21/2013   HDL 55 09/21/2013   LDLCALC 87 09/21/2013   TRIG 63 09/21/2013      Radiology/Studies  Dg Chest Port 1 View  09/26/2013   CLINICAL DATA:  Chest pain  EXAM: PORTABLE CHEST - 1 VIEW  COMPARISON:  09/20/2013  FINDINGS: Cardiomegaly. No overt edema. No confluent opacities or effusions. Mild tortuosity and ectasia of the thoracic aorta. No acute bony abnormality.  IMPRESSION: Cardiomegaly.  No acute findings.   Electronically Signed   By: Rolm Baptise M.D.   On: 09/26/2013 17:15  ECG  Sinus rhythm Anteroseptal infarct, old No significant change since last tracing  ASSESSMENT AND PLAN   QUANESHIA WAREING is a 68 y.o. female with a history of diabetes, hypothyroidism, HTN, OSA, anxiety, CAD s/p recent NSTEMI (discharge 4 days ago on 09/22/13) who presented to [facility] today with chest pain and found to have recurrent NSTEMI ( troponin 0.42).   CAD/NSTEMI- troponin today 0.44. Hard to say if this is new or residual from recent NSTEMI last week. Her troponin peaked >10 and was 8.22 on 09/21/13. She underwent LHC on 09/20/13 which revealed her NSTEMI was related to occlusion of the distal Cx. The mid to distal obtuse marginal #1 also contained a significant stenosis that was though best treated with medical therapy. Patent RCA/LAD with tortuosity. LM patent. EF 55%.  -- Continue BB, aspirin, imdur and Plavix (30-90 days) -- 2D ECHO: 09/21/2013; EF 55-60%; mild concentric hypertrophy, G1DD, mild LA dilation, PA pressure 37. Small RV pericardial effusion -- Continue to cycle enzymes and place on heparin gtt. Currently with 2/10 chest pain. Continue NTG gtt.   HTN - continue Metoprolol succinate 50 mg po qd.   Obesity BMI- is 37.15 kg/(m^2).   Hyperlipidemia- her cholesterol is well controlled at baseline. TC 155; TG 63; HDL 55; LDL 87  -- Continue statin in the setting of CAD   Hypothyroidism - continue synthroid   Pulmonary nodules by CT, stable, has f/u with PCP  regarding this   Newly diagnosed DM- seeing an endocrinologist. SSI  Sleep apnea- hx consistent with OSA- may need a sleep study as an outpatient   Signed, Perry Mount, PA-C 09/26/2013, 5:57 PM  Pager [phone]   Agree with note by Nell Range PA-C  Pt was just D/Cd from [facility] 5 days ago after NSTEMI. Cath done radially by Dr Sheldon Silvan showed an occluded distal PLA branch (prob IRA), LCX-OM disease with nl LV fxn. Medical Rx recommended. Other problems as outlined. Pt had recurrent CP today, worse then before. Better with morphine. Currently has 2/10 CP on IV NTG. Exam benign. Trop .46 (? Tail end of curve from NSTEMI. I have reviewed cath films and agree that there were no 'culprit lesions' requiring intervention. The LCX -OM lesion was mid vessel and it was small in caliber. Rec admit, cycle enzymes, add oral nitrate. No plan to re cath  Lorretta Harp, M.D., Garret Reddish, [place], [place] [address] [place], [place]  [id number]  [phone] 09/26/2013 6:49 PM

## 2013-09-26 NOTE — ED Notes (Signed)
Phlebotomy at bedside to draw blood this RN was unsuccessful x2.

## 2013-09-27 ENCOUNTER — Encounter (HOSPITAL_COMMUNITY)
Admission: EM | Disposition: A | Discharge status: Home / Self Care | Payer: Self-pay | Source: Home / Self Care | Attending: Cardiovascular Disease

## 2013-09-27 DIAGNOSIS — I2510 Atherosclerotic heart disease of native coronary artery without angina pectoris: Secondary | ICD-10-CM

## 2013-09-27 DIAGNOSIS — E785 Hyperlipidemia, unspecified: Secondary | ICD-10-CM | POA: Diagnosis present

## 2013-09-27 DIAGNOSIS — E119 Type 2 diabetes mellitus without complications: Secondary | ICD-10-CM | POA: Diagnosis present

## 2013-09-27 DIAGNOSIS — Z79899 Other long term (current) drug therapy: Secondary | ICD-10-CM | POA: Diagnosis not present

## 2013-09-27 DIAGNOSIS — R918 Other nonspecific abnormal finding of lung field: Secondary | ICD-10-CM | POA: Diagnosis present

## 2013-09-27 DIAGNOSIS — Z7902 Long term (current) use of antithrombotics/antiplatelets: Secondary | ICD-10-CM | POA: Diagnosis not present

## 2013-09-27 DIAGNOSIS — Z7982 Long term (current) use of aspirin: Secondary | ICD-10-CM | POA: Diagnosis not present

## 2013-09-27 DIAGNOSIS — I214 Non-ST elevation (NSTEMI) myocardial infarction: Secondary | ICD-10-CM | POA: Diagnosis present

## 2013-09-27 DIAGNOSIS — I10 Essential (primary) hypertension: Secondary | ICD-10-CM | POA: Diagnosis present

## 2013-09-27 DIAGNOSIS — Z9861 Coronary angioplasty status: Secondary | ICD-10-CM | POA: Diagnosis not present

## 2013-09-27 DIAGNOSIS — F411 Generalized anxiety disorder: Secondary | ICD-10-CM | POA: Diagnosis present

## 2013-09-27 DIAGNOSIS — G4733 Obstructive sleep apnea (adult) (pediatric): Secondary | ICD-10-CM | POA: Diagnosis present

## 2013-09-27 DIAGNOSIS — Z6837 Body mass index (BMI) 37.0-37.9, adult: Secondary | ICD-10-CM | POA: Diagnosis not present

## 2013-09-27 DIAGNOSIS — E039 Hypothyroidism, unspecified: Secondary | ICD-10-CM | POA: Diagnosis present

## 2013-09-27 DIAGNOSIS — E669 Obesity, unspecified: Secondary | ICD-10-CM | POA: Diagnosis present

## 2013-09-27 HISTORY — PX: LEFT HEART CATHETERIZATION WITH CORONARY ANGIOGRAM: SHX5451

## 2013-09-27 LAB — BASIC METABOLIC PANEL
Anion gap: 10 (ref 5–15)
BUN: 19 mg/dL (ref 6–23)
CALCIUM: 9.1 mg/dL (ref 8.4–10.5)
CO2: 26 mEq/L (ref 19–32)
CREATININE: 0.91 mg/dL (ref 0.50–1.10)
Chloride: 102 mEq/L (ref 96–112)
GFR calc Af Amer: 74 mL/min — ABNORMAL LOW (ref >90)
GFR, EST NON AFRICAN AMERICAN: 64 mL/min — AB (ref >90)
GLUCOSE: 125 mg/dL — AB (ref 70–99)
Potassium: 4.1 mEq/L (ref 3.7–5.3)
Sodium: 138 mEq/L (ref 137–147)

## 2013-09-27 LAB — CBC
HEMATOCRIT: 40.6 % (ref 36.0–46.0)
Hemoglobin: 13.8 g/dL (ref 12.0–15.0)
MCH: 31.2 pg (ref 26.0–34.0)
MCHC: 34 g/dL (ref 30.0–36.0)
MCV: 91.9 fL (ref 78.0–100.0)
Platelets: 478 10*3/uL — ABNORMAL HIGH (ref 150–400)
RBC: 4.42 MIL/uL (ref 3.87–5.11)
RDW: 15.5 % (ref 11.5–15.5)
WBC: 7.6 10*3/uL (ref 4.0–10.5)

## 2013-09-27 LAB — TROPONIN I
TROPONIN I: 5.96 ng/mL — AB (ref <0.30)
TROPONIN I: 10.09 ng/mL — AB (ref <0.30)

## 2013-09-27 LAB — HEMOGLOBIN A1C
HEMOGLOBIN A1C: 6.2 % — AB (ref <5.7)
MEAN PLASMA GLUCOSE: 131 mg/dL — AB (ref <117)

## 2013-09-27 LAB — PROTIME-INR
INR: 1.15 (ref 0.00–1.49)
PROTHROMBIN TIME: 14.7 s (ref 11.6–15.2)

## 2013-09-27 LAB — LIPID PANEL
CHOL/HDL RATIO: 2.6 ratio
Cholesterol: 110 mg/dL (ref 0–200)
HDL: 42 mg/dL (ref >39)
LDL Cholesterol: 57 mg/dL (ref 0–99)
Triglycerides: 54 mg/dL (ref <150)
VLDL: 11 mg/dL (ref 0–40)

## 2013-09-27 LAB — HEPARIN LEVEL (UNFRACTIONATED)
HEPARIN UNFRACTIONATED: 0.13 [IU]/mL — AB (ref 0.30–0.70)
Heparin Unfractionated: 0.31 IU/mL (ref 0.30–0.70)

## 2013-09-27 LAB — GLUCOSE, CAPILLARY
GLUCOSE-CAPILLARY: 82 mg/dL (ref 70–99)
Glucose-Capillary: 114 mg/dL — ABNORMAL HIGH (ref 70–99)

## 2013-09-27 SURGERY — LEFT HEART CATHETERIZATION WITH CORONARY ANGIOGRAM
Anesthesia: LOCAL

## 2013-09-27 MED ORDER — FENTANYL CITRATE 0.05 MG/ML IJ SOLN
INTRAMUSCULAR | Status: AC
  Filled 2013-09-27: qty 2

## 2013-09-27 MED ORDER — SODIUM CHLORIDE 0.9 % IJ SOLN
3.0000 mL | INTRAMUSCULAR | Status: DC | PRN
  Administered 2013-09-28: 3 mL via INTRAVENOUS

## 2013-09-27 MED ORDER — SODIUM CHLORIDE 0.9 % IV SOLN: 250.0000 mL | INTRAVENOUS | Status: DC | PRN

## 2013-09-27 MED ORDER — HYDROMORPHONE HCL PF 1 MG/ML IJ SOLN
INTRAMUSCULAR | Status: AC
  Filled 2013-09-27: qty 1

## 2013-09-27 MED ORDER — SODIUM CHLORIDE 0.9 % IJ SOLN
3.0000 mL | Freq: Two times a day (BID) | INTRAMUSCULAR | Status: DC
  Administered 2013-09-28 – 2013-09-30 (×3): 3 mL via INTRAVENOUS

## 2013-09-27 MED ORDER — MIDAZOLAM HCL 2 MG/2ML IJ SOLN
INTRAMUSCULAR | Status: AC
  Filled 2013-09-27: qty 2

## 2013-09-27 MED ORDER — ISOSORBIDE MONONITRATE ER 30 MG PO TB24
30.0000 mg | ORAL_TABLET | Freq: Every day | ORAL | Status: DC
  Administered 2013-09-27 – 2013-09-30 (×4): 30 mg via ORAL
  Filled 2013-09-27 (×5): qty 1

## 2013-09-27 MED ORDER — SODIUM CHLORIDE 0.9 % IJ SOLN
3.0000 mL | Freq: Two times a day (BID) | INTRAMUSCULAR | Status: DC
  Administered 2013-09-28 – 2013-09-29 (×2): 3 mL via INTRAVENOUS

## 2013-09-27 MED ORDER — ASPIRIN 81 MG PO CHEW: 81.0000 mg | CHEWABLE_TABLET | ORAL | Status: DC

## 2013-09-27 MED ORDER — HEPARIN (PORCINE) IN NACL 100-0.45 UNIT/ML-% IJ SOLN: 1100.0000 [IU]/h | INTRAMUSCULAR | Status: DC

## 2013-09-27 MED ORDER — WHITE PETROLATUM GEL
Status: DC | PRN
  Filled 2013-09-27 (×2): qty 5

## 2013-09-27 MED ORDER — SODIUM CHLORIDE 0.9 % IV SOLN
1.0000 mL/kg/h | INTRAVENOUS | Status: AC
  Administered 2013-09-27: 1 mL/kg/h via INTRAVENOUS

## 2013-09-27 MED ORDER — OXYCODONE-ACETAMINOPHEN 5-325 MG PO TABS
1.0000 | ORAL_TABLET | Freq: Four times a day (QID) | ORAL | Status: DC | PRN
  Administered 2013-09-27 – 2013-09-29 (×3): 1 via ORAL
  Filled 2013-09-27 (×3): qty 1

## 2013-09-27 MED ORDER — HEPARIN (PORCINE) IN NACL 100-0.45 UNIT/ML-% IJ SOLN
1100.0000 [IU]/h | INTRAMUSCULAR | Status: DC
  Filled 2013-09-27: qty 250

## 2013-09-27 MED ORDER — HEPARIN SODIUM (PORCINE) 1000 UNIT/ML IJ SOLN
INTRAMUSCULAR | Status: AC
  Filled 2013-09-27: qty 1

## 2013-09-27 MED ORDER — RANOLAZINE ER 500 MG PO TB12
500.0000 mg | ORAL_TABLET | Freq: Two times a day (BID) | ORAL | Status: DC
  Administered 2013-09-27 – 2013-09-30 (×7): 500 mg via ORAL
  Filled 2013-09-27 (×9): qty 1

## 2013-09-27 MED ORDER — HEPARIN BOLUS VIA INFUSION
4000.0000 [IU] | Freq: Once | INTRAVENOUS | Status: AC
  Administered 2013-09-27: 4000 [IU] via INTRAVENOUS
  Filled 2013-09-27: qty 4000

## 2013-09-27 MED ORDER — LIDOCAINE HCL (PF) 1 % IJ SOLN
INTRAMUSCULAR | Status: AC
  Filled 2013-09-27: qty 30

## 2013-09-27 MED ORDER — HEPARIN BOLUS VIA INFUSION
2000.0000 [IU] | Freq: Once | INTRAVENOUS | Status: DC
  Filled 2013-09-27: qty 2000

## 2013-09-27 MED ORDER — HEPARIN (PORCINE) IN NACL 2-0.9 UNIT/ML-% IJ SOLN
INTRAMUSCULAR | Status: AC
  Filled 2013-09-27: qty 1000

## 2013-09-27 MED ORDER — VERAPAMIL HCL 2.5 MG/ML IV SOLN
INTRAVENOUS | Status: AC
  Filled 2013-09-27: qty 2

## 2013-09-27 MED ORDER — SODIUM CHLORIDE 0.9 % IJ SOLN: 3.0000 mL | INTRAMUSCULAR | Status: DC | PRN

## 2013-09-27 MED ORDER — ASPIRIN EC 81 MG PO TBEC
81.0000 mg | DELAYED_RELEASE_TABLET | Freq: Every day | ORAL | Status: DC
Start: 2013-09-29 — End: 2013-09-28

## 2013-09-27 NOTE — CV Procedure (Signed)
    Cardiac Catheterization Procedure Note  Name: [name] [name] MRN: [id number] DOB: 1945-10-19  Procedure: Left Heart Cath, Selective Coronary Angiography, LV angiography  Indication: NSTEMI. This is a 68 year old woman who was hospitalized one week ago with non-ST elevation infarction. She was found to have moderate diffuse nonobstructive coronary artery disease. Medical therapy was recommended. Interestingly, she had a similar presentation in 2006 when she presented with non-ST elevation infarction and also was noted to have nonobstructive disease. She returned yesterday with chest pain typical of unstable angina. She ruled in again for non-STEMI in her troponin has increased over the last 12 hours, most recently with her troponin level of 10.   Procedural Details: The right wrist was prepped, draped, and anesthetized with 1% lidocaine. Using the modified Seldinger technique, a 5/6 French Slender sheath was introduced into the right radial artery. 3 mg of verapamil was administered through the sheath, weight-based unfractionated heparin was administered intravenously. Standard Judkins catheters were used for selective coronary angiography and left ventriculography. Catheter exchanges were performed over an exchange length guidewire. There were no immediate procedural complications. A TR band was used for radial hemostasis at the completion of the procedure.  The patient was transferred to the post catheterization recovery area for further monitoring.  Procedural Findings: Hemodynamics: AO 130/80 LV 134/15  Coronary angiography: Coronary dominance: right  Left mainstem: The left mainstem is patent without significant obstruction. Divides into the LAD and left circumflex.  Left anterior descending (LAD): The LAD is a large-caliber vessel through its proximal portion. There is a large first diagonal branch. There is no obstructive disease in the proximal LAD her first diagonal. The vessel  trifurcates in the midportion into a medium caliber second diagonal, large septal perforator, and LAD segment it reaches the apex. There is mild diffuse disease noted throughout the mid LAD but there is no significant obstruction.  Left circumflex (LCx): The left circumflex has mild proximal stenosis. There is a first obtuse marginal branch with marked tortuosity and diffuse 80% stenosis leading into a subtotal occlusion with 99% stenosis and TIMI 1 flow beyond the area of occlusion. The AV circumflex extends down and supplies a second OM branch without significant disease.  Right coronary artery (RCA): The RCA is dominant. The large vessels supplying the acute marginal, PDA, and then small PLA branch. The vessel has no significant angiographic disease.  Left ventriculography: There is mild hypokinesis of the mid anterolateral wall. The other LV segments contract normally. The estimated LVEF is 65%.  Estimated Blood Loss: Minimal  Final Conclusions:   1. Non-ST elevation infarction secondary to subtotal occlusion of a medium caliber, highly tortuous obtuse marginal branch of the left circumflex.  2. Nonobstructive LAD stenosis(mild)  3. Widely patent dominant right coronary artery  4. Mild segmental contraction abnormality the left ventricle with preserved overall LV systolic function  Recommendations: The patient has had a plaque rupture in her OM branch since her previous catheterization last week. The vessel was subtotally occluded female with TIMI 1-2 flow beyond the lesion. The patient has been chest pain-free this afternoon. I think the vessel is unfavorable for PCI and I would initially recommend medical therapy. If she has refractory angina, PCI would be reasonable.  Sherren Mocha MD, [facility] 09/27/2013, 4:34 PM

## 2013-09-27 NOTE — Progress Notes (Signed)
Received pt from cath procedure alert and able to tolerated fluids. Report called to [place] on 2900.

## 2013-09-27 NOTE — Progress Notes (Signed)
UR completed 

## 2013-09-27 NOTE — Interval H&P Note (Signed)
History and Physical Interval Note:  09/27/2013 3:45 PM  ANESSIA OAKLAND  has presented today for surgery, with the diagnosis of n-stemi  The various methods of treatment have been discussed with the patient and family. After consideration of risks, benefits and other options for treatment, the patient has consented to  Procedure(s): LEFT HEART CATHETERIZATION WITH CORONARY ANGIOGRAM (N/A) as a surgical intervention .  The patient's history has been reviewed, patient examined, no change in status, stable for surgery.  I have reviewed the patient's chart and labs.  Questions were answered to the patient's satisfaction.    Cath Lab Visit (complete for each Cath Lab visit)  Clinical Evaluation Leading to the Procedure:   ACS: Yes.    Non-ACS:    Anginal Classification: CCS IV  Anti-ischemic medical therapy: Maximal Therapy (2 or more classes of medications)  Non-Invasive Test Results: No non-invasive testing performed  Prior CABG: No previous CABG       Sherren Mocha

## 2013-09-27 NOTE — Progress Notes (Signed)
ANTICOAGULATION CONSULT NOTE - Follow Up Consult  Pharmacy Consult for Heparin  Indication: chest pain/ACS  No Known Allergies  Patient Measurements: Height: 5\' 3"  (160 cm) Weight: 198 lb 13.7 oz (90.2 kg) IBW/kg (Calculated) : 52.4 Heparin Dosing Weight: 74 kg  Vital Signs: Temp: 98.2 F (36.8 C) (08/19 2300) Temp src: Oral (08/19 2300) BP: 145/90 mmHg (08/19 2300) Pulse Rate: 79 (08/19 2300)  Labs:  Recent Labs  09/26/13 1707 09/26/13 2017 09/27/13 0130 09/27/13 0135  HGB 15.1*  --   --  13.8  HCT 44.6  --   --  40.6  PLT 451*  --   --  478*  LABPROT  --  14.9 14.7  --   INR  --  1.17 1.15  --   HEPARINUNFRC  --   --  0.31  --   CREATININE 1.11*  --   --  0.91  TROPONINI 0.42* 2.14* 5.96*  --     Estimated Creatinine Clearance: 63.9 ml/min (by C-G formula based on Cr of 0.91).   Assessment: Therapeutic heparin level, other labs as above.   Goal of Therapy:  Heparin level 0.3-0.7 units/ml Monitor platelets by anticoagulation protocol: Yes   Plan:  -Continue heparin at 900 units/hr -0900 HL -Daily CBC/HL -Monitor for bleeding  Narda Bonds 09/27/2013,2:24 AM

## 2013-09-27 NOTE — H&P (View-Only) (Signed)
Patient Name: [name] [name] Date of Encounter: 09/27/2013   Active Problems:   Chest pain   SUBJECTIVE  No CP today.   CURRENT MEDS . aspirin  324 mg Oral NOW   Or  . aspirin  300 mg Rectal NOW  . aspirin EC  81 mg Oral Daily  . atorvastatin  80 mg Oral q1800  . clopidogrel  75 mg Oral Q breakfast  . heparin  2,000 Units Intravenous Once  . insulin aspart  0-15 Units Subcutaneous TID WC  . levothyroxine  100 mcg Oral QAC breakfast  . metoprolol succinate  50 mg Oral Daily  . sodium chloride  3 mL Intravenous Q12H   OBJECTIVE  Filed Vitals:   09/26/13 2230 09/26/13 2300 09/27/13 0300 09/27/13 0805  BP: 126/80 145/90 118/68 135/84  Pulse: 67 79 64   Temp:  98.2 F (36.8 C) 97.5 F (36.4 C) 97.7 F (36.5 C)  TempSrc:  Oral Oral Oral  Resp: 20 15 12 13   Height:      Weight:   198 lb 13.7 oz (90.2 kg)   SpO2: 95% 90% 95% 97%    Intake/Output Summary (Last 24 hours) at 09/27/13 1001 Last data filed at 09/27/13 0600  Gross per 24 hour  Intake 147.48 ml  Output      0 ml  Net 147.48 ml   Filed Weights   09/26/13 1633 09/26/13 2045 09/27/13 0300  Weight: 209 lb (94.802 kg) 198 lb 13.7 oz (90.2 kg) 198 lb 13.7 oz (90.2 kg)    PHYSICAL EXAM  General: Pleasant, NAD. Neuro: Alert and oriented X 3. Moves all extremities spontaneously. Psych: Normal affect. HEENT:  Normal  Neck: Supple without bruits or JVD. Lungs:  Resp regular and unlabored, CTA. Heart: RRR no s3, s4, or murmurs. Abdomen: Soft, non-tender, non-distended, BS + x 4.  Extremities: No clubbing, cyanosis or edema. DP/PT/Radials 2+ and equal bilaterally.  Accessory Clinical Findings  CBC  Recent Labs  09/26/13 1707 09/27/13 0135  WBC 7.2 7.6  HGB 15.1* 13.8  HCT 44.6 40.6  MCV 93.3 91.9  PLT 451* 161*   Basic Metabolic Panel  Recent Labs  09/26/13 1707 09/26/13 2017 09/27/13 0135  NA 142  --  138  K 4.3  --  4.1  CL 101  --  102  CO2 25  --  26  GLUCOSE 85  --  125*  BUN  21  --  19  CREATININE 1.11*  --  0.91  CALCIUM 9.8  --  9.1  MG  --  2.2  --    Cardiac Enzymes  Recent Labs  09/26/13 2017 09/27/13 0130 09/27/13 0858  TROPONINI 2.14* 5.96* 10.09*   Fasting Lipid Panel  Recent Labs  09/27/13 0135  CHOL 110  HDL 42  LDLCALC 57  TRIG 54  CHOLHDL 2.6    TELE  NSR  Radiology/Studies  Left Heart Catheterization with Coronary Angiography Report   LEFT VENTRICULOGRAM: LVEF of 55%  IMPRESSIONS: 1. Non-ST elevation myocardial infarction related to occlusion of the distal circumflex. The mid to distal obtuse marginal #1 also contains a significant stenosis that is best treated with medical therapy .  2. Widely patent RCA, and LAD. Marked tortuosity is noted in both vascular territories. The left main is widely patent.  3. Overall normal left ventricular function with an estimated ejection fraction of 55%  RECOMMENDATION: Medical therapy to include beta blocker, long-acting nitrate, aspirin and Plavix (30-90 days), and aggressive  blood pressure and lipid control.   ASSESSMENT AND PLAN  [name] [name] is a 68 y.o. female with a history of hypothyroidism, HTN, OSA, anxiety, who was just discharged 5 days ago for NSTEMI  1. CAD/NSTEMI- cath on 8/13 with occlusion of the distal Cx with recommendation for aggressive medical therapy to include beta blocker, long-acting nitrate, aspirin and Plavix (30-90 days), and aggressive blood pressure and lipid control.  However, the patient returns back - initial troponin 0.46 --> 5.9--> 10.09, the films were reviewed with Dr Burt Knack.  We will proceed with cardiac catheterization again.   - continue NTG drip and heparin drip   2. HTN - better controlled on Metoprolol succinate 50 mg po qd.   3 Hyperlipidemia- her cholesterol is well controlled at baseline. TC 155; TG 63; HDL 55; LDL 87 -- Continue statin in the setting of CAD  4. Hypothyroidism - continue synthroid  5. Pulmonary nodules by CT,  stable, has f/u with PCP regarding this     Signed, Ena Dawley H PA-C

## 2013-09-27 NOTE — Progress Notes (Addendum)
ANTICOAGULATION CONSULT NOTE - Follow Up Consult  Pharmacy Consult for Heparin Indication: chest pain/ACS  No Known Allergies  Patient Measurements: Height: 5\' 3"  (160 cm) Weight: 198 lb 13.7 oz (90.2 kg) IBW/kg (Calculated) : 52.4 Heparin Dosing Weight: 74kg  Vital Signs: Temp: 97.7 F (36.5 C) (08/20 0805) Temp src: Oral (08/20 0805) BP: 135/84 mmHg (08/20 0805) Pulse Rate: 64 (08/20 0300)  Labs:  Recent Labs  09/26/13 1707 09/26/13 2017 09/27/13 0130 09/27/13 0135 09/27/13 0858  HGB 15.1*  --   --  13.8  --   HCT 44.6  --   --  40.6  --   PLT 451*  --   --  478*  --   LABPROT  --  14.9 14.7  --   --   INR  --  1.17 1.15  --   --   HEPARINUNFRC  --   --  0.31  --  0.13*  CREATININE 1.11*  --   --  0.91  --   TROPONINI 0.42* 2.14* 5.96*  --   --     Estimated Creatinine Clearance: 63.9 ml/min (by C-G formula based on Cr of 0.91).   Medications:  Heparin @ 900 units/hr  Assessment: [name] [name] recently discharged 8/15 after admission for NSTEMI (LHC showed occluded distal PLA branch (prob IRA), LCX-OM disease, no intervention done) continues on heparin for new vs residual NSTEMI. Confirmatory heparin level is below goal. CBC stable. No bleeding reported. No plans for re-cath.  Goal of Therapy:  Heparin level 0.3-0.7 units/ml Monitor platelets by anticoagulation protocol: Yes   Plan:  1) Re-bolus heparin 2000 units x 1 2) Increase heparin to 1100 units/hr 3) Check 6 hour heparin level  Deboraha Sprang 09/27/2013,9:50 AM  Addendum: Heparin was d/c'ed by Dr. Meda Coffee, but troponin came back at 10.09 which is significantly increased. Received orders to restart heparin with plans to go to cath this afternoon. Per RN, heparin has been off for about an hour. Prior to d/cing the heparin the rate was not increased.   Plan: 1) Heparin bolus 4000 units x 1 2) Heparin drip at 1100 units/hr 3) Follow up after cath  Deboraha Sprang 09/27/2013, 12:20 PM

## 2013-09-27 NOTE — Progress Notes (Addendum)
Patient Name: [name] [name] Date of Encounter: 09/27/2013   Active Problems:   Chest pain   SUBJECTIVE  No CP today.   CURRENT MEDS . aspirin  324 mg Oral NOW   Or  . aspirin  300 mg Rectal NOW  . aspirin EC  81 mg Oral Daily  . atorvastatin  80 mg Oral q1800  . clopidogrel  75 mg Oral Q breakfast  . heparin  2,000 Units Intravenous Once  . insulin aspart  0-15 Units Subcutaneous TID WC  . levothyroxine  100 mcg Oral QAC breakfast  . metoprolol succinate  50 mg Oral Daily  . sodium chloride  3 mL Intravenous Q12H   OBJECTIVE  Filed Vitals:   09/26/13 2230 09/26/13 2300 09/27/13 0300 09/27/13 0805  BP: 126/80 145/90 118/68 135/84  Pulse: 67 79 64   Temp:  98.2 F (36.8 C) 97.5 F (36.4 C) 97.7 F (36.5 C)  TempSrc:  Oral Oral Oral  Resp: 20 15 12 13   Height:      Weight:   198 lb 13.7 oz (90.2 kg)   SpO2: 95% 90% 95% 97%    Intake/Output Summary (Last 24 hours) at 09/27/13 1001 Last data filed at 09/27/13 0600  Gross per 24 hour  Intake 147.48 ml  Output      0 ml  Net 147.48 ml   Filed Weights   09/26/13 1633 09/26/13 2045 09/27/13 0300  Weight: 209 lb (94.802 kg) 198 lb 13.7 oz (90.2 kg) 198 lb 13.7 oz (90.2 kg)    PHYSICAL EXAM  General: Pleasant, NAD. Neuro: Alert and oriented X 3. Moves all extremities spontaneously. Psych: Normal affect. HEENT:  Normal  Neck: Supple without bruits or JVD. Lungs:  Resp regular and unlabored, CTA. Heart: RRR no s3, s4, or murmurs. Abdomen: Soft, non-tender, non-distended, BS + x 4.  Extremities: No clubbing, cyanosis or edema. DP/PT/Radials 2+ and equal bilaterally.  Accessory Clinical Findings  CBC  Recent Labs  09/26/13 1707 09/27/13 0135  WBC 7.2 7.6  HGB 15.1* 13.8  HCT 44.6 40.6  MCV 93.3 91.9  PLT 451* 270*   Basic Metabolic Panel  Recent Labs  09/26/13 1707 09/26/13 2017 09/27/13 0135  NA 142  --  138  K 4.3  --  4.1  CL 101  --  102  CO2 25  --  26  GLUCOSE 85  --  125*  BUN  21  --  19  CREATININE 1.11*  --  0.91  CALCIUM 9.8  --  9.1  MG  --  2.2  --    Cardiac Enzymes  Recent Labs  09/26/13 2017 09/27/13 0130 09/27/13 0858  TROPONINI 2.14* 5.96* 10.09*   Fasting Lipid Panel  Recent Labs  09/27/13 0135  CHOL 110  HDL 42  LDLCALC 57  TRIG 54  CHOLHDL 2.6    TELE  NSR  Radiology/Studies  Left Heart Catheterization with Coronary Angiography Report   LEFT VENTRICULOGRAM: LVEF of 55%  IMPRESSIONS: 1. Non-ST elevation myocardial infarction related to occlusion of the distal circumflex. The mid to distal obtuse marginal #1 also contains a significant stenosis that is best treated with medical therapy .  2. Widely patent RCA, and LAD. Marked tortuosity is noted in both vascular territories. The left main is widely patent.  3. Overall normal left ventricular function with an estimated ejection fraction of 55%  RECOMMENDATION: Medical therapy to include beta blocker, long-acting nitrate, aspirin and Plavix (30-90 days), and aggressive  blood pressure and lipid control.   ASSESSMENT AND PLAN  DESERE GWIN is a 68 y.o. female with a history of hypothyroidism, HTN, OSA, anxiety, who was just discharged 5 days ago for NSTEMI  1. CAD/NSTEMI- cath on 8/13 with occlusion of the distal Cx with recommendation for aggressive medical therapy to include beta blocker, long-acting nitrate, aspirin and Plavix (30-90 days), and aggressive blood pressure and lipid control.  However, the patient returns back - initial troponin 0.46 --> 5.9--> 10.09, the films were reviewed with Dr Burt Knack.  We will proceed with cardiac catheterization again.   - continue NTG drip and heparin drip   2. HTN - better controlled on Metoprolol succinate 50 mg po qd.   3 Hyperlipidemia- her cholesterol is well controlled at baseline. TC 155; TG 63; HDL 55; LDL 87 -- Continue statin in the setting of CAD  4. Hypothyroidism - continue synthroid  5. Pulmonary nodules by CT,  stable, has f/u with PCP regarding this     Signed, Ena Dawley H PA-C

## 2013-09-28 ENCOUNTER — Ambulatory Visit: Payer: Medicare Other | Admitting: Cardiology

## 2013-09-28 ENCOUNTER — Encounter (HOSPITAL_COMMUNITY)
Admission: EM | Disposition: A | Discharge status: Home / Self Care | Payer: Medicare HMO | Source: Home / Self Care | Attending: Cardiovascular Disease

## 2013-09-28 DIAGNOSIS — E039 Hypothyroidism, unspecified: Secondary | ICD-10-CM

## 2013-09-28 DIAGNOSIS — I2510 Atherosclerotic heart disease of native coronary artery without angina pectoris: Secondary | ICD-10-CM

## 2013-09-28 LAB — GLUCOSE, CAPILLARY
Glucose-Capillary: 78 mg/dL (ref 70–99)
Glucose-Capillary: 90 mg/dL (ref 70–99)
Glucose-Capillary: 86 mg/dL (ref 70–99)

## 2013-09-28 LAB — TROPONIN I: Troponin I: 3.99 ng/mL (ref <0.30)

## 2013-09-28 SURGERY — LEFT HEART CATHETERIZATION WITH CORONARY ANGIOGRAM
Anesthesia: LOCAL

## 2013-09-28 MED ORDER — ASPIRIN EC 81 MG PO TBEC
81.0000 mg | DELAYED_RELEASE_TABLET | Freq: Every day | ORAL | Status: DC
  Administered 2013-09-28 – 2013-09-30 (×3): 81 mg via ORAL
  Filled 2013-09-28 (×4): qty 1

## 2013-09-28 NOTE — Progress Notes (Addendum)
CARDIAC REHAB PHASE I   PRE:  Rate/Rhythm: 65 SR    BP: sitting 155/90    SaO2:   MODE:  Ambulation: 740 ft   POST:  Rate/Rhythm: 95 SR    BP: sitting 159/82     SaO2:   Tolerated well. Denied CP. Reviewed ed and gave more materials. Has orientation appt with [facility] 8/29. Encouraged more walking and to notify RN if CP. Pt does not need PT. [phone]   Darrick Meigs CES, ACSM 09/28/2013 11:54 AM

## 2013-09-28 NOTE — Progress Notes (Signed)
Patient Name: [name] [name] Date of Encounter: 09/28/2013   Active Problems:   Chest pain   SUBJECTIVE  No CP today.   CURRENT MEDS . aspirin EC  81 mg Oral Daily  . atorvastatin  80 mg Oral q1800  . clopidogrel  75 mg Oral Q breakfast  . isosorbide mononitrate  30 mg Oral Daily  . levothyroxine  100 mcg Oral QAC breakfast  . metoprolol succinate  50 mg Oral Daily  . ranolazine  500 mg Oral BID  . sodium chloride  3 mL Intravenous Q12H  . sodium chloride  3 mL Intravenous Q12H  . sodium chloride  3 mL Intravenous Q12H   OBJECTIVE  Filed Vitals:   09/27/13 1943 09/27/13 2317 09/28/13 0304 09/28/13 0805  BP: 130/62 155/95 125/61 135/92  Pulse: 61 67 74 58  Temp: 97.9 F (36.6 C) 98.5 F (36.9 C) 97.8 F (36.6 C) 97.9 F (36.6 C)  TempSrc: Oral Oral Oral Oral  Resp: 12 20 14 11   Height:      Weight:   200 lb 13.4 oz (91.1 kg)   SpO2: 98% 98% 98% 100%    Intake/Output Summary (Last 24 hours) at 09/28/13 1053 Last data filed at 09/27/13 1957  Gross per 24 hour  Intake 269.85 ml  Output      0 ml  Net 269.85 ml   Filed Weights   09/27/13 0300 09/27/13 1313 09/28/13 0304  Weight: 198 lb 13.7 oz (90.2 kg) 198 lb 13.7 oz (90.2 kg) 200 lb 13.4 oz (91.1 kg)   PHYSICAL EXAM  General: Pleasant, NAD. Neuro: Alert and oriented X 3. Moves all extremities spontaneously. Psych: Normal affect. HEENT:  Normal  Neck: Supple without bruits or JVD. Lungs:  Resp regular and unlabored, CTA. Heart: RRR no s3, s4, or murmurs. Abdomen: Soft, non-tender, non-distended, BS + x 4.  Extremities: No clubbing, cyanosis or edema. DP/PT/Radials 2+ and equal bilaterally.  Accessory Clinical Findings  CBC  Recent Labs  09/26/13 1707 09/27/13 0135  WBC 7.2 7.6  HGB 15.1* 13.8  HCT 44.6 40.6  MCV 93.3 91.9  PLT 451* 952*   Basic Metabolic Panel  Recent Labs  09/26/13 1707 09/26/13 2017 09/27/13 0135  NA 142  --  138  K 4.3  --  4.1  CL 101  --  102  CO2 25  --   26  GLUCOSE 85  --  125*  BUN 21  --  19  CREATININE 1.11*  --  0.91  CALCIUM 9.8  --  9.1  MG  --  2.2  --    Cardiac Enzymes  Recent Labs  09/26/13 2017 09/27/13 0130 09/27/13 0858  TROPONINI 2.14* 5.96* 10.09*   Fasting Lipid Panel  Recent Labs  09/27/13 0135  CHOL 110  HDL 42  LDLCALC 57  TRIG 54  CHOLHDL 2.6   TELE: NSR  Radiology/Studies  Left Heart Catheterization with Coronary Angiography Report   Final Conclusions:  1. Non-ST elevation infarction secondary to subtotal occlusion of a medium caliber, highly tortuous obtuse marginal branch of the left circumflex.  2. Nonobstructive LAD stenosis(mild)  3. Widely patent dominant right coronary artery  4. Mild segmental contraction abnormality the left ventricle with preserved overall LV systolic function  Recommendations: The patient has had a plaque rupture in her OM branch since her previous catheterization last week. The vessel was subtotally occluded female with TIMI 1-2 flow beyond the lesion. The patient has been chest pain-free this  afternoon. I think the vessel is unfavorable for PCI and I would initially recommend medical therapy. If she has refractory angina, PCI would be reasonable.   ASSESSMENT AND PLAN  [name] [name] is a 68 y.o. female with a history of hypothyroidism, HTN, OSA, anxiety, who was just discharged 5 days ago for NSTEMI  1. CAD/NSTEMI- cath on 8/13 with occlusion of the distal Cx with recommendation for aggressive medical therapy, she came back on 09/26/2013  With another NSTEMI Troponin 10 again, cath on 8/20 showed plaque rupture in her OM branch since her previous catheterization last week. The vessel was subtotally occluded female with TIMI 1-2 flow beyond the lesion. The patient has been chest pain-free at the time of cath. Per Dr Burt Knack the vessel was unfavorable for PCI and recommended initial medical therapy. If she has refractory angina, PCI would be reasonable.  She is chest pain  free this morning, we will continue aspirin, plavix, metoprolol, imdur. We added ranolazine for chest pain control.  The goal today will be to start mobilizing, we will start PT and if she can tolerate it we will plan on discharge tomorrow.  Cardiac rehab scheduled for 10/06/13.  2. HTN - controlled  3 Hyperlipidemia- her cholesterol is well controlled at baseline. TC 155; TG 63; HDL 55; LDL 87 - Continue high dose statin  4. Hypothyroidism - continue synthroid  5. Pulmonary nodules by CT, stable, has f/u with PCP regarding this     Signed, Ena Dawley H PA-C

## 2013-09-29 LAB — BASIC METABOLIC PANEL
Anion gap: 12 (ref 5–15)
BUN: 15 mg/dL (ref 6–23)
CALCIUM: 9.6 mg/dL (ref 8.4–10.5)
CO2: 25 meq/L (ref 19–32)
CREATININE: 1.05 mg/dL (ref 0.50–1.10)
Chloride: 105 mEq/L (ref 96–112)
GFR calc Af Amer: 62 mL/min — ABNORMAL LOW (ref >90)
GFR calc non Af Amer: 54 mL/min — ABNORMAL LOW (ref >90)
GLUCOSE: 101 mg/dL — AB (ref 70–99)
Potassium: 4.5 mEq/L (ref 3.7–5.3)
SODIUM: 142 meq/L (ref 137–147)

## 2013-09-29 LAB — GLUCOSE, CAPILLARY
Glucose-Capillary: 94 mg/dL (ref 70–99)
Glucose-Capillary: 92 mg/dL (ref 70–99)

## 2013-09-29 NOTE — Progress Notes (Signed)
Patient Name: [name] [name] Date of Encounter: 09/29/2013   Active Problems:   Chest pain   SUBJECTIVE  Feels better. Still with "twinges of CP". Walked with CR without too much problem. Apprehensive of going home as she had NSTEMI on going home last time.   CURRENT MEDS . aspirin EC  81 mg Oral Daily  . atorvastatin  80 mg Oral q1800  . clopidogrel  75 mg Oral Q breakfast  . isosorbide mononitrate  30 mg Oral Daily  . levothyroxine  100 mcg Oral QAC breakfast  . metoprolol succinate  50 mg Oral Daily  . ranolazine  500 mg Oral BID  . sodium chloride  3 mL Intravenous Q12H  . sodium chloride  3 mL Intravenous Q12H  . sodium chloride  3 mL Intravenous Q12H   OBJECTIVE  Filed Vitals:   09/28/13 1940 09/28/13 2302 09/29/13 0316 09/29/13 0721  BP: 127/85 146/75 134/81 174/89  Pulse: 58 60 63 63  Temp: 98.3 F (36.8 C) 97.5 F (36.4 C) 97.8 F (36.6 C) 98 F (36.7 C)  TempSrc: Oral Oral Oral Oral  Resp: 17 17 16 18   Height:      Weight:   91.5 kg (201 lb 11.5 oz)   SpO2: 93% 95% 94% 96%    Intake/Output Summary (Last 24 hours) at 09/29/13 0959 Last data filed at 09/29/13 0854  Gross per 24 hour  Intake    240 ml  Output    500 ml  Net   -260 ml   Filed Weights   09/27/13 1313 09/28/13 0304 09/29/13 0316  Weight: 90.2 kg (198 lb 13.7 oz) 91.1 kg (200 lb 13.4 oz) 91.5 kg (201 lb 11.5 oz)   PHYSICAL EXAM  General: Pleasant, NAD. Sitting in chair.  Neuro: Alert and oriented X 3. Moves all extremities spontaneously. Psych: Normal affect. HEENT:  Normal  Neck: Supple without bruits or JVD. Lungs:  Resp regular and unlabored, CTA. Heart: RRR no s3, s4, or murmurs. Abdomen: Soft, non-tender, non-distended, BS + x 4.  Extremities: No clubbing, cyanosis or edema.   Accessory Clinical Findings  CBC  Recent Labs  09/26/13 1707 09/27/13 0135  WBC 7.2 7.6  HGB 15.1* 13.8  HCT 44.6 40.6  MCV 93.3 91.9  PLT 451* 976*   Basic Metabolic Panel  Recent  Labs  09/26/13 1707 09/26/13 2017 09/27/13 0135 09/29/13 0324  NA 142  --  138 142  K 4.3  --  4.1 4.5  CL 101  --  102 105  CO2 25  --  26 25  GLUCOSE 85  --  125* 101*  BUN 21  --  19 15  CREATININE 1.11*  --  0.91 1.05  CALCIUM 9.8  --  9.1 9.6  MG  --  2.2  --   --    Cardiac Enzymes  Recent Labs  09/27/13 0130 09/27/13 0858 09/28/13 1215  TROPONINI 5.96* 10.09* 3.99*   Fasting Lipid Panel  Recent Labs  09/27/13 0135  CHOL 110  HDL 42  LDLCALC 57  TRIG 54  CHOLHDL 2.6   TELE: NSR  Radiology/Studies  Left Heart Catheterization with Coronary Angiography Report   Final Conclusions:  1. Non-ST elevation infarction secondary to subtotal occlusion of a medium caliber, highly tortuous obtuse marginal branch of the left circumflex.  2. Nonobstructive LAD stenosis(mild)  3. Widely patent dominant right coronary artery  4. Mild segmental contraction abnormality the left ventricle with preserved overall LV systolic  function  Recommendations: The patient has had a plaque rupture in her OM branch since her previous catheterization last week. The vessel was subtotally occluded female with TIMI 1-2 flow beyond the lesion. The patient has been chest pain-free this afternoon. I think the vessel is unfavorable for PCI and I would initially recommend medical therapy. If she has refractory angina, PCI would be reasonable.   ASSESSMENT AND PLAN  [name] [name] is a 68 y.o. female with a history of hypothyroidism, HTN, OSA, anxiety, who was just discharged 5 days ago for NSTEMI  1. CAD/NSTEMI- cath on 8/13 with occlusion of the distal Cx with recommendation for aggressive medical therapy, she came back on 09/26/2013  With another NSTEMI Troponin 10 again, cath on 8/20 showed plaque rupture in her OM branch since her previous catheterization last week. The vessel was subtotally occluded female with TIMI 1-2 flow beyond the lesion. The patient has been chest pain-free at the time of  cath. Per Dr Burt Knack the vessel was unfavorable for PCI and recommended initial medical therapy. If she has refractory angina, PCI would be reasonable.  She is improved but still with occasional chest twinges. Apprehensive about going home. I will transfer her to tele and let her ambulate more freely if continues to feel well can go home tomorrow.  Continue aspirin, plavix, metoprolol, imdur. Ranolazine was added for chest pain control.   Cardiac rehab scheduled for 10/06/13 at [facility].  2. HTN - controlled  3 Hyperlipidemia- her cholesterol is well controlled at baseline.  - Continue high dose statin  4. Hypothyroidism - continue synthroid  5. Pulmonary nodules by CT, stable, has f/u with PCP regarding this     Signed, Glori Bickers MD

## 2013-09-29 NOTE — Progress Notes (Signed)
PT Cancellation Note  Patient Details Name: [name] [name] MRN: [id number] DOB: 1945/03/27   Cancelled Treatment:    Reason Eval/Treat Not Completed: PT screened, no needs identified, will sign off.   Per Cardiac Rehab note, pt ambulating 1440' independently - no AD.   If needs change, please reconsult.    Jolyn Lent 09/29/2013, 8:57 AM  Jolyn Lent, PT, DPT Acute Rehabilitation Services Pager: [phone]

## 2013-09-29 NOTE — Progress Notes (Signed)
CARDIAC REHAB PHASE I   PRE:  Rate/Rhythm: 65 SR  BP:  Supine:   Sitting: 176/86  Standing:    SaO2: 94%RA  MODE:  Ambulation: 1440 ft   POST:  Rate/Rhythm: 129 few seconds then 96 SR  BP:  Supine:   Sitting: 167/90  Standing:    SaO2: 97%RA [id number] Pt walked 1440 ft with steady pace independently. Tolerated well. No CP. BP elevated this morning and heart rate to 129 for few seconds then to 96 SR and 70s with rest.   Graylon Good, RN BSN  09/29/2013 8:45 AM

## 2013-09-29 NOTE — Progress Notes (Signed)
Called to give report 3W nurse not available to take report will call back

## 2013-09-30 LAB — BASIC METABOLIC PANEL
Anion gap: 14 (ref 5–15)
BUN: 17 mg/dL (ref 6–23)
CHLORIDE: 103 meq/L (ref 96–112)
CO2: 24 meq/L (ref 19–32)
CREATININE: 0.95 mg/dL (ref 0.50–1.10)
Calcium: 9.3 mg/dL (ref 8.4–10.5)
GFR calc Af Amer: 70 mL/min — ABNORMAL LOW (ref >90)
GFR calc non Af Amer: 61 mL/min — ABNORMAL LOW (ref >90)
GLUCOSE: 94 mg/dL (ref 70–99)
POTASSIUM: 4.1 meq/L (ref 3.7–5.3)
Sodium: 141 mEq/L (ref 137–147)

## 2013-09-30 LAB — GLUCOSE, CAPILLARY
GLUCOSE-CAPILLARY: 88 mg/dL (ref 70–99)
GLUCOSE-CAPILLARY: 87 mg/dL (ref 70–99)

## 2013-09-30 MED ORDER — RANOLAZINE ER 500 MG PO TB12: 500.0000 mg | ORAL_TABLET | Freq: Two times a day (BID) | ORAL | Status: DC

## 2013-09-30 NOTE — Progress Notes (Signed)
Nsg Discharge Note  Admit Date:  09/26/2013 Discharge date: 09/30/2013   [name] [name] to be D/C'd Home per MD order.  AVS completed.  Copy for chart, and copy for patient signed, and dated. Patient/caregiver able to verbalize understanding.  Discharge Medication:   Medication List         aspirin 81 MG tablet  Take 81 mg by mouth daily.     atorvastatin 80 MG tablet  Commonly known as:  LIPITOR  Take 80 mg by mouth daily.     clopidogrel 75 MG tablet  Commonly known as:  PLAVIX  Take 75 mg by mouth daily.     isosorbide dinitrate 30 MG tablet  Commonly known as:  ISORDIL  Take 30 mg by mouth 2 (two) times daily.     levothyroxine 100 MCG tablet  Commonly known as:  SYNTHROID, LEVOTHROID  Take 100 mcg by mouth daily.     metoprolol succinate 50 MG 24 hr tablet  Commonly known as:  TOPROL-XL  Take 50 mg by mouth daily. Take with or immediately following a meal.     multivitamin with minerals Tabs tablet  Take 1 tablet by mouth daily.     nitroGLYCERIN 0.4 MG SL tablet  Commonly known as:  NITROSTAT  Place 0.4 mg under the tongue every 5 (five) minutes x 3 doses as needed for chest pain.     ranolazine 500 MG 12 hr tablet  Commonly known as:  RANEXA  Take 1 tablet (500 mg total) by mouth 2 (two) times daily.     Turmeric Curcumin Caps  Take 1 capsule by mouth daily as needed. Headache     vitamin C 1000 MG tablet  Take 1,000 mg by mouth daily.        Discharge Assessment: Filed Vitals:   09/30/13 0906  BP: 138/88  Pulse:   Temp:   Resp:    Skin clean, dry and intact without evidence of skin break down, no evidence of skin tears noted. IV catheter discontinued intact. Site without signs and symptoms of complications - no redness or edema noted at insertion site, patient denies c/o pain - only slight tenderness at site.  Dressing with slight pressure applied.  D/c Instructions-Education: Discharge instructions given to patient/family with verbalized  understanding. D/c education completed with patient/family including follow up instructions, medication list, d/c activities limitations if indicated, with other d/c instructions as indicated by MD - patient able to verbalize understanding, all questions fully answered. Patient instructed to return to ED, call 911, or call MD for any changes in condition.  Patient escorted via [place], and D/C home via private auto.  [name], RN 09/30/2013 1:07 PM

## 2013-09-30 NOTE — Discharge Summary (Signed)
Physician Discharge Summary     Cardiologist:  Branch  Patient ID: [name] [name] MRN: [id number] DOB/AGE: [birth year] 68 y.o.  Admit date: 09/26/2013 Discharge date: 09/30/2013  Admission Diagnoses:  Chest Pain  Discharge Diagnoses:  Active Problems:   Chest pain   CAD/NSTEMI    HTN   Hyperlipidemia   Hypothyroidism   Pulmonary nodules    Discharged Condition: stable  Hospital Course:   [name] [name] is a 68 y.o. female with a history of diabetes, hypothyroidism, HTN, OSA, anxiety, CAD s/p recent NSTEMI (discharge 4 days ago on 09/22/13) who presented to [facility] today with chest pain and found to have recurrent or residual NSTEMI ( troponin 0.42).   She has been to the emergency room on 4 separate occasions including this admission. On the first two occasions cardiac markers were negative and she was discharged from the hospital. Last admission she came in complaining of several hours of chest pressure and her second cardiac marker was significantly elevated at [facility] with a troponin greater than 3 and she was transferred to [facility]. Her troponin peaked >10 and was 8.22 on 09/21/13. She underwent LHC which revealed her NSTEMI was related to occlusion of the distal Cx. The mid to distal obtuse marginal #1 also contained a significant stenosis that was though best treated with medical therapy. Patent RCA/LAD with tortuosity. LM patent. EF 55%. She was placed on a beta blocker, long-acting nitrate, aspirin and Plavix (30-90 days), and aggressive blood pressure and lipid control was advised. She was originally placed on Imdur 60 mg but she had a significant headache so it was decreased to 30mg  qd. She underwent 2D ECHO: 09/21/2013; EF 55-60%; mild concentric hypertrophy, G1DD, mild LA dilation, PA pressure 37. Small RV pericardial effusion   She was feeling okay since discharge and having no chest pain. She was at her endocrinologist office today for control of newly diagnosed diabetes. She had  an episode of chest pain after her appointment. This was brought down to mild chest pain after SL NTG and NTG gtt.  This was worse than her previous episodes.   The patient was admitted with another NSTEMI.  Troponin peaked at 10.09.  She was started on IV NTG and heparin.  Beta blocker, ASA, plavix, statin continued.  She went for another left heart cath that revealed the NSTEMI was secondary to subtotal occlusion of a medium caliber, highly tortuous obtuse marginal branch of the left circumflex.  PCI is not favorable unless she has refractory angina.  We will continue aspirin, plavix, statin, metoprolol, imdur, ranexa.  She was seen by cardiac rehab and ambulated without CP.  Pulmonary nodules found on CT will need f/u with PCP.   The patient was seen by Dr. Johnsie Cancel who felt she was stable for DC home.      Consults: Cardiac Rehab  Significant Diagnostic Studies:   Cardiac Catheterization Procedure Note  Name: [name] [name]  MRN: [id number]  DOB: [birth year]  Procedure: Left Heart Cath, Selective Coronary Angiography, LV angiography  Indication: NSTEMI. This is a 68 year old woman who was hospitalized one week ago with non-ST elevation infarction. She was found to have moderate diffuse nonobstructive coronary artery disease. Medical therapy was recommended. Interestingly, she had a similar presentation in 2006 when she presented with non-ST elevation infarction and also was noted to have nonobstructive disease. She returned yesterday with chest pain typical of unstable angina. She ruled in again for non-STEMI in her troponin has increased over the last 12  hours, most recently with her troponin level of 10.  Procedural Details: The right wrist was prepped, draped, and anesthetized with 1% lidocaine. Using the modified Seldinger technique, a 5/6 French Slender sheath was introduced into the right radial artery. 3 mg of verapamil was administered through the sheath, weight-based unfractionated heparin  was administered intravenously. Standard Judkins catheters were used for selective coronary angiography and left ventriculography. Catheter exchanges were performed over an exchange length guidewire. There were no immediate procedural complications. A TR band was used for radial hemostasis at the completion of the procedure. The patient was transferred to the post catheterization recovery area for further monitoring.  Procedural Findings:  Hemodynamics:  AO 130/80  LV 134/15  Coronary angiography:  Coronary dominance: right  Left mainstem: The left mainstem is patent without significant obstruction. Divides into the LAD and left circumflex.  Left anterior descending (LAD): The LAD is a large-caliber vessel through its proximal portion. There is a large first diagonal branch. There is no obstructive disease in the proximal LAD her first diagonal. The vessel trifurcates in the midportion into a medium caliber second diagonal, large septal perforator, and LAD segment it reaches the apex. There is mild diffuse disease noted throughout the mid LAD but there is no significant obstruction.  Left circumflex (LCx): The left circumflex has mild proximal stenosis. There is a first obtuse marginal branch with marked tortuosity and diffuse 80% stenosis leading into a subtotal occlusion with 99% stenosis and TIMI 1 flow beyond the area of occlusion. The AV circumflex extends down and supplies a second OM branch without significant disease.  Right coronary artery (RCA): The RCA is dominant. The large vessels supplying the acute marginal, PDA, and then small PLA branch. The vessel has no significant angiographic disease.  Left ventriculography: There is mild hypokinesis of the mid anterolateral wall. The other LV segments contract normally. The estimated LVEF is 65%.  Estimated Blood Loss: Minimal   Final Conclusions:  1. Non-ST elevation infarction secondary to subtotal occlusion of a medium caliber, highly tortuous  obtuse marginal branch of the left circumflex.  2. Nonobstructive LAD stenosis(mild)  3. Widely patent dominant right coronary artery  4. Mild segmental contraction abnormality the left ventricle with preserved overall LV systolic function  Recommendations: The patient has had a plaque rupture in her OM branch since her previous catheterization last week. The vessel was subtotally occluded female with TIMI 1-2 flow beyond the lesion. The patient has been chest pain-free this afternoon. I think the vessel is unfavorable for PCI and I would initially recommend medical therapy. If she has refractory angina, PCI would be reasonable.  Sherren Mocha MD, [facility]  09/27/2013, 4:34 PM  Treatments: See above  Discharge Exam: Blood pressure 138/88, pulse 58, temperature 97.9 F (36.6 C), temperature source Oral, resp. rate 18, height 5\' 3"  (1.6 m), weight 207 lb 7.3 oz (94.1 kg), SpO2 96.00%.   Disposition: 01-Home or Self Care      Discharge Instructions   Diet - low sodium heart healthy    Complete by:  As directed      Increase activity slowly    Complete by:  As directed             Medication List         aspirin 81 MG tablet  Take 81 mg by mouth daily.     atorvastatin 80 MG tablet  Commonly known as:  LIPITOR  Take 80 mg by mouth daily.  clopidogrel 75 MG tablet  Commonly known as:  PLAVIX  Take 75 mg by mouth daily.     isosorbide dinitrate 30 MG tablet  Commonly known as:  ISORDIL  Take 30 mg by mouth 2 (two) times daily.     levothyroxine 100 MCG tablet  Commonly known as:  SYNTHROID, LEVOTHROID  Take 100 mcg by mouth daily.     metoprolol succinate 50 MG 24 hr tablet  Commonly known as:  TOPROL-XL  Take 50 mg by mouth daily. Take with or immediately following a meal.     multivitamin with minerals Tabs tablet  Take 1 tablet by mouth daily.     nitroGLYCERIN 0.4 MG SL tablet  Commonly known as:  NITROSTAT  Place 0.4 mg under the tongue every 5 (five) minutes x  3 doses as needed for chest pain.     ranolazine 500 MG 12 hr tablet  Commonly known as:  RANEXA  Take 1 tablet (500 mg total) by mouth 2 (two) times daily.     Turmeric Curcumin Caps  Take 1 capsule by mouth daily as needed. Headache     vitamin C 1000 MG tablet  Take 1,000 mg by mouth daily.       Follow-up Information   Follow up with Carlyle Dolly, F, MD. (We will reschedule your appt for a later date. )    Specialty:  Cardiology   Contact information:   [place] [id number] [phone]      Greater than 30 minutes was spent completing the patient's discharge.    SignedTarri Fuller, [place] 09/30/2013, 9:21 AM

## 2013-09-30 NOTE — Progress Notes (Signed)
Subjective: She reports and twinge of CP lasting a second perhaps.   Objective: Vital signs in last 24 hours: Temp:  [97.5 F (36.4 C)-98 F (36.7 C)] 97.9 F (36.6 C) (08/23 0553) Pulse Rate:  [55-63] 58 (08/23 0553) Resp:  [17-18] 18 (08/23 0553) BP: (132-174)/(64-92) 141/75 mmHg (08/23 0553) SpO2:  [95 %-99 %] 96 % (08/23 0553) Weight:  [207 lb 7.3 oz (94.1 kg)-209 lb (94.802 kg)] 207 lb 7.3 oz (94.1 kg) (08/23 0553) Last BM Date: 09/29/13  Intake/Output from previous day: 08/22 0701 - 08/23 0700 In: 363 [P.O.:360; I.V.:3] Out: -  Intake/Output this shift:    Medications Current Facility-Administered Medications  Medication Dose Route Frequency Provider Last Rate Last Dose  . 0.9 %  sodium chloride infusion  250 mL Intravenous PRN Perry Mount, PA-C      . 0.9 %  sodium chloride infusion  250 mL Intravenous PRN Sherren Mocha, MD      . acetaminophen (TYLENOL) tablet 650 mg  650 mg Oral Q4H PRN Perry Mount, PA-C   650 mg at 09/27/13 0128  . aspirin EC tablet 81 mg  81 mg Oral Daily Lorretta Harp, MD   81 mg at 09/29/13 4098  . atorvastatin (LIPITOR) tablet 80 mg  80 mg Oral q1800 Perry Mount, PA-C   80 mg at 09/29/13 0951  . clopidogrel (PLAVIX) tablet 75 mg  75 mg Oral Q breakfast Perry Mount, PA-C   75 mg at 09/29/13 0734  . isosorbide mononitrate (IMDUR) 24 hr tablet 30 mg  30 mg Oral Daily Dorothy Spark, MD   30 mg at 09/29/13 0951  . levothyroxine (SYNTHROID, LEVOTHROID) tablet 100 mcg  100 mcg Oral QAC breakfast Perry Mount, PA-C   100 mcg at 09/29/13 0734  . metoprolol succinate (TOPROL-XL) 24 hr tablet 50 mg  50 mg Oral Daily Perry Mount, PA-C   50 mg at 09/29/13 0951  . nitroGLYCERIN (NITROSTAT) SL tablet 0.4 mg  0.4 mg Sublingual Q5 Min x 3 PRN Perry Mount, PA-C      . ondansetron [facility]) injection 4 mg  4 mg Intravenous Q6H PRN Perry Mount, PA-C      . oxyCODONE-acetaminophen (PERCOCET/ROXICET) 5-325 MG per tablet 1 tablet  1 tablet Oral  Q6H PRN Sueanne Margarita, MD   1 tablet at 09/29/13 0955  . ranolazine (RANEXA) 12 hr tablet 500 mg  500 mg Oral BID Dorothy Spark, MD   500 mg at 09/29/13 2206  . sodium chloride 0.9 % injection 3 mL  3 mL Intravenous Q12H Perry Mount, PA-C   3 mL at 09/29/13 1222  . sodium chloride 0.9 % injection 3 mL  3 mL Intravenous PRN Perry Mount, PA-C      . sodium chloride 0.9 % injection 3 mL  3 mL Intravenous Q12H Sherren Mocha, MD   3 mL at 09/29/13 2206  . sodium chloride 0.9 % injection 3 mL  3 mL Intravenous PRN Sherren Mocha, MD   3 mL at 09/28/13 1119  . white petrolatum (VASELINE) gel   Topical PRN Jules Husbands, MD        PE: General appearance: alert, cooperative and no distress Lungs: clear to auscultation bilaterally Heart: regular rate and rhythm and 1/6 sys MM RSB Extremities: No LEE Pulses: 2+ and symmetric Skin: Warm and dry Neurologic: Grossly normal  Lab Results:  No results found for this basename: WBC, HGB, HCT, PLT,  in the last 72 hours BMET  Recent Labs  09/29/13 0324 09/30/13 0447  NA 142 141  K 4.5 4.1  CL 105 103  CO2 25 24  GLUCOSE 101* 94  BUN 15 17  CREATININE 1.05 0.95  CALCIUM 9.6 9.3      Assessment/Plan   Active Problems:   Chest pain   1. CAD/NSTEMI-  cath on 8/13 with occlusion of the distal Cx with recommendation for aggressive medical therapy, she came back on 09/26/2013 with another NSTEMI Troponin 10 again, cath on 8/20 showed plaque rupture in her OM branch since her previous catheterization last week. The vessel was subtotally occluded female with TIMI 1-2 flow beyond the lesion. The patient has been chest pain-free at the time of cath. Per Dr Burt Knack the vessel was unfavorable for PCI and recommended initial medical therapy. If she has refractory angina, PCI would be reasonable.   Continue aspirin, plavix, metoprolol, imdur. Ranolazine was added for chest pain control. Cardiac rehab scheduled for 10/06/13 at [facility].  She has an appt  with Dr. Harl Bowie tomorrow.  I will have it moved out a couple weeks if possible.  DC home today.      HTN - overall controlled  3 Hyperlipidemia- her cholesterol is well controlled at baseline. High dose statin  4. Hypothyroidism - synthroid  5. Pulmonary nodules by CT, stable, has f/u with PCP   LOS: 4 days    HAGER, BRYAN PA-C 09/30/2013 7:15 AM  Patient examined chart reviewed No angina Medical Rx  F/U Branch outpatient Right radial cath site looks great  D/C home today  Jenkins Rouge

## 2013-10-01 ENCOUNTER — Ambulatory Visit: Payer: Medicare Other | Admitting: Cardiology

## 2013-10-01 NOTE — Progress Notes (Unsigned)
Clinical Summary Ms. Southgate is a 68 y.o.female seen today for hospital follow up.  1. CAD - multiple presentations to ER with chest pain - admit 09/20/13 to 09/22/13 with chest pain, NSTEMI - cath 09/20/13 showed occludeddistally,  OM1 60-80%. Treated medically at that time and discharged home - readmitted with chest pain a few days later, repeat cath 09/27/13 showed subtotal occlusion of medium caliber highly tortuous OM worst from prior study, described as unfavorable for PCI, though if refractory angina PCI could be considered per notes.  - headache on imdur, decreaed to 30mg  daily.  - 09/21/13 Echo LVEF 55-60%.    2. HTN   3. Hyperlipidemia  4. DM  5. OSA  Past Medical History  Diagnosis Date  . Hypertension   . Anxiety   . Sleep apnea   . Hypothyroidism   . NSTEMI (non-ST elevated myocardial infarction)     a.  Related to occlusion of the distal Cx. The mid to distal obtuse marginal #1 also contains a significant stenosis that is best treated with medical therapy. Patent RCA/LAD with tortuosity. LM patent. EF 55%.   [name] History of echocardiogram     a. 2D ECHO: 09/21/2013; EF 55-60%; mild concentric hypertrophy, G1DD, mild LA dilation, PA pressure 37. Small RV pericardial effusion     No Known Allergies   Current Outpatient Prescriptions  Medication Sig Dispense Refill  . Ascorbic Acid (VITAMIN C) 1000 MG tablet Take 1,000 mg by mouth daily.      [name] aspirin 81 MG tablet Take 81 mg by mouth daily.      [name] atorvastatin (LIPITOR) 80 MG tablet Take 80 mg by mouth daily.      . clopidogrel (PLAVIX) 75 MG tablet Take 75 mg by mouth daily.      . isosorbide dinitrate (ISORDIL) 30 MG tablet Take 30 mg by mouth 2 (two) times daily.      [name] levothyroxine (SYNTHROID, LEVOTHROID) 100 MCG tablet Take 100 mcg by mouth daily.      . metoprolol succinate (TOPROL-XL) 50 MG 24 hr tablet Take 50 mg by mouth daily. Take with or immediately following a meal.      . Misc Natural Products  (TURMERIC CURCUMIN) CAPS Take 1 capsule by mouth daily as needed. Headache      . Multiple Vitamin (MULTIVITAMIN WITH MINERALS) TABS Take 1 tablet by mouth daily.      . nitroGLYCERIN (NITROSTAT) 0.4 MG SL tablet Place 0.4 mg under the tongue every 5 (five) minutes x 3 doses as needed for chest pain.      . ranolazine (RANEXA) 500 MG 12 hr tablet Take 1 tablet (500 mg total) by mouth 2 (two) times daily.  60 tablet  5   No current facility-administered medications for this visit.     Past Surgical History  Procedure Laterality Date  . Thyroidectomy  10/08/2011    Procedure: THYROIDECTOMY;  Surgeon: [name], MD;  Location: [facility];  Service: General;  Laterality: N/A;  Total  . Cardiac catheterization  ~ 2000  . Coronary angioplasty  09/20/2013  . Vaginal hysterectomy  1993     No Known Allergies    No family history on file.   Social History Ms. Heath reports that she has never smoked. She has never used smokeless tobacco. Ms. Fonner reports that she does not drink alcohol.   Review of Systems CONSTITUTIONAL: No weight loss, fever, chills, weakness or fatigue.  HEENT: Eyes: No visual  loss, blurred vision, double vision or yellow sclerae.No hearing loss, sneezing, congestion, runny nose or sore throat.  SKIN: No rash or itching.  CARDIOVASCULAR:  RESPIRATORY: No shortness of breath, cough or sputum.  GASTROINTESTINAL: No anorexia, nausea, vomiting or diarrhea. No abdominal pain or blood.  GENITOURINARY: No burning on urination, no polyuria NEUROLOGICAL: No headache, dizziness, syncope, paralysis, ataxia, numbness or tingling in the extremities. No change in bowel or bladder control.  MUSCULOSKELETAL: No muscle, back pain, joint pain or stiffness.  LYMPHATICS: No enlarged nodes. No history of splenectomy.  PSYCHIATRIC: No history of depression or anxiety.  ENDOCRINOLOGIC: No reports of sweating, cold or heat intolerance. No polyuria or polydipsia.  [name]   Physical  Examination There were no vitals filed for this visit. There were no vitals filed for this visit.  Gen: resting comfortably, no acute distress HEENT: no scleral icterus, pupils equal round and reactive, no palptable cervical adenopathy,  CV Resp: Clear to auscultation bilaterally GI: abdomen is soft, non-tender, non-distended, normal bowel sounds, no hepatosplenomegaly MSK: extremities are warm, no edema.  Skin: warm, no rash Neuro:  no focal deficits Psych: appropriate affect   Diagnostic Studies 09/20/13 Cath HEMODYNAMICS: Aortic pressure 145/86 mmHg; LV pressure 147/12 mmHg; LVEDP 18 mm mercury  ANGIOGRAPHIC DATA: The left main coronary artery is is widely patent/normal.  The left anterior descending artery is widely patent. A large trifurcating first diagonal arises proximally. The LAD then trifurcates into a large septal perforator, and anterior interventricular groove branch that wraps around the left ventricular apex, and a second diagonal branch is small in distribution. Beyond the proximal segment the LAD, and diagonal branches are highly tortuous but no significant obstruction is seen in the main vessels or branches..  The left circumflex artery is nondominant and gives origin to a large first obtuse marginal which contains moderate to severe mid vessel disease within a region of tortuosity. The mid to distal portion of this marginal contains 60 -80% obstruction that is best treated with medical therapy. The continuation of the circumflex is severely diseased and then totally occluded distally With very small branches noted to fill late by collaterals and antegrade flow, TIMI grade 2. The distribution of the distal circumflex is small and not treatable with PCI.  The right coronary artery is very tortuous, dominant vessel. No obstruction is noted.  LEFT VENTRICULOGRAM: Left ventricular angiogram was done in the 30 RAO projection and revealed normal cavity size and contractility with  an estimated EF of 55%  IMPRESSIONS: 1. Non-ST elevation myocardial infarction related to occlusion of the distal circumflex. The mid to distal obtuse marginal #1 also contains a significant stenosis that is best treated with medical therapy .  2. Widely patent RCA, and LAD. Marked tortuosity is noted in both vascular territories. The left main is widely patent.  3. Overall normal left ventricular function with an estimated ejection fraction of 55%  RECOMMENDATION: Medical therapy to include beta blocker, long-acting nitrate, aspirin and Plavix (30-90 days), and aggressive blood pressure and lipid control.    09/27/13 Cath Hemodynamics:  AO 130/80  LV 134/15  Coronary angiography:  Coronary dominance: right  Left mainstem: The left mainstem is patent without significant obstruction. Divides into the LAD and left circumflex.  Left anterior descending (LAD): The LAD is a large-caliber vessel through its proximal portion. There is a large first diagonal branch. There is no obstructive disease in the proximal LAD her first diagonal. The vessel trifurcates in the midportion into a medium caliber  second diagonal, large septal perforator, and LAD segment it reaches the apex. There is mild diffuse disease noted throughout the mid LAD but there is no significant obstruction.  Left circumflex (LCx): The left circumflex has mild proximal stenosis. There is a first obtuse marginal branch with marked tortuosity and diffuse 80% stenosis leading into a subtotal occlusion with 99% stenosis and TIMI 1 flow beyond the area of occlusion. The AV circumflex extends down and supplies a second OM branch without significant disease.  Right coronary artery (RCA): The RCA is dominant. The large vessels supplying the acute marginal, PDA, and then small PLA branch. The vessel has no significant angiographic disease.  Left ventriculography: There is mild hypokinesis of the mid anterolateral wall. The other LV segments contract  normally. The estimated LVEF is 65%.  Estimated Blood Loss: Minimal  Final Conclusions:  1. Non-ST elevation infarction secondary to subtotal occlusion of a medium caliber, highly tortuous obtuse marginal branch of the left circumflex.  2. Nonobstructive LAD stenosis(mild)  3. Widely patent dominant right coronary artery  4. Mild segmental contraction abnormality the left ventricle with preserved overall LV systolic function  Recommendations: The patient has had a plaque rupture in her OM branch since her previous catheterization last week. The vessel was subtotally occluded female with TIMI 1-2 flow beyond the lesion. The patient has been chest pain-free this afternoon. I think the vessel is unfavorable for PCI and I would initially recommend medical therapy. If she has refractory angina, PCI would be reasonable.    09/21/13 Echo Study Conclusions  - Left ventricle: The cavity size was normal. There was mild concentric hypertrophy. Systolic function was normal. The estimated ejection fraction was in the range of 55% to 60%. Wall motion was normal; there were no regional wall motion abnormalities. Doppler parameters are consistent with abnormal left ventricular relaxation (grade 1 diastolic dysfunction). - Left atrium: The atrium was mildly dilated. - Pulmonary arteries: Systolic pressure was mildly increased. PA peak pressure: 37 mm Hg (S). - Pericardium, extracardiac: A small pericardial effusion was identified along the right ventricular free wall.   Assessment and Plan        Arnoldo Lenis, M.D., F.A.C.C.

## 2013-10-04 ENCOUNTER — Telehealth (HOSPITAL_COMMUNITY): Payer: Self-pay | Admitting: Dietician

## 2013-10-04 NOTE — Telephone Encounter (Signed)
Received voicemail left from Ms. [name] [name] on 10/03/13 at 1417. Called back today at 1420. She reports she was recently hospitalized and will be starting [facility] on 10/06/13. She reports that she was following a strict diet in the hospital, which she was pleased with, as it made her feel better and promoted intentional weight loss. She wants to continue with her therapeutic diet but needs more direction. She reports that she was instructed to call this RD prior starting the cardiac rehab program.  She reports since being discharged from the hospital, she was given 10 frozen meals from her insurance company, [employer]. She reports she enjoys these meals, but does not know what to eat once they are used up.  Reviewed diet recall. Discussed importance of a healthy lifestyle (healthful diet and regular physical activity) to promote general health, well-being, and prevent onset of chronic diseases. Discussed being goal of being healthy vs. Obtaining a certain weight or body type. Educated on plate method and a general, healthful diet that includes low fat dairy, lean meats, whole fruits and vegetables, and whole grains most often. Provided specific examples of how pt could choose more healthful foods in current diet and discussed healthier alternatives to commonly eaten foods. Discussed specific ways to decrease fat, sodium, and sugar in diet.  Offered to send literature to her home. Confirmed mailing address and sent "Heart Healthy Eating Nutrition Therapy" to her home per her request. Encouraged continued participation in Cardiac Rehab program.

## 2013-10-17 ENCOUNTER — Ambulatory Visit (INDEPENDENT_AMBULATORY_CARE_PROVIDER_SITE_OTHER): Payer: Medicare HMO | Admitting: Cardiovascular Disease

## 2013-10-17 ENCOUNTER — Encounter: Payer: Self-pay | Admitting: Cardiovascular Disease

## 2013-10-17 VITALS — BP 123/81 | HR 67 | Ht 63.0 in | Wt 197.0 lb

## 2013-10-17 DIAGNOSIS — I10 Essential (primary) hypertension: Secondary | ICD-10-CM

## 2013-10-17 DIAGNOSIS — Z87898 Personal history of other specified conditions: Secondary | ICD-10-CM

## 2013-10-17 DIAGNOSIS — Z9289 Personal history of other medical treatment: Secondary | ICD-10-CM

## 2013-10-17 DIAGNOSIS — G4733 Obstructive sleep apnea (adult) (pediatric): Secondary | ICD-10-CM

## 2013-10-17 DIAGNOSIS — E785 Hyperlipidemia, unspecified: Secondary | ICD-10-CM

## 2013-10-17 DIAGNOSIS — Z7182 Exercise counseling: Secondary | ICD-10-CM

## 2013-10-17 DIAGNOSIS — F439 Reaction to severe stress, unspecified: Secondary | ICD-10-CM

## 2013-10-17 DIAGNOSIS — I209 Angina pectoris, unspecified: Secondary | ICD-10-CM

## 2013-10-17 DIAGNOSIS — R7303 Prediabetes: Secondary | ICD-10-CM

## 2013-10-17 DIAGNOSIS — I2510 Atherosclerotic heart disease of native coronary artery without angina pectoris: Secondary | ICD-10-CM

## 2013-10-17 DIAGNOSIS — Z713 Dietary counseling and surveillance: Secondary | ICD-10-CM

## 2013-10-17 DIAGNOSIS — I25119 Atherosclerotic heart disease of native coronary artery with unspecified angina pectoris: Secondary | ICD-10-CM

## 2013-10-17 DIAGNOSIS — Z639 Problem related to primary support group, unspecified: Secondary | ICD-10-CM

## 2013-10-17 DIAGNOSIS — R7309 Other abnormal glucose: Secondary | ICD-10-CM

## 2013-10-17 NOTE — Progress Notes (Signed)
Patient ID: [name] [name], female   DOB: 12-12-1945, 68 y.o.   MRN: [id number]      SUBJECTIVE: The patient is a 68 yr old female seen today for hospital follow up. She has a history of CAD with multiple presentations to ER with chest pain. She was admitted 09/20/13 to 09/22/13 with chest pain and sustained a NSTEMI. Cath 09/20/13 showed distally occluded OM1 60-80%. It was treated medically at that time and she was discharged home. She was readmitted with chest pain a few days later, and repeat cath on 09/27/13 showed subtotal occlusion of a medium caliber highly tortuous OM worse from prior study, described as unfavorable for PCI, though if refractory angina PCI could be considered per notes.  She complained of a headache on Imdur which was then decreaed to 30mg  daily.  09/21/13 Echo LVEF 55-60%. PMH also includes HTN, hyperlipidemia, pre-diabetes mellitus, and OSA .  I reviewed all the documentation from her two most recent hospitalizations. Lipid panel on 09/27/2013 demonstrated total cholesterol 110, triglycerides 54, HDL 42, LDL 57. HbA1c 6.2%.  She is now trying to walk more and will commence cardiac rehabilitation the near future. She blends carrots, spinach, and coconut milk and is trying to eat more healthy. She has had a very difficult life with many social stressors and has had to raise children on her own as her husband passed away, as well as an adopted child. She believes she allowed the stress to compound and did not take care of herself which led to her heart disease. She has lost 8 or 9 pounds. She has not had to use any sublingual nitroglycerin and she very seldom gets fleeting chest sensations but not frank pain per se. Ranexa caused her to have chest pain so she stopped taking it. She is apparently on both labetalol prescribed by her primary provider as well as metoprolol succinate prescribed by cardiologists during her most recent hospitalization.   Review of Systems: As per  "subjective", otherwise negative.  No Known Allergies  Current Outpatient Prescriptions  Medication Sig Dispense Refill  . Ascorbic Acid (VITAMIN C) 1000 MG tablet Take 1,000 mg by mouth daily.      [name] aspirin 81 MG tablet Take 81 mg by mouth daily.      [name] atorvastatin (LIPITOR) 80 MG tablet Take 80 mg by mouth daily.      . clopidogrel (PLAVIX) 75 MG tablet Take 75 mg by mouth daily.      . isosorbide dinitrate (ISORDIL) 30 MG tablet Take 30 mg by mouth 2 (two) times daily.      [name] levothyroxine (SYNTHROID, LEVOTHROID) 100 MCG tablet Take 100 mcg by mouth daily.      . metoprolol succinate (TOPROL-XL) 50 MG 24 hr tablet Take 50 mg by mouth daily. Take with or immediately following a meal.      . Misc Natural Products (TURMERIC CURCUMIN) CAPS Take 1 capsule by mouth daily as needed. Headache      . Multiple Vitamin (MULTIVITAMIN WITH MINERALS) TABS Take 1 tablet by mouth daily.      . nitroGLYCERIN (NITROSTAT) 0.4 MG SL tablet Place 0.4 mg under the tongue every 5 (five) minutes x 3 doses as needed for chest pain.      . ranolazine (RANEXA) 500 MG 12 hr tablet Take 1 tablet (500 mg total) by mouth 2 (two) times daily.  60 tablet  5   No current facility-administered medications for this visit.    Past Medical  History  Diagnosis Date  . Hypertension   . Anxiety   . Sleep apnea   . Hypothyroidism   . NSTEMI (non-ST elevated myocardial infarction)     a.  Related to occlusion of the distal Cx. The mid to distal obtuse marginal #1 also contains a significant stenosis that is best treated with medical therapy. Patent RCA/LAD with tortuosity. LM patent. EF 55%.   [name] History of echocardiogram     a. 2D ECHO: 09/21/2013; EF 55-60%; mild concentric hypertrophy, G1DD, mild LA dilation, PA pressure 37. Small RV pericardial effusion    Past Surgical History  Procedure Laterality Date  . Thyroidectomy  10/08/2011    Procedure: THYROIDECTOMY;  Surgeon: [name], MD;  Location: [facility];   Service: General;  Laterality: N/A;  Total  . Cardiac catheterization  ~ 2000  . Coronary angioplasty  09/20/2013  . Vaginal hysterectomy  1993    History   Social History  . Marital Status: Widowed    Spouse Name: N/A    Number of Children: N/A  . Years of Education: N/A   Occupational History  . Not on file.   Social History Main Topics  . Smoking status: Never Smoker   . Smokeless tobacco: Never Used  . Alcohol Use: No  . Drug Use: No  . Sexual Activity: No   Other Topics Concern  . Not on file   Social History Narrative  . No narrative on file     Filed Vitals:   10/17/13 0932  Height: 5\' 3"  (1.6 m)  Weight: 197 lb (89.359 kg)    PHYSICAL EXAM General: NAD HEENT: Normal. Neck: No JVD, no thyromegaly. Lungs: Clear to auscultation bilaterally with normal respiratory effort. CV: Nondisplaced PMI.  Regular rate and rhythm, normal S1/S2, no S3/S4, no murmur. No pretibial or periankle edema.  No carotid bruit.  Normal pedal pulses.  Abdomen: Soft, nontender, no hepatosplenomegaly, no distention.  Neurologic: Alert and oriented x 3.  Psych: Normal affect. Skin: Normal. Musculoskeletal: Normal range of motion, no gross deformities. Extremities: No clubbing or cyanosis.   ECG: Most recent ECG reviewed.  Diagnostic Studies  09/20/13 Cath  HEMODYNAMICS: Aortic pressure 145/86 mmHg; LV pressure 147/12 mmHg; LVEDP 18 mm mercury  ANGIOGRAPHIC DATA: The left main coronary artery is is widely patent/normal.  The left anterior descending artery is widely patent. A large trifurcating first diagonal arises proximally. The LAD then trifurcates into a large septal perforator, and anterior interventricular groove branch that wraps around the left ventricular apex, and a second diagonal branch is small in distribution. Beyond the proximal segment the LAD, and diagonal branches are highly tortuous but no significant obstruction is seen in the main vessels or branches..  The left  circumflex artery is nondominant and gives origin to a large first obtuse marginal which contains moderate to severe mid vessel disease within a region of tortuosity. The mid to distal portion of this marginal contains 60 -80% obstruction that is best treated with medical therapy. The continuation of the circumflex is severely diseased and then totally occluded distally With very small branches noted to fill late by collaterals and antegrade flow, TIMI grade 2. The distribution of the distal circumflex is small and not treatable with PCI.  The right coronary artery is very tortuous, dominant vessel. No obstruction is noted.  LEFT VENTRICULOGRAM: Left ventricular angiogram was done in the 30 RAO projection and revealed normal cavity size and contractility with an estimated EF of 55%  IMPRESSIONS:  1. Non-ST elevation myocardial infarction related to occlusion of the distal circumflex. The mid to distal obtuse marginal #1 also contains a significant stenosis that is best treated with medical therapy .  2. Widely patent RCA, and LAD. Marked tortuosity is noted in both vascular territories. The left main is widely patent.  3. Overall normal left ventricular function with an estimated ejection fraction of 55%  RECOMMENDATION: Medical therapy to include beta blocker, long-acting nitrate, aspirin and Plavix (30-90 days), and aggressive blood pressure and lipid control.   09/27/13 Cath  Hemodynamics:  AO 130/80  LV 134/15  Coronary angiography:  Coronary dominance: right  Left mainstem: The left mainstem is patent without significant obstruction. Divides into the LAD and left circumflex.  Left anterior descending (LAD): The LAD is a large-caliber vessel through its proximal portion. There is a large first diagonal branch. There is no obstructive disease in the proximal LAD her first diagonal. The vessel trifurcates in the midportion into a medium caliber second diagonal, large septal perforator, and LAD  segment it reaches the apex. There is mild diffuse disease noted throughout the mid LAD but there is no significant obstruction.  Left circumflex (LCx): The left circumflex has mild proximal stenosis. There is a first obtuse marginal branch with marked tortuosity and diffuse 80% stenosis leading into a subtotal occlusion with 99% stenosis and TIMI 1 flow beyond the area of occlusion. The AV circumflex extends down and supplies a second OM branch without significant disease.  Right coronary artery (RCA): The RCA is dominant. The large vessels supplying the acute marginal, PDA, and then small PLA branch. The vessel has no significant angiographic disease.  Left ventriculography: There is mild hypokinesis of the mid anterolateral wall. The other LV segments contract normally. The estimated LVEF is 65%.  Estimated Blood Loss: Minimal   Final Conclusions:  1. Non-ST elevation infarction secondary to subtotal occlusion of a medium caliber, highly tortuous obtuse marginal branch of the left circumflex.  2. Nonobstructive LAD stenosis(mild)  3. Widely patent dominant right coronary artery  4. Mild segmental contraction abnormality the left ventricle with preserved overall LV systolic function  Recommendations: The patient has had a plaque rupture in her OM branch since her previous catheterization last week. The vessel was subtotally occluded female with TIMI 1-2 flow beyond the lesion. The patient has been chest pain-free this afternoon. I think the vessel is unfavorable for PCI and I would initially recommend medical therapy. If she has refractory angina, PCI would be reasonable.    09/21/13 Echo  Study Conclusions  - Left ventricle: The cavity size was normal. There was mild concentric hypertrophy. Systolic function was normal. The estimated ejection fraction was in the range of 55% to 60%. Wall motion was normal; there were no regional wall motion abnormalities. Doppler parameters are consistent with  abnormal left ventricular relaxation (grade 1 diastolic dysfunction). - Left atrium: The atrium was mildly dilated. - Pulmonary arteries: Systolic pressure was mildly increased. PA peak pressure: 37 mm Hg (S). - Pericardium, extracardiac: A small pericardial effusion was identified along the right ventricular free wall.       ASSESSMENT AND PLAN: 1. CAD: Stable ischemic heart disease. Continue aspirin 81 mg, Lipitor 80 mg, Plavix 75 mg and Toprol-XL 50 mg. Ranexa caused her to have chest pain so she stopped taking it. Labetalol will be discontinued. 2. Essential HTN: Well controlled on current therapy which includes labetalol and Toprol-XL. Will need to monitor given discontinuation of labetalol. 3. Hyperlipidemia: On  high dose Lipitor. Lipids from 09/27/13 as noted above. 4. Pre-type 2 diabetes: HbA1C 6.2%. Extensive dietary and exercise counseling provided. 5. OSA: Treatment deferred to PCP.  Dispo: f/u 3 months.  Time spent: 40 minutes, of which >50% spent reviewing all relevant hospital records, cardiac testing, and couseling patient on methods to decrease stress including exercise and diet.  Kate Sable, M.D., F.A.C.C.

## 2013-10-17 NOTE — Patient Instructions (Addendum)
Continue all current medications. Follow up in  3 months 

## 2013-11-02 ENCOUNTER — Telehealth: Payer: Self-pay | Admitting: *Deleted

## 2013-11-02 NOTE — Telephone Encounter (Signed)
Patient called requesting refill on Percocet that was given to her in the hospital.  Stated that when she was here in the office for visit on 10/17/2013 she was taking Imdur 30mg  twice a day, but she did not have it with her so did not get on medication list.  Stated that she had been out, but just now found prescription that was given to her upon hospital d/c & had now been taking again about 1 1/2 weeks.  Stated that when she was in hospital, this medication caused her to have a really bad headache & they give her Percocet for this.  Informed patient that we do not prescribe that type of medication here & definitely not for H/A due to Imdur.  Usually medication adjustments are made.  Patient had very confusing explanation about this.  Advised her to only take the one Imdur for now instead of two until we discussed further with Dr. Bronson Ing.  Message will be sent to provider for advice.  Patient verbalized understanding.

## 2013-11-05 NOTE — Telephone Encounter (Signed)
Patient notified.  Does not want to take this medication at all.  She wants to know if you can prescribe something different.

## 2013-11-05 NOTE — Telephone Encounter (Signed)
Agree with Imdur 30 mg daily.

## 2013-11-06 ENCOUNTER — Encounter (HOSPITAL_COMMUNITY)
Admission: RE | Admit: 2013-11-06 | Discharge: 2013-11-06 | Disposition: A | Discharge status: Home / Self Care | Payer: Commercial Managed Care - HMO | Source: Ambulatory Visit | Attending: Cardiovascular Disease | Admitting: Cardiovascular Disease

## 2013-11-06 ENCOUNTER — Encounter (HOSPITAL_COMMUNITY): Payer: Self-pay

## 2013-11-06 VITALS — BP 138/90 | HR 72 | Ht 63.0 in | Wt 198.1 lb

## 2013-11-06 DIAGNOSIS — I214 Non-ST elevation (NSTEMI) myocardial infarction: Secondary | ICD-10-CM

## 2013-11-06 NOTE — Telephone Encounter (Signed)
She reportedly did not tolerate Ranexa, which is the only other viable option.

## 2013-11-06 NOTE — Patient Instructions (Signed)
Pt has finished orientation and is scheduled to start CR on 11/12/13 at 8:15 am. Pt has been instructed to arrive to class 15 minutes early for scheduled class. Pt has been instructed to wear comfortable clothing and shoes with rubber soles. Pt has been told to take their medications 1 hour prior to coming to class.  If the patient is not going to attend class, he/she has been instructed to call.

## 2013-11-06 NOTE — Progress Notes (Signed)
Patient referred to Cardiac Rehab by Dr. Tamala Julian due to NSTEMI 410.7. Dr. Bronson Ing is her cardiologist and Dr. Legrand Rams is her PCP. During orientation advised patient on arrival and appointment times what to wear, what to do before, during and after exercise. Reviewed attendance and class policy. Talked about inclement weather and class consultation policy. Pt is scheduled to start Cardiac Rehab on 11/06/13 at 8:15 am. Pt was advised to come to class 5 minutes before class starts. He was also given instructions on meeting with the dietician and attending the Family Structure classes. Patient did not take BP medications on day of orientation so BP was elevated. Pt is eager to get started. Will do 6 minute walk test on first day of exercise due to BP being elevated.

## 2013-11-08 NOTE — Telephone Encounter (Signed)
Discussed below with patient.  Stated she has continued to take Imdur 30mg  DAILY.  Patient states that she continues to have the headache.  Suggested that she may even try doing 1/2 of the 30mg  tablet, since the Ranexa is not an option for her.

## 2013-11-12 ENCOUNTER — Encounter (HOSPITAL_COMMUNITY): Payer: Commercial Managed Care - HMO

## 2013-11-14 ENCOUNTER — Encounter (HOSPITAL_COMMUNITY)
Admission: RE | Admit: 2013-11-14 | Discharge: 2013-11-14 | Disposition: A | Discharge status: Home / Self Care | Payer: Commercial Managed Care - HMO | Source: Ambulatory Visit | Attending: Cardiovascular Disease | Admitting: Cardiovascular Disease

## 2013-11-14 DIAGNOSIS — I252 Old myocardial infarction: Secondary | ICD-10-CM | POA: Diagnosis not present

## 2013-11-14 DIAGNOSIS — Z5189 Encounter for other specified aftercare: Secondary | ICD-10-CM | POA: Diagnosis present

## 2013-11-15 ENCOUNTER — Telehealth: Payer: Self-pay | Admitting: *Deleted

## 2013-11-15 NOTE — Telephone Encounter (Signed)
Message copied by Laurine Blazer on Thu Nov 15, 2013  8:08 AM ------      Message from: Kate Sable A      Created: Wed Nov 14, 2013  4:19 PM      Regarding: RE: Patient concern       I will start amlodipine 5 mg daily. Thank you for bringing this to my attention, Diane.            Dr. Bronson Ing                  ----- Message -----         From: Gean Maidens         Sent: 11/14/2013   4:02 PM           To: Herminio Commons, MD      Subject: Patient concern                                          Dr. Bronson Ing,      Your patient [name] [name] DOB 09-09-1945 started Cardiac Rehab today 11/14/13. Her BP was elevated 154/88 at rest, 160/102 during 6 minute walk test, 144/80 at end of class at rest. When she came in for her orientation her BP was also elevated when she stood up it was 160/110 (forgot her meds), then she took them while being here and then I took her BP in about an hour and it was 158/110. She says she is concerned. She is taking Toprol, however she said it has been tweaked because it gives her a really bad headache. I am concerned as well so I wanted to bring this to your attention. Please advise if there is something you would like for [place] to tell her or for [place] to do. Thanks      Russella Dar       ------

## 2013-11-15 NOTE — Telephone Encounter (Signed)
Stated she has been on this in the past (about 2 years ago) & caused lip swelling.  Stated that was the only medication she was on at that time.

## 2013-11-16 ENCOUNTER — Encounter (HOSPITAL_COMMUNITY)
Admission: RE | Admit: 2013-11-16 | Discharge: 2013-11-16 | Disposition: A | Discharge status: Home / Self Care | Payer: Commercial Managed Care - HMO | Source: Ambulatory Visit | Attending: Cardiovascular Disease | Admitting: Cardiovascular Disease

## 2013-11-16 DIAGNOSIS — Z5189 Encounter for other specified aftercare: Secondary | ICD-10-CM | POA: Diagnosis not present

## 2013-11-16 NOTE — Telephone Encounter (Signed)
Lisinopril 5 mg daily.

## 2013-11-16 NOTE — Telephone Encounter (Signed)
Left message to return call 

## 2013-11-19 ENCOUNTER — Telehealth: Payer: Self-pay | Admitting: Cardiovascular Disease

## 2013-11-19 ENCOUNTER — Encounter (HOSPITAL_COMMUNITY)
Admission: RE | Admit: 2013-11-19 | Discharge: 2013-11-19 | Disposition: A | Discharge status: Home / Self Care | Payer: Commercial Managed Care - HMO | Source: Ambulatory Visit | Attending: Cardiovascular Disease | Admitting: Cardiovascular Disease

## 2013-11-19 DIAGNOSIS — Z5189 Encounter for other specified aftercare: Secondary | ICD-10-CM | POA: Diagnosis not present

## 2013-11-19 NOTE — Telephone Encounter (Signed)
[name] [name] at 11/19/2013 10:55 AM      Status: Signed            Patient walked in office.    Ranexa hurts her heart she stated   isosorb mom[facility] ER - hurts her head so bad that she has to start in a dark room.  Would like to know if there is anything else she can take in place of this.

## 2013-11-19 NOTE — Telephone Encounter (Signed)
Patient walked in office.   Ranexa hurts her heart she stated  isosorb [facility] ER - hurts her head so bad that she has to start in a dark room.  Would like to know if there is anything else she can take in place of this.

## 2013-11-19 NOTE — Telephone Encounter (Signed)
Discussed all below with patient.  Stated she had gotten mixed up on her medications & Cardiac rehab person labeled her meds for her.  Reminded her again that there are no viable options for her in regards to Imdur or Ranexa.  Stated that she even tried 1/2 of a tablet & still bothered her so she has stopped Imdur all together.  Stated that she did take one her Xanax & layed down which relieved her pain.  Stated that she could not take the Amlodipine due to lip swelling.  Informed patient of new medication that Dr. Bronson Ing would like to try.  Patient stated that she could not take the Lisinopril either as Dr. Legrand Rams stopped this due to side effects.  She could not recall what the side effects were.  Stated since cardiac rehab going over all her medications again, she realized that she had not been taking her Toprol as she should have.  States that she will now start taking every day as prescribed.  Stated that she will try this for a while before trying anything else new.  Informed patient to notify office if her BP readings continue to stay elevated after this.  Patient verbalized understanding.

## 2013-11-19 NOTE — Telephone Encounter (Signed)
Addressed in previous phone note.  

## 2013-11-21 ENCOUNTER — Encounter (HOSPITAL_COMMUNITY)
Admission: RE | Admit: 2013-11-21 | Discharge: 2013-11-21 | Disposition: A | Discharge status: Home / Self Care | Payer: Commercial Managed Care - HMO | Source: Ambulatory Visit | Attending: Cardiovascular Disease | Admitting: Cardiovascular Disease

## 2013-11-21 DIAGNOSIS — Z5189 Encounter for other specified aftercare: Secondary | ICD-10-CM | POA: Diagnosis not present

## 2013-11-23 ENCOUNTER — Encounter (HOSPITAL_COMMUNITY)
Admission: RE | Admit: 2013-11-23 | Discharge: 2013-11-23 | Disposition: A | Discharge status: Home / Self Care | Payer: Commercial Managed Care - HMO | Source: Ambulatory Visit | Attending: Cardiovascular Disease | Admitting: Cardiovascular Disease

## 2013-11-23 DIAGNOSIS — Z5189 Encounter for other specified aftercare: Secondary | ICD-10-CM | POA: Diagnosis not present

## 2013-11-26 ENCOUNTER — Encounter (HOSPITAL_COMMUNITY): Payer: Commercial Managed Care - HMO

## 2013-11-28 ENCOUNTER — Encounter (HOSPITAL_COMMUNITY)
Admission: RE | Admit: 2013-11-28 | Discharge: 2013-11-28 | Disposition: A | Discharge status: Home / Self Care | Payer: Commercial Managed Care - HMO | Source: Ambulatory Visit | Attending: Cardiovascular Disease | Admitting: Cardiovascular Disease

## 2013-11-28 DIAGNOSIS — Z5189 Encounter for other specified aftercare: Secondary | ICD-10-CM | POA: Diagnosis not present

## 2013-11-30 ENCOUNTER — Encounter (HOSPITAL_COMMUNITY)
Admission: RE | Admit: 2013-11-30 | Discharge: 2013-11-30 | Disposition: A | Discharge status: Home / Self Care | Payer: Commercial Managed Care - HMO | Source: Ambulatory Visit | Attending: Cardiovascular Disease | Admitting: Cardiovascular Disease

## 2013-11-30 DIAGNOSIS — Z5189 Encounter for other specified aftercare: Secondary | ICD-10-CM | POA: Diagnosis not present

## 2013-12-03 ENCOUNTER — Encounter (HOSPITAL_COMMUNITY)
Admission: RE | Admit: 2013-12-03 | Discharge: 2013-12-03 | Disposition: A | Discharge status: Home / Self Care | Payer: Commercial Managed Care - HMO | Source: Ambulatory Visit | Attending: Cardiovascular Disease | Admitting: Cardiovascular Disease

## 2013-12-03 DIAGNOSIS — Z5189 Encounter for other specified aftercare: Secondary | ICD-10-CM | POA: Diagnosis not present

## 2013-12-03 NOTE — Progress Notes (Signed)
Cardiac Rehabilitation Program Outcomes Report   Orientation:  11/06/13 Graduate Date:  tbd Discharge Date:  tbd # of sessions completed: 3  Cardiologist: Bronson Ing Family MD:  Morton Amy Time:  0815  A.  Exercise Program:  Tolerates exercise @ 3.61 METS for 15 minutes and Walk Test Results:  Pre: 2.8 mets  B.  Mental Health:  Good mental attitude  C.  Education/Instruction/Skills  Accurately checks own pulse.  Rest:  69  Exercise:  94  Uses Perceived Exertion Scale and/or Dyspnea Scale  D.  Nutrition/Weight Control/Body Composition:  Adherence to prescribed nutrition program: good    E.  Blood Lipids    Lab Results  Component Value Date   CHOL 110 09/27/2013   HDL 42 09/27/2013   LDLCALC 57 09/27/2013   TRIG 54 09/27/2013   CHOLHDL 2.6 09/27/2013    F.  Lifestyle Changes:  Making positive lifestyle changes  G.  Symptoms noted with exercise:  Asymptomatic  Report Completed By:  Benay Pillow RN   Comments:  First week progress note.

## 2013-12-05 ENCOUNTER — Encounter (HOSPITAL_COMMUNITY)
Admission: RE | Admit: 2013-12-05 | Discharge: 2013-12-05 | Disposition: A | Discharge status: Home / Self Care | Payer: Commercial Managed Care - HMO | Source: Ambulatory Visit | Attending: Cardiovascular Disease | Admitting: Cardiovascular Disease

## 2013-12-05 ENCOUNTER — Encounter: Payer: Self-pay | Admitting: Cardiovascular Disease

## 2013-12-05 ENCOUNTER — Ambulatory Visit (INDEPENDENT_AMBULATORY_CARE_PROVIDER_SITE_OTHER): Payer: Medicare HMO | Admitting: Cardiovascular Disease

## 2013-12-05 VITALS — BP 137/82 | HR 81 | Ht 63.0 in | Wt 252.0 lb

## 2013-12-05 DIAGNOSIS — G4733 Obstructive sleep apnea (adult) (pediatric): Secondary | ICD-10-CM

## 2013-12-05 DIAGNOSIS — Z5189 Encounter for other specified aftercare: Secondary | ICD-10-CM | POA: Diagnosis not present

## 2013-12-05 DIAGNOSIS — R7303 Prediabetes: Secondary | ICD-10-CM

## 2013-12-05 DIAGNOSIS — E785 Hyperlipidemia, unspecified: Secondary | ICD-10-CM

## 2013-12-05 DIAGNOSIS — F439 Reaction to severe stress, unspecified: Secondary | ICD-10-CM

## 2013-12-05 DIAGNOSIS — I10 Essential (primary) hypertension: Secondary | ICD-10-CM

## 2013-12-05 DIAGNOSIS — R7309 Other abnormal glucose: Secondary | ICD-10-CM

## 2013-12-05 DIAGNOSIS — I25119 Atherosclerotic heart disease of native coronary artery with unspecified angina pectoris: Secondary | ICD-10-CM

## 2013-12-05 DIAGNOSIS — Z638 Other specified problems related to primary support group: Secondary | ICD-10-CM

## 2013-12-05 MED ORDER — CHLORTHALIDONE 25 MG PO TABS
25.0000 mg | ORAL_TABLET | Freq: Every day | ORAL | Status: DC
Start: 2013-12-05 — End: 2014-03-25

## 2013-12-05 NOTE — Patient Instructions (Signed)
Your physician recommends that you schedule a follow-up appointment in: 3 months. Your physician has recommended you make the following change in your medication:  Start chlorthalidone 25 mg daily. Continue all other medications the same. Your physician recommends that you have lab work in 5 days of starting chlorthalidone. Please have a BMET done around 12/10/13. No appointment necessary.

## 2013-12-05 NOTE — Progress Notes (Signed)
Patient ID: [name] [name], female   DOB: [birth year], 68 y.o.   MRN: [id number]      SUBJECTIVE: I most recently evaluated the patient on 10/17/13. She has been having problems with high blood pressure, first reported to me by Russella Dar in cardiac rehabilitation. In summary, she has a history of CAD with multiple presentations to the ER with chest pain. She was admitted 09/20/13 to 09/22/13 with chest pain and sustained a NSTEMI. Cath 09/20/13 showed distally occluded OM1 60-80%. It was treated medically at that time and she was discharged home. She was readmitted with chest pain a few days later, and repeat cath on 09/27/13 showed subtotal occlusion of a medium caliber highly tortuous OM worse from prior study, described as unfavorable for PCI, though if refractory angina PCI could be considered per notes.  09/21/13 Echo LVEF 55-60%.  PMH also includes HTN, hyperlipidemia, pre-diabetes mellitus, and OSA .  She has been unable to tolerate Ranexa, Imdur, amlodipine, and lisinopril.  She has been enjoying cardiac rehabilitation. BP at cardiac rehab reportedly as 162/100. Of note, she has many social stressors at home which have been difficult for her to deal with. She seldom has chest pain.    Review of Systems: As per "subjective", otherwise negative.  Allergies  Allergen Reactions  . Amlodipine     Lip swelling per patient.   . Lisinopril     Dr. Legrand Rams stopped due to side effects per patient.  Could remember what side effects were.    [name] [Ranolazine]     Chest pain    Current Outpatient Prescriptions  Medication Sig Dispense Refill  . ALPRAZolam (XANAX) 0.5 MG tablet Take 0.5 mg by mouth as needed.       . Ascorbic Acid (VITAMIN C) 1000 MG tablet Take 1,000 mg by mouth daily.      [name] aspirin 81 MG tablet Take 81 mg by mouth daily.      [name] atorvastatin (LIPITOR) 80 MG tablet Take 80 mg by mouth daily.      . clopidogrel (PLAVIX) 75 MG tablet Take 75 mg by mouth daily.      [name]  levothyroxine (SYNTHROID, LEVOTHROID) 100 MCG tablet Take 100 mcg by mouth daily.      . metoprolol succinate (TOPROL-XL) 50 MG 24 hr tablet Take 50 mg by mouth daily. Take with or immediately following a meal.      . Multiple Vitamin (MULTIVITAMIN WITH MINERALS) TABS Take 1 tablet by mouth daily.      . nitroGLYCERIN (NITROSTAT) 0.4 MG SL tablet Place 0.4 mg under the tongue every 5 (five) minutes x 3 doses as needed for chest pain.       No current facility-administered medications for this visit.    Past Medical History  Diagnosis Date  . Hypertension   . Anxiety   . Sleep apnea   . Hypothyroidism   . NSTEMI (non-ST elevated myocardial infarction)     a.  Related to occlusion of the distal Cx. The mid to distal obtuse marginal #1 also contains a significant stenosis that is best treated with medical therapy. Patent RCA/LAD with tortuosity. LM patent. EF 55%.   [name] History of echocardiogram     a. 2D ECHO: 09/21/2013; EF 55-60%; mild concentric hypertrophy, G1DD, mild LA dilation, PA pressure 37. Small RV pericardial effusion    Past Surgical History  Procedure Laterality Date  . Thyroidectomy  10/08/2011    Procedure: THYROIDECTOMY;  Surgeon: [name],  MD;  Location: [facility];  Service: General;  Laterality: N/A;  Total  . Cardiac catheterization  ~ 2000  . Coronary angioplasty  09/20/2013  . Vaginal hysterectomy  1993    History   Social History  . Marital Status: Widowed    Spouse Name: N/A    Number of Children: N/A  . Years of Education: N/A   Occupational History  . Not on file.   Social History Main Topics  . Smoking status: Never Smoker   . Smokeless tobacco: Never Used  . Alcohol Use: No  . Drug Use: No  . Sexual Activity: No   Other Topics Concern  . Not on file   Social History Narrative  . No narrative on file     Filed Vitals:   12/05/13 1030  Height: 5\' 3"  (1.6 m)  Weight: 252 lb (114.306 kg)   BP 137/82  Pulse 81  SpO2 99%    PHYSICAL  EXAM General: NAD HEENT: Normal. Neck: No JVD, no thyromegaly. Lungs: Clear to auscultation bilaterally with normal respiratory effort. CV: Nondisplaced PMI.  Regular rate and rhythm, normal S1/S2, no S3/S4, no murmur. No pretibial or periankle edema.  No carotid bruit.  Normal pedal pulses.  Abdomen: Soft, nontender, no hepatosplenomegaly, no distention.  Neurologic: Alert and oriented x 3.  Psych: Normal affect. Skin: Normal. Musculoskeletal: Normal range of motion, no gross deformities. Extremities: No clubbing or cyanosis.   ECG: Most recent ECG reviewed.      ASSESSMENT AND PLAN: 1. CAD: Stable ischemic heart disease. Continue aspirin 81 mg, Lipitor 80 mg, Plavix 75 mg and Toprol-XL 50 mg. Ranexa caused her to have chest pain so she stopped taking it. Has been unable to tolerate Imdur and amlodipine as well. 2. Essential HTN: Well controlled today but elevated in cardiac rehab on current therapy which includes Toprol-XL. Both amlodipine and lisinopril caused lip swelling. I will start chlorthalidone 25 mg daily and check a BMET five days afterwards to assess renal function and potassium levels. 3. Hyperlipidemia: On high dose Lipitor. Lipids from 09/27/13 previously reviewed.  4. Pre-type 2 diabetes: HbA1C 6.2%. Extensive dietary and exercise counseling provided at last visit.  5. OSA: Treatment deferred to PCP.  Dispo: f/u 3 months.  Kate Sable, M.D., F.A.C.C.

## 2013-12-07 ENCOUNTER — Encounter (HOSPITAL_COMMUNITY)
Admission: RE | Admit: 2013-12-07 | Discharge: 2013-12-07 | Disposition: A | Discharge status: Home / Self Care | Payer: Commercial Managed Care - HMO | Source: Ambulatory Visit | Attending: Cardiovascular Disease | Admitting: Cardiovascular Disease

## 2013-12-07 DIAGNOSIS — Z5189 Encounter for other specified aftercare: Secondary | ICD-10-CM | POA: Diagnosis not present

## 2013-12-10 ENCOUNTER — Encounter: Payer: Self-pay | Admitting: Cardiovascular Disease

## 2013-12-10 ENCOUNTER — Encounter (HOSPITAL_COMMUNITY): Payer: Commercial Managed Care - HMO

## 2013-12-10 ENCOUNTER — Telehealth: Payer: Self-pay | Admitting: *Deleted

## 2013-12-10 NOTE — Telephone Encounter (Signed)
-----   Message from Herminio Commons, MD sent at 11/28/2013 10:03 AM EDT ----- Regarding: FW: Patients Concerns Can you please find out what she's actually taking for BP control at present? Thanks.  ----- Message -----    From: Gean Maidens    Sent: 11/28/2013   8:57 AM      To: Herminio Commons, MD Subject: Patients Concerns                              Dr. Bronson Ing, Your patient [name] [name] came to use this elevated BP again. Pre BP 162/108. She was exercising on the Nustep at a very low resistance level of 1. Her BP went up to 170/92. She began to c/o of chest discomfort after raising the level to 1. Her exit BP 160/82. I really don't know what else to do. She feels she needs an appointment. Please advise. Thanks

## 2013-12-12 ENCOUNTER — Encounter (HOSPITAL_COMMUNITY)
Admission: RE | Admit: 2013-12-12 | Discharge: 2013-12-12 | Disposition: A | Discharge status: Home / Self Care | Payer: Commercial Managed Care - HMO | Source: Ambulatory Visit | Attending: Cardiovascular Disease | Admitting: Cardiovascular Disease

## 2013-12-12 DIAGNOSIS — I252 Old myocardial infarction: Secondary | ICD-10-CM | POA: Insufficient documentation

## 2013-12-12 DIAGNOSIS — Z5189 Encounter for other specified aftercare: Secondary | ICD-10-CM | POA: Insufficient documentation

## 2013-12-12 NOTE — Patient Instructions (Signed)
c 

## 2013-12-13 NOTE — Telephone Encounter (Signed)
Patient had OV on 12/05/13 with Dr. Bronson Ing.  Started on Chlorthalidone 25mg  daily.  Stated she is doing much better.  BP yesterday at cardiac rehab was 140/85.  States that she now has her own BP monitor for her to check at home.  She will do her labs soon as possible.  Advised to call back with any future concerns / elevation in BP readings.

## 2013-12-14 ENCOUNTER — Encounter (HOSPITAL_COMMUNITY): Payer: Commercial Managed Care - HMO

## 2013-12-17 ENCOUNTER — Encounter (HOSPITAL_COMMUNITY)
Admission: RE | Admit: 2013-12-17 | Discharge: 2013-12-17 | Disposition: A | Discharge status: Home / Self Care | Payer: Commercial Managed Care - HMO | Source: Ambulatory Visit | Attending: Cardiovascular Disease | Admitting: Cardiovascular Disease

## 2013-12-17 DIAGNOSIS — Z5189 Encounter for other specified aftercare: Secondary | ICD-10-CM | POA: Diagnosis not present

## 2013-12-19 ENCOUNTER — Encounter (HOSPITAL_COMMUNITY)
Admission: RE | Admit: 2013-12-19 | Discharge: 2013-12-19 | Disposition: A | Discharge status: Home / Self Care | Payer: Commercial Managed Care - HMO | Source: Ambulatory Visit | Attending: Cardiovascular Disease | Admitting: Cardiovascular Disease

## 2013-12-19 DIAGNOSIS — Z5189 Encounter for other specified aftercare: Secondary | ICD-10-CM | POA: Diagnosis not present

## 2013-12-20 ENCOUNTER — Telehealth: Payer: Self-pay | Admitting: *Deleted

## 2013-12-20 NOTE — Telephone Encounter (Signed)
-----   Message from Herminio Commons, MD sent at 12/19/2013  3:50 PM EST ----- Ok.

## 2013-12-20 NOTE — Telephone Encounter (Signed)
Returned your call.

## 2013-12-21 ENCOUNTER — Encounter (HOSPITAL_COMMUNITY)
Admission: RE | Admit: 2013-12-21 | Discharge: 2013-12-21 | Disposition: A | Discharge status: Home / Self Care | Payer: Commercial Managed Care - HMO | Source: Ambulatory Visit | Attending: Cardiovascular Disease | Admitting: Cardiovascular Disease

## 2013-12-21 DIAGNOSIS — Z5189 Encounter for other specified aftercare: Secondary | ICD-10-CM | POA: Diagnosis not present

## 2013-12-21 NOTE — Telephone Encounter (Signed)
Patient informed. 

## 2013-12-24 ENCOUNTER — Encounter (HOSPITAL_COMMUNITY)
Admission: RE | Admit: 2013-12-24 | Discharge: 2013-12-24 | Disposition: A | Discharge status: Home / Self Care | Payer: Commercial Managed Care - HMO | Source: Ambulatory Visit | Attending: Cardiovascular Disease | Admitting: Cardiovascular Disease

## 2013-12-24 DIAGNOSIS — Z5189 Encounter for other specified aftercare: Secondary | ICD-10-CM | POA: Diagnosis not present

## 2013-12-26 ENCOUNTER — Encounter (HOSPITAL_COMMUNITY)
Admission: RE | Admit: 2013-12-26 | Discharge: 2013-12-26 | Disposition: A | Discharge status: Home / Self Care | Payer: Commercial Managed Care - HMO | Source: Ambulatory Visit | Attending: Cardiovascular Disease | Admitting: Cardiovascular Disease

## 2013-12-26 DIAGNOSIS — Z5189 Encounter for other specified aftercare: Secondary | ICD-10-CM | POA: Diagnosis not present

## 2013-12-28 ENCOUNTER — Encounter (HOSPITAL_COMMUNITY)
Admission: RE | Admit: 2013-12-28 | Discharge: 2013-12-28 | Disposition: A | Discharge status: Home / Self Care | Payer: Commercial Managed Care - HMO | Source: Ambulatory Visit | Attending: Cardiovascular Disease | Admitting: Cardiovascular Disease

## 2013-12-28 DIAGNOSIS — Z5189 Encounter for other specified aftercare: Secondary | ICD-10-CM | POA: Diagnosis not present

## 2013-12-31 ENCOUNTER — Encounter (HOSPITAL_COMMUNITY)
Admission: RE | Admit: 2013-12-31 | Discharge: 2013-12-31 | Disposition: A | Discharge status: Home / Self Care | Payer: Commercial Managed Care - HMO | Source: Ambulatory Visit | Attending: Cardiovascular Disease | Admitting: Cardiovascular Disease

## 2013-12-31 DIAGNOSIS — Z5189 Encounter for other specified aftercare: Secondary | ICD-10-CM | POA: Diagnosis not present

## 2013-12-31 NOTE — Progress Notes (Signed)
Cardiac Rehabilitation Program Outcomes Report   Orientation:  11/06/13 Graduate Date:  tbd Discharge Date:  tbd # of sessions completed: 18  Cardiologist: Gaynelle Cage MD:  Morton Amy Time:  0815  A.  Exercise Program:  Tolerates exercise @ 3.61 METS for 15 minutes  B.  Mental Health:  Good mental attitude  C.  Education/Instruction/Skills  Accurately checks own pulse.  Rest:  74  Exercise:  91 and Knows THR for exercise  Uses Perceived Exertion Scale and/or Dyspnea Scale  D.  Nutrition/Weight Control/Body Composition:  Adherence to prescribed nutrition program: good    E.  Blood Lipids    Lab Results  Component Value Date   CHOL 110 09/27/2013   HDL 42 09/27/2013   LDLCALC 57 09/27/2013   TRIG 54 09/27/2013   CHOLHDL 2.6 09/27/2013    F.  Lifestyle Changes:  Making positive lifestyle changes  G.  Symptoms noted with exercise:  Asymptomatic  Report Completed By:  Benay Pillow RN    Comments:  This is patients halfway note. Patient is progressing well.

## 2014-01-02 ENCOUNTER — Encounter (HOSPITAL_COMMUNITY)
Admission: RE | Admit: 2014-01-02 | Discharge: 2014-01-02 | Disposition: A | Discharge status: Home / Self Care | Payer: Commercial Managed Care - HMO | Source: Ambulatory Visit | Attending: Cardiovascular Disease | Admitting: Cardiovascular Disease

## 2014-01-02 DIAGNOSIS — Z5189 Encounter for other specified aftercare: Secondary | ICD-10-CM | POA: Diagnosis not present

## 2014-01-04 ENCOUNTER — Encounter (HOSPITAL_COMMUNITY): Payer: Commercial Managed Care - HMO

## 2014-01-07 ENCOUNTER — Encounter (HOSPITAL_COMMUNITY)
Admission: RE | Admit: 2014-01-07 | Discharge: 2014-01-07 | Disposition: A | Discharge status: Home / Self Care | Payer: Commercial Managed Care - HMO | Source: Ambulatory Visit | Attending: Cardiovascular Disease | Admitting: Cardiovascular Disease

## 2014-01-07 DIAGNOSIS — Z5189 Encounter for other specified aftercare: Secondary | ICD-10-CM | POA: Diagnosis not present

## 2014-01-09 ENCOUNTER — Encounter (HOSPITAL_COMMUNITY)
Admission: RE | Admit: 2014-01-09 | Discharge: 2014-01-09 | Disposition: A | Discharge status: Home / Self Care | Payer: Commercial Managed Care - HMO | Source: Ambulatory Visit | Attending: Cardiovascular Disease | Admitting: Cardiovascular Disease

## 2014-01-09 DIAGNOSIS — Z5189 Encounter for other specified aftercare: Secondary | ICD-10-CM | POA: Insufficient documentation

## 2014-01-09 DIAGNOSIS — I252 Old myocardial infarction: Secondary | ICD-10-CM | POA: Diagnosis not present

## 2014-01-11 ENCOUNTER — Encounter (HOSPITAL_COMMUNITY): Payer: Commercial Managed Care - HMO

## 2014-01-14 ENCOUNTER — Encounter (HOSPITAL_COMMUNITY)
Admission: RE | Admit: 2014-01-14 | Discharge: 2014-01-14 | Disposition: A | Discharge status: Home / Self Care | Payer: Commercial Managed Care - HMO | Source: Ambulatory Visit | Attending: Cardiovascular Disease | Admitting: Cardiovascular Disease

## 2014-01-14 ENCOUNTER — Ambulatory Visit: Payer: Medicare HMO | Admitting: Cardiovascular Disease

## 2014-01-14 DIAGNOSIS — Z5189 Encounter for other specified aftercare: Secondary | ICD-10-CM | POA: Diagnosis not present

## 2014-01-16 ENCOUNTER — Encounter (HOSPITAL_COMMUNITY)
Admission: RE | Admit: 2014-01-16 | Discharge: 2014-01-16 | Disposition: A | Discharge status: Home / Self Care | Payer: Commercial Managed Care - HMO | Source: Ambulatory Visit | Attending: Cardiovascular Disease | Admitting: Cardiovascular Disease

## 2014-01-16 DIAGNOSIS — Z5189 Encounter for other specified aftercare: Secondary | ICD-10-CM | POA: Diagnosis not present

## 2014-01-17 ENCOUNTER — Encounter (HOSPITAL_COMMUNITY): Payer: Self-pay | Admitting: Interventional Cardiology

## 2014-01-18 ENCOUNTER — Encounter (HOSPITAL_COMMUNITY)
Admission: RE | Admit: 2014-01-18 | Discharge: 2014-01-18 | Disposition: A | Discharge status: Home / Self Care | Payer: Commercial Managed Care - HMO | Source: Ambulatory Visit | Attending: Cardiovascular Disease | Admitting: Cardiovascular Disease

## 2014-01-18 DIAGNOSIS — Z5189 Encounter for other specified aftercare: Secondary | ICD-10-CM | POA: Diagnosis not present

## 2014-01-21 ENCOUNTER — Encounter (HOSPITAL_COMMUNITY)
Admission: RE | Admit: 2014-01-21 | Discharge: 2014-01-21 | Disposition: A | Discharge status: Home / Self Care | Payer: Commercial Managed Care - HMO | Source: Ambulatory Visit | Attending: Cardiovascular Disease | Admitting: Cardiovascular Disease

## 2014-01-21 DIAGNOSIS — Z5189 Encounter for other specified aftercare: Secondary | ICD-10-CM | POA: Diagnosis not present

## 2014-01-23 ENCOUNTER — Encounter (HOSPITAL_COMMUNITY)
Admission: RE | Admit: 2014-01-23 | Discharge: 2014-01-23 | Disposition: A | Discharge status: Home / Self Care | Payer: Commercial Managed Care - HMO | Source: Ambulatory Visit | Attending: Cardiovascular Disease | Admitting: Cardiovascular Disease

## 2014-01-23 DIAGNOSIS — Z5189 Encounter for other specified aftercare: Secondary | ICD-10-CM | POA: Diagnosis not present

## 2014-01-25 ENCOUNTER — Encounter (HOSPITAL_COMMUNITY)
Admission: RE | Admit: 2014-01-25 | Discharge: 2014-01-25 | Disposition: A | Discharge status: Home / Self Care | Payer: Commercial Managed Care - HMO | Source: Ambulatory Visit | Attending: Cardiovascular Disease | Admitting: Cardiovascular Disease

## 2014-01-25 DIAGNOSIS — Z5189 Encounter for other specified aftercare: Secondary | ICD-10-CM | POA: Diagnosis not present

## 2014-01-28 ENCOUNTER — Encounter (HOSPITAL_COMMUNITY)
Admission: RE | Admit: 2014-01-28 | Discharge: 2014-01-28 | Disposition: A | Discharge status: Home / Self Care | Payer: Commercial Managed Care - HMO | Source: Ambulatory Visit | Attending: Cardiovascular Disease | Admitting: Cardiovascular Disease

## 2014-01-28 DIAGNOSIS — Z5189 Encounter for other specified aftercare: Secondary | ICD-10-CM | POA: Diagnosis not present

## 2014-01-30 ENCOUNTER — Encounter (HOSPITAL_COMMUNITY)
Admission: RE | Admit: 2014-01-30 | Discharge: 2014-01-30 | Disposition: A | Discharge status: Home / Self Care | Payer: Commercial Managed Care - HMO | Source: Ambulatory Visit | Attending: Cardiovascular Disease | Admitting: Cardiovascular Disease

## 2014-01-30 DIAGNOSIS — Z5189 Encounter for other specified aftercare: Secondary | ICD-10-CM | POA: Diagnosis not present

## 2014-02-01 ENCOUNTER — Encounter (HOSPITAL_COMMUNITY): Payer: Commercial Managed Care - HMO

## 2014-02-03 ENCOUNTER — Encounter (HOSPITAL_COMMUNITY): Payer: Self-pay | Admitting: Emergency Medicine

## 2014-02-03 ENCOUNTER — Emergency Department (HOSPITAL_COMMUNITY)
Admission: EM | Admit: 2014-02-03 | Discharge: 2014-02-03 | Disposition: A | Discharge status: Home / Self Care | Payer: Commercial Managed Care - HMO | Attending: Emergency Medicine | Admitting: Emergency Medicine

## 2014-02-03 DIAGNOSIS — Z7902 Long term (current) use of antithrombotics/antiplatelets: Secondary | ICD-10-CM | POA: Insufficient documentation

## 2014-02-03 DIAGNOSIS — R51 Headache: Secondary | ICD-10-CM | POA: Diagnosis present

## 2014-02-03 DIAGNOSIS — Z7952 Long term (current) use of systemic steroids: Secondary | ICD-10-CM | POA: Insufficient documentation

## 2014-02-03 DIAGNOSIS — I252 Old myocardial infarction: Secondary | ICD-10-CM | POA: Diagnosis not present

## 2014-02-03 DIAGNOSIS — Z8669 Personal history of other diseases of the nervous system and sense organs: Secondary | ICD-10-CM | POA: Diagnosis not present

## 2014-02-03 DIAGNOSIS — E039 Hypothyroidism, unspecified: Secondary | ICD-10-CM | POA: Insufficient documentation

## 2014-02-03 DIAGNOSIS — F419 Anxiety disorder, unspecified: Secondary | ICD-10-CM | POA: Insufficient documentation

## 2014-02-03 DIAGNOSIS — I10 Essential (primary) hypertension: Secondary | ICD-10-CM | POA: Insufficient documentation

## 2014-02-03 DIAGNOSIS — J0100 Acute maxillary sinusitis, unspecified: Secondary | ICD-10-CM | POA: Insufficient documentation

## 2014-02-03 DIAGNOSIS — Z792 Long term (current) use of antibiotics: Secondary | ICD-10-CM | POA: Insufficient documentation

## 2014-02-03 MED ORDER — AMOXICILLIN-POT CLAVULANATE 875-125 MG PO TABS: 1.0000 | ORAL_TABLET | Freq: Two times a day (BID) | ORAL | Status: DC

## 2014-02-03 MED ORDER — HYDROCODONE-ACETAMINOPHEN 5-325 MG PO TABS: 1.0000 | ORAL_TABLET | Freq: Four times a day (QID) | ORAL | Status: DC | PRN

## 2014-02-03 MED ORDER — LORATADINE 10 MG PO TABS: 10.0000 mg | ORAL_TABLET | Freq: Every day | ORAL | Status: DC

## 2014-02-03 MED ORDER — HYDROCODONE-ACETAMINOPHEN 5-325 MG PO TABS
1.0000 | ORAL_TABLET | Freq: Once | ORAL | Status: AC
  Administered 2014-02-03: 1 via ORAL
  Filled 2014-02-03: qty 1

## 2014-02-03 NOTE — ED Notes (Signed)
PT states she has had sinus congestion and pressure x1 week and an unrelieved headache x2 days.

## 2014-02-03 NOTE — ED Provider Notes (Signed)
CSN: [id number]     Arrival date & time 02/03/14  6160 History   None    Chief Complaint  Patient presents with  . Headache     (Consider location/radiation/quality/duration/timing/severity/associated sxs/prior Treatment) Patient is a 68 y.o. female presenting with headaches. The history is provided by the patient.  Headache Pain location:  L temporal Quality:  Dull Radiates to:  L neck Severity currently:  10/10 Onset quality:  Gradual Duration:  2 days Timing:  Constant Progression:  Worsening Chronicity:  New Similar to prior headaches: yes   Relieved by:  Nothing Worsened by:  Sound and light Ineffective treatments:  None tried Associated symptoms: congestion, cough, ear pain, facial pain, fever, photophobia, sinus pressure, sore throat and URI   Associated symptoms: no abdominal pain, no back pain, no diarrhea, no pain, no myalgias, no nausea and no vomiting    [name] [name] is a 68 y.o. female who presents to the ED with sinus congestion and pressure feeling under her eyes x 1 week. The pain has gotten worse with the congestion. She has taken nothing for pain.   Past Medical History  Diagnosis Date  . Hypertension   . Anxiety   . Sleep apnea   . Hypothyroidism   . NSTEMI (non-ST elevated myocardial infarction)     a.  Related to occlusion of the distal Cx. The mid to distal obtuse marginal #1 also contains a significant stenosis that is best treated with medical therapy. Patent RCA/LAD with tortuosity. LM patent. EF 55%.   [name] History of echocardiogram     a. 2D ECHO: 09/21/2013; EF 55-60%; mild concentric hypertrophy, G1DD, mild LA dilation, PA pressure 37. Small RV pericardial effusion   Past Surgical History  Procedure Laterality Date  . Thyroidectomy  10/08/2011    Procedure: THYROIDECTOMY;  Surgeon: [name], MD;  Location: [facility];  Service: General;  Laterality: N/A;  Total  . Cardiac catheterization  ~ 2000  . Coronary angioplasty  09/20/2013  .  Vaginal hysterectomy  1993  . Left heart catheterization with coronary angiogram N/A 09/20/2013    Procedure: LEFT HEART CATHETERIZATION WITH CORONARY ANGIOGRAM;  Surgeon: Sinclair Grooms, MD;  Location: [facility] CATH LAB;  Service: Cardiovascular;  Laterality: N/A;  . Left heart catheterization with coronary angiogram N/A 09/27/2013    Procedure: LEFT HEART CATHETERIZATION WITH CORONARY ANGIOGRAM;  Surgeon: Blane Ohara, MD;  Location: [facility] CATH LAB;  Service: Cardiovascular;  Laterality: N/A;   No family history on file. History  Substance Use Topics  . Smoking status: Never Smoker   . Smokeless tobacco: Never Used  . Alcohol Use: No   OB History    Gravida Para Term Preterm AB TAB SAB Ectopic Multiple Living   2 2 2             Review of Systems  Constitutional: Positive for fever and chills.  HENT: Positive for congestion, ear pain, sinus pressure and sore throat.   Eyes: Positive for photophobia and itching. Negative for pain.  Respiratory: Positive for cough and wheezing. Negative for chest tightness and shortness of breath.   Cardiovascular: Negative for chest pain, palpitations and leg swelling.  Gastrointestinal: Negative for nausea, vomiting, abdominal pain, diarrhea and constipation.  Genitourinary: Negative for dysuria, urgency and frequency.  Musculoskeletal: Negative for myalgias, back pain and joint swelling.  Skin: Negative for rash.  Neurological: Positive for headaches. Negative for syncope.  Psychiatric/Behavioral: Negative for confusion. The patient is not nervous/anxious.  Allergies  Amlodipine; Lisinopril; and Ranexa  Home Medications   Prior to Admission medications   Medication Sig Start Date End Date Taking? Authorizing Provider  ALPRAZolam Duanne Moron) 0.5 MG tablet Take 0.5 mg by mouth as needed.     Historical Provider, MD  amoxicillin-clavulanate (AUGMENTIN) 875-125 MG per tablet Take 1 tablet by mouth 2 (two) times daily. 02/03/14   Hope Bunnie Pion, NP   Ascorbic Acid (VITAMIN C) 1000 MG tablet Take 1,000 mg by mouth daily.    Historical Provider, MD  aspirin 81 MG tablet Take 81 mg by mouth daily.    Historical Provider, MD  atorvastatin (LIPITOR) 80 MG tablet Take 80 mg by mouth daily. 09/22/13   Eileen Stanford, PA-C  chlorthalidone (HYGROTON) 25 MG tablet Take 1 tablet (25 mg total) by mouth daily. 12/05/13   Herminio Commons, MD  clopidogrel (PLAVIX) 75 MG tablet Take 75 mg by mouth daily.    Historical Provider, MD  HYDROcodone-acetaminophen (NORCO/VICODIN) 5-325 MG per tablet Take 1 tablet by mouth every 6 (six) hours as needed. 02/03/14   Hope Bunnie Pion, NP  levothyroxine (SYNTHROID, LEVOTHROID) 100 MCG tablet Take 100 mcg by mouth daily.    Historical Provider, MD  loratadine (CLARITIN) 10 MG tablet Take 1 tablet (10 mg total) by mouth daily. 02/03/14   Hope Bunnie Pion, NP  metoprolol succinate (TOPROL-XL) 50 MG 24 hr tablet Take 50 mg by mouth daily. Take with or immediately following a meal.    Historical Provider, MD  Multiple Vitamin (MULTIVITAMIN WITH MINERALS) TABS Take 1 tablet by mouth daily.    Historical Provider, MD  nitroGLYCERIN (NITROSTAT) 0.4 MG SL tablet Place 0.4 mg under the tongue every 5 (five) minutes x 3 doses as needed for chest pain. 09/22/13   Eileen Stanford, PA-C   BP 154/82 mmHg  Pulse 68  Temp(Src) 98 F (36.7 C) (Oral)  Resp 18  Ht 5\' 3"  (1.6 m)  Wt 194 lb (87.998 kg)  BMI 34.37 kg/m2  SpO2 98% Physical Exam  Constitutional: She is oriented to person, place, and time. She appears well-developed and well-nourished. No distress.  HENT:  Head: Normocephalic and atraumatic.  Right Ear: Tympanic membrane normal.  Left Ear: Tympanic membrane normal.  Nose: Mucosal edema and rhinorrhea present. Left sinus exhibits maxillary sinus tenderness.  Mouth/Throat: Uvula is midline, oropharynx is clear and moist and mucous membranes are normal.  Eyes: Conjunctivae and EOM are normal.  Neck: Normal range of  motion. Neck supple.  Cardiovascular: Normal rate and regular rhythm.   Pulmonary/Chest: Effort normal. She has no wheezes. She has no rales.  Abdominal: Soft. Bowel sounds are normal. She exhibits no mass. There is no tenderness.  Musculoskeletal: Normal range of motion. She exhibits no edema.  Radial and pedal pulses strong, adequate circulation, good touch sensation.  Neurological: She is alert and oriented to person, place, and time. She has normal strength. No cranial nerve deficit or sensory deficit. She displays a negative Romberg sign. Gait normal.  Reflex Scores:      Bicep reflexes are 2+ on the right side and 2+ on the left side.      Brachioradialis reflexes are 2+ on the right side and 2+ on the left side.      Patellar reflexes are 2+ on the right side and 2+ on the left side.      Achilles reflexes are 2+ on the right side and 2+ on the left side. Rapid alternating movement without  difficulty. Stands on one foot without difficulty.  Skin: Skin is warm and dry.  Psychiatric: She has a normal mood and affect. Her behavior is normal.  Nursing note and vitals reviewed.   ED Course  Procedures  MDM  68 y.o. female with sinus pressure and pain x 1 week. Will treat for sinusitis and pain. Stable for discharge without neuro deficits. Discussed with the patient and all questioned fully answered. She will return if any problems arise.    Medication List    TAKE these medications        amoxicillin-clavulanate 875-125 MG per tablet  Commonly known as:  AUGMENTIN  Take 1 tablet by mouth 2 (two) times daily.     HYDROcodone-acetaminophen 5-325 MG per tablet  Commonly known as:  NORCO/VICODIN  Take 1 tablet by mouth every 6 (six) hours as needed.     loratadine 10 MG tablet  Commonly known as:  CLARITIN  Take 1 tablet (10 mg total) by mouth daily.      ASK your doctor about these medications        ALPRAZolam 0.5 MG tablet  Commonly known as:  XANAX  Take 0.5 mg by mouth  as needed.     aspirin 81 MG tablet  Take 81 mg by mouth daily.     atorvastatin 80 MG tablet  Commonly known as:  LIPITOR  Take 80 mg by mouth daily.     chlorthalidone 25 MG tablet  Commonly known as:  HYGROTON  Take 1 tablet (25 mg total) by mouth daily.     clopidogrel 75 MG tablet  Commonly known as:  PLAVIX  Take 75 mg by mouth daily.     levothyroxine 100 MCG tablet  Commonly known as:  SYNTHROID, LEVOTHROID  Take 100 mcg by mouth daily.     metoprolol succinate 50 MG 24 hr tablet  Commonly known as:  TOPROL-XL  Take 50 mg by mouth daily. Take with or immediately following a meal.     multivitamin with minerals Tabs tablet  Take 1 tablet by mouth daily.     nitroGLYCERIN 0.4 MG SL tablet  Commonly known as:  NITROSTAT  Place 0.4 mg under the tongue every 5 (five) minutes x 3 doses as needed for chest pain.     vitamin C 1000 MG tablet  Take 1,000 mg by mouth daily.        Final diagnoses:  Acute maxillary sinusitis, recurrence not specified      Ashley Murrain, NP 02/03/14 [place], MD 02/03/14 [phone]

## 2014-02-04 ENCOUNTER — Encounter (HOSPITAL_COMMUNITY): Payer: Commercial Managed Care - HMO

## 2014-02-06 ENCOUNTER — Encounter (HOSPITAL_COMMUNITY): Payer: Commercial Managed Care - HMO

## 2014-02-08 ENCOUNTER — Encounter (HOSPITAL_COMMUNITY): Payer: Commercial Managed Care - HMO

## 2014-02-11 ENCOUNTER — Encounter (HOSPITAL_COMMUNITY): Payer: Commercial Managed Care - HMO

## 2014-02-13 ENCOUNTER — Encounter (HOSPITAL_COMMUNITY)
Admission: RE | Admit: 2014-02-13 | Discharge: 2014-02-13 | Disposition: A | Discharge status: Home / Self Care | Payer: Commercial Managed Care - HMO | Source: Ambulatory Visit | Attending: Cardiovascular Disease | Admitting: Cardiovascular Disease

## 2014-02-13 DIAGNOSIS — Z5189 Encounter for other specified aftercare: Secondary | ICD-10-CM | POA: Insufficient documentation

## 2014-02-13 DIAGNOSIS — I252 Old myocardial infarction: Secondary | ICD-10-CM | POA: Insufficient documentation

## 2014-02-15 ENCOUNTER — Encounter (HOSPITAL_COMMUNITY)
Admission: RE | Admit: 2014-02-15 | Discharge: 2014-02-15 | Disposition: A | Discharge status: Home / Self Care | Payer: Commercial Managed Care - HMO | Source: Ambulatory Visit | Attending: Cardiovascular Disease | Admitting: Cardiovascular Disease

## 2014-02-15 DIAGNOSIS — Z5189 Encounter for other specified aftercare: Secondary | ICD-10-CM | POA: Diagnosis not present

## 2014-02-15 DIAGNOSIS — I252 Old myocardial infarction: Secondary | ICD-10-CM | POA: Diagnosis not present

## 2014-02-18 ENCOUNTER — Encounter (HOSPITAL_COMMUNITY)
Admission: RE | Admit: 2014-02-18 | Discharge: 2014-02-18 | Disposition: A | Discharge status: Home / Self Care | Payer: Commercial Managed Care - HMO | Source: Ambulatory Visit | Attending: Cardiovascular Disease | Admitting: Cardiovascular Disease

## 2014-02-18 DIAGNOSIS — I252 Old myocardial infarction: Secondary | ICD-10-CM | POA: Diagnosis not present

## 2014-02-18 DIAGNOSIS — Z5189 Encounter for other specified aftercare: Secondary | ICD-10-CM | POA: Diagnosis not present

## 2014-02-20 DIAGNOSIS — Z79899 Other long term (current) drug therapy: Secondary | ICD-10-CM | POA: Diagnosis not present

## 2014-02-20 DIAGNOSIS — Z90710 Acquired absence of both cervix and uterus: Secondary | ICD-10-CM | POA: Diagnosis not present

## 2014-02-20 DIAGNOSIS — Z8249 Family history of ischemic heart disease and other diseases of the circulatory system: Secondary | ICD-10-CM | POA: Diagnosis not present

## 2014-02-20 DIAGNOSIS — I10 Essential (primary) hypertension: Secondary | ICD-10-CM | POA: Diagnosis not present

## 2014-02-20 DIAGNOSIS — E663 Overweight: Secondary | ICD-10-CM | POA: Diagnosis not present

## 2014-02-20 DIAGNOSIS — J00 Acute nasopharyngitis [common cold]: Secondary | ICD-10-CM | POA: Diagnosis not present

## 2014-02-22 ENCOUNTER — Encounter (HOSPITAL_COMMUNITY)
Admission: RE | Admit: 2014-02-22 | Discharge: 2014-02-22 | Disposition: A | Discharge status: Home / Self Care | Payer: Commercial Managed Care - HMO | Source: Ambulatory Visit | Attending: Cardiovascular Disease | Admitting: Cardiovascular Disease

## 2014-02-22 ENCOUNTER — Other Ambulatory Visit: Payer: Self-pay | Admitting: Physician Assistant

## 2014-02-22 DIAGNOSIS — Z5189 Encounter for other specified aftercare: Secondary | ICD-10-CM | POA: Diagnosis not present

## 2014-02-22 DIAGNOSIS — I252 Old myocardial infarction: Secondary | ICD-10-CM | POA: Diagnosis not present

## 2014-02-25 ENCOUNTER — Encounter (HOSPITAL_COMMUNITY): Payer: Commercial Managed Care - HMO

## 2014-02-27 ENCOUNTER — Encounter (HOSPITAL_COMMUNITY): Payer: Commercial Managed Care - HMO

## 2014-03-01 ENCOUNTER — Encounter (HOSPITAL_COMMUNITY): Payer: Commercial Managed Care - HMO

## 2014-03-04 ENCOUNTER — Encounter (HOSPITAL_COMMUNITY): Payer: Commercial Managed Care - HMO

## 2014-03-07 ENCOUNTER — Ambulatory Visit: Payer: Medicare HMO | Admitting: Cardiovascular Disease

## 2014-03-08 ENCOUNTER — Encounter (HOSPITAL_COMMUNITY)
Admission: RE | Admit: 2014-03-08 | Discharge: 2014-03-08 | Disposition: A | Discharge status: Home / Self Care | Payer: Commercial Managed Care - HMO | Source: Ambulatory Visit | Attending: Cardiovascular Disease | Admitting: Cardiovascular Disease

## 2014-03-08 DIAGNOSIS — Z5189 Encounter for other specified aftercare: Secondary | ICD-10-CM | POA: Diagnosis not present

## 2014-03-08 DIAGNOSIS — I252 Old myocardial infarction: Secondary | ICD-10-CM | POA: Diagnosis not present

## 2014-03-11 ENCOUNTER — Encounter (HOSPITAL_COMMUNITY)
Admission: RE | Admit: 2014-03-11 | Discharge: 2014-03-11 | Disposition: A | Discharge status: Home / Self Care | Payer: Commercial Managed Care - HMO | Source: Ambulatory Visit | Attending: Cardiovascular Disease | Admitting: Cardiovascular Disease

## 2014-03-11 DIAGNOSIS — I252 Old myocardial infarction: Secondary | ICD-10-CM | POA: Diagnosis not present

## 2014-03-11 DIAGNOSIS — Z5189 Encounter for other specified aftercare: Secondary | ICD-10-CM | POA: Diagnosis not present

## 2014-03-13 ENCOUNTER — Encounter (HOSPITAL_COMMUNITY)
Admission: RE | Admit: 2014-03-13 | Discharge: 2014-03-13 | Disposition: A | Discharge status: Home / Self Care | Payer: Commercial Managed Care - HMO | Source: Ambulatory Visit | Attending: Cardiovascular Disease | Admitting: Cardiovascular Disease

## 2014-03-13 DIAGNOSIS — I252 Old myocardial infarction: Secondary | ICD-10-CM | POA: Diagnosis not present

## 2014-03-13 DIAGNOSIS — Z5189 Encounter for other specified aftercare: Secondary | ICD-10-CM | POA: Diagnosis not present

## 2014-03-13 NOTE — Progress Notes (Signed)
Patient is discharged from [facility] program today, February 3rd, 2016 with 36 sessions.  She achieved LTG of 30 minutes of aerobic exercise at max met level of 3.60.  All patient vitals are WNL.  Patient has met with dietician.  Discharge instructions have been reviewed in detail and patient expressed an understanding of material given.  Patient plans to exercise at home.  Cardiac Rehab will make 1 month, 6 month and 1 year call backs.  Patient had no complaints of any abnormal S/S or pain on their exit visit.    

## 2014-03-13 NOTE — Progress Notes (Signed)
Cardiac Rehabilitation Program Outcomes Report   Orientation:  11/06/13 Graduate Date:  03/13/14 Discharge Date:  03/13/14 # of sessions completed: 36  Cardiologist: Bronson Ing Family MD:  Morton Amy Time:  0815  A.  Exercise Program:  Tolerates exercise @ 3.65 METS for 15 minutes and Walk Test Results:  Post: 3.16mets  B.  Mental Health:  Good mental attitude  C.  Education/Instruction/Skills  Accurately checks own pulse.  Rest:  70  Exercise:  87, Knows THR for exercise, Uses Perceived Exertion Scale and/or Dyspnea Scale and Attended all education classes  Anticipated home compliance with respiratory program:  good  D.  Nutrition/Weight Control/Body Composition:  Adherence to prescribed nutrition program: good    E.  Blood Lipids    Lab Results  Component Value Date   CHOL 110 09/27/2013   HDL 42 09/27/2013   LDLCALC 57 09/27/2013   TRIG 54 09/27/2013   CHOLHDL 2.6 09/27/2013    F.  Lifestyle Changes:  Making positive lifestyle changes  G.  Symptoms noted with exercise:  Asymptomatic  Report Completed By:  Stevphen Rochester RN    Comments:  This is patients graduation note. Patient has done well in program. Plans to exercise at home.

## 2014-03-15 ENCOUNTER — Encounter (HOSPITAL_COMMUNITY): Payer: Commercial Managed Care - HMO

## 2014-03-18 ENCOUNTER — Encounter (HOSPITAL_COMMUNITY): Payer: Commercial Managed Care - HMO

## 2014-03-18 ENCOUNTER — Emergency Department (HOSPITAL_COMMUNITY): Payer: Commercial Managed Care - HMO

## 2014-03-18 ENCOUNTER — Emergency Department (HOSPITAL_COMMUNITY)
Admission: EM | Admit: 2014-03-18 | Discharge: 2014-03-18 | Disposition: A | Discharge status: Home / Self Care | Payer: Commercial Managed Care - HMO | Attending: Emergency Medicine | Admitting: Emergency Medicine

## 2014-03-18 ENCOUNTER — Encounter (HOSPITAL_COMMUNITY): Payer: Self-pay | Admitting: Emergency Medicine

## 2014-03-18 DIAGNOSIS — M545 Low back pain: Secondary | ICD-10-CM | POA: Insufficient documentation

## 2014-03-18 DIAGNOSIS — R918 Other nonspecific abnormal finding of lung field: Secondary | ICD-10-CM | POA: Diagnosis not present

## 2014-03-18 DIAGNOSIS — M549 Dorsalgia, unspecified: Secondary | ICD-10-CM | POA: Diagnosis not present

## 2014-03-18 DIAGNOSIS — M25552 Pain in left hip: Secondary | ICD-10-CM | POA: Diagnosis not present

## 2014-03-18 DIAGNOSIS — I10 Essential (primary) hypertension: Secondary | ICD-10-CM | POA: Insufficient documentation

## 2014-03-18 DIAGNOSIS — Z8669 Personal history of other diseases of the nervous system and sense organs: Secondary | ICD-10-CM | POA: Insufficient documentation

## 2014-03-18 DIAGNOSIS — I252 Old myocardial infarction: Secondary | ICD-10-CM | POA: Diagnosis not present

## 2014-03-18 DIAGNOSIS — E039 Hypothyroidism, unspecified: Secondary | ICD-10-CM | POA: Insufficient documentation

## 2014-03-18 DIAGNOSIS — Z79899 Other long term (current) drug therapy: Secondary | ICD-10-CM | POA: Diagnosis not present

## 2014-03-18 DIAGNOSIS — R0789 Other chest pain: Secondary | ICD-10-CM | POA: Diagnosis not present

## 2014-03-18 DIAGNOSIS — Z7982 Long term (current) use of aspirin: Secondary | ICD-10-CM | POA: Diagnosis not present

## 2014-03-18 DIAGNOSIS — M25559 Pain in unspecified hip: Secondary | ICD-10-CM

## 2014-03-18 DIAGNOSIS — R1031 Right lower quadrant pain: Secondary | ICD-10-CM | POA: Diagnosis not present

## 2014-03-18 DIAGNOSIS — I517 Cardiomegaly: Secondary | ICD-10-CM | POA: Diagnosis not present

## 2014-03-18 LAB — BASIC METABOLIC PANEL
Anion gap: 4 — ABNORMAL LOW (ref 5–15)
BUN: 16 mg/dL (ref 6–23)
CO2: 29 mmol/L (ref 19–32)
Calcium: 9 mg/dL (ref 8.4–10.5)
Chloride: 107 mmol/L (ref 96–112)
Creatinine, Ser: 0.94 mg/dL (ref 0.50–1.10)
GFR calc Af Amer: 71 mL/min — ABNORMAL LOW (ref >90)
GFR calc non Af Amer: 61 mL/min — ABNORMAL LOW (ref >90)
Glucose, Bld: 83 mg/dL (ref 70–99)
Potassium: 3.8 mmol/L (ref 3.5–5.1)
Sodium: 140 mmol/L (ref 135–145)

## 2014-03-18 LAB — CBC WITH DIFFERENTIAL/PLATELET
BASOS PCT: 0 % (ref 0–1)
Basophils Absolute: 0 10*3/uL (ref 0.0–0.1)
EOS PCT: 6 % — AB (ref 0–5)
Eosinophils Absolute: 0.3 10*3/uL (ref 0.0–0.7)
HCT: 43.7 % (ref 36.0–46.0)
HEMOGLOBIN: 14.4 g/dL (ref 12.0–15.0)
LYMPHS ABS: 1.7 10*3/uL (ref 0.7–4.0)
Lymphocytes Relative: 29 % (ref 12–46)
MCH: 30.9 pg (ref 26.0–34.0)
MCHC: 33 g/dL (ref 30.0–36.0)
MCV: 93.8 fL (ref 78.0–100.0)
Monocytes Absolute: 0.3 10*3/uL (ref 0.1–1.0)
Monocytes Relative: 6 % (ref 3–12)
NEUTROS ABS: 3.5 10*3/uL (ref 1.7–7.7)
NEUTROS PCT: 59 % (ref 43–77)
Platelets: 425 10*3/uL — ABNORMAL HIGH (ref 150–400)
RBC: 4.66 MIL/uL (ref 3.87–5.11)
RDW: 15.6 % — ABNORMAL HIGH (ref 11.5–15.5)
WBC: 5.9 10*3/uL (ref 4.0–10.5)

## 2014-03-18 LAB — D-DIMER, QUANTITATIVE (NOT AT ARMC): D-Dimer, Quant: 1.05 ug/mL-FEU — ABNORMAL HIGH (ref 0.00–0.48)

## 2014-03-18 MED ORDER — HYDROCODONE-ACETAMINOPHEN 5-325 MG PO TABS: 1.0000 | ORAL_TABLET | Freq: Four times a day (QID) | ORAL | Status: DC | PRN

## 2014-03-18 MED ORDER — SODIUM CHLORIDE 0.9 % IV SOLN
INTRAVENOUS | Status: DC
  Administered 2014-03-18: 13:00:00 via INTRAVENOUS

## 2014-03-18 MED ORDER — HYDROCODONE-ACETAMINOPHEN 5-325 MG PO TABS
1.0000 | ORAL_TABLET | Freq: Once | ORAL | Status: AC
  Administered 2014-03-18: 1 via ORAL
  Filled 2014-03-18: qty 1

## 2014-03-18 MED ORDER — IOHEXOL 350 MG/ML SOLN
100.0000 mL | Freq: Once | INTRAVENOUS | Status: AC | PRN
  Administered 2014-03-18: 100 mL via INTRAVENOUS

## 2014-03-18 NOTE — ED Provider Notes (Signed)
CSN: [id number]     Arrival date & time 03/18/14  0755 History  This chart was scribed for [name] Sorrow, MD by Stephania Fragmin, ED Scribe. This patient was seen in room APA19/APA19 and the patient's care was started at 8:41 AM.   Chief Complaint  Patient presents with  . Back Pain   Patient is a 69 y.o. female presenting with back pain. The history is provided by the patient. No language interpreter was used.  Back Pain Pain location: left lower back and groin. Pain severity:  Moderate Onset quality:  Sudden Duration:  6 hours Timing:  Constant Chronicity:  New Context comment:  Getting up from the toilet Relieved by:  None tried Worsened by:  Ambulation Ineffective treatments:  None tried Associated symptoms: no abdominal pain, no chest pain, no dysuria, no fever and no headaches      HPI Comments: [name] [name] is a 69 y.o. female who presents to the Emergency Department complaining of sudden onset, left sided lower back pain and right sided groin pain that began 5 hours ago, when she was sitting on the toilet and suddenly felt she was unable to stand up. She states she was last well when she went to bed. This is a new problem. Ambulating exacerbates the pain to a level of 10/10. At rest, she would rate it 4/10. She denies taking any medications for the pain prior to arrival. She also notes having congestion for a while, and she was seen at the ED here for a possible sinus infection 1 month ago that hasn't resolved. She denies abdominal pain, fever, chills, visual disturbances, cough, chest pain, SOB, nausea, vomiting, diarrhea, dysuria, leg swelling, rash, bleeding easily, neck pain, or headache. PCP Dr. Legrand Rams    Past Medical History  Diagnosis Date  . Hypertension   . Anxiety   . Sleep apnea   . Hypothyroidism   . NSTEMI (non-ST elevated myocardial infarction)     a.  Related to occlusion of the distal Cx. The mid to distal obtuse marginal #1 also contains a significant stenosis  that is best treated with medical therapy. Patent RCA/LAD with tortuosity. LM patent. EF 55%.   [name] History of echocardiogram     a. 2D ECHO: 09/21/2013; EF 55-60%; mild concentric hypertrophy, G1DD, mild LA dilation, PA pressure 37. Small RV pericardial effusion   Past Surgical History  Procedure Laterality Date  . Thyroidectomy  10/08/2011    Procedure: THYROIDECTOMY;  Surgeon: [name], MD;  Location: [facility];  Service: General;  Laterality: N/A;  Total  . Cardiac catheterization  ~ 2000  . Coronary angioplasty  09/20/2013  . Vaginal hysterectomy  1993  . Left heart catheterization with coronary angiogram N/A 09/20/2013    Procedure: LEFT HEART CATHETERIZATION WITH CORONARY ANGIOGRAM;  Surgeon: Sinclair Grooms, MD;  Location: [facility] CATH LAB;  Service: Cardiovascular;  Laterality: N/A;  . Left heart catheterization with coronary angiogram N/A 09/27/2013    Procedure: LEFT HEART CATHETERIZATION WITH CORONARY ANGIOGRAM;  Surgeon: Blane Ohara, MD;  Location: [facility] CATH LAB;  Service: Cardiovascular;  Laterality: N/A;   No family history on file. History  Substance Use Topics  . Smoking status: Never Smoker   . Smokeless tobacco: Never Used  . Alcohol Use: No   OB History    Gravida Para Term Preterm AB TAB SAB Ectopic Multiple Living   2 2 2             Review of Systems  Constitutional: Negative for fever and chills.  HENT: Positive for congestion. Negative for sore throat.   Eyes: Negative for visual disturbance.  Respiratory: Negative for cough and shortness of breath.   Cardiovascular: Negative for chest pain and leg swelling.  Gastrointestinal: Negative for nausea, vomiting, abdominal pain and diarrhea.  Genitourinary: Negative for dysuria.  Musculoskeletal: Positive for back pain.  Skin: Negative for rash.  Neurological: Negative for headaches.  Hematological: Does not bruise/bleed easily.      Allergies  Amlodipine; Lisinopril; and Ranexa  Home Medications    Prior to Admission medications   Medication Sig Start Date End Date Taking? Authorizing Provider  ALPRAZolam Duanne Moron) 0.5 MG tablet Take 0.5 mg by mouth as needed for anxiety.    Yes Historical Provider, MD  Ascorbic Acid (VITAMIN C) 1000 MG tablet Take 1,000 mg by mouth daily.   Yes Historical Provider, MD  atorvastatin (LIPITOR) 80 MG tablet Take 80 mg by mouth daily. 09/22/13  Yes Eileen Stanford, PA-C  chlorthalidone (HYGROTON) 25 MG tablet Take 1 tablet (25 mg total) by mouth daily. 12/05/13  Yes Herminio Commons, MD  HYDROcodone-acetaminophen (NORCO/VICODIN) 5-325 MG per tablet Take 1 tablet by mouth every 6 (six) hours as needed. Patient taking differently: Take 1 tablet by mouth every 6 (six) hours as needed for moderate pain.  02/03/14  Yes Hope Bunnie Pion, NP  levothyroxine (SYNTHROID, LEVOTHROID) 100 MCG tablet Take 100 mcg by mouth daily.   Yes Historical Provider, MD  metoprolol succinate (TOPROL-XL) 50 MG 24 hr tablet Take 50 mg by mouth daily. Take with or immediately following a meal.   Yes Historical Provider, MD  Multiple Vitamin (MULTIVITAMIN WITH MINERALS) TABS Take 1 tablet by mouth daily.   Yes Historical Provider, MD  nitroGLYCERIN (NITROSTAT) 0.4 MG SL tablet Place 0.4 mg under the tongue every 5 (five) minutes x 3 doses as needed for chest pain. 09/22/13  Yes Eileen Stanford, PA-C  aspirin 81 MG tablet Take 81 mg by mouth daily as needed (heart).     Historical Provider, MD  HYDROcodone-acetaminophen (NORCO/VICODIN) 5-325 MG per tablet Take 1-2 tablets by mouth every 6 (six) hours as needed. 03/18/14   [name] Sorrow, MD  loratadine (CLARITIN) 10 MG tablet Take 1 tablet (10 mg total) by mouth daily. Patient not taking: Reported on 03/13/2014 02/03/14   Ashley Murrain, NP   BP 145/95 mmHg  Pulse 58  Temp(Src) 98.5 F (36.9 C)  Resp 16  Ht 5\' 3"  (1.6 m)  Wt 190 lb (86.183 kg)  BMI 33.67 kg/m2  SpO2 98% Physical Exam  Constitutional: She is oriented to person,  place, and time. She appears well-developed and well-nourished. No distress.  HENT:  Head: Normocephalic and atraumatic.  Mouth/Throat: Oropharynx is clear and moist.  Eyes: Conjunctivae and EOM are normal. Pupils are equal, round, and reactive to light. No scleral icterus.  Neck: Neck supple. No tracheal deviation present.  Cardiovascular: Normal rate and regular rhythm.   Pulmonary/Chest: Effort normal. No respiratory distress.  Abdominal: Soft. Bowel sounds are normal. There is no tenderness.  Musculoskeletal: Normal range of motion. She exhibits tenderness. She exhibits no edema (No ankle swelling.).  Tenderness to posterior left rib margin and inner thigh area. No tenderness over the lumbar area.   Neurological: She is alert and oriented to person, place, and time.  Skin: Skin is warm and dry.  Psychiatric: She has a normal mood and affect. Her behavior is normal.  Nursing note and vitals  reviewed.   ED Course  Procedures (including critical care time)  DIAGNOSTIC STUDIES: Oxygen Saturation is 97% on room air, normal by my interpretation.    COORDINATION OF CARE: 8:48 AM - Discussed treatment plan with pt at bedside which includes CXR and [place], and pt agreed to plan.   Results for orders placed or performed during the hospital encounter of 03/18/14  D-dimer, quantitative  Result Value Ref Range   D-Dimer, Quant 1.05 (H) 0.00 - 0.48 ug/mL-FEU  CBC with Differential/Platelet  Result Value Ref Range   WBC 5.9 4.0 - 10.5 K/uL   RBC 4.66 3.87 - 5.11 MIL/uL   Hemoglobin 14.4 12.0 - 15.0 g/dL   HCT 43.7 36.0 - 46.0 %   MCV 93.8 78.0 - 100.0 fL   MCH 30.9 26.0 - 34.0 pg   MCHC 33.0 30.0 - 36.0 g/dL   RDW 15.6 (H) 11.5 - 15.5 %   Platelets 425 (H) 150 - 400 K/uL   Neutrophils Relative % 59 43 - 77 %   Neutro Abs 3.5 1.7 - 7.7 K/uL   Lymphocytes Relative 29 12 - 46 %   Lymphs Abs 1.7 0.7 - 4.0 K/uL   Monocytes Relative 6 3 - 12 %   Monocytes Absolute 0.3 0.1 - 1.0 K/uL    Eosinophils Relative 6 (H) 0 - 5 %   Eosinophils Absolute 0.3 0.0 - 0.7 K/uL   Basophils Relative 0 0 - 1 %   Basophils Absolute 0.0 0.0 - 0.1 K/uL  Basic metabolic panel  Result Value Ref Range   Sodium 140 135 - 145 mmol/L   Potassium 3.8 3.5 - 5.1 mmol/L   Chloride 107 96 - 112 mmol/L   CO2 29 19 - 32 mmol/L   Glucose, Bld 83 70 - 99 mg/dL   BUN 16 6 - 23 mg/dL   Creatinine, Ser 0.94 0.50 - 1.10 mg/dL   Calcium 9.0 8.4 - 10.5 mg/dL   GFR calc non Af Amer 61 (L) >90 mL/min   GFR calc Af Amer 71 (L) >90 mL/min   Anion gap 4 (L) 5 - 15   Dg Chest 2 View  03/18/2014   CLINICAL DATA:  Left side lower chest pain.  EXAM: CHEST  2 VIEW  COMPARISON:  09/26/2013  FINDINGS: Tortuosity of the thoracic aorta. Heart is normal size. Lungs are clear. No effusions. No acute bony abnormality. No pneumothorax.  IMPRESSION: No active cardiopulmonary disease.   Electronically Signed   By: Rolm Baptise M.D.   On: 03/18/2014 09:30   Ct Angio Chest Pe W/cm &/or Wo Cm  03/18/2014   CLINICAL DATA:  Left and mid back pain since 2:30 a.m. the night of 03/17/2014.  EXAM: CT ANGIOGRAPHY CHEST WITH CONTRAST  TECHNIQUE: Multidetector CT imaging of the chest was performed using the standard protocol during bolus administration of intravenous contrast. Multiplanar CT image reconstructions and MIPs were obtained to evaluate the vascular anatomy.  CONTRAST:  100 mL OMNIPAQUE IOHEXOL 350 MG/ML SOLN  COMPARISON:  CT chest 09/20/2013 and 02/17/2009.  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 No pulmonary embolus is identified. There is cardiomegaly. No pleural or pericardial effusion. Very small hiatal hernia is seen. Mild, scattered aortic atherosclerosis is seen. No calcific coronary artery disease is identified. Two subcentimeter nodules in the right middle lobe are stable in appearance on images 52 and 54. Tiny subpleural nodule in the right upper lobe is stable on image 49. Right lower lobe nodular  opacity  is stable on image 52. No new or enlarging pulmonary nodule is identified.  Visualized upper abdomen demonstrates no focal abnormality. No focal bony abnormality is seen.  Review of the MIP images confirms the above findings.  IMPRESSION: Negative for pulmonary embolus or acute disease.  Cardiomegaly.  Small pulmonary nodules are stable since 2011 consistent with difficulty.  Small hiatal hernia.   Electronically Signed   By: Inge Rise M.D.   On: 03/18/2014 15:30   US Venous Img Lower Unilateral Right  03/18/2014   CLINICAL DATA:  69 year old female with a right groin and back pain.  EXAM: RIGHT LOWER EXTREMITY VENOUS DOPPLER ULTRASOUND  TECHNIQUE: Gray-scale sonography with graded compression, as well as color Doppler and duplex ultrasound were performed to evaluate the lower extremity deep venous systems from the level of the common femoral vein and including the common femoral, femoral, profunda femoral, popliteal and calf veins including the posterior tibial, peroneal and gastrocnemius veins when visible. The superficial great saphenous vein was also interrogated. Spectral Doppler was utilized to evaluate flow at rest and with distal augmentation maneuvers in the common femoral, femoral and popliteal veins.  COMPARISON:  None.  FINDINGS: Contralateral Common Femoral Vein: Respiratory phasicity is normal and symmetric with the symptomatic side. No evidence of thrombus. Normal compressibility.  Common Femoral Vein: No evidence of thrombus. Normal compressibility, respiratory phasicity and response to augmentation.  Saphenofemoral Junction: No evidence of thrombus. Normal compressibility and flow on color Doppler imaging.  Profunda Femoral Vein: No evidence of thrombus. Normal compressibility and flow on color Doppler imaging.  Femoral Vein: No evidence of thrombus. Normal compressibility, respiratory phasicity and response to augmentation.  Popliteal Vein: No evidence of thrombus. Normal  compressibility, respiratory phasicity and response to augmentation.  Calf Veins: No evidence of thrombus. Normal compressibility and flow on color Doppler imaging.  Superficial Great Saphenous Vein: No evidence of thrombus. Normal compressibility and flow on color Doppler imaging.  Venous Reflux:  None.  Other Findings:  None.  IMPRESSION: No evidence of deep venous thrombosis.   Electronically Signed   By: Jacqulynn Cadet M.D.   On: 03/18/2014 11:12   Dg Hips Bilat With Pelvis Min 5 Views  03/18/2014   CLINICAL DATA:  Left hip and right groin pain since a fall last night. Initial encounter.  EXAM: BILATERAL HIP (WITH PELVIS) 5-6 VIEWS  COMPARISON:  None.  FINDINGS: No acute bony or joint abnormality is identified. No notable degenerative change is seen about the hips. Small calcification off the greater trochanter is consistent with chronic change in a gluteal tendon.  IMPRESSION: No acute abnormality.   Electronically Signed   By: Inge Rise M.D.   On: 03/18/2014 09:35   Medications  0.9 %  sodium chloride infusion ( Intravenous New Bag/Given 03/18/14 1315)  HYDROcodone-acetaminophen (NORCO/VICODIN) 5-325 MG per tablet 1 tablet (not administered)  iohexol (OMNIPAQUE) 350 MG/ML injection 100 mL (100 mLs Intravenous Contrast Given 03/18/14 1503)     1:06 PM-Informed patient of elevated D-Dimer results. Discussed treatment plan which includes CT scan to screen for PE with pt at bedside, and pt agreed to plan.     EKG Interpretation None      MDM   Final diagnoses:  Hip pain  Back pain   Patient with extensive workup of right thigh pain and left posterior back pain along the rib margin. No evidence of pulmonary embolus no evidence of deep vein thrombosis. Symptoms must be consistent with musculoskeletal. Patient be treated with hydrocodone  and follow-up with her doctor. No anterior chest pain.  I personally performed the services described in this documentation, which was scribed in my  presence. The recorded information has been reviewed and is accurate.      [name] Sorrow, MD 03/18/14 [id number]

## 2014-03-18 NOTE — ED Notes (Signed)
pt c/o left mid back pain and thigh pain since 0230 this am. Denies injury. Denies gi/gu sx.

## 2014-03-18 NOTE — Discharge Instructions (Signed)
Extensive workup here essentially geared at ruling out a pulmonary embolus or deep vein thrombosis as the cause of the pain is negative. Take the pain medicine as directed. Follow-up with your doctor. Rest. Return for any new or worse symptoms.

## 2014-03-18 NOTE — ED Notes (Signed)
Ultrasound at bedside

## 2014-03-25 ENCOUNTER — Other Ambulatory Visit: Payer: Self-pay | Admitting: Cardiovascular Disease

## 2014-03-25 ENCOUNTER — Ambulatory Visit: Payer: Commercial Managed Care - HMO | Admitting: Cardiovascular Disease

## 2014-04-02 DIAGNOSIS — J310 Chronic rhinitis: Secondary | ICD-10-CM | POA: Diagnosis not present

## 2014-04-02 DIAGNOSIS — R51 Headache: Secondary | ICD-10-CM | POA: Diagnosis not present

## 2014-04-22 DIAGNOSIS — H5210 Myopia, unspecified eye: Secondary | ICD-10-CM | POA: Diagnosis not present

## 2014-04-22 DIAGNOSIS — H524 Presbyopia: Secondary | ICD-10-CM | POA: Diagnosis not present

## 2014-04-25 ENCOUNTER — Other Ambulatory Visit: Payer: Self-pay | Admitting: Physician Assistant

## 2014-05-06 ENCOUNTER — Ambulatory Visit: Payer: Commercial Managed Care - HMO | Admitting: Cardiovascular Disease

## 2014-06-03 ENCOUNTER — Encounter: Payer: Self-pay | Admitting: Cardiovascular Disease

## 2014-06-03 ENCOUNTER — Ambulatory Visit (INDEPENDENT_AMBULATORY_CARE_PROVIDER_SITE_OTHER): Payer: Commercial Managed Care - HMO | Admitting: Cardiovascular Disease

## 2014-06-03 VITALS — BP 139/91 | HR 68 | Ht 63.0 in | Wt 202.0 lb

## 2014-06-03 DIAGNOSIS — I10 Essential (primary) hypertension: Secondary | ICD-10-CM | POA: Diagnosis not present

## 2014-06-03 DIAGNOSIS — E785 Hyperlipidemia, unspecified: Secondary | ICD-10-CM | POA: Diagnosis not present

## 2014-06-03 DIAGNOSIS — G4733 Obstructive sleep apnea (adult) (pediatric): Secondary | ICD-10-CM

## 2014-06-03 DIAGNOSIS — R7309 Other abnormal glucose: Secondary | ICD-10-CM

## 2014-06-03 DIAGNOSIS — R7303 Prediabetes: Secondary | ICD-10-CM

## 2014-06-03 DIAGNOSIS — I25119 Atherosclerotic heart disease of native coronary artery with unspecified angina pectoris: Secondary | ICD-10-CM

## 2014-06-03 MED ORDER — NITROGLYCERIN 0.4 MG SL SUBL: 0.4000 mg | SUBLINGUAL_TABLET | SUBLINGUAL | Status: DC | PRN

## 2014-06-03 NOTE — Patient Instructions (Addendum)
   Lab for Lipids -  Due in Augutst - will mail reminder.  Nitroglycerin refill sent to [facility] today.  Continue all current medications. Your physician wants you to follow up in: 6 months.  You will receive a reminder letter in the mail one-two months in advance.  If you don't receive a letter, please call our office to schedule the follow up appointment

## 2014-06-03 NOTE — Progress Notes (Signed)
Patient ID: [name] [name], female   DOB: 10-Nov-1945, 69 y.o.   MRN: [id number]      SUBJECTIVE: The patient presents for routine follow up. She has a history of CAD. She was admitted 09/20/13 to 09/22/13 with chest pain and sustained a NSTEMI. Cath 09/20/13 showed distally occluded OM1 60-80%. It was treated medically at that time and she was discharged home. She was readmitted with chest pain a few days later, and repeat cath on 09/27/13 showed subtotal occlusion of a medium caliber highly tortuous OM worse from prior study, described as unfavorable for PCI, though if refractory angina PCI could be considered per notes.  09/21/13 Echo LVEF 55-60%.  PMH also includes HTN, hyperlipidemia, pre-diabetes mellitus, and OSA .  She has previously been unable to tolerate Ranexa, Imdur, amlodipine, and lisinopril.  She completed cardiac rehabilitation. She denies chest pain and shortness of breath. She walks 3 days per week and roughly 7 miles total in a week. She is no longer taking Plavix. Her systolic blood pressure runs 120-130 mmHg at home.   She told me about her daughter who is studying sociology and business in [place] for 6 months. 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    Review of Systems: As per "subjective", otherwise negative.  Allergies  Allergen Reactions  . Amlodipine     Lip swelling per patient.   . Lisinopril     Dr. Legrand Rams stopped due to side effects per patient.  Could remember what side effects were.    [name] [Ranolazine]     Chest pain    Current Outpatient Prescriptions  Medication Sig Dispense Refill  . ALPRAZolam (XANAX) 0.5 MG tablet Take 0.5 mg by mouth as needed for anxiety.     . Ascorbic Acid (VITAMIN C) 1000 MG tablet Take 1,000 mg by mouth daily.    [name] aspirin 81 MG tablet Take 81 mg by mouth daily as needed (heart).     . chlorthalidone (HYGROTON) 25 MG tablet TAKE 1 TABLET BY MOUTH DAILY 30 tablet 3  . levothyroxine (SYNTHROID, LEVOTHROID)  100 MCG tablet Take 100 mcg by mouth daily.    . metoprolol succinate (TOPROL-XL) 50 MG 24 hr tablet TAKE 1 TABLET BY MOUTH DAILY OR IMMEDIATELY FOLLOWING A MEAL 30 tablet 4  . Multiple Vitamin (MULTIVITAMIN WITH MINERALS) TABS Take 1 tablet by mouth daily.    . nitroGLYCERIN (NITROSTAT) 0.4 MG SL tablet Place 0.4 mg under the tongue every 5 (five) minutes x 3 doses as needed for chest pain.    [name] atorvastatin (LIPITOR) 80 MG tablet Take 80 mg by mouth daily.    [name] loratadine (CLARITIN) 10 MG tablet Take 1 tablet (10 mg total) by mouth daily. (Patient not taking: Reported on 03/13/2014) 14 tablet 0   No current facility-administered medications for this visit.    Past Medical History  Diagnosis Date  . Hypertension   . Anxiety   . Sleep apnea   . Hypothyroidism   . NSTEMI (non-ST elevated myocardial infarction)     a.  Related to occlusion of the distal Cx. The mid to distal obtuse marginal #1 also contains a significant stenosis that is best treated with medical therapy. Patent RCA/LAD with tortuosity. LM patent. EF 55%.   [name] History of echocardiogram     a. 2D ECHO: 09/21/2013; EF 55-60%; mild concentric hypertrophy, G1DD, mild LA dilation, PA pressure 37. Small RV pericardial effusion    Past Surgical History  Procedure Laterality Date  . Thyroidectomy  10/08/2011    Procedure: THYROIDECTOMY;  Surgeon: [name], MD;  Location: [facility];  Service: General;  Laterality: N/A;  Total  . Cardiac catheterization  ~ 2000  . Coronary angioplasty  09/20/2013  . Vaginal hysterectomy  1993  . Left heart catheterization with coronary angiogram N/A 09/20/2013    Procedure: LEFT HEART CATHETERIZATION WITH CORONARY ANGIOGRAM;  Surgeon: Sinclair Grooms, MD;  Location: [facility] CATH LAB;  Service: Cardiovascular;  Laterality: N/A;  . Left heart catheterization with coronary angiogram N/A 09/27/2013    Procedure: LEFT HEART CATHETERIZATION WITH CORONARY ANGIOGRAM;  Surgeon: Blane Ohara, MD;  Location:  [facility] CATH LAB;  Service: Cardiovascular;  Laterality: N/A;    History   Social History  . Marital Status: Widowed    Spouse Name: N/A  . Number of Children: N/A  . Years of Education: N/A   Occupational History  . Not on file.   Social History Main Topics  . Smoking status: Never Smoker   . Smokeless tobacco: Never Used  . Alcohol Use: No  . Drug Use: No  . Sexual Activity: No   Other Topics Concern  . Not on file   Social History Narrative     Filed Vitals:   06/03/14 0929  BP: 139/91  Pulse: 68  Height: 5\' 3"  (1.6 m)  Weight: 202 lb (91.627 kg)    PHYSICAL EXAM General: NAD HEENT: Normal. Neck: No JVD, no thyromegaly. Lungs: Clear to auscultation bilaterally with normal respiratory effort. CV: Nondisplaced PMI.  Regular rate and rhythm, normal S1/S2, no S3/S4, no murmur. No pretibial or periankle edema.  No carotid bruit.  Normal pedal pulses.  Abdomen: Soft, nontender, no hepatosplenomegaly, no distention.  Neurologic: Alert and oriented x 3.  Psych: Normal affect. Skin: Normal. Musculoskeletal: Normal range of motion, no gross deformities. Extremities: No clubbing or cyanosis.   ECG: Most recent ECG reviewed.      ASSESSMENT AND PLAN: 1. CAD: Stable ischemic heart disease. Continue aspirin 81 mg, Lipitor 80 mg, and Toprol-XL 50 mg. Ranexa caused her to have chest pain so she stopped taking it. Has been unable to tolerate Imdur and amlodipine as well. Stopped taking Plavix for unclear reasons.  2. Essential HTN: Reasonably controlled today and normal at home. Both amlodipine and lisinopril caused lip swelling. I will continue chlorthalidone 25 mg daily.  3. Hyperlipidemia: On high dose Lipitor. Will check lipids in August 2016.   4. Pre-type 2 diabetes: HbA1C 6.2%. Extensive dietary and exercise counseling provided at prior visit.   5. OSA: Treatment deferred to PCP.  Dispo: f/u 6 months.   Kate Sable, M.D., F.A.C.C.

## 2014-06-05 ENCOUNTER — Emergency Department (HOSPITAL_COMMUNITY)
Admission: EM | Admit: 2014-06-05 | Discharge: 2014-06-05 | Disposition: A | Discharge status: Home / Self Care | Payer: Commercial Managed Care - HMO | Attending: Emergency Medicine | Admitting: Emergency Medicine

## 2014-06-05 ENCOUNTER — Encounter (HOSPITAL_COMMUNITY): Payer: Self-pay | Admitting: Emergency Medicine

## 2014-06-05 DIAGNOSIS — Z8669 Personal history of other diseases of the nervous system and sense organs: Secondary | ICD-10-CM | POA: Diagnosis not present

## 2014-06-05 DIAGNOSIS — I252 Old myocardial infarction: Secondary | ICD-10-CM | POA: Insufficient documentation

## 2014-06-05 DIAGNOSIS — F419 Anxiety disorder, unspecified: Secondary | ICD-10-CM | POA: Insufficient documentation

## 2014-06-05 DIAGNOSIS — J329 Chronic sinusitis, unspecified: Secondary | ICD-10-CM

## 2014-06-05 DIAGNOSIS — Z7982 Long term (current) use of aspirin: Secondary | ICD-10-CM | POA: Insufficient documentation

## 2014-06-05 DIAGNOSIS — Z79899 Other long term (current) drug therapy: Secondary | ICD-10-CM | POA: Insufficient documentation

## 2014-06-05 DIAGNOSIS — R0981 Nasal congestion: Secondary | ICD-10-CM | POA: Diagnosis present

## 2014-06-05 DIAGNOSIS — J019 Acute sinusitis, unspecified: Secondary | ICD-10-CM | POA: Insufficient documentation

## 2014-06-05 DIAGNOSIS — E039 Hypothyroidism, unspecified: Secondary | ICD-10-CM | POA: Insufficient documentation

## 2014-06-05 DIAGNOSIS — I10 Essential (primary) hypertension: Secondary | ICD-10-CM | POA: Diagnosis not present

## 2014-06-05 MED ORDER — OXYMETAZOLINE HCL 0.05 % NA SOLN
2.0000 | Freq: Once | NASAL | Status: AC
  Administered 2014-06-05: 2 via NASAL
  Filled 2014-06-05: qty 15

## 2014-06-05 MED ORDER — PREDNISONE 10 MG PO TABS: ORAL_TABLET | ORAL | Status: DC

## 2014-06-05 MED ORDER — OXYMETAZOLINE HCL 0.05 % NA SOLN: 1.0000 | Freq: Once | NASAL | Status: DC

## 2014-06-05 MED ORDER — HYDROCODONE-ACETAMINOPHEN 5-325 MG PO TABS: 1.0000 | ORAL_TABLET | ORAL | Status: DC | PRN

## 2014-06-05 MED ORDER — PREDNISONE 50 MG PO TABS
60.0000 mg | ORAL_TABLET | Freq: Once | ORAL | Status: AC
  Administered 2014-06-05: 60 mg via ORAL
  Filled 2014-06-05 (×2): qty 1

## 2014-06-05 NOTE — ED Provider Notes (Signed)
CSN: [id number]     Arrival date & time 06/05/14  0806 History   First MD Initiated Contact with Patient 06/05/14 [phone]     Chief Complaint  Patient presents with  . Nasal Congestion     (Consider location/radiation/quality/duration/timing/severity/associated sxs/prior Treatment) Patient is a 69 y.o. female presenting with URI. The history is provided by the patient.  URI Presenting symptoms: congestion and cough   Severity:  Moderate Onset quality:  Gradual Duration:  3 days Timing:  Intermittent Progression:  Worsening Chronicity:  New Relieved by:  Nothing Worsened by:  Nothing tried Ineffective treatments:  None tried Associated symptoms: headaches, myalgias, sinus pain and sneezing   Associated symptoms comment:  Nasal congestion Eye redness Risk factors: chronic cardiac disease   Risk factors: no diabetes mellitus, no immunosuppression and no recent travel     Past Medical History  Diagnosis Date  . Hypertension   . Anxiety   . Sleep apnea   . Hypothyroidism   . NSTEMI (non-ST elevated myocardial infarction)     a.  Related to occlusion of the distal Cx. The mid to distal obtuse marginal #1 also contains a significant stenosis that is best treated with medical therapy. Patent RCA/LAD with tortuosity. LM patent. EF 55%.   [name] History of echocardiogram     a. 2D ECHO: 09/21/2013; EF 55-60%; mild concentric hypertrophy, G1DD, mild LA dilation, PA pressure 37. Small RV pericardial effusion   Past Surgical History  Procedure Laterality Date  . Thyroidectomy  10/08/2011    Procedure: THYROIDECTOMY;  Surgeon: [name], MD;  Location: [facility];  Service: General;  Laterality: N/A;  Total  . Cardiac catheterization  ~ 2000  . Coronary angioplasty  09/20/2013  . Vaginal hysterectomy  1993  . Left heart catheterization with coronary angiogram N/A 09/20/2013    Procedure: LEFT HEART CATHETERIZATION WITH CORONARY ANGIOGRAM;  Surgeon: Sinclair Grooms, MD;  Location: [facility] CATH  LAB;  Service: Cardiovascular;  Laterality: N/A;  . Left heart catheterization with coronary angiogram N/A 09/27/2013    Procedure: LEFT HEART CATHETERIZATION WITH CORONARY ANGIOGRAM;  Surgeon: Blane Ohara, MD;  Location: [facility] CATH LAB;  Service: Cardiovascular;  Laterality: N/A;   History reviewed. No pertinent family history. History  Substance Use Topics  . Smoking status: Never Smoker   . Smokeless tobacco: Never Used  . Alcohol Use: No   OB History    Gravida Para Term Preterm AB TAB SAB Ectopic Multiple Living   2 2 2             Review of Systems  HENT: Positive for congestion and sneezing.   Respiratory: Positive for cough.   Musculoskeletal: Positive for myalgias.  Neurological: Positive for headaches.  Psychiatric/Behavioral: The patient is nervous/anxious.   All other systems reviewed and are negative.     Allergies  Amlodipine; Lisinopril; and Ranexa  Home Medications   Prior to Admission medications   Medication Sig Start Date End Date Taking? Authorizing Provider  ALPRAZolam Duanne Moron) 0.5 MG tablet Take 0.5 mg by mouth as needed for anxiety.     Historical Provider, MD  Ascorbic Acid (VITAMIN C) 1000 MG tablet Take 1,000 mg by mouth daily.    Historical Provider, MD  aspirin 81 MG tablet Take 81 mg by mouth daily as needed (heart).     Historical Provider, MD  atorvastatin (LIPITOR) 80 MG tablet Take 80 mg by mouth daily. 09/22/13   Eileen Stanford, PA-C  chlorthalidone (HYGROTON) 25 MG  tablet TAKE 1 TABLET BY MOUTH DAILY 03/25/14   Herminio Commons, MD  levothyroxine (SYNTHROID, LEVOTHROID) 100 MCG tablet Take 100 mcg by mouth daily.    Historical Provider, MD  loratadine (CLARITIN) 10 MG tablet Take 1 tablet (10 mg total) by mouth daily. Patient not taking: Reported on 03/13/2014 02/03/14   Ashley Murrain, NP  metoprolol succinate (TOPROL-XL) 50 MG 24 hr tablet TAKE 1 TABLET BY MOUTH DAILY OR IMMEDIATELY FOLLOWING A MEAL 04/25/14   Herminio Commons, MD   Multiple Vitamin (MULTIVITAMIN WITH MINERALS) TABS Take 1 tablet by mouth daily.    Historical Provider, MD  nitroGLYCERIN (NITROSTAT) 0.4 MG SL tablet Place 1 tablet (0.4 mg total) under the tongue every 5 (five) minutes x 3 doses as needed for chest pain. 06/03/14   Herminio Commons, MD   BP 171/107 mmHg  Pulse 82  Temp(Src) 97.5 F (36.4 C) (Oral)  Resp 20  Ht 5\' 3"  (1.6 m)  Wt 190 lb (86.183 kg)  BMI 33.67 kg/m2  SpO2 97% Physical Exam  Constitutional: She is oriented to person, place, and time. She appears well-developed and well-nourished.  Non-toxic appearance.  HENT:  Head: Normocephalic.  Right Ear: Tympanic membrane and external ear normal.  Left Ear: Tympanic membrane and external ear normal.  Nasal congestion. Mild to mod pain to percussion of the sinuses  Eyes: EOM and lids are normal. Pupils are equal, round, and reactive to light. Right conjunctiva is injected. Left conjunctiva is injected.  Neck: Trachea normal and normal range of motion. Neck supple. Carotid bruit is not present. No rigidity.  Cardiovascular: Normal rate, regular rhythm, normal heart sounds, intact distal pulses and normal pulses.   Pulmonary/Chest: Breath sounds normal. No respiratory distress.  Abdominal: Soft. Bowel sounds are normal. There is no tenderness. There is no guarding.  Musculoskeletal: Normal range of motion.  Lymphadenopathy:       Head (right side): No submandibular adenopathy present.       Head (left side): No submandibular adenopathy present.    She has no cervical adenopathy.  Neurological: She is alert and oriented to person, place, and time. She has normal strength. No cranial nerve deficit or sensory deficit.  Skin: Skin is warm and dry.  Psychiatric: She has a normal mood and affect. Her speech is normal.  Nursing note and vitals reviewed.   ED Course  Procedures (including critical care time) Labs Review Labs Reviewed - No data to display  Imaging Review No  results found.   EKG Interpretation None      MDM  Blood pressure is elevated at 171/107. Patient has a history of hypertension. The remainder the vital signs are well within normal limits. The examination favors sinusitis and seasonal allergies. Patient will be treated with prednisone taper, hydrocodone for headache, Afrin spray every 8 hours for 5 days only. Patient is advised to follow with her primary physician, or to see the ear nose and throat specialist if not improving.    Final diagnoses:  None    *I have reviewed nursing notes, vital signs, and all appropriate lab and imaging results for this patient.[address], PA-C 06/05/14 [id number]  Ripley Fraise, MD 06/06/14 [phone]

## 2014-06-05 NOTE — ED Notes (Signed)
Pt reports nasal congestion,cough,headache for last several days. nad noted.

## 2014-06-05 NOTE — Discharge Instructions (Signed)
Please increase water and juices. This will sometimes help to thin the secretions. Please use cool compresses to the eyes several times a day. Please use prednisone taper as prescribed. Afrin spray, 2 squirts each nostril every 8 hours for 5 days only. Please use Tylenol or ibuprofen for mild pain/headache, use Norco for more severe pain. This medication may cause drowsiness, please use with caution. Please see Dr. Legrand Rams for follow-up in the office in the next 5-7 days. Please take these medications with you so that he may make them a part of your medical record. Sinusitis Sinusitis is redness, soreness, and inflammation of the paranasal sinuses. Paranasal sinuses are air pockets within the bones of your face (beneath the eyes, the middle of the forehead, or above the eyes). In healthy paranasal sinuses, mucus is able to drain out, and air is able to circulate through them by way of your nose. However, when your paranasal sinuses are inflamed, mucus and air can become trapped. This can allow bacteria and other germs to grow and cause infection. Sinusitis can develop quickly and last only a short time (acute) or continue over a long period (chronic). Sinusitis that lasts for more than 12 weeks is considered chronic.  CAUSES  Causes of sinusitis include:  Allergies.  Structural abnormalities, such as displacement of the cartilage that separates your nostrils (deviated septum), which can decrease the air flow through your nose and sinuses and affect sinus drainage.  Functional abnormalities, such as when the small hairs (cilia) that line your sinuses and help remove mucus do not work properly or are not present. SIGNS AND SYMPTOMS  Symptoms of acute and chronic sinusitis are the same. The primary symptoms are pain and pressure around the affected sinuses. Other symptoms include:  Upper toothache.  Earache.  Headache.  Bad breath.  Decreased sense of smell and taste.  A cough, which worsens when  you are lying flat.  Fatigue.  Fever.  Thick drainage from your nose, which often is green and may contain pus (purulent).  Swelling and warmth over the affected sinuses. DIAGNOSIS  Your health care provider will perform a physical exam. During the exam, your health care provider may:  Look in your nose for signs of abnormal growths in your nostrils (nasal polyps).  Tap over the affected sinus to check for signs of infection.  View the inside of your sinuses (endoscopy) using an imaging device that has a light attached (endoscope). If your health care provider suspects that you have chronic sinusitis, one or more of the following tests may be recommended:  Allergy tests.  Nasal culture. A sample of mucus is taken from your nose, sent to a lab, and screened for bacteria.  Nasal cytology. A sample of mucus is taken from your nose and examined by your health care provider to determine if your sinusitis is related to an allergy. TREATMENT  Most cases of acute sinusitis are related to a viral infection and will resolve on their own within 10 days. Sometimes medicines are prescribed to help relieve symptoms (pain medicine, decongestants, nasal steroid sprays, or saline sprays).  However, for sinusitis related to a bacterial infection, your health care provider will prescribe antibiotic medicines. These are medicines that will help kill the bacteria causing the infection.  Rarely, sinusitis is caused by a fungal infection. In theses cases, your health care provider will prescribe antifungal medicine. For some cases of chronic sinusitis, surgery is needed. Generally, these are cases in which sinusitis recurs more than  3 times per year, despite other treatments. HOME CARE INSTRUCTIONS   Drink plenty of water. Water helps thin the mucus so your sinuses can drain more easily.  Use a humidifier.  Inhale steam 3 to 4 times a day (for example, sit in the bathroom with the shower  running).  Apply a warm, moist washcloth to your face 3 to 4 times a day, or as directed by your health care provider.  Use saline nasal sprays to help moisten and clean your sinuses.  Take medicines only as directed by your health care provider.  If you were prescribed either an antibiotic or antifungal medicine, finish it all even if you start to feel better. SEEK IMMEDIATE MEDICAL CARE IF:  You have increasing pain or severe headaches.  You have nausea, vomiting, or drowsiness.  You have swelling around your face.  You have vision problems.  You have a stiff neck.  You have difficulty breathing. MAKE SURE YOU:   Understand these instructions.  Will watch your condition.  Will get help right away if you are not doing well or get worse. Document Released: 01/25/2005 Document Revised: 06/11/2013 Document Reviewed: 02/09/2011 [facility] Patient Information 2015 [place], [place]. This information is not intended to replace advice given to you by your health care provider. Make sure you discuss any questions you have with your health care provider.

## 2014-06-13 DIAGNOSIS — E669 Obesity, unspecified: Secondary | ICD-10-CM | POA: Diagnosis not present

## 2014-06-13 DIAGNOSIS — G4700 Insomnia, unspecified: Secondary | ICD-10-CM | POA: Diagnosis not present

## 2014-06-13 DIAGNOSIS — E039 Hypothyroidism, unspecified: Secondary | ICD-10-CM | POA: Diagnosis not present

## 2014-06-13 DIAGNOSIS — F419 Anxiety disorder, unspecified: Secondary | ICD-10-CM | POA: Diagnosis not present

## 2014-07-15 ENCOUNTER — Encounter (HOSPITAL_COMMUNITY): Payer: Self-pay | Admitting: Emergency Medicine

## 2014-07-15 ENCOUNTER — Emergency Department (HOSPITAL_COMMUNITY)
Admission: EM | Admit: 2014-07-15 | Discharge: 2014-07-15 | Disposition: A | Discharge status: Home / Self Care | Payer: Commercial Managed Care - HMO | Attending: Emergency Medicine | Admitting: Emergency Medicine

## 2014-07-15 DIAGNOSIS — Z79899 Other long term (current) drug therapy: Secondary | ICD-10-CM | POA: Insufficient documentation

## 2014-07-15 DIAGNOSIS — Z87891 Personal history of nicotine dependence: Secondary | ICD-10-CM | POA: Diagnosis not present

## 2014-07-15 DIAGNOSIS — Z8639 Personal history of other endocrine, nutritional and metabolic disease: Secondary | ICD-10-CM | POA: Diagnosis not present

## 2014-07-15 DIAGNOSIS — J0101 Acute recurrent maxillary sinusitis: Secondary | ICD-10-CM

## 2014-07-15 DIAGNOSIS — R0981 Nasal congestion: Secondary | ICD-10-CM | POA: Diagnosis present

## 2014-07-15 DIAGNOSIS — I10 Essential (primary) hypertension: Secondary | ICD-10-CM | POA: Diagnosis not present

## 2014-07-15 DIAGNOSIS — R51 Headache: Secondary | ICD-10-CM | POA: Diagnosis not present

## 2014-07-15 DIAGNOSIS — I252 Old myocardial infarction: Secondary | ICD-10-CM | POA: Diagnosis not present

## 2014-07-15 HISTORY — DX: Acute myocardial infarction, unspecified: I21.9

## 2014-07-15 MED ORDER — PREDNISONE 10 MG PO TABS: ORAL_TABLET | ORAL | Status: DC

## 2014-07-15 MED ORDER — AMOXICILLIN-POT CLAVULANATE 875-125 MG PO TABS: 1.0000 | ORAL_TABLET | Freq: Two times a day (BID) | ORAL | Status: DC

## 2014-07-15 MED ORDER — HYDROCODONE-ACETAMINOPHEN 5-325 MG PO TABS: 2.0000 | ORAL_TABLET | ORAL | Status: DC | PRN

## 2014-07-15 NOTE — Discharge Instructions (Signed)

## 2014-07-15 NOTE — ED Provider Notes (Signed)
CSN: [id number]     Arrival date & time 07/15/14  1249 History  This chart was scribed for non-physician practitioner, Alyse Low, PA-C, working with Nat Christen, MD, by Stephania Fragmin, ED Scribe. This patient was seen in room APFT23/APFT23 and the patient's care was started at 2:47 PM.   Chief Complaint  Patient presents with  . Nasal Congestion   The history is provided by the patient. No language interpreter was used.     HPI Comments: [name] [name] is a 69 y.o. female who presents to the Emergency Department complaining of sinus pressure and yellow nasal congestion. She complains of an associated headache. Patient states she has a history of recurrent sinusitis, for which she sees a specialist. However, she is unable to see him until 09/01/14. Patient was last on prednisone in April. She denies a history of DM. Patient is currently taking hydrocodone at home for her headache, but she states she is about to run out. She has NKDA. She denies SOB. Patient is not driving today.  Past Medical History  Diagnosis Date  . Heart attack   . Hypertension   . Thyroid disease    Past Surgical History  Procedure Laterality Date  . Abdominal hysterectomy     History reviewed. No pertinent family history. History  Substance Use Topics  . Smoking status: Former Research scientist (life sciences)  . Smokeless tobacco: Not on file  . Alcohol Use: No   OB History    No data available     Review of Systems  HENT: Positive for congestion and sinus pressure.   Respiratory: Negative for shortness of breath.   Neurological: Positive for headaches.  All other systems reviewed and are negative.  Allergies  Review of patient's allergies indicates no known allergies.  Home Medications   Prior to Admission medications   Not on File   BP 152/84 mmHg  Pulse 72  Temp(Src) 98.4 F (36.9 C) (Oral)  Resp 18  Ht 5\' 3"  (1.6 m)  Wt 187 lb (84.823 kg)  BMI 33.13 kg/m2  SpO2 99% Physical Exam  Constitutional: She is oriented to  person, place, and time. She appears well-developed and well-nourished. No distress.  HENT:  Head: Normocephalic and atraumatic.  Nasal congestion. Sinus tenderness.  Eyes: Conjunctivae and EOM are normal.  Neck: Neck supple. No tracheal deviation present.  Cardiovascular: Normal rate.   Pulmonary/Chest: Effort normal. No respiratory distress.  Musculoskeletal: Normal range of motion.  Neurological: She is alert and oriented to person, place, and time.  Skin: Skin is warm and dry.  Psychiatric: She has a normal mood and affect. Her behavior is normal.  Nursing note and vitals reviewed.   ED Course  Procedures (including critical care time)  DIAGNOSTIC STUDIES: Oxygen Saturation is 99% on RA, normal by my interpretation.    COORDINATION OF CARE: 2:52 PM - Discussed treatment plan with pt at bedside which includes antibiotics (augmentin), prednisone, and hydrocodone, and pt agreed to plan.   MDM   Final diagnoses:  Acute recurrent maxillary sinusitis    Hydrocodone Prednisone taper augmentin avs  I personally performed the services in this documentation, which was scribed in my presence.  The recorded information has been reviewed and considered.   Ronnald Collum.   Fransico Meadow, PA-C 07/15/14 [place], PA-C 07/15/14 [place], MD 07/18/14 [phone]

## 2014-07-15 NOTE — ED Notes (Signed)
Having nasal congestion.  Yellowish drainage noted.

## 2014-07-23 ENCOUNTER — Other Ambulatory Visit: Payer: Self-pay | Admitting: Cardiovascular Disease

## 2014-07-25 ENCOUNTER — Encounter (HOSPITAL_COMMUNITY): Payer: Self-pay | Admitting: Emergency Medicine

## 2014-08-29 ENCOUNTER — Ambulatory Visit (INDEPENDENT_AMBULATORY_CARE_PROVIDER_SITE_OTHER): Payer: Self-pay | Admitting: Otolaryngology

## 2014-08-29 DIAGNOSIS — R42 Dizziness and giddiness: Secondary | ICD-10-CM | POA: Diagnosis not present

## 2014-08-29 DIAGNOSIS — R51 Headache: Secondary | ICD-10-CM | POA: Diagnosis not present

## 2014-08-29 DIAGNOSIS — I252 Old myocardial infarction: Secondary | ICD-10-CM | POA: Diagnosis not present

## 2014-08-29 DIAGNOSIS — E039 Hypothyroidism, unspecified: Secondary | ICD-10-CM | POA: Diagnosis not present

## 2014-08-29 DIAGNOSIS — Z79899 Other long term (current) drug therapy: Secondary | ICD-10-CM | POA: Diagnosis not present

## 2014-08-29 DIAGNOSIS — I10 Essential (primary) hypertension: Secondary | ICD-10-CM | POA: Diagnosis not present

## 2014-08-29 DIAGNOSIS — J019 Acute sinusitis, unspecified: Secondary | ICD-10-CM | POA: Diagnosis not present

## 2014-08-29 DIAGNOSIS — Z7982 Long term (current) use of aspirin: Secondary | ICD-10-CM | POA: Diagnosis not present

## 2014-08-29 DIAGNOSIS — Z8249 Family history of ischemic heart disease and other diseases of the circulatory system: Secondary | ICD-10-CM | POA: Diagnosis not present

## 2014-09-09 ENCOUNTER — Encounter: Payer: Self-pay | Admitting: *Deleted

## 2014-09-09 ENCOUNTER — Other Ambulatory Visit: Payer: Self-pay | Admitting: *Deleted

## 2014-09-09 DIAGNOSIS — E785 Hyperlipidemia, unspecified: Secondary | ICD-10-CM

## 2014-09-20 ENCOUNTER — Telehealth: Payer: Self-pay | Admitting: *Deleted

## 2014-09-20 DIAGNOSIS — E785 Hyperlipidemia, unspecified: Secondary | ICD-10-CM | POA: Diagnosis not present

## 2014-09-20 MED ORDER — EZETIMIBE 10 MG PO TABS: 10.0000 mg | ORAL_TABLET | Freq: Every day | ORAL | Status: DC

## 2014-09-20 NOTE — Telephone Encounter (Signed)
-----   Message from Herminio Commons, MD sent at 09/20/2014 11:44 AM EDT ----- LDL still elevated. Please add Zetia 10 mg.

## 2014-09-20 NOTE — Telephone Encounter (Signed)
Pt aware, Medication sent to pharmacy, routed to pcp

## 2014-09-23 DIAGNOSIS — E663 Overweight: Secondary | ICD-10-CM | POA: Diagnosis not present

## 2014-09-23 DIAGNOSIS — J209 Acute bronchitis, unspecified: Secondary | ICD-10-CM | POA: Diagnosis not present

## 2014-09-23 DIAGNOSIS — E039 Hypothyroidism, unspecified: Secondary | ICD-10-CM | POA: Diagnosis not present

## 2014-09-23 DIAGNOSIS — J40 Bronchitis, not specified as acute or chronic: Secondary | ICD-10-CM | POA: Diagnosis not present

## 2014-09-23 DIAGNOSIS — I10 Essential (primary) hypertension: Secondary | ICD-10-CM | POA: Diagnosis not present

## 2014-09-23 DIAGNOSIS — R51 Headache: Secondary | ICD-10-CM | POA: Diagnosis not present

## 2014-09-23 DIAGNOSIS — I252 Old myocardial infarction: Secondary | ICD-10-CM | POA: Diagnosis not present

## 2014-09-23 DIAGNOSIS — R05 Cough: Secondary | ICD-10-CM | POA: Diagnosis not present

## 2014-09-23 DIAGNOSIS — Z79899 Other long term (current) drug therapy: Secondary | ICD-10-CM | POA: Diagnosis not present

## 2014-09-24 DIAGNOSIS — E663 Overweight: Secondary | ICD-10-CM | POA: Diagnosis not present

## 2014-09-24 DIAGNOSIS — I10 Essential (primary) hypertension: Secondary | ICD-10-CM | POA: Diagnosis not present

## 2014-09-24 DIAGNOSIS — J40 Bronchitis, not specified as acute or chronic: Secondary | ICD-10-CM | POA: Diagnosis not present

## 2014-09-24 DIAGNOSIS — E039 Hypothyroidism, unspecified: Secondary | ICD-10-CM | POA: Diagnosis not present

## 2014-09-24 DIAGNOSIS — I252 Old myocardial infarction: Secondary | ICD-10-CM | POA: Diagnosis not present

## 2014-09-24 DIAGNOSIS — Z79899 Other long term (current) drug therapy: Secondary | ICD-10-CM | POA: Diagnosis not present

## 2014-09-24 DIAGNOSIS — R51 Headache: Secondary | ICD-10-CM | POA: Diagnosis not present

## 2014-10-07 DIAGNOSIS — G4700 Insomnia, unspecified: Secondary | ICD-10-CM | POA: Diagnosis not present

## 2014-10-07 DIAGNOSIS — J309 Allergic rhinitis, unspecified: Secondary | ICD-10-CM | POA: Diagnosis not present

## 2014-10-07 DIAGNOSIS — I10 Essential (primary) hypertension: Secondary | ICD-10-CM | POA: Diagnosis not present

## 2014-10-07 DIAGNOSIS — I214 Non-ST elevation (NSTEMI) myocardial infarction: Secondary | ICD-10-CM | POA: Diagnosis not present

## 2014-10-07 DIAGNOSIS — E669 Obesity, unspecified: Secondary | ICD-10-CM | POA: Diagnosis not present

## 2014-10-07 DIAGNOSIS — E039 Hypothyroidism, unspecified: Secondary | ICD-10-CM | POA: Diagnosis not present

## 2014-10-07 DIAGNOSIS — I2510 Atherosclerotic heart disease of native coronary artery without angina pectoris: Secondary | ICD-10-CM | POA: Diagnosis not present

## 2014-10-15 ENCOUNTER — Other Ambulatory Visit: Payer: Self-pay | Admitting: Cardiovascular Disease

## 2014-10-15 DIAGNOSIS — I2510 Atherosclerotic heart disease of native coronary artery without angina pectoris: Secondary | ICD-10-CM | POA: Diagnosis not present

## 2014-10-15 DIAGNOSIS — J329 Chronic sinusitis, unspecified: Secondary | ICD-10-CM | POA: Diagnosis not present

## 2014-10-15 DIAGNOSIS — E039 Hypothyroidism, unspecified: Secondary | ICD-10-CM | POA: Diagnosis not present

## 2014-10-15 DIAGNOSIS — I214 Non-ST elevation (NSTEMI) myocardial infarction: Secondary | ICD-10-CM | POA: Diagnosis not present

## 2014-10-15 DIAGNOSIS — J300 Vasomotor rhinitis: Secondary | ICD-10-CM | POA: Diagnosis not present

## 2014-10-15 DIAGNOSIS — I10 Essential (primary) hypertension: Secondary | ICD-10-CM | POA: Diagnosis not present

## 2014-10-15 DIAGNOSIS — E669 Obesity, unspecified: Secondary | ICD-10-CM | POA: Diagnosis not present

## 2014-10-15 DIAGNOSIS — G4700 Insomnia, unspecified: Secondary | ICD-10-CM | POA: Diagnosis not present

## 2014-10-15 DIAGNOSIS — J309 Allergic rhinitis, unspecified: Secondary | ICD-10-CM | POA: Diagnosis not present

## 2014-12-17 ENCOUNTER — Ambulatory Visit: Payer: Self-pay | Admitting: Cardiovascular Disease

## 2014-12-30 DIAGNOSIS — J019 Acute sinusitis, unspecified: Secondary | ICD-10-CM | POA: Diagnosis not present

## 2014-12-30 DIAGNOSIS — I10 Essential (primary) hypertension: Secondary | ICD-10-CM | POA: Diagnosis not present

## 2014-12-30 DIAGNOSIS — I2510 Atherosclerotic heart disease of native coronary artery without angina pectoris: Secondary | ICD-10-CM | POA: Diagnosis not present

## 2015-01-09 ENCOUNTER — Encounter: Payer: Self-pay | Admitting: Cardiovascular Disease

## 2015-01-09 ENCOUNTER — Ambulatory Visit (INDEPENDENT_AMBULATORY_CARE_PROVIDER_SITE_OTHER): Payer: Commercial Managed Care - HMO | Admitting: Cardiovascular Disease

## 2015-01-09 VITALS — BP 135/86 | HR 71 | Ht 63.0 in | Wt 195.0 lb

## 2015-01-09 DIAGNOSIS — E785 Hyperlipidemia, unspecified: Secondary | ICD-10-CM

## 2015-01-09 DIAGNOSIS — I2510 Atherosclerotic heart disease of native coronary artery without angina pectoris: Secondary | ICD-10-CM | POA: Diagnosis not present

## 2015-01-09 DIAGNOSIS — I10 Essential (primary) hypertension: Secondary | ICD-10-CM | POA: Diagnosis not present

## 2015-01-09 DIAGNOSIS — Z9114 Patient's other noncompliance with medication regimen: Secondary | ICD-10-CM | POA: Diagnosis not present

## 2015-01-09 MED ORDER — ROSUVASTATIN CALCIUM 5 MG PO TABS: 5.0000 mg | ORAL_TABLET | Freq: Every day | ORAL | Status: DC

## 2015-01-09 NOTE — Progress Notes (Signed)
Patient ID: [name] [name], female   DOB: [birth year], 69 y.o.   MRN: [id number]      SUBJECTIVE: The patient presents for routine follow up. She has a history of CAD. She was admitted 09/20/13 to 09/22/13 with chest pain and sustained a NSTEMI. Cath 09/20/13 showed distally occluded OM1 60-80%. It was treated medically at that time and she was discharged home. She was readmitted with chest pain a few days later, and repeat cath on 09/27/13 showed subtotal occlusion of a medium caliber highly tortuous OM worse from prior study, described as unfavorable for PCI, though if refractory angina PCI could be considered per notes.  09/21/13 Echo LVEF 55-60%.  PMH also includes HTN, hyperlipidemia, pre-diabetes mellitus, and OSA .  She has previously been unable to tolerate Ranexa, Imdur, amlodipine, and lisinopril.  She denies chest pain and shortness of breath.    She stopped Lipitor and metoprolol on her own.  She has lost 8 lbs by cutting out starch-laden foods and red meat. She dances and walks for exercise.  Soc: She previously told me about her daughter who is studying sociology and business in [place] for 6 months. 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   Review of Systems: As per "subjective", otherwise negative.  Allergies  Allergen Reactions  . Amlodipine     Lip swelling per patient.   . Lisinopril     Dr. Legrand Rams stopped due to side effects per patient.  Could remember what side effects were.    [name] [Ranolazine]     Chest pain    Current Outpatient Prescriptions  Medication Sig Dispense Refill  . ALPRAZolam (XANAX) 0.5 MG tablet Take 0.5 mg by mouth as needed for anxiety.     . Ascorbic Acid (VITAMIN C) 1000 MG tablet Take 1,000 mg by mouth daily.    [name] ezetimibe (ZETIA) 10 MG tablet Take 1 tablet (10 mg total) by mouth daily. 90 tablet 0  . levothyroxine (SYNTHROID, LEVOTHROID) 100 MCG tablet Take 100 mcg by mouth daily.    . Multiple Vitamins-Minerals  (MULTIVITAMINS THER. W/MINERALS) TABS tablet Take 1 tablet by mouth daily.    . nitroGLYCERIN (NITROSTAT) 0.4 MG SL tablet Place 1 tablet (0.4 mg total) under the tongue every 5 (five) minutes x 3 doses as needed for chest pain. 25 tablet 3  . amLODipine (NORVASC) 5 MG tablet Take 5 mg by mouth daily.    [name] aspirin 81 MG tablet Take 81 mg by mouth daily as needed (heart).      No current facility-administered medications for this visit.    Past Medical History  Diagnosis Date  . Anxiety   . Sleep apnea   . Hypothyroidism   . NSTEMI (non-ST elevated myocardial infarction) ([place])     a.  Related to occlusion of the distal Cx. The mid to distal obtuse marginal #1 also contains a significant stenosis that is best treated with medical therapy. Patent RCA/LAD with tortuosity. LM patent. EF 55%.   [name] History of echocardiogram     a. 2D ECHO: 09/21/2013; EF 55-60%; mild concentric hypertrophy, G1DD, mild LA dilation, PA pressure 37. Small RV pericardial effusion  . Heart attack ([place])   . Hypertension   . Thyroid disease     Past Surgical History  Procedure Laterality Date  . Thyroidectomy  10/08/2011    Procedure: THYROIDECTOMY;  Surgeon: [name], MD;  Location: [facility];  Service: General;  Laterality: N/A;  Total  .  Cardiac catheterization  ~ 2000  . Coronary angioplasty  09/20/2013  . Vaginal hysterectomy  1993  . Left heart catheterization with coronary angiogram N/A 09/20/2013    Procedure: LEFT HEART CATHETERIZATION WITH CORONARY ANGIOGRAM;  Surgeon: Sinclair Grooms, MD;  Location: [facility] CATH LAB;  Service: Cardiovascular;  Laterality: N/A;  . Left heart catheterization with coronary angiogram N/A 09/27/2013    Procedure: LEFT HEART CATHETERIZATION WITH CORONARY ANGIOGRAM;  Surgeon: Blane Ohara, MD;  Location: [facility] CATH LAB;  Service: Cardiovascular;  Laterality: N/A;  . Abdominal hysterectomy      Social History   Social History  . Marital Status: Widowed    Spouse Name: N/A    . Number of Children: N/A  . Years of Education: N/A   Occupational History  . Not on file.   Social History Main Topics  . Smoking status: Former Research scientist (life sciences)  . Smokeless tobacco: Never Used  . Alcohol Use: No  . Drug Use: No  . Sexual Activity: No   Other Topics Concern  . Not on file   Social History Narrative   ** Merged History Encounter **         Filed Vitals:   01/09/15 0821  BP: 135/86  Pulse: 71  Height: 5\' 3"  (1.6 m)  Weight: 195 lb (88.451 kg)    PHYSICAL EXAM General: NAD HEENT: Normal. Neck: No JVD, no thyromegaly. Lungs: Clear to auscultation bilaterally with normal respiratory effort. CV: Nondisplaced PMI.  Regular rate and rhythm, normal S1/S2, no S3/S4, no murmur. No pretibial or periankle edema.  No carotid bruit.   Abdomen: Soft, nontender, no hepatosplenomegaly, no distention.  Neurologic: Alert and oriented x 3.  Psych: Normal affect. Skin: Normal. Musculoskeletal: No gross deformities. Extremities: No clubbing or cyanosis.   ECG: Most recent ECG reviewed.      ASSESSMENT AND PLAN: 1. CAD: Stable ischemic heart disease. Continue aspirin 81 mg. Has now stopped Lipitor 80 mg and Toprol-XL 50 mg. Ranexa caused her to have chest pain so she stopped taking it. Has been unable to tolerate Imdur and amlodipine as well. Stopped taking Plavix in the past. I will try Crestor 5 mg.  2. Essential HTN: Controlled. Both amlodipine and lisinopril caused lip swelling. She stopped chlorthalidone 25 mg daily. Will monitor.  3. Hyperlipidemia: Had been on high dose Lipitor but stopped it. LDL 98 on 09/20/2014, at which time I prescribed Zetia 10 mg. I will try Crestor 5 mg.  Dispo: f/u 1 year.  Kate Sable, M.D., F.A.C.C.

## 2015-01-09 NOTE — Patient Instructions (Signed)
   Remain off of the Amlodipine, removed from medication list today.  Begin Crestor 5mg  daily - new sent to [place] today. Continue all other medications.   Your physician wants you to follow up in:  1 year.  You will receive a reminder letter in the mail one-two months in advance.  If you don't receive a letter, please call our office to schedule the follow up appointment

## 2015-02-03 DIAGNOSIS — I252 Old myocardial infarction: Secondary | ICD-10-CM | POA: Diagnosis not present

## 2015-02-03 DIAGNOSIS — R1031 Right lower quadrant pain: Secondary | ICD-10-CM | POA: Diagnosis not present

## 2015-02-03 DIAGNOSIS — R1030 Lower abdominal pain, unspecified: Secondary | ICD-10-CM | POA: Diagnosis not present

## 2015-02-03 DIAGNOSIS — E039 Hypothyroidism, unspecified: Secondary | ICD-10-CM | POA: Diagnosis not present

## 2015-02-03 DIAGNOSIS — Z79899 Other long term (current) drug therapy: Secondary | ICD-10-CM | POA: Diagnosis not present

## 2015-02-03 DIAGNOSIS — K5900 Constipation, unspecified: Secondary | ICD-10-CM | POA: Diagnosis not present

## 2015-02-03 DIAGNOSIS — I10 Essential (primary) hypertension: Secondary | ICD-10-CM | POA: Diagnosis not present

## 2015-02-18 ENCOUNTER — Telehealth: Payer: Self-pay | Admitting: *Deleted

## 2015-02-18 NOTE — Telephone Encounter (Signed)
Received FYI fax from [place] stating patient had stopped Crestor 5mg  due to side effects.  Stated she is following up with labs this month.  Call placed to patient to inquire about side effects.  Stated the Crestor caused her to have black spots come out all over her body.  She also stated that PMD had put her back on Metoprolol Suc 50mg  daily.  The labs she is planning to do is for her pmd.  Patient also stated that she has started exercising, eating differently, and taking a lot of vitamins.  Says she has lost 10-12 pounds so far.    Message fwd to Dr. Bronson Ing for review.

## 2015-02-18 NOTE — Telephone Encounter (Signed)
Based on her CAD, statin therapy is indicated. Would she be willing to try a statin with lesser side effects such as Livalo 2 mg daily?

## 2015-02-19 MED ORDER — PITAVASTATIN CALCIUM 2 MG PO TABS: 1.0000 | ORAL_TABLET | Freq: Every day | ORAL | Status: DC

## 2015-02-19 NOTE — Telephone Encounter (Signed)
Patient notified & agrees to try this new medication.  Will leave 2 weeks of samples up front for pick up today.  She will call back if able to tolerate.

## 2015-03-14 MED ORDER — PITAVASTATIN CALCIUM 2 MG PO TABS: 1.0000 | ORAL_TABLET | Freq: Every day | ORAL | Status: DC

## 2015-03-14 NOTE — Addendum Note (Signed)
Addended by: Julian Hy T on: 03/14/2015 04:44 PM   Modules accepted: Orders

## 2015-03-14 NOTE — Telephone Encounter (Signed)
F/u from trying Livalo, pt says she is tolerating well, only side effect is a dry mouth. Pt has been taking every other day and requesting medication to be sent to pharmacy. Medication sent to pharmacy. Will forward to Dr. Bronson Ing as Juluis Rainier

## 2015-03-31 ENCOUNTER — Telehealth: Payer: Self-pay | Admitting: Cardiovascular Disease

## 2015-03-31 NOTE — Telephone Encounter (Signed)
Unlikely due to Livalo as she had been off it. Would restart.

## 2015-03-31 NOTE — Telephone Encounter (Signed)
Patient called stating that taking Livalo 2 mg .States that her lip is swelling and she has been having a burning sensation to her lips .  Please call [phone]

## 2015-03-31 NOTE — Telephone Encounter (Signed)
Call placed to patient.  Patient stated that she initially started with cramping in her toes & right foot.  Then started having burning in lips (lower lip only), itching & swelling.  This has been going on for about 4 days.  Stated she did take Benadryl & swelling has gotten a lot better.  No tongue or throat swelling.  She stated that she had been off of the Livalo for about 5 days due to being out & her pharmacy having to order medication for her.  I explained to her that this is not likely the Livalo as she had been off for all those days.  Another medication may be the culprit.  Will have her remain off this medication anyway till provider responds to this message.  Patient verbalized understanding.

## 2015-04-01 NOTE — Telephone Encounter (Signed)
Left message to return call 

## 2015-04-01 NOTE — Telephone Encounter (Signed)
Patient notified.  Stated her lips were still swollen a little.  Reiterated that it probably is not the Livalo & recommended she resume the Livalo.  Also suggested that she may try to eliminate which medication may be the cause.  Her Zetia & Metoprolol are fairly new medications as well.  Suggested she stop the Zetia for a few days to see if her symptoms improve.  If not, she may try the same thing with the Metoprolol.  Asked her to call back with update.

## 2015-04-08 DIAGNOSIS — I10 Essential (primary) hypertension: Secondary | ICD-10-CM | POA: Diagnosis not present

## 2015-04-08 DIAGNOSIS — I2510 Atherosclerotic heart disease of native coronary artery without angina pectoris: Secondary | ICD-10-CM | POA: Diagnosis not present

## 2015-04-08 DIAGNOSIS — E669 Obesity, unspecified: Secondary | ICD-10-CM | POA: Diagnosis not present

## 2015-04-08 DIAGNOSIS — J309 Allergic rhinitis, unspecified: Secondary | ICD-10-CM | POA: Diagnosis not present

## 2015-04-08 DIAGNOSIS — E039 Hypothyroidism, unspecified: Secondary | ICD-10-CM | POA: Diagnosis not present

## 2015-04-08 DIAGNOSIS — G4700 Insomnia, unspecified: Secondary | ICD-10-CM | POA: Diagnosis not present

## 2015-04-13 DIAGNOSIS — R109 Unspecified abdominal pain: Secondary | ICD-10-CM | POA: Diagnosis not present

## 2015-04-13 DIAGNOSIS — E669 Obesity, unspecified: Secondary | ICD-10-CM | POA: Diagnosis not present

## 2015-04-13 DIAGNOSIS — I252 Old myocardial infarction: Secondary | ICD-10-CM | POA: Diagnosis not present

## 2015-04-13 DIAGNOSIS — K828 Other specified diseases of gallbladder: Secondary | ICD-10-CM | POA: Diagnosis not present

## 2015-04-13 DIAGNOSIS — E039 Hypothyroidism, unspecified: Secondary | ICD-10-CM | POA: Diagnosis not present

## 2015-04-13 DIAGNOSIS — K810 Acute cholecystitis: Secondary | ICD-10-CM | POA: Diagnosis not present

## 2015-04-13 DIAGNOSIS — Z79899 Other long term (current) drug therapy: Secondary | ICD-10-CM | POA: Diagnosis not present

## 2015-04-13 DIAGNOSIS — I2510 Atherosclerotic heart disease of native coronary artery without angina pectoris: Secondary | ICD-10-CM | POA: Diagnosis not present

## 2015-04-13 DIAGNOSIS — K8018 Calculus of gallbladder with other cholecystitis without obstruction: Secondary | ICD-10-CM | POA: Diagnosis not present

## 2015-04-13 DIAGNOSIS — I10 Essential (primary) hypertension: Secondary | ICD-10-CM | POA: Diagnosis not present

## 2015-04-13 DIAGNOSIS — R1013 Epigastric pain: Secondary | ICD-10-CM | POA: Diagnosis not present

## 2015-04-13 DIAGNOSIS — R1012 Left upper quadrant pain: Secondary | ICD-10-CM | POA: Diagnosis not present

## 2015-04-13 DIAGNOSIS — R1011 Right upper quadrant pain: Secondary | ICD-10-CM | POA: Diagnosis not present

## 2015-04-13 DIAGNOSIS — Z79891 Long term (current) use of opiate analgesic: Secondary | ICD-10-CM | POA: Diagnosis not present

## 2015-04-13 DIAGNOSIS — K8020 Calculus of gallbladder without cholecystitis without obstruction: Secondary | ICD-10-CM | POA: Diagnosis not present

## 2015-04-13 DIAGNOSIS — K829 Disease of gallbladder, unspecified: Secondary | ICD-10-CM | POA: Diagnosis not present

## 2015-04-13 DIAGNOSIS — K567 Ileus, unspecified: Secondary | ICD-10-CM | POA: Diagnosis not present

## 2015-04-15 DIAGNOSIS — K8018 Calculus of gallbladder with other cholecystitis without obstruction: Secondary | ICD-10-CM | POA: Diagnosis not present

## 2015-05-22 IMAGING — CT CT ANGIO CHEST
1 of 6 series · 5 of 36 positions shown · IV contrast (Omnipaque 300)
Comparison: CT chest 09/20/2013 and 02/17/2009.

CLINICAL DATA: Left and mid back pain since [DATE] a.m. the night of
03/17/2014.

EXAM:
CT ANGIOGRAPHY CHEST WITH CONTRAST
TECHNIQUE: Multidetector CT imaging of the chest was performed using the
standard protocol during bolus administration of intravenous
contrast. Multiplanar CT image reconstructions and MIPs were
obtained to evaluate the vascular anatomy.
CONTRAST:  100 mL OMNIPAQUE IOHEXOL 350 MG/ML SOLN

[Series 6: pe 3.0 b40f · axial · 0.54mm/px · z∈[-257,-77]mm · 5 of 90 slices shown]
[im 15/90  lung]
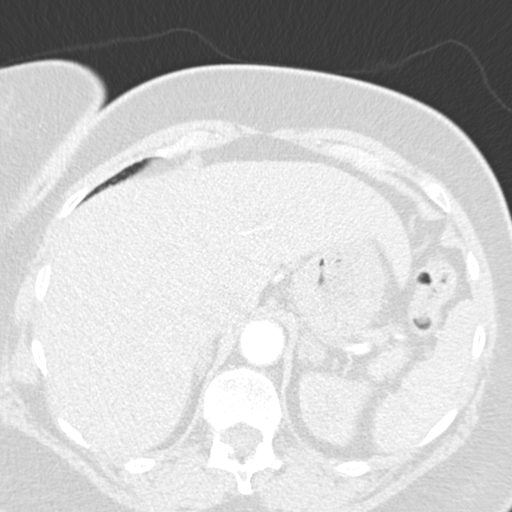
[im 30/90  mediastinal]
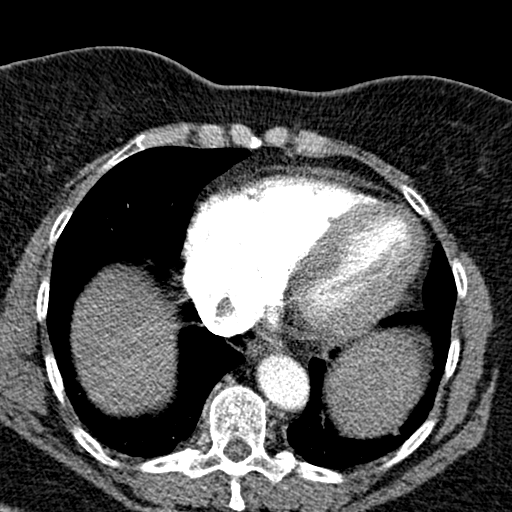
[im 45/90  lung]
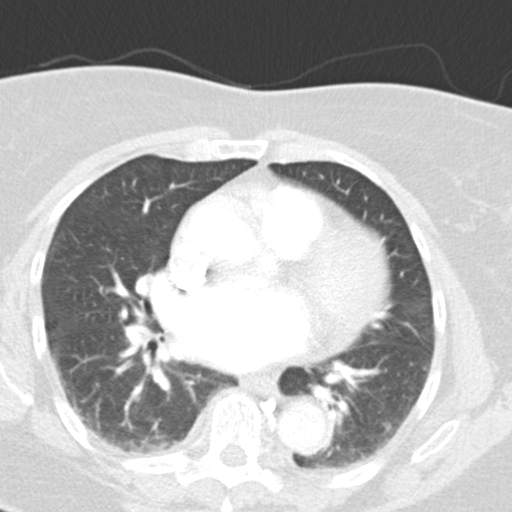
[im 60/90  mediastinal]
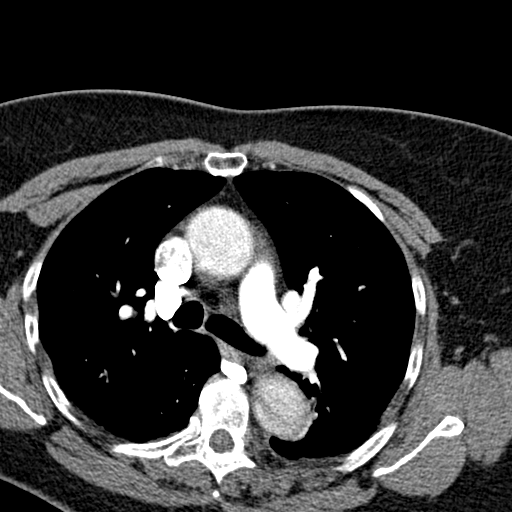
[im 75/90  lung]
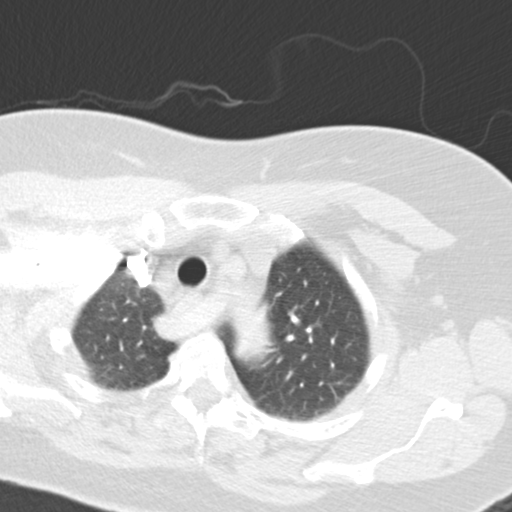

[5 of 36 positions shown; findings below may reference images not displayed]

FINDINGS: Aberrant right subclavian artery is incidentally noted. No pulmonary
embolus is identified. There is cardiomegaly. No pleural or
pericardial effusion. Very small hiatal hernia is seen. Mild,
scattered aortic atherosclerosis is seen. No calcific coronary
artery disease is identified. Two subcentimeter nodules in the right
middle lobe are stable in appearance on images 52 and 54. Tiny
subpleural nodule in the right upper lobe is stable on image 49.
Right lower lobe nodular opacity is stable on image 52. No new or
enlarging pulmonary nodule is identified.

Visualized upper abdomen demonstrates no focal abnormality. No focal
bony abnormality is seen.

Review of the MIP images confirms the above findings.
IMPRESSION: Negative for pulmonary embolus or acute disease.

Cardiomegaly.

Small pulmonary nodules are stable since 6422 consistent with
difficulty.

Small hiatal hernia.

## 2015-06-15 ENCOUNTER — Encounter (HOSPITAL_COMMUNITY): Payer: Self-pay | Admitting: Emergency Medicine

## 2015-06-15 ENCOUNTER — Emergency Department (HOSPITAL_COMMUNITY)
Admission: EM | Admit: 2015-06-15 | Discharge: 2015-06-15 | Disposition: A | Discharge status: Home / Self Care | Payer: Commercial Managed Care - HMO | Attending: Emergency Medicine | Admitting: Emergency Medicine

## 2015-06-15 DIAGNOSIS — J0100 Acute maxillary sinusitis, unspecified: Secondary | ICD-10-CM | POA: Insufficient documentation

## 2015-06-15 DIAGNOSIS — I10 Essential (primary) hypertension: Secondary | ICD-10-CM | POA: Insufficient documentation

## 2015-06-15 DIAGNOSIS — J029 Acute pharyngitis, unspecified: Secondary | ICD-10-CM | POA: Diagnosis present

## 2015-06-15 DIAGNOSIS — Z87891 Personal history of nicotine dependence: Secondary | ICD-10-CM | POA: Diagnosis not present

## 2015-06-15 DIAGNOSIS — E039 Hypothyroidism, unspecified: Secondary | ICD-10-CM | POA: Diagnosis not present

## 2015-06-15 MED ORDER — AMOXICILLIN-POT CLAVULANATE 875-125 MG PO TABS: 1.0000 | ORAL_TABLET | Freq: Two times a day (BID) | ORAL | Status: DC

## 2015-06-15 NOTE — ED Notes (Signed)
Pt reports nasal congestion, HA, otalgia, and sore throat since Friday.

## 2015-06-15 NOTE — Discharge Instructions (Signed)

## 2015-06-15 NOTE — ED Provider Notes (Signed)
CSN: [id number]     Arrival date & time 06/15/15  1035 History   First MD Initiated Contact with Patient 06/15/15 1057     Chief Complaint  Patient presents with  . Nasal Congestion     (Consider location/radiation/quality/duration/timing/severity/associated sxs/prior Treatment) Patient is a 70 y.o. female presenting with pharyngitis. The history is provided by the patient. No language interpreter was used.  Sore Throat This is a new problem. The current episode started in the past 7 days. The problem occurs constantly. The problem has been gradually worsening. Associated symptoms include headaches and a sore throat. Nothing aggravates the symptoms. She has tried nothing for the symptoms. The treatment provided moderate relief.    Past Medical History  Diagnosis Date  . Anxiety   . Sleep apnea   . Hypothyroidism   . NSTEMI (non-ST elevated myocardial infarction) ([place])     a.  Related to occlusion of the distal Cx. The mid to distal obtuse marginal #1 also contains a significant stenosis that is best treated with medical therapy. Patent RCA/LAD with tortuosity. LM patent. EF 55%.   [name] History of echocardiogram     a. 2D ECHO: 09/21/2013; EF 55-60%; mild concentric hypertrophy, G1DD, mild LA dilation, PA pressure 37. Small RV pericardial effusion  . Heart attack ([place])   . Hypertension   . Thyroid disease    Past Surgical History  Procedure Laterality Date  . Thyroidectomy  10/08/2011    Procedure: THYROIDECTOMY;  Surgeon: [name], MD;  Location: [facility];  Service: General;  Laterality: N/A;  Total  . Cardiac catheterization  ~ 2000  . Coronary angioplasty  09/20/2013  . Vaginal hysterectomy  1993  . Left heart catheterization with coronary angiogram N/A 09/20/2013    Procedure: LEFT HEART CATHETERIZATION WITH CORONARY ANGIOGRAM;  Surgeon: Sinclair Grooms, MD;  Location[facility] C[facility] LAB;  Service: Cardiovascular;  Laterality: N/A;  . Left heart catheterization with coronary angiogram  N/A 09/27/2013    Procedure: LEFT HEART CATHETERIZATION WITH CORONARY ANGIOGRAM;  Surgeon: Blane Ohara, MD;  Location: [facility] CATH LAB;  Service: Cardiovascular;  Laterality: N/A;  . Abdominal hysterectomy     No family history on file. Social History  Substance Use Topics  . Smoking status: Former Research scientist (life sciences)  . Smokeless tobacco: Never Used  . Alcohol Use: No   OB History    Gravida Para Term Preterm AB TAB SAB Ectopic Multiple Living   2 2 2  0 0 0 0 0       Review of Systems  HENT: Positive for sore throat.   Neurological: Positive for headaches.  All other systems reviewed and are negative.     Allergies  Amlodipine; Lisinopril; and Ranexa  Home Medications   Prior to Admission medications   Medication Sig Start Date End Date Taking? Authorizing Provider  ALPRAZolam Duanne Moron) 0.5 MG tablet Take 0.5 mg by mouth as needed for anxiety.     Historical Provider, MD  amoxicillin-clavulanate (AUGMENTIN) 875-125 MG tablet Take 1 tablet by mouth every 12 (twelve) hours. 06/15/15   Fransico Meadow, PA-C  Ascorbic Acid (VITAMIN C) 1000 MG tablet Take 1,000 mg by mouth daily.    Historical Provider, MD  aspirin 81 MG tablet Take 81 mg by mouth daily as needed (heart).     Historical Provider, MD  ezetimibe (ZETIA) 10 MG tablet Take 1 tablet (10 mg total) by mouth daily. 09/20/14   Herminio Commons, MD  levothyroxine (SYNTHROID, LEVOTHROID) 100 MCG  tablet Take 100 mcg by mouth daily.    Historical Provider, MD  metoprolol succinate (TOPROL-XL) 50 MG 24 hr tablet Take 1 tablet (50 mg total) by mouth daily. Take with or immediately following a meal. 02/18/15   Herminio Commons, MD  Multiple Vitamins-Minerals (MULTIVITAMINS THER. W/MINERALS) TABS tablet Take 1 tablet by mouth daily.    Historical Provider, MD  nitroGLYCERIN (NITROSTAT) 0.4 MG SL tablet Place 1 tablet (0.4 mg total) under the tongue every 5 (five) minutes x 3 doses as needed for chest pain. 06/03/14   Herminio Commons, MD   Pitavastatin Calcium 2 MG TABS Take 1 tablet (2 mg total) by mouth daily. 03/14/15   Herminio Commons, MD   BP 156/84 mmHg  Pulse 66  Temp(Src) 98.1 F (36.7 C) (Oral)  Resp 18  Ht 5\' 3"  (1.6 m)  Wt 77.111 kg  BMI 30.12 kg/m2  SpO2 100% Physical Exam  Constitutional: She is oriented to person, place, and time. She appears well-developed and well-nourished.  HENT:  Head: Normocephalic and atraumatic.  Mouth/Throat: Posterior oropharyngeal erythema present.  Tender maxillary sinuses,  Eyes: Conjunctivae and EOM are normal. Pupils are equal, round, and reactive to light.  Neck: Normal range of motion. Neck supple.  Cardiovascular: Normal heart sounds.   Pulmonary/Chest: Effort normal.  Abdominal: Soft. She exhibits no distension.  Musculoskeletal: Normal range of motion.  Neurological: She is alert and oriented to person, place, and time.  Skin: Skin is warm and dry.  Psychiatric: She has a normal mood and affect.  Nursing note and vitals reviewed.   ED Course  Procedures (including critical care time) Labs Review Labs Reviewed - No data to display  Imaging Review No results found. I have personally reviewed and evaluated these images and lab results as part of my medical decision-making.   EKG Interpretation None      MDM   Final diagnoses:  Subacute maxillary sinusitis   Meds ordered this encounter  Medications  . amoxicillin-clavulanate (AUGMENTIN) 875-125 MG tablet    Sig: Take 1 tablet by mouth every 12 (twelve) hours.    Dispense:  14 tablet    Refill:  0    Order Specific Question:  Supervising Provider    Answer:  Noemi Chapel [[id number]]   An After Visit Summary was printed and given to the patient.    Hollace Kinnier Pillager, PA-C 06/15/15 [place], MD 06/15/15 1341

## 2015-08-18 DIAGNOSIS — Z8249 Family history of ischemic heart disease and other diseases of the circulatory system: Secondary | ICD-10-CM | POA: Diagnosis not present

## 2015-08-18 DIAGNOSIS — I10 Essential (primary) hypertension: Secondary | ICD-10-CM | POA: Diagnosis not present

## 2015-08-18 DIAGNOSIS — R079 Chest pain, unspecified: Secondary | ICD-10-CM | POA: Diagnosis not present

## 2015-08-18 DIAGNOSIS — Z79899 Other long term (current) drug therapy: Secondary | ICD-10-CM | POA: Diagnosis not present

## 2015-08-18 DIAGNOSIS — I209 Angina pectoris, unspecified: Secondary | ICD-10-CM | POA: Diagnosis not present

## 2015-08-19 DIAGNOSIS — I10 Essential (primary) hypertension: Secondary | ICD-10-CM | POA: Diagnosis not present

## 2015-08-19 DIAGNOSIS — R079 Chest pain, unspecified: Secondary | ICD-10-CM | POA: Diagnosis not present

## 2015-08-20 ENCOUNTER — Encounter: Payer: Self-pay | Admitting: *Deleted

## 2015-08-21 ENCOUNTER — Encounter: Payer: Self-pay | Admitting: Cardiology

## 2015-08-21 ENCOUNTER — Encounter: Payer: Self-pay | Admitting: *Deleted

## 2015-08-21 ENCOUNTER — Ambulatory Visit (INDEPENDENT_AMBULATORY_CARE_PROVIDER_SITE_OTHER): Payer: Commercial Managed Care - HMO | Admitting: Cardiology

## 2015-08-21 VITALS — BP 171/104 | HR 62 | Ht 63.0 in | Wt 179.8 lb

## 2015-08-21 DIAGNOSIS — R079 Chest pain, unspecified: Secondary | ICD-10-CM

## 2015-08-21 DIAGNOSIS — I2510 Atherosclerotic heart disease of native coronary artery without angina pectoris: Secondary | ICD-10-CM | POA: Diagnosis not present

## 2015-08-21 DIAGNOSIS — I10 Essential (primary) hypertension: Secondary | ICD-10-CM
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
## 2015-08-21 NOTE — Patient Instructions (Signed)
Your physician recommends that you schedule a follow-up appointment in: 3-4 [place]  Your physician recommends that you continue on your current medications as directed. Please refer to the Current Medication list given to you today.  Your physician has requested that you have en exercise stress myoview. For further information please visit [email or web address]. Please follow instruction sheet, as given.  Thank you for choosing [facility]!!

## 2015-08-27 ENCOUNTER — Encounter: Payer: Self-pay | Admitting: Cardiology

## 2015-08-28 DIAGNOSIS — I10 Essential (primary) hypertension: Secondary | ICD-10-CM | POA: Diagnosis not present

## 2015-08-28 DIAGNOSIS — F411 Generalized anxiety disorder: Secondary | ICD-10-CM | POA: Diagnosis not present

## 2015-08-28 DIAGNOSIS — E039 Hypothyroidism, unspecified: Secondary | ICD-10-CM | POA: Diagnosis not present

## 2015-08-28 DIAGNOSIS — I2510 Atherosclerotic heart disease of native coronary artery without angina pectoris: Secondary | ICD-10-CM | POA: Diagnosis not present

## 2015-08-29 ENCOUNTER — Telehealth: Payer: Self-pay | Admitting: Cardiovascular Disease

## 2015-08-29 NOTE — Telephone Encounter (Signed)
Came by the office to talk  About why she has not scheduled her test yet.  Asked that nurse call her next week

## 2015-09-01 NOTE — Telephone Encounter (Signed)
Left message to return call 

## 2015-09-02 NOTE — Telephone Encounter (Signed)
Spoke with patient yesterday.  Stated she has just gotten a new job & is trying to come up with day she can do the stress test.  Is having to do some things with her son & trying to schedule everything in one day.  She will call back soon to schedule.

## 2015-09-09 ENCOUNTER — Telehealth: Payer: Self-pay | Admitting: Cardiovascular Disease

## 2015-09-09 NOTE — Telephone Encounter (Signed)
Mrs. Stallard called wanting to know if Dr. Bronson Ing would order labs to check her cholesterol.

## 2015-09-10 NOTE — Telephone Encounter (Signed)
Last Lipid panel we have on file is August 2016.

## 2015-09-11 NOTE — Telephone Encounter (Signed)
Ok to repeat if PCP has not done so within past 12 months.

## 2015-09-11 NOTE — Telephone Encounter (Signed)
Left message to return call 

## 2015-09-15 NOTE — Telephone Encounter (Signed)
Attempted to return call again.  Left information below on voice mail.

## 2015-09-18 ENCOUNTER — Other Ambulatory Visit: Payer: Self-pay | Admitting: *Deleted

## 2015-09-18 DIAGNOSIS — R079 Chest pain, unspecified: Secondary | ICD-10-CM

## 2015-09-24 ENCOUNTER — Ambulatory Visit (HOSPITAL_COMMUNITY)
Admission: RE | Admit: 2015-09-24 | Discharge: 2015-09-24 | Disposition: A | Discharge status: Home / Self Care | Payer: Commercial Managed Care - HMO | Source: Ambulatory Visit | Attending: Internal Medicine | Admitting: Internal Medicine

## 2015-09-24 ENCOUNTER — Encounter (HOSPITAL_COMMUNITY)
Admission: RE | Admit: 2015-09-24 | Discharge: 2015-09-24 | Disposition: A | Discharge status: Home / Self Care | Payer: Commercial Managed Care - HMO | Source: Ambulatory Visit | Attending: Cardiology | Admitting: Cardiology

## 2015-09-24 ENCOUNTER — Other Ambulatory Visit (HOSPITAL_COMMUNITY): Payer: Self-pay | Admitting: Internal Medicine

## 2015-09-24 ENCOUNTER — Encounter (HOSPITAL_BASED_OUTPATIENT_CLINIC_OR_DEPARTMENT_OTHER)
Admission: RE | Admit: 2015-09-24 | Discharge: 2015-09-24 | Disposition: A | Discharge status: Home / Self Care | Payer: Commercial Managed Care - HMO | Source: Ambulatory Visit

## 2015-09-24 ENCOUNTER — Other Ambulatory Visit: Payer: Self-pay | Admitting: Cardiology

## 2015-09-24 ENCOUNTER — Encounter (HOSPITAL_COMMUNITY): Payer: Self-pay

## 2015-09-24 DIAGNOSIS — R079 Chest pain, unspecified: Secondary | ICD-10-CM | POA: Diagnosis not present

## 2015-09-24 DIAGNOSIS — G4700 Insomnia, unspecified: Secondary | ICD-10-CM | POA: Diagnosis not present

## 2015-09-24 DIAGNOSIS — Z1231 Encounter for screening mammogram for malignant neoplasm of breast: Secondary | ICD-10-CM | POA: Diagnosis not present

## 2015-09-24 DIAGNOSIS — R0789 Other chest pain: Secondary | ICD-10-CM

## 2015-09-24 DIAGNOSIS — J309 Allergic rhinitis, unspecified: Secondary | ICD-10-CM | POA: Diagnosis not present

## 2015-09-24 DIAGNOSIS — R739 Hyperglycemia, unspecified: Secondary | ICD-10-CM | POA: Diagnosis not present

## 2015-09-24 DIAGNOSIS — E039 Hypothyroidism, unspecified: Secondary | ICD-10-CM | POA: Diagnosis not present

## 2015-09-24 DIAGNOSIS — I10 Essential (primary) hypertension: Secondary | ICD-10-CM | POA: Diagnosis not present

## 2015-09-24 DIAGNOSIS — E669 Obesity, unspecified: Secondary | ICD-10-CM | POA: Diagnosis not present

## 2015-09-24 DIAGNOSIS — I2510 Atherosclerotic heart disease of native coronary artery without angina pectoris: Secondary | ICD-10-CM | POA: Diagnosis not present

## 2015-09-24 LAB — NM MYOCAR MULTI W/SPECT W/WALL MOTION / EF
CHL CUP MPHR: 151 {beats}/min
CHL CUP NUCLEAR SDS: 2
CHL CUP NUCLEAR SRS: 2
CHL CUP NUCLEAR SSS: 4
CHL CUP RESTING HR STRESS: 56 {beats}/min
CSEPED: 7 min
CSEPEDS: 15 s
CSEPHR: 90 %
CSEPPHR: 136 {beats}/min
Estimated workload: 10.1 METS
LHR: 0.43
LV dias vol: 58 mL (ref 46–106)
LV sys vol: 20 mL
RPE: 11
TID: 1.02

## 2015-09-24 MED ORDER — REGADENOSON 0.4 MG/5ML IV SOLN
INTRAVENOUS | Status: AC
  Filled 2015-09-24: qty 5

## 2015-09-24 MED ORDER — TECHNETIUM TC 99M TETROFOSMIN IV KIT
30.0000 | PACK | Freq: Once | INTRAVENOUS | Status: AC | PRN
  Administered 2015-09-24: 30 via INTRAVENOUS

## 2015-09-24 MED ORDER — SODIUM CHLORIDE 0.9% FLUSH
INTRAVENOUS | Status: AC
  Administered 2015-09-24: 10 mL via INTRAVENOUS
  Filled 2015-09-24: qty 10

## 2015-09-24 MED ORDER — TECHNETIUM TC 99M TETROFOSMIN IV KIT
10.0000 | PACK | Freq: Once | INTRAVENOUS | Status: AC | PRN
  Administered 2015-09-24: 10 via INTRAVENOUS

## 2015-10-02 ENCOUNTER — Telehealth: Payer: Self-pay | Admitting: *Deleted

## 2015-10-02 NOTE — Telephone Encounter (Signed)
Notes Recorded by Laurine Blazer, LPN on [id number] at [phone] PM EDT Left message to return call.

## 2015-10-06 NOTE — Telephone Encounter (Signed)
Patient informed. 

## 2015-10-07 DIAGNOSIS — I10 Essential (primary) hypertension: Secondary | ICD-10-CM | POA: Diagnosis not present

## 2015-10-07 DIAGNOSIS — I2510 Atherosclerotic heart disease of native coronary artery without angina pectoris: Secondary | ICD-10-CM | POA: Diagnosis not present

## 2015-10-07 DIAGNOSIS — J309 Allergic rhinitis, unspecified: Secondary | ICD-10-CM | POA: Diagnosis not present

## 2015-10-07 DIAGNOSIS — E039 Hypothyroidism, unspecified: Secondary | ICD-10-CM | POA: Diagnosis not present

## 2015-10-24 ENCOUNTER — Other Ambulatory Visit: Payer: Self-pay | Admitting: *Deleted

## 2015-10-24 NOTE — Patient Outreach (Signed)
[place] [facility]) Care Management  10/24/2015  [name] [name] Jul 05, 1945 [id number]  [employer] Tier 4 referral:  Telephone call to patient; left HIPPA compliant voice mail.   Plan: Will follow up .  Sherrin Daisy, RN BSN [place] Management Coordinator [facility] Care Management  [phone]

## 2015-10-28 ENCOUNTER — Other Ambulatory Visit: Payer: Self-pay | Admitting: *Deleted

## 2015-10-28 ENCOUNTER — Encounter: Payer: Self-pay | Admitting: *Deleted
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
## 2015-10-30 DIAGNOSIS — Z79899 Other long term (current) drug therapy: Secondary | ICD-10-CM | POA: Diagnosis not present

## 2015-10-30 DIAGNOSIS — M79671 Pain in right foot: Secondary | ICD-10-CM | POA: Diagnosis not present

## 2015-10-30 DIAGNOSIS — S20211A Contusion of right front wall of thorax, initial encounter: Secondary | ICD-10-CM | POA: Diagnosis not present

## 2015-10-30 DIAGNOSIS — I10 Essential (primary) hypertension: Secondary | ICD-10-CM | POA: Diagnosis not present

## 2015-10-30 DIAGNOSIS — S40021A Contusion of right upper arm, initial encounter: Secondary | ICD-10-CM | POA: Diagnosis not present

## 2015-10-30 DIAGNOSIS — I252 Old myocardial infarction: Secondary | ICD-10-CM | POA: Diagnosis not present

## 2015-10-30 DIAGNOSIS — E039 Hypothyroidism, unspecified: Secondary | ICD-10-CM | POA: Diagnosis not present

## 2015-10-30 DIAGNOSIS — W1839XA Other fall on same level, initial encounter: Secondary | ICD-10-CM | POA: Diagnosis not present

## 2015-11-13 DIAGNOSIS — R0789 Other chest pain: Secondary | ICD-10-CM | POA: Diagnosis not present

## 2015-11-13 DIAGNOSIS — S4991XA Unspecified injury of right shoulder and upper arm, initial encounter: Secondary | ICD-10-CM | POA: Diagnosis not present

## 2015-11-13 DIAGNOSIS — W19XXXA Unspecified fall, initial encounter: Secondary | ICD-10-CM | POA: Diagnosis not present

## 2015-11-14 ENCOUNTER — Ambulatory Visit: Payer: Self-pay | Admitting: Cardiovascular Disease

## 2015-12-02 ENCOUNTER — Ambulatory Visit (HOSPITAL_COMMUNITY): Payer: Commercial Managed Care - HMO | Attending: Internal Medicine | Admitting: Physical Therapy

## 2015-12-02 DIAGNOSIS — G8929 Other chronic pain: Secondary | ICD-10-CM | POA: Diagnosis not present

## 2015-12-02 DIAGNOSIS — M79604 Pain in right leg: Secondary | ICD-10-CM

## 2015-12-02 DIAGNOSIS — R262 Difficulty in walking, not elsewhere classified: Secondary | ICD-10-CM

## 2015-12-02 DIAGNOSIS — M25511 Pain in right shoulder: Secondary | ICD-10-CM | POA: Diagnosis not present

## 2015-12-02 NOTE — Therapy (Signed)
[place], [place], [address] Phone: [phone]   Fax:  [phone]  Physical Therapy Evaluation  Patient Details  Name: [name] [name] MRN: [id number] Date of Birth: 15-Jul-1945 Referring Provider: Rosita Fire  Encounter Date: 12/02/2015      PT End of Session - 12/02/15 1215    Visit Number 1   Number of Visits 8   Date for PT Re-Evaluation 01/01/16   Authorization Type [employer] medicare    Authorization - Visit Number 1   Authorization - Number of Visits 8   PT Start Time 0945   PT Stop Time 1030   PT Time Calculation (min) 45 min   Activity Tolerance Patient tolerated treatment well      Past Medical History:  Diagnosis Date  . Anxiety   . Heart attack   . History of echocardiogram    a. 2D ECHO: 09/21/2013; EF 55-60%; mild concentric hypertrophy, G1DD, mild LA dilation, PA pressure 37. Small RV pericardial effusion  . Hypertension   . Hypothyroidism   . NSTEMI (non-ST elevated myocardial infarction) ([place])    a.  Related to occlusion of the distal Cx. The mid to distal obtuse marginal #1 also contains a significant stenosis that is best treated with medical therapy. Patent RCA/LAD with tortuosity. LM patent. EF 55%.   . Sleep apnea   . Thyroid disease     Past Surgical History:  Procedure Laterality Date  . ABDOMINAL HYSTERECTOMY    . CARDIAC CATHETERIZATION  ~ 2000  . CORONARY ANGIOPLASTY  09/20/2013  . LEFT HEART CATHETERIZATION WITH CORONARY ANGIOGRAM N/A 09/20/2013   Procedure: LEFT HEART CATHETERIZATION WITH CORONARY ANGIOGRAM;  Surgeon: Sinclair Grooms, MD;  Location: [facility] CATH LAB;  Service: Cardiovascular;  Laterality: N/A;  . LEFT HEART CATHETERIZATION WITH CORONARY ANGIOGRAM N/A 09/27/2013   Procedure: LEFT HEART CATHETERIZATION WITH CORONARY ANGIOGRAM;  Surgeon: Blane Ohara, MD;  Location: [facility] CATH LAB;  Service: Cardiovascular;  Laterality: N/A;  . THYROIDECTOMY  10/08/2011    Procedure: THYROIDECTOMY;  Surgeon: [name], MD;  Location: [facility] ORS;  Service: General;  Laterality: N/A;  Total  . VAGINAL HYSTERECTOMY  1993    There were no vitals filed for this visit.       Subjective Assessment - 12/02/15 0952    Subjective Ms. [name] [name] is a 70 yo female who states that she fell in a shopping store on 10/30/2015.  She went to the ER and had x-ray of her shoulder, foot and ribs all of which were negative.  She states that she is still having pain in her whole right side including her shoulder, ribs she states that she has pain and tingling in her leg.   She states that she hurts everyday she takes flexoril, hydrocodone.      How long can you sit comfortably? four to five minutes (Pt is sitting during interview for over fifteen minutes.)   How long can you stand comfortably? able to stands bent over.    How long can you walk comfortably? She is able to walk but she has to sit down.  Pt was unable to give therapist a time that she can walk for before she sits down.    Patient Stated Goals decrease pain, get back to walking and riding her bike.    Currently in Pain? Yes   Pain Score 9    Pain Location Shoulder   Pain Orientation Right   Pain  Descriptors / Indicators Aching   Pain Type Chronic pain   Pain Onset More than a month ago   Pain Frequency Constant   Aggravating Factors  activity   Pain Relieving Factors medication    Effect of Pain on Daily Activities increases    Pain Score 9   Pain Location Rib cage   Pain Orientation Right   Pain Descriptors / Indicators Nagging   Pain Type Chronic pain   Pain Onset More than a month ago   Aggravating Factors  moving    Pain Relieving Factors medication    Effect of Pain on Daily Activities activity    Pain Score 8   Pain Location Leg   Pain Orientation Right   Pain Descriptors / Indicators Aching   Pain Type Chronic pain   Pain Radiating Towards foot    Pain Onset More than a month ago   Pain Frequency  Intermittent   Aggravating Factors  no idea   Pain Relieving Factors shoe with support    Effect of Pain on Daily Activities increases            [facility] PT Assessment - 12/02/15 0001      Assessment   Medical Diagnosis Rt shoulder, rib, leg and foot pain    Referring Provider Tesfaye Fanta   Onset Date/Surgical Date 10/30/15   Next MD Visit 12/30/2015   Prior Therapy none      Precautions   Precautions None     Restrictions   Weight Bearing Restrictions No     Balance Screen   Has the patient fallen in the past 6 months Yes   How many times? 1   Has the patient had a decrease in activity level because of a fear of falling?  Yes   Is the patient reluctant to leave their home because of a fear of falling?  No     Home Environment   Living Environment Private residence   Type of [place] Access Level entry     Prior Function   Level of Independence Independent   Vocation Part time employment   Vocation Requirements standing: works at [employer], biking      Cognition   Overall Cognitive Status Within Functional Limits for tasks assessed     Observation/Other Assessments-Edema    Edema --  Pt holds Rt UE in a protective position.      Functional Tests   Functional tests Sit to Stand     Sit to Stand   Comments 5x in 40.1       ROM / Strength   AROM / PROM / Strength AROM     AROM   AROM Assessment Site Shoulder   Right/Left Shoulder Right   Right Shoulder Flexion 100 Degrees   Right Shoulder ABduction 50 Degrees  note: able to place at 90 for ER   Right Shoulder Internal Rotation 70 Degrees   Right Shoulder External Rotation 70 Degrees                   [facility] Adult PT Treatment/Exercise - 12/02/15 0001      Exercises   Exercises Shoulder;Lumbar     Lumbar Exercises: Stretches   Single Knee to Chest Stretch 3 reps;30 seconds     Shoulder Exercises: Supine   External Rotation Limitations combined with IR x 5     Internal Rotation Limitations --  wand 1# dowel x 5  ABduction --  1# dowel x 10                PT Education - 12/02/15 1024    Education provided Yes   Education Details shoulder ROM and breathing    Person(s) Educated Patient   Methods Explanation   Comprehension Verbalized understanding;Returned demonstration          PT Short Term Goals - 12/02/15 1222      PT SHORT TERM GOAL #1   Title Pt Rt shoulder flexion to be to 150 degrees to allow pt to reach into upper cabinets with ease.    Time 2   Period Weeks   Status New     PT SHORT TERM GOAL #2   Title Pt Rt body pain to be no greater than a 6/10 to allow pt to sleep at least four hours a night.    Time 2   Period Weeks   Status New     PT SHORT TERM GOAL #3   Title Pt to be able to sit in comfort for 30 minutes    Time 2   Period Weeks   Status New     PT SHORT TERM GOAL #4   Title Pt to be able to walk for 30 minutes without having increased Rt leg pain    Time 2   Period Weeks   Status New           PT Long Term Goals - 12/02/15 1224      PT LONG TERM GOAL #1   Title Pt overal Rt sided pain to be no greater than a 4/10 to allow pt to be able to walk for up to 45 minutes at a time for community activity    Time 4   Period Weeks   Status New     PT LONG TERM GOAL #2   Title Pt to be able to complete housework without increased Rt shoulder pain    Time 8   Status New     PT LONG TERM GOAL #3   Title Pt to have started working again    Time 4   Period Weeks   Status New               Plan - 12/02/15 1216    Clinical Impression Statement Ms. Waker is a 70 yo female who states that she fell at a department store on 10/30/2015.  She states that her pain is still a 9/10 for her Rt shoulder, and rib are with an 8/10 for her Rt leg.  All x-rays are negative.  Pt  has been referred to skilled physical therapy.  Evaluation demonstrates decreased ROM, decreased activity tolerance  and  increased pain.  Ms. Fetcho will benefit from skillled physical therapy to address these issues and maximize her functional abililty.    Rehab Potential Good   PT Frequency 2x / week   PT Duration 4 weeks   PT Treatment/Interventions ADLs/Self Care Home Management;Functional mobility training;Therapeutic activities;Therapeutic exercise;Patient/family education   PT Next Visit Plan begin pulley, wall walk,  sidelying  Rt shoulder abduction and UBE.  Hamstring stretching for leg pain.  Progress functional strength as pt is able to tolerate..    PT Home Exercise Plan wand exercises; knee to chest       Patient will benefit from skilled therapeutic intervention in order to improve the following deficits and impairments:  Decreased activity tolerance, Difficulty walking, Decreased range of  motion, Impaired perceived functional ability, Pain  Visit Diagnosis: Chronic right shoulder pain - Plan: PT plan of care cert/re-cert  Pain in right leg - Plan: PT plan of care cert/re-cert  Difficulty in walking, not elsewhere classified - Plan: PT plan of care cert/re-cert      G-Codes - [id number] [id number]    Functional Assessment Tool Used Clinical judgement: ROM and pain level    Functional Limitation Carrying, moving and handling objects   Carrying, Moving and Handling Objects Current Status [id number]) At least 40 percent but less than 60 percent impaired, limited or restricted   Carrying, Moving and Handling Objects Goal Status [id number]) At least 20 percent but less than 40 percent impaired, limited or restricted       Problem List Patient Active Problem List   Diagnosis Date Noted  . Chest pain 09/26/2013  . HTN (hypertension) 09/22/2013  . Hypothyroidism   . Sleep apnea   . Anxiety   . NSTEMI (non-ST elevated myocardial infarction) ([place]) 09/20/2013  . HLD (hyperlipidemia) 01/17/2008  . Essential hypertension 01/17/2008  . CORONARY ARTERY DISEASE 01/17/2008    Rayetta Humphrey, PT  CLT [phone] 12/02/2015, 12:57 PM  [facility] [address] [place], [place], [address] Phone: [phone]   Fax:  [phone]  Name: MCKAELA PHILIPP MRN: [id number] Date of Birth: July 11, 1945

## 2015-12-02 NOTE — Patient Instructions (Addendum)
ROM: Flexion - Wand (Supine)    Lie on back holding wand. Raise arms over head.  Repeat __10__ times per set. Do ___1_ sets per session. Do _3___ sessions per day.  http://orth.exer.us/928   Copyright  VHI. All rights reserved.  ROM: Abduction - Wand    Holding wand with left hand palm up, push wand directly out to side, leading with other hand palm down, until stretch is felt. Hold _3___ seconds. Repeat _10___ times per set. Do 1____ sets per session. Do ___3 sessions per day.  http://orth.exer.us/746   Copyright  VHI. All rights reserved.  ROM: External / Internal Rotation - in Abduction (Standing)   While on your back.   Right upper arm parallel to floor and elbows bent at right angles, gently rotate arms up then down as far as possible without pain. Repeat __10__ times per set. Do _1___ sets per session. Do __3__ sessions per day.  http://orth.exer.us/912   Copyright  VHI. All rights reserved.  Belly    Place fingertips of both hands gently on abdomen. Take a slow breath in through nose and feel stomach expand, moving hands apart. Take ___ slow breaths.  Copyright  VHI. All rights reserved.  Knee-to-Chest Stretch: Unilateral    With hand behind right knee, pull knee in to chest until a comfortable stretch is felt in lower back and buttocks. Keep back relaxed. Hold __30__ seconds. Repeat __3__ times per set. Do ___1_ sets per session. Do ____ sessions per day. 2 http://orth.exer.us/126   Copyright  VHI. All rights reserved.

## 2015-12-09 ENCOUNTER — Ambulatory Visit (HOSPITAL_COMMUNITY): Payer: Commercial Managed Care - HMO

## 2015-12-09 DIAGNOSIS — M79604 Pain in right leg: Secondary | ICD-10-CM

## 2015-12-09 DIAGNOSIS — G8929 Other chronic pain: Secondary | ICD-10-CM | POA: Diagnosis not present

## 2015-12-09 DIAGNOSIS — M25511 Pain in right shoulder: Secondary | ICD-10-CM | POA: Diagnosis not present

## 2015-12-09 DIAGNOSIS — R262 Difficulty in walking, not elsewhere classified: Secondary | ICD-10-CM

## 2015-12-09 NOTE — Therapy (Signed)
[place], [place], [address] Phone: [phone]   Fax:  [phone]  Physical Therapy Treatment  Patient Details  Name: [name] [name] MRN: [id number] Date of Birth: 1945/10/11 Referring Provider: Rosita Fire  Encounter Date: 12/09/2015      PT End of Session - 12/09/15 0921    Visit Number 2   Number of Visits 8   Date for PT Re-Evaluation 01/01/16   Authorization Type [employer] medicare    Authorization - Visit Number 2   Authorization - Number of Visits 8   PT Start Time 0905   PT Stop Time 0943   PT Time Calculation (min) 38 min   Activity Tolerance Patient tolerated treatment well   Behavior During Therapy [facility] for tasks assessed/performed      Past Medical History:  Diagnosis Date  . Anxiety   . Heart attack   . History of echocardiogram    a. 2D ECHO: 09/21/2013; EF 55-60%; mild concentric hypertrophy, G1DD, mild LA dilation, PA pressure 37. Small RV pericardial effusion  . Hypertension   . Hypothyroidism   . NSTEMI (non-ST elevated myocardial infarction) ([place])    a.  Related to occlusion of the distal Cx. The mid to distal obtuse marginal #1 also contains a significant stenosis that is best treated with medical therapy. Patent RCA/LAD with tortuosity. LM patent. EF 55%.   . Sleep apnea   . Thyroid disease     Past Surgical History:  Procedure Laterality Date  . ABDOMINAL HYSTERECTOMY    . CARDIAC CATHETERIZATION  ~ 2000  . CORONARY ANGIOPLASTY  09/20/2013  . LEFT HEART CATHETERIZATION WITH CORONARY ANGIOGRAM N/A 09/20/2013   Procedure: LEFT HEART CATHETERIZATION WITH CORONARY ANGIOGRAM;  Surgeon: Sinclair Grooms, MD;  Location: [facility] CATH LAB;  Service: Cardiovascular;  Laterality: N/A;  . LEFT HEART CATHETERIZATION WITH CORONARY ANGIOGRAM N/A 09/27/2013   Procedure: LEFT HEART CATHETERIZATION WITH CORONARY ANGIOGRAM;  Surgeon: Blane Ohara, MD;  Location: [facility] CATH LAB;  Service:  Cardiovascular;  Laterality: N/A;  . THYROIDECTOMY  10/08/2011   Procedure: THYROIDECTOMY;  Surgeon: [name], MD;  Location: [facility] ORS;  Service: General;  Laterality: N/A;  Total  . VAGINAL HYSTERECTOMY  1993    There were no vitals filed for this visit.      Subjective Assessment - 12/09/15 0909    Subjective Pt stated she had increased back pain following last session, current pain scale 8/10 Rt shoulder, lower back and Rt hip   Patient Stated Goals decrease pain, get back to walking and riding her bike.    Currently in Pain? Yes   Pain Score 8    Pain Location Shoulder   Pain Orientation Right   Pain Descriptors / Indicators Aching   Pain Type Chronic pain   Pain Onset More than a month ago   Pain Frequency Constant   Aggravating Factors  activity   Pain Relieving Factors medication   Effect of Pain on Daily Activities increases   Pain Location Back   Pain Descriptors / Indicators Nagging   Pain Type Chronic pain   Pain Onset More than a month ago   Aggravating Factors  moving   Pain Relieving Factors medication   Effect of Pain on Daily Activities activity   Pain Score 8   Pain Location Leg   Pain Orientation Right   Pain Descriptors / Indicators Aching   Pain Type Chronic pain   Pain Radiating Towards  foot   Pain Onset More than a month ago   Pain Frequency Intermittent   Aggravating Factors  no idea   Pain Relieving Factors shoe with support   Effect of Pain on Daily Activities increases                         [facility] Adult PT Treatment/Exercise - 12/09/15 0001      Bed Mobility   Bed Mobility Left Sidelying to Sit   Left Sidelying to Sit 5: Supervision   Left Sidelying to Sit Details (indicate cue type and reason) Instructed proper form with bed mobility     Lumbar Exercises: Stretches   Active Hamstring Stretch 3 reps;30 seconds   Active Hamstring Stretch Limitations supine with rope   Single Knee to Chest Stretch 3 reps;30 seconds      Shoulder Exercises: Supine   Flexion 10 reps   Flexion Limitations 1# wand     Shoulder Exercises: Seated   Abduction 10 reps   ABduction Limitations 1# wand   Other Seated Exercises IR/ER 10x each     Shoulder Exercises: Sidelying   ABduction Right;10 reps;AROM     Shoulder Exercises: Standing   Other Standing Exercises Begin wall walking next session     Shoulder Exercises: Pulleys   Flexion Limitations Time, begin next session   ABduction Limitations Time, begin next session.     Shoulder Exercises: ROM/Strengthening   UBE (Upper Arm Bike) Time, begin next session                  PT Short Term Goals - 12/02/15 1222      PT SHORT TERM GOAL #1   Title Pt Rt shoulder flexion to be to 150 degrees to allow pt to reach into upper cabinets with ease.    Time 2   Period Weeks   Status New     PT SHORT TERM GOAL #2   Title Pt Rt body pain to be no greater than a 6/10 to allow pt to sleep at least four hours a night.    Time 2   Period Weeks   Status New     PT SHORT TERM GOAL #3   Title Pt to be able to sit in comfort for 30 minutes    Time 2   Period Weeks   Status New     PT SHORT TERM GOAL #4   Title Pt to be able to walk for 30 minutes without having increased Rt leg pain    Time 2   Period Weeks   Status New           PT Long Term Goals - 12/02/15 1224      PT LONG TERM GOAL #1   Title Pt overal Rt sided pain to be no greater than a 4/10 to allow pt to be able to walk for up to 45 minutes at a time for community activity    Time 4   Period Weeks   Status New     PT LONG TERM GOAL #2   Title Pt to be able to complete housework without increased Rt shoulder pain    Time 8   Status New     PT LONG TERM GOAL #3   Title Pt to have started working again    Time 4   Period Weeks   Status New  Plan - 12/09/15 0924    Clinical Impression Statement Reviewed goals, instructed correct form and technique with current HEP  and copy of eval given to pt.  Pt c/o increased pain and cramping of lower back during [facility].  Pt instructed proper bed mobility and encouraged to begin at home.  Therapist facilitation required through session for proper form and technqiue with therex.  Pt limited by pain through session.   Rehab Potential Good   PT Frequency 2x / week   PT Duration 4 weeks   PT Treatment/Interventions ADLs/Self Care Home Management;Functional mobility training;Therapeutic activities;Therapeutic exercise;Patient/family education   PT Next Visit Plan begin pulley, wall walk,  sidelying  Rt shoulder abduction.  Hamstring stretching for leg pain.  Progress functional strength as pt is able to tolerate..    PT Home Exercise Plan wand exercises; knee to chest       Patient will benefit from skilled therapeutic intervention in order to improve the following deficits and impairments:  Decreased activity tolerance, Difficulty walking, Decreased range of motion, Impaired perceived functional ability, Pain  Visit Diagnosis: Chronic right shoulder pain  Pain in right leg  Difficulty in walking, not elsewhere classified     Problem List Patient Active Problem List   Diagnosis Date Noted  . Chest pain 09/26/2013  . HTN (hypertension) 09/22/2013  . Hypothyroidism   . Sleep apnea   . Anxiety   . NSTEMI (non-ST elevated myocardial infarction) ([place]) 09/20/2013  . HLD (hyperlipidemia) 01/17/2008  . Essential hypertension 01/17/2008  . CORONARY ARTERY DISEASE 01/17/2008   Ihor Austin, [place]; [place]  Aldona Lento 12/09/2015, 12:15 PM  [place], [place], [address] Phone: [phone]   Fax:  [phone]  Name: [name] [name] MRN: [id number] Date of Birth: 06-Sep-1945

## 2015-12-10 ENCOUNTER — Ambulatory Visit: Payer: Self-pay | Admitting: Physician Assistant

## 2015-12-11 ENCOUNTER — Ambulatory Visit (HOSPITAL_COMMUNITY): Payer: Commercial Managed Care - HMO | Attending: Internal Medicine | Admitting: Physical Therapy

## 2015-12-11 DIAGNOSIS — M79604 Pain in right leg: Secondary | ICD-10-CM | POA: Insufficient documentation

## 2015-12-11 DIAGNOSIS — M25511 Pain in right shoulder: Secondary | ICD-10-CM | POA: Insufficient documentation

## 2015-12-11 DIAGNOSIS — G8929 Other chronic pain: Secondary | ICD-10-CM | POA: Insufficient documentation

## 2015-12-11 DIAGNOSIS — R262 Difficulty in walking, not elsewhere classified: Secondary | ICD-10-CM | POA: Insufficient documentation

## 2015-12-11 NOTE — Therapy (Signed)
[place], [place], [address] Phone: [phone]   Fax:  [phone]  Physical Therapy Treatment  Patient Details  Name: [name] [name] MRN: [id number] Date of Birth: Oct 26, 1945 Referring Provider: Rosita Fire  Encounter Date: 12/11/2015      PT End of Session - 12/11/15 0931    Visit Number 3   Number of Visits 8   Date for PT Re-Evaluation 01/01/16   Authorization Type [employer] medicare    Authorization - Visit Number 3   Authorization - Number of Visits 8   PT Start Time [phone]  pt arrived late    PT Stop Time 0941   PT Time Calculation (min) 31 min   Activity Tolerance Patient limited by pain   Behavior During Therapy [facility] for tasks assessed/performed      Past Medical History:  Diagnosis Date  . Anxiety   . Heart attack   . History of echocardiogram    a. 2D ECHO: 09/21/2013; EF 55-60%; mild concentric hypertrophy, G1DD, mild LA dilation, PA pressure 37. Small RV pericardial effusion  . Hypertension   . Hypothyroidism   . NSTEMI (non-ST elevated myocardial infarction) ([place])    a.  Related to occlusion of the distal Cx. The mid to distal obtuse marginal #1 also contains a significant stenosis that is best treated with medical therapy. Patent RCA/LAD with tortuosity. LM patent. EF 55%.   . Sleep apnea   . Thyroid disease     Past Surgical History:  Procedure Laterality Date  . ABDOMINAL HYSTERECTOMY    . CARDIAC CATHETERIZATION  ~ 2000  . CORONARY ANGIOPLASTY  09/20/2013  . LEFT HEART CATHETERIZATION WITH CORONARY ANGIOGRAM N/A 09/20/2013   Procedure: LEFT HEART CATHETERIZATION WITH CORONARY ANGIOGRAM;  Surgeon: Sinclair Grooms, MD;  Location: [facility] CATH LAB;  Service: Cardiovascular;  Laterality: N/A;  . LEFT HEART CATHETERIZATION WITH CORONARY ANGIOGRAM N/A 09/27/2013   Procedure: LEFT HEART CATHETERIZATION WITH CORONARY ANGIOGRAM;  Surgeon: Blane Ohara, MD;  Location: [facility] CATH LAB;  Service:  Cardiovascular;  Laterality: N/A;  . THYROIDECTOMY  10/08/2011   Procedure: THYROIDECTOMY;  Surgeon: [name], MD;  Location: [facility] ORS;  Service: General;  Laterality: N/A;  Total  . VAGINAL HYSTERECTOMY  1993    There were no vitals filed for this visit.      Subjective Assessment - 12/11/15 0914    Subjective Pt reports that she is doing ok. She does what she can of her exrcises, but some seem to increase her pain.    Patient Stated Goals decrease pain, get back to walking and riding her bike.    Currently in Pain? Yes   Pain Score --  pt going on about taking pain medication but feels pretty good today.    Pain Type --   Pain Onset --   Pain Onset --                         [facility] Adult PT Treatment/Exercise - 12/11/15 0001      Lumbar Exercises: Supine   Other Supine Lumbar Exercises lower trunk rotation x10 reps    Other Supine Lumbar Exercises SKTC 5x10 sec each      Shoulder Exercises: ROM/Strengthening   Other ROM/Strengthening Exercises PROM into ABD/Flexion 10x5 sec each    Other ROM/Strengthening Exercises AAROM with 1# bar, x15, 3 sec hold  PT Education - 12/11/15 [phone]    Education provided Yes   Education Details disucssed importance of questioning to ensure better understanding of symptoms, despite pt's annoyance; importance of    Person(s) Educated Patient   Methods Explanation   Comprehension Verbalized understanding          PT Short Term Goals - 12/02/15 1222      PT SHORT TERM GOAL #1   Title Pt Rt shoulder flexion to be to 150 degrees to allow pt to reach into upper cabinets with ease.    Time 2   Period Weeks   Status New     PT SHORT TERM GOAL #2   Title Pt Rt body pain to be no greater than a 6/10 to allow pt to sleep at least four hours a night.    Time 2   Period Weeks   Status New     PT SHORT TERM GOAL #3   Title Pt to be able to sit in comfort for 30 minutes    Time 2   Period Weeks    Status New     PT SHORT TERM GOAL #4   Title Pt to be able to walk for 30 minutes without having increased Rt leg pain    Time 2   Period Weeks   Status New           PT Long Term Goals - 12/02/15 1224      PT LONG TERM GOAL #1   Title Pt overal Rt sided pain to be no greater than a 4/10 to allow pt to be able to walk for up to 45 minutes at a time for community activity    Time 4   Period Weeks   Status New     PT LONG TERM GOAL #2   Title Pt to be able to complete housework without increased Rt shoulder pain    Time 8   Status New     PT LONG TERM GOAL #3   Title Pt to have started working again    Time 4   Period Weeks   Status New               Plan - 12/11/15 0942    Clinical Impression Statement Today's session focused on PROM and gentle therex to improve muscle relaxation and guarding. Pt reporting feeling "pretty good" this morning after taking her pain medication, however walking into the clinic with noted antalgic pattern and with pain limited participation in all exercises and with manual techniques performed. Pt initially growing frustrated and defensive with therapist quesitoning about nature of her symptoms and therapist reassured pt that this was to gain a better understanding of the course of treatment. Ended session providing exercise/stretch [facility]) to perform at home with reasoning behind the exercise. Pt unable to confirm that she will adhere to the program at this time. Will continue with current POC.   Rehab Potential Good   PT Frequency 2x / week   PT Duration 4 weeks   PT Treatment/Interventions ADLs/Self Care Home Management;Functional mobility training;Therapeutic activities;Therapeutic exercise;Patient/family education   PT Next Visit Plan begin pulley, wall walk,  sidelying  Rt shoulder abduction.  Hamstring stretching for leg pain.  Progress functional strength as pt is able to tolerate..    PT Home Exercise Plan wand exercises; knee to  chest supine    Consulted and Agree with Plan of Care Patient      Patient will benefit  from skilled therapeutic intervention in order to improve the following deficits and impairments:  Decreased activity tolerance, Difficulty walking, Decreased range of motion, Impaired perceived functional ability, Pain  Visit Diagnosis: Chronic right shoulder pain  Pain in right leg  Difficulty in walking, not elsewhere classified     Problem List Patient Active Problem List   Diagnosis Date Noted  . Chest pain 09/26/2013  . HTN (hypertension) 09/22/2013  . Hypothyroidism   . Sleep apnea   . Anxiety   . NSTEMI (non-ST elevated myocardial infarction) ([place]) 09/20/2013  . HLD (hyperlipidemia) 01/17/2008  . Essential hypertension 01/17/2008  . CORONARY ARTERY DISEASE 01/17/2008    9:59 AM,12/11/15 Elly Modena PT, DPT [facility] [address] [place], [place], [address] Phone: [phone]   Fax:  [phone]  Name: [name] [name] MRN: [id number] Date of Birth: 01/20/1946

## 2015-12-15 ENCOUNTER — Ambulatory Visit (INDEPENDENT_AMBULATORY_CARE_PROVIDER_SITE_OTHER): Payer: Commercial Managed Care - HMO | Admitting: Otolaryngology

## 2015-12-15 DIAGNOSIS — J330 Polyp of nasal cavity: Secondary | ICD-10-CM

## 2015-12-15 DIAGNOSIS — R430 Anosmia: Secondary | ICD-10-CM

## 2015-12-15 DIAGNOSIS — J310 Chronic rhinitis: Secondary | ICD-10-CM

## 2015-12-16 ENCOUNTER — Ambulatory Visit (HOSPITAL_COMMUNITY): Payer: Commercial Managed Care - HMO

## 2015-12-16 DIAGNOSIS — R262 Difficulty in walking, not elsewhere classified: Secondary | ICD-10-CM | POA: Diagnosis not present

## 2015-12-16 DIAGNOSIS — M79604 Pain in right leg: Secondary | ICD-10-CM

## 2015-12-16 DIAGNOSIS — G8929 Other chronic pain: Secondary | ICD-10-CM | POA: Diagnosis not present

## 2015-12-16 DIAGNOSIS — M25511 Pain in right shoulder: Principal | ICD-10-CM

## 2015-12-16 NOTE — Therapy (Signed)
[place], [place], [address] Phone: [phone]   Fax:  [phone]  Physical Therapy Treatment  Patient Details  Name: [name] [name] MRN: [id number] Date of Birth: [birth year] Referring Provider: Rosita Fire  Encounter Date: 12/16/2015      PT End of Session - 12/16/15 0911    Visit Number 4   Number of Visits 8   Date for PT Re-Evaluation 01/01/16   Authorization Type [employer]    Authorization - Visit Number 4   Authorization - Number of Visits 8   PT Start Time 0903   PT Stop Time 0943   PT Time Calculation (min) 40 min   Behavior During Therapy [facility] for tasks assessed/performed      Past Medical History:  Diagnosis Date  . Anxiety   . Heart attack   . History of echocardiogram    a. 2D ECHO: 09/21/2013; EF 55-60%; mild concentric hypertrophy, G1DD, mild LA dilation, PA pressure 37. Small RV pericardial effusion  . Hypertension   . Hypothyroidism   . NSTEMI (non-ST elevated myocardial infarction) ([place])    a.  Related to occlusion of the distal Cx. The mid to distal obtuse marginal #1 also contains a significant stenosis that is best treated with medical therapy. Patent RCA/LAD with tortuosity. LM patent. EF 55%.   . Sleep apnea   . Thyroid disease     Past Surgical History:  Procedure Laterality Date  . ABDOMINAL HYSTERECTOMY    . CARDIAC CATHETERIZATION  ~ 2000  . CORONARY ANGIOPLASTY  09/20/2013  . LEFT HEART CATHETERIZATION WITH CORONARY ANGIOGRAM N/A 09/20/2013   Procedure: LEFT HEART CATHETERIZATION WITH CORONARY ANGIOGRAM;  Surgeon: Sinclair Grooms, MD;  Location: [facility] CATH LAB;  Service: Cardiovascular;  Laterality: N/A;  . LEFT HEART CATHETERIZATION WITH CORONARY ANGIOGRAM N/A 09/27/2013   Procedure: LEFT HEART CATHETERIZATION WITH CORONARY ANGIOGRAM;  Surgeon: Blane Ohara, MD;  Location: [facility] CATH LAB;  Service: Cardiovascular;  Laterality: N/A;  . THYROIDECTOMY  10/08/2011   Procedure: THYROIDECTOMY;  Surgeon: [name], MD;  Location: [facility];  Service: General;  Laterality: N/A;  Total  . VAGINAL HYSTERECTOMY  1993    There were no vitals filed for this visit.      Subjective Assessment - 12/16/15 0906    Subjective Pt stated she is feeling good today, reports compliance with HEP 1x daily.     Patient Stated Goals decrease pain, get back to walking and riding her bike.    Currently in Pain? No/denies  pain medication taken prior session                [facility] Adult PT Treatment/Exercise - 12/16/15 0001      Lumbar Exercises: Stretches   Active Hamstring Stretch 3 reps;30 seconds   Active Hamstring Stretch Limitations supine with rope   Single Knee to Chest Stretch 3 reps;30 seconds   Lower Trunk Rotation 5 reps;10 seconds     Lumbar Exercises: Supine   Other Supine Lumbar Exercises lower trunk rotation x10 reps    Other Supine Lumbar Exercises SKTC 5x10 sec each      Shoulder Exercises: Supine   Flexion 10 reps  13 dowel    ABduction 15 reps  1# dowel 15x   Other Supine Exercises PROM flexion and abduction     Shoulder Exercises: Pulleys   Flexion 1 minute   Flexion Limitations Rt shoulder ROM, focus on form  Wall walk 10x 5" holds  Pulleys for flexion and abduction 1 min each             PT Short Term Goals - 12/02/15 1222      PT SHORT TERM GOAL #1   Title Pt Rt shoulder flexion to be to 150 degrees to allow pt to reach into upper cabinets with ease.    Time 2   Period Weeks   Status New     PT SHORT TERM GOAL #2   Title Pt Rt body pain to be no greater than a 6/10 to allow pt to sleep at least four hours a night.    Time 2   Period Weeks   Status New     PT SHORT TERM GOAL #3   Title Pt to be able to sit in comfort for 30 minutes    Time 2   Period Weeks   Status New     PT SHORT TERM GOAL #4   Title Pt to be able to walk for 30 minutes without having increased Rt leg pain    Time 2   Period  Weeks   Status New           PT Long Term Goals - 12/02/15 1224      PT LONG TERM GOAL #1   Title Pt overal Rt sided pain to be no greater than a 4/10 to allow pt to be able to walk for up to 45 minutes at a time for community activity    Time 4   Period Weeks   Status New     PT LONG TERM GOAL #2   Title Pt to be able to complete housework without increased Rt shoulder pain    Time 8   Status New     PT LONG TERM GOAL #3   Title Pt to have started working again    Time 4   Period Weeks   Status New               Plan - 12/16/15 0935    Clinical Impression Statement Today's session foucs on improving Rt UE mobility wtih gentle therex.  Began AAROM therex with cueing to improve form and reduce guarding to assist with UE ROM.  Noted increased guarding with PROM vs AAROM, cueing to improve awareness of s/s and encouraged to voice if painful during any movement, pt stated fearful than painful.  Pt tolerated well towards session with no reports of increased pain through session.  Therapist facilitation was required for proper form through session.   Rehab Potential Good   PT Frequency 2x / week   PT Duration 4 weeks   PT Treatment/Interventions ADLs/Self Care Home Management;Functional mobility training;Therapeutic activities;Therapeutic exercise;Patient/family education   PT Next Visit Plan Continue to address shoulder ROM/mobility, next session begin sidelying  Rt shoulder abduction.  Hamstring stretching for leg pain.  Progress functional strength as pt is able to tolerate..    PT Home Exercise Plan wand exercises; knee to chest supine       Patient will benefit from skilled therapeutic intervention in order to improve the following deficits and impairments:  Decreased activity tolerance, Difficulty walking, Decreased range of motion, Impaired perceived functional ability, Pain  Visit Diagnosis: Chronic right shoulder pain  Pain in right leg  Difficulty in walking,  not elsewhere classified     Problem List Patient Active Problem List   Diagnosis Date Noted  . Chest pain 09/26/2013  .  HTN (hypertension) 09/22/2013  . Hypothyroidism   . Sleep apnea   . Anxiety   . NSTEMI (non-ST elevated myocardial infarction) ([place]) 09/20/2013  . HLD (hyperlipidemia) 01/17/2008  . Essential hypertension 01/17/2008  . CORONARY ARTERY DISEASE 01/17/2008   Ihor Austin, LPTA; [place]  Aldona Lento 12/16/2015, 10:21 AM  [place], [place], [address] Phone: [phone]   Fax:  [phone]  Name: AVALON ENGLEHART MRN: [id number] Date of Birth: [birth year]

## 2015-12-18 ENCOUNTER — Ambulatory Visit (HOSPITAL_COMMUNITY): Payer: Commercial Managed Care - HMO | Admitting: Physical Therapy

## 2015-12-18 DIAGNOSIS — G8929 Other chronic pain: Secondary | ICD-10-CM | POA: Diagnosis not present

## 2015-12-18 DIAGNOSIS — M79604 Pain in right leg: Secondary | ICD-10-CM | POA: Diagnosis not present

## 2015-12-18 DIAGNOSIS — M25511 Pain in right shoulder: Secondary | ICD-10-CM | POA: Diagnosis not present

## 2015-12-18 DIAGNOSIS — R262 Difficulty in walking, not elsewhere classified: Secondary | ICD-10-CM | POA: Diagnosis not present

## 2015-12-18 NOTE — Therapy (Signed)
[place], [place], [address] Phone: [phone]   Fax:  [phone]  Physical Therapy Treatment  Patient Details  Name: [name] [name] MRN: [id number] Date of Birth: 01-19-1946 Referring Provider: Rosita Fire  Encounter Date: 12/18/2015      PT End of Session - 12/18/15 0943    Visit Number 5   Number of Visits 8   Date for PT Re-Evaluation 01/01/16   Authorization Type [employer] medicare    Authorization - Visit Number 5   Authorization - Number of Visits 8   PT Start Time 0902   PT Stop Time [phone]   PT Time Calculation (min) 41 min   Activity Tolerance Patient limited by pain   Behavior During Therapy [facility] for tasks assessed/performed      Past Medical History:  Diagnosis Date  . Anxiety   . Heart attack   . History of echocardiogram    a. 2D ECHO: 09/21/2013; EF 55-60%; mild concentric hypertrophy, G1DD, mild LA dilation, PA pressure 37. Small RV pericardial effusion  . Hypertension   . Hypothyroidism   . NSTEMI (non-ST elevated myocardial infarction) ([place])    a.  Related to occlusion of the distal Cx. The mid to distal obtuse marginal #1 also contains a significant stenosis that is best treated with medical therapy. Patent RCA/LAD with tortuosity. LM patent. EF 55%.   . Sleep apnea   . Thyroid disease     Past Surgical History:  Procedure Laterality Date  . ABDOMINAL HYSTERECTOMY    . CARDIAC CATHETERIZATION  ~ 2000  . CORONARY ANGIOPLASTY  09/20/2013  . LEFT HEART CATHETERIZATION WITH CORONARY ANGIOGRAM N/A 09/20/2013   Procedure: LEFT HEART CATHETERIZATION WITH CORONARY ANGIOGRAM;  Surgeon: Sinclair Grooms, MD;  Location: [facility] CATH LAB;  Service: Cardiovascular;  Laterality: N/A;  . LEFT HEART CATHETERIZATION WITH CORONARY ANGIOGRAM N/A 09/27/2013   Procedure: LEFT HEART CATHETERIZATION WITH CORONARY ANGIOGRAM;  Surgeon: Blane Ohara, MD;  Location: [facility] CATH LAB;  Service: Cardiovascular;   Laterality: N/A;  . THYROIDECTOMY  10/08/2011   Procedure: THYROIDECTOMY;  Surgeon: [name], MD;  Location: [facility] ORS;  Service: General;  Laterality: N/A;  Total  . VAGINAL HYSTERECTOMY  1993    There were no vitals filed for this visit.      Subjective Assessment - 12/18/15 0910    Subjective Pt states her back is bothering her today;  shoulder is feeling alright    Patient Stated Goals decrease pain, get back to walking and riding her bike.    Currently in Pain? Yes   Pain Score 7    Pain Location Back   Pain Orientation Right   Pain Descriptors / Indicators Aching                         [facility] Adult PT Treatment/Exercise - 12/18/15 0001      Lumbar Exercises: Standing   Heel Raises 10 reps   Functional Squats 10 reps   Forward Lunge 10 reps   Side Lunge 10 reps   Wall Slides 10 reps   Wall Slides Limitations with ball   Other Standing Lumbar Exercises wall push up x 10     Lumbar Exercises: Quadruped   [place]/Old Horse 10 reps   Single Arm Raise Right;Left;5 reps   Straight Leg Raise 5 reps   Plank child pose x 3      Shoulder Exercises: Seated  Flexion Both;10 reps   Abduction 10 reps   Other Seated Exercises IR/ER 10x each   Other Seated Exercises 1#                   PT Short Term Goals - 12/18/15 0946      PT SHORT TERM GOAL #1   Title Pt Rt shoulder flexion to be to 150 degrees to allow pt to reach into upper cabinets with ease.    Time 2   Period Weeks   Status Achieved     PT SHORT TERM GOAL #2   Title Pt Rt body pain to be no greater than a 6/10 to allow pt to sleep at least four hours a night.    Time 2   Period Weeks   Status On-going     PT SHORT TERM GOAL #3   Title Pt to be able to sit in comfort for 30 minutes    Time 2   Period Weeks   Status On-going     PT SHORT TERM GOAL #4   Title Pt to be able to walk for 30 minutes without having increased Rt leg pain    Time 2   Period Weeks   Status On-going            PT Long Term Goals - 12/18/15 0946      PT LONG TERM GOAL #1   Title Pt overal Rt sided pain to be no greater than a 4/10 to allow pt to be able to walk for up to 45 minutes at a time for community activity    Time 4   Period Weeks   Status On-going     PT LONG TERM GOAL #2   Title Pt to be able to complete housework without increased Rt shoulder pain    Time 8   Status On-going     PT LONG TERM GOAL #3   Title Pt to have started working again    Time 4   Period Weeks   Status On-going               Plan - 12/18/15 0944    Clinical Impression Statement Pt demonstrates functional ROM of right shoulder in sitting position therefore will discontinue supine activity of shoulder.  Focused on core stability in functional activities for lumbar area today.   Multiple verbal and manual cuing needed to complete exercises correctly.     Rehab Potential Good   PT Frequency 2x / week   PT Duration 4 weeks   PT Treatment/Interventions ADLs/Self Care Home Management;Functional mobility training;Therapeutic activities;Therapeutic exercise;Patient/family education   PT Next Visit Plan Continue to address shoulder ROM/mobility,  Progress functional strength as pt is able to tolerate including standing abdcution with t-band    PT Home Exercise Plan wand exercises; knee to chest supine       Patient will benefit from skilled therapeutic intervention in order to improve the following deficits and impairments:  Decreased activity tolerance, Difficulty walking, Decreased range of motion, Impaired perceived functional ability, Pain  Visit Diagnosis: Chronic right shoulder pain  Pain in right leg  Difficulty in walking, not elsewhere classified     Problem List Patient Active Problem List   Diagnosis Date Noted  . Chest pain 09/26/2013  . HTN (hypertension) 09/22/2013  . Hypothyroidism   . Sleep apnea   . Anxiety   . NSTEMI (non-ST elevated myocardial infarction)  ([place]) 09/20/2013  . HLD (hyperlipidemia) 01/17/2008  .  Essential hypertension 01/17/2008  . CORONARY ARTERY DISEASE 01/17/2008    Rayetta Humphrey, PT CLT [id number] 12/18/2015, 9:47 AM  [place] [address] [place], [place], [address] Phone: [phone]   Fax:  [phone]  Name: JANEKA MONOHAN MRN: [id number] Date of Birth: Oct 31, 1945

## 2015-12-23 ENCOUNTER — Ambulatory Visit (HOSPITAL_COMMUNITY): Payer: Commercial Managed Care - HMO | Admitting: Physical Therapy

## 2015-12-23 ENCOUNTER — Other Ambulatory Visit (INDEPENDENT_AMBULATORY_CARE_PROVIDER_SITE_OTHER): Payer: Self-pay | Admitting: Otolaryngology

## 2015-12-23 DIAGNOSIS — M79604 Pain in right leg: Secondary | ICD-10-CM | POA: Diagnosis not present

## 2015-12-23 DIAGNOSIS — G8929 Other chronic pain: Secondary | ICD-10-CM | POA: Diagnosis not present

## 2015-12-23 DIAGNOSIS — M25511 Pain in right shoulder: Principal | ICD-10-CM

## 2015-12-23 DIAGNOSIS — R262 Difficulty in walking, not elsewhere classified: Secondary | ICD-10-CM

## 2015-12-23 DIAGNOSIS — J329 Chronic sinusitis, unspecified: Secondary | ICD-10-CM

## 2015-12-23 NOTE — Therapy (Signed)
[place], [place], [address] Phone: [phone]   Fax:  [phone]  Physical Therapy Treatment  Patient Details  Name: [name] [name] MRN: [id number] Date of Birth: 1945-11-09 Referring Provider: Rosita Fire  Encounter Date: 12/23/2015      PT End of Session - 12/23/15 0943    Visit Number 6   Number of Visits 8   Date for PT Re-Evaluation 01/01/16   Authorization Type [employer] medicare    Authorization - Visit Number 6   Authorization - Number of Visits 8   PT Start Time 0905   PT Stop Time 0945   PT Time Calculation (min) 40 min   Activity Tolerance Patient limited by pain   Behavior During Therapy [facility] for tasks assessed/performed      Past Medical History:  Diagnosis Date  . Anxiety   . Heart attack   . History of echocardiogram    a. 2D ECHO: 09/21/2013; EF 55-60%; mild concentric hypertrophy, G1DD, mild LA dilation, PA pressure 37. Small RV pericardial effusion  . Hypertension   . Hypothyroidism   . NSTEMI (non-ST elevated myocardial infarction) ([place])    a.  Related to occlusion of the distal Cx. The mid to distal obtuse marginal #1 also contains a significant stenosis that is best treated with medical therapy. Patent RCA/LAD with tortuosity. LM patent. EF 55%.   . Sleep apnea   . Thyroid disease     Past Surgical History:  Procedure Laterality Date  . ABDOMINAL HYSTERECTOMY    . CARDIAC CATHETERIZATION  ~ 2000  . CORONARY ANGIOPLASTY  09/20/2013  . LEFT HEART CATHETERIZATION WITH CORONARY ANGIOGRAM N/A 09/20/2013   Procedure: LEFT HEART CATHETERIZATION WITH CORONARY ANGIOGRAM;  Surgeon: Sinclair Grooms, MD;  Location: [facility] CATH LAB;  Service: Cardiovascular;  Laterality: N/A;  . LEFT HEART CATHETERIZATION WITH CORONARY ANGIOGRAM N/A 09/27/2013   Procedure: LEFT HEART CATHETERIZATION WITH CORONARY ANGIOGRAM;  Surgeon: Blane Ohara, MD;  Location: [facility] CATH LAB;  Service: Cardiovascular;   Laterality: N/A;  . THYROIDECTOMY  10/08/2011   Procedure: THYROIDECTOMY;  Surgeon: [name], MD;  Location: [facility] ORS;  Service: General;  Laterality: N/A;  Total  . VAGINAL HYSTERECTOMY  1993    There were no vitals filed for this visit.      Subjective Assessment - 12/23/15 0908    Subjective Pt statess she is hurting across her back and in her legs.  Shoulder is better than it was.  8/10 in lumbar region.   Currently in Pain? Yes   Pain Score 8    Pain Location Back   Pain Orientation Mid;Lower                         [facility] Adult PT Treatment/Exercise - 12/23/15 0001      Lumbar Exercises: Stretches   Active Hamstring Stretch 5 reps;20 seconds   Active Hamstring Stretch Limitations supine with rope     Lumbar Exercises: Standing   Heel Raises 10 reps   Functional Squats 10 reps   Forward Lunge 10 reps   Forward Lunge Limitations 4" box   Side Lunge 10 reps;Limitations   Wall Slides 10 reps   Wall Slides Limitations with ball     Shoulder Exercises: Seated   Flexion Both;10 reps   Abduction 10 reps   Other Seated Exercises IR/ER 10x each   Other Seated Exercises 2#  PT Short Term Goals - 12/18/15 0946      PT SHORT TERM GOAL #1   Title Pt Rt shoulder flexion to be to 150 degrees to allow pt to reach into upper cabinets with ease.    Time 2   Period Weeks   Status Achieved     PT SHORT TERM GOAL #2   Title Pt Rt body pain to be no greater than a 6/10 to allow pt to sleep at least four hours a night.    Time 2   Period Weeks   Status On-going     PT SHORT TERM GOAL #3   Title Pt to be able to sit in comfort for 30 minutes    Time 2   Period Weeks   Status On-going     PT SHORT TERM GOAL #4   Title Pt to be able to walk for 30 minutes without having increased Rt leg pain    Time 2   Period Weeks   Status On-going           PT Long Term Goals - 12/18/15 0946      PT LONG TERM GOAL #1   Title Pt  overal Rt sided pain to be no greater than a 4/10 to allow pt to be able to walk for up to 45 minutes at a time for community activity    Time 4   Period Weeks   Status On-going     PT LONG TERM GOAL #2   Title Pt to be able to complete housework without increased Rt shoulder pain    Time 8   Status On-going     PT LONG TERM GOAL #3   Title Pt to have started working again    Time 4   Period Weeks   Status On-going               Plan - 12/23/15 0944    Clinical Impression Statement focused on improving LE functional strength and hip mobility.  Pt required cues to complete lunges and squats in correct form.  Improved AROM of bilateral shoulders in seated position.  Pt with difficulty straightening knee completely with hamstring stretch due to discomfort.  Pt completeld session wtih reports of decreased pain.   Rehab Potential Good   PT Frequency 2x / week   PT Duration 4 weeks   PT Treatment/Interventions ADLs/Self Care Home Management;Functional mobility training;Therapeutic activities;Therapeutic exercise;Patient/family education   PT Next Visit Plan Progress functional strength.  Add tband exercises next session.[name]  Re-evaluate in 2 more sessions.    PT Home Exercise Plan wand exercises; knee to chest supine       Patient will benefit from skilled therapeutic intervention in order to improve the following deficits and impairments:  Decreased activity tolerance, Difficulty walking, Decreased range of motion, Impaired perceived functional ability, Pain  Visit Diagnosis: Chronic right shoulder pain  Pain in right leg  Difficulty in walking, not elsewhere classified     Problem List Patient Active Problem List   Diagnosis Date Noted  . Chest pain 09/26/2013  . HTN (hypertension) 09/22/2013  . Hypothyroidism   . Sleep apnea   . Anxiety   . NSTEMI (non-ST elevated myocardial infarction) ([place]) 09/20/2013  . HLD (hyperlipidemia) 01/17/2008  . Essential hypertension  01/17/2008  . CORONARY ARTERY DISEASE 01/17/2008    Teena Irani, PTA/CLT [phone]  12/23/2015, 9:51 AM  [place],  [place], [address] Phone: [phone]   Fax:  [phone]  Name: [name] [name] MRN: [id number] Date of Birth: Mar 19, 1945

## 2015-12-25 ENCOUNTER — Ambulatory Visit (HOSPITAL_COMMUNITY): Payer: Commercial Managed Care - HMO | Admitting: Physical Therapy

## 2015-12-25 DIAGNOSIS — R262 Difficulty in walking, not elsewhere classified: Secondary | ICD-10-CM | POA: Diagnosis not present

## 2015-12-25 DIAGNOSIS — M25511 Pain in right shoulder: Secondary | ICD-10-CM | POA: Diagnosis not present

## 2015-12-25 DIAGNOSIS — M79604 Pain in right leg: Secondary | ICD-10-CM

## 2015-12-25 DIAGNOSIS — G8929 Other chronic pain: Secondary | ICD-10-CM | POA: Diagnosis not present

## 2015-12-25 NOTE — Therapy (Signed)
[place], [place], [address] Phone: [phone]   Fax:  [phone]  Physical Therapy Treatment  Patient Details  Name: [name] [name] MRN: [id number] Date of Birth: September 20, 1945 Referring Provider: Rosita Fire  Encounter Date: 12/25/2015      PT End of Session - 12/25/15 0945    Visit Number 7   Number of Visits 8   Date for PT Re-Evaluation 01/01/16   Authorization Type [employer] medicare    Authorization - Visit Number 7   Authorization - Number of Visits 8   PT Start Time 0904   PT Stop Time 0943   PT Time Calculation (min) 39 min   Activity Tolerance Patient tolerated treatment well   Behavior During Therapy [facility] for tasks assessed/performed      Past Medical History:  Diagnosis Date  . Anxiety   . Heart attack   . History of echocardiogram    a. 2D ECHO: 09/21/2013; EF 55-60%; mild concentric hypertrophy, G1DD, mild LA dilation, PA pressure 37. Small RV pericardial effusion  . Hypertension   . Hypothyroidism   . NSTEMI (non-ST elevated myocardial infarction) ([place])    a.  Related to occlusion of the distal Cx. The mid to distal obtuse marginal #1 also contains a significant stenosis that is best treated with medical therapy. Patent RCA/LAD with tortuosity. LM patent. EF 55%.   . Sleep apnea   . Thyroid disease     Past Surgical History:  Procedure Laterality Date  . ABDOMINAL HYSTERECTOMY    . CARDIAC CATHETERIZATION  ~ 2000  . CORONARY ANGIOPLASTY  09/20/2013  . LEFT HEART CATHETERIZATION WITH CORONARY ANGIOGRAM N/A 09/20/2013   Procedure: LEFT HEART CATHETERIZATION WITH CORONARY ANGIOGRAM;  Surgeon: Sinclair Grooms, MD;  Location: [facility] CATH LAB;  Service: Cardiovascular;  Laterality: N/A;  . LEFT HEART CATHETERIZATION WITH CORONARY ANGIOGRAM N/A 09/27/2013   Procedure: LEFT HEART CATHETERIZATION WITH CORONARY ANGIOGRAM;  Surgeon: Blane Ohara, MD;  Location: [facility] CATH LAB;  Service:  Cardiovascular;  Laterality: N/A;  . THYROIDECTOMY  10/08/2011   Procedure: THYROIDECTOMY;  Surgeon: [name], MD;  Location: [facility] ORS;  Service: General;  Laterality: N/A;  Total  . VAGINAL HYSTERECTOMY  1993    There were no vitals filed for this visit.      Subjective Assessment - 12/25/15 0906    Subjective patient arrives stating that she is doing well today, she confirms that her shoulder is better but her back is still bothering her. Sometimes she can turn  or move around and she'll get cramps. Her balance has been ok, no falls or close calls. She rates her pain in her back as being 7/10, she states that exercises have helped the back.    How long can you sit comfortably? four to five minutes (Pt is sitting during interview for over fifteen minutes.)   How long can you stand comfortably? able to stands bent over.    How long can you walk comfortably? She is able to walk but she has to sit down.  Pt was unable to give therapist a time that she can walk for before she sits down.    Patient Stated Goals decrease pain, get back to walking and riding her bike.    Currently in Pain? Yes   Pain Score 7    Pain Location Back   Pain Orientation Mid;Lower   Pain Descriptors / Indicators Discomfort;Jabbing   Pain Type Chronic pain  Pain Radiating Towards occasional numbness in toes R    Pain Onset More than a month ago   Pain Frequency Constant   Aggravating Factors  being up for a long time, some exercises    Pain Relieving Factors medicine    Effect of Pain on Daily Activities cannot work                          [facility] Adult PT Treatment/Exercise - 12/25/15 0001      Lumbar Exercises: Stretches   Single Knee to Chest Stretch 5 reps;10 seconds   Double Knee to Chest Stretch 5 reps;10 seconds   Lower Trunk Rotation 5 reps;10 seconds   Pelvic Tilt Limitations 12-6 x10   Piriformis Stretch 2 reps;30 seconds   Piriformis Stretch Limitations supine      Lumbar  Exercises: Seated   LAQ on Chair Limitations seated marches on air pad 1x10 with core set      Lumbar Exercises: Supine   Clam 15 reps   Clam Limitations red TB    Bent Knee Raise 10 reps   Bent Knee Raise Limitations 2 second holds    Other Supine Lumbar Exercises dead bugs 1x10                PT Education - 12/25/15 0945    Education provided Yes   Education Details we are unable to remove her PMH from her record but she can always black this out with a sharpie before giving it to insurance    Person(s) Educated Patient   Methods Explanation   Comprehension Verbalized understanding          PT Short Term Goals - 12/18/15 0946      PT SHORT TERM GOAL #1   Title Pt Rt shoulder flexion to be to 150 degrees to allow pt to reach into upper cabinets with ease.    Time 2   Period Weeks   Status Achieved     PT SHORT TERM GOAL #2   Title Pt Rt body pain to be no greater than a 6/10 to allow pt to sleep at least four hours a night.    Time 2   Period Weeks   Status On-going     PT SHORT TERM GOAL #3   Title Pt to be able to sit in comfort for 30 minutes    Time 2   Period Weeks   Status On-going     PT SHORT TERM GOAL #4   Title Pt to be able to walk for 30 minutes without having increased Rt leg pain    Time 2   Period Weeks   Status On-going           PT Long Term Goals - 12/18/15 0946      PT LONG TERM GOAL #1   Title Pt overal Rt sided pain to be no greater than a 4/10 to allow pt to be able to walk for up to 45 minutes at a time for community activity    Time 4   Period Weeks   Status On-going     PT LONG TERM GOAL #2   Title Pt to be able to complete housework without increased Rt shoulder pain    Time 8   Status On-going     PT LONG TERM GOAL #3   Title Pt to have started working again    Time 4   Period Weeks  Status On-going               Plan - 12/25/15 0945    Clinical Impression Statement Focused primarily on lumbar  symptoms today with lumbar ROM and proximal functional strengthening today; patient continues to experience low back pain however does require cues for form throughout session for proper duration and technique/benefit of exercises. Difficulty noted with core coordination today as noted by difficulty with pelvic tilt control today as well as fatigue of core musculature with exercise. Noted quite a bit of core weakness in general today, and opted to focus on this rather than CKC exercise today however recommend that future sessions include work on both CKC activity and specific core strengthening techniques.    Rehab Potential Good   PT Frequency 2x / week   PT Duration 4 weeks   PT Treatment/Interventions ADLs/Self Care Home Management;Functional mobility training;Therapeutic activities;Therapeutic exercise;Patient/family education   PT Next Visit Plan Re-assessment. Increase focus on core strength. Progress functional strength.  Add tband exercises next session.   PT Home Exercise Plan wand exercises; knee to chest supine    Consulted and Agree with Plan of Care Patient      Patient will benefit from skilled therapeutic intervention in order to improve the following deficits and impairments:  Decreased activity tolerance, Difficulty walking, Decreased range of motion, Impaired perceived functional ability, Pain  Visit Diagnosis: Chronic right shoulder pain  Pain in right leg  Difficulty in walking, not elsewhere classified     Problem List Patient Active Problem List   Diagnosis Date Noted  . Chest pain 09/26/2013  . HTN (hypertension) 09/22/2013  . Hypothyroidism   . Sleep apnea   . Anxiety   . NSTEMI (non-ST elevated myocardial infarction) ([place]) 09/20/2013  . HLD (hyperlipidemia) 01/17/2008  . Essential hypertension 01/17/2008  . CORONARY ARTERY DISEASE 01/17/2008    Deniece Ree PT, DPT [phone]  [place], [place], [address] Phone: [phone]   Fax:  [phone]  Name: [name] [name] MRN: [id number] Date of Birth: Jan 30, 1946

## 2015-12-30 ENCOUNTER — Ambulatory Visit (HOSPITAL_COMMUNITY): Payer: Commercial Managed Care - HMO | Admitting: Physical Therapy

## 2015-12-30 ENCOUNTER — Ambulatory Visit (HOSPITAL_COMMUNITY)
Admission: RE | Admit: 2015-12-30 | Discharge: 2015-12-30 | Disposition: A | Discharge status: Home / Self Care | Payer: Commercial Managed Care - HMO | Source: Ambulatory Visit | Attending: Otolaryngology | Admitting: Otolaryngology

## 2015-12-30 DIAGNOSIS — G8929 Other chronic pain: Secondary | ICD-10-CM

## 2015-12-30 DIAGNOSIS — R262 Difficulty in walking, not elsewhere classified: Secondary | ICD-10-CM | POA: Diagnosis not present

## 2015-12-30 DIAGNOSIS — M25511 Pain in right shoulder: Principal | ICD-10-CM

## 2015-12-30 DIAGNOSIS — J329 Chronic sinusitis, unspecified: Secondary | ICD-10-CM | POA: Diagnosis not present

## 2015-12-30 DIAGNOSIS — M79604 Pain in right leg: Secondary | ICD-10-CM | POA: Diagnosis not present

## 2015-12-30 DIAGNOSIS — J3489 Other specified disorders of nose and nasal sinuses: Secondary | ICD-10-CM | POA: Insufficient documentation

## 2015-12-30 NOTE — Therapy (Signed)
[place], [place], [address] Phone: [phone]   Fax:  [phone]  Physical Therapy Treatment  Patient Details  Name: [name] [name] MRN: [id number] Date of Birth: 1945/08/28 Referring Provider: Brandon Melnick fanta   Encounter Date: 12/30/2015      PT End of Session - 12/30/15 0914    Visit Number 8   Number of Visits 8   Date for PT Re-Evaluation 01/01/16   Authorization Type [employer] medicare    Authorization - Visit Number 8   Authorization - Number of Visits 8   PT Start Time 0900   PT Stop Time 0945   PT Time Calculation (min) 45 min   Activity Tolerance Patient tolerated treatment well   Behavior During Therapy [facility] for tasks assessed/performed      Past Medical History:  Diagnosis Date  . Anxiety   . Heart attack   . History of echocardiogram    a. 2D ECHO: 09/21/2013; EF 55-60%; mild concentric hypertrophy, G1DD, mild LA dilation, PA pressure 37. Small RV pericardial effusion  . Hypertension   . Hypothyroidism   . NSTEMI (non-ST elevated myocardial infarction) ([place])    a.  Related to occlusion of the distal Cx. The mid to distal obtuse marginal #1 also contains a significant stenosis that is best treated with medical therapy. Patent RCA/LAD with tortuosity. LM patent. EF 55%.   . Sleep apnea   . Thyroid disease     Past Surgical History:  Procedure Laterality Date  . ABDOMINAL HYSTERECTOMY    . CARDIAC CATHETERIZATION  ~ 2000  . CORONARY ANGIOPLASTY  09/20/2013  . LEFT HEART CATHETERIZATION WITH CORONARY ANGIOGRAM N/A 09/20/2013   Procedure: LEFT HEART CATHETERIZATION WITH CORONARY ANGIOGRAM;  Surgeon: Sinclair Grooms, MD;  Location: [facility] CATH LAB;  Service: Cardiovascular;  Laterality: N/A;  . LEFT HEART CATHETERIZATION WITH CORONARY ANGIOGRAM N/A 09/27/2013   Procedure: LEFT HEART CATHETERIZATION WITH CORONARY ANGIOGRAM;  Surgeon: Blane Ohara, MD;  Location: [facility] CATH LAB;  Service:  Cardiovascular;  Laterality: N/A;  . THYROIDECTOMY  10/08/2011   Procedure: THYROIDECTOMY;  Surgeon: [name], MD;  Location: [facility] ORS;  Service: General;  Laterality: N/A;  Total  . VAGINAL HYSTERECTOMY  1993    There were no vitals filed for this visit.      Subjective Assessment - 12/30/15 0942    Subjective Pt arrives stating that    How long can you sit comfortably? Pt sat in waiting room for 30 minutes without distress    How long can you stand comfortably? not sure   How long can you walk comfortably? 30 minutes   Patient Stated Goals decrease pain, get back to walking and riding her bike.    Pain Score 4    Pain Location Shoulder   Pain Orientation Right   Pain Descriptors / Indicators Aching   Pain Type Chronic pain   Pain Onset More than a month ago   Pain Frequency Intermittent            [facility] PT Assessment - 12/30/15 0001      Assessment   Medical Diagnosis Rt shoulder, rib, leg and foot pain    Referring Provider Tesfaye fanta    Onset Date/Surgical Date 10/30/15   Next MD Visit 12/30/2015   Prior Therapy none      Precautions   Precautions None     Restrictions   Weight Bearing Restrictions No  Balance Screen   Has the patient fallen in the past 6 months Yes   How many times? 1  In store;    Has the patient had a decrease in activity level because of a fear of falling?  Yes   Is the patient reluctant to leave their home because of a fear of falling?  No     Home Environment   Living Environment Private residence   Type of [place] Access Level entry     Prior Function   Level of Independence Independent   Vocation Part time employment   Vocation Requirements standing: works at [employer], biking      Cognition   Overall Cognitive Status Within Functional Limits for tasks assessed     Observation/Other Assessments-Edema    Edema --  sit to supine      Functional Tests   Functional tests Sit to Stand      Sit to Stand   Comments 5x in 28.36 was 40.1      ROM / Strength   AROM / PROM / Strength PROM;Strength     AROM   Right Shoulder Flexion 140 Degrees  140; FROM when exercising    Right Shoulder ABduction 120 Degrees  able to demonstrate full ROM while standing during exercise   Right Shoulder Internal Rotation 70 Degrees  able to demonstrate full ROM during exercise    Right Shoulder External Rotation 85 Degrees  was 70     PROM   PROM Assessment Site Shoulder   Right/Left Shoulder Right   Right Shoulder Flexion 170 Degrees   Right Shoulder ABduction 160 Degrees  Pt resists motion      Strength   Strength Assessment Site Shoulder   Right/Left Shoulder Right     Transfers   Comments sit to supine pt completes reverse sit up motion  supine to sit pt completes a sit up motion                      [facility] Adult PT Treatment/Exercise - 12/30/15 0911      Lumbar Exercises: Supine   Bridge 10 reps   Straight Leg Raise 10 reps     Lumbar Exercises: Quadruped   [place]/Old Horse 10 reps   Opposite Arm/Leg Raise Right arm/Left leg;Left arm/Right leg;10 reps   Plank child pose x 3      Shoulder Exercises: Supine   External Rotation Right;10 reps;Weights   External Rotation Weight (lbs) 2   Internal Rotation Right;10 reps;Weights   Internal Rotation Weight (lbs) 2   Flexion 5 reps   ABduction 5 reps     Shoulder Exercises: Seated   Flexion Both;10 reps   Abduction 10 reps   Other Seated Exercises IR/ER 10x each     Shoulder Exercises: Standing   External Rotation Strengthening;Right;10 reps;Theraband   Theraband Level (Shoulder External Rotation) Level 2 (Red)   Internal Rotation Strengthening;10 reps   Theraband Level (Shoulder Internal Rotation) Level 2 (Red)   Flexion Strengthening;Right;10 reps   Theraband Level (Shoulder Flexion) Level 2 (Red)   ABduction Strengthening;Right;10 reps   Extension Strengthening;Right;10 reps;Theraband                   PT Short Term Goals - 12/30/15 0916      PT SHORT TERM GOAL #1   Title Pt Rt shoulder flexion to be to 150 degrees to allow pt to reach into upper  cabinets with ease.    Time 2   Period Weeks   Status Achieved     PT SHORT TERM GOAL #2   Title Pt Rt body pain to be no greater than a 6/10 to allow pt to sleep at least four hours a night. ( waking due to going to the restroom).    Time 2   Period Weeks   Status Achieved     PT SHORT TERM GOAL #3   Title Pt to be able to sit in comfort for 30 minutes    Baseline sat in waiting area for over 30 minutes without distress.    Time 2   Period Weeks   Status Achieved     PT SHORT TERM GOAL #4   Title Pt to be able to walk for 30 minutes without having increased Rt leg pain    Time 2   Period Weeks   Status Achieved           PT Long Term Goals - 12-31-15 0920      PT LONG TERM GOAL #1   Title Pt overal Rt sided pain to be no greater than a 4/10 to allow pt to be able to walk for up to 45 minutes at a time for community activity    Time 4   Period Weeks   Status On-going     PT LONG TERM GOAL #2   Title Pt to be able to complete housework without increased Rt shoulder pain    Time 8   Status On-going     PT LONG TERM GOAL #3   Title Pt to have started working again    Time 4   Period Weeks   Status Not Met               Plan - 12-31-2015 0944    Clinical Impression Statement PT standing ROM in her RT shoulder is greater standing than supine.  PT voices low back pain.  Pt transitions from sitting to supine using reverse sit up; and supine to sit using sit up technique.  Pt demonstrates improvement in all aspects.  Therapist gave pt a HEP to continue and is confident that if she continues her exercises and walking that she will return to prior functional level.     Rehab Potential Good   PT Frequency 2x / week   PT Duration 4 weeks   PT Treatment/Interventions ADLs/Self Care Home  Management;Functional mobility training;Therapeutic activities;Therapeutic exercise;Patient/family education   PT Next Visit Plan Discharge to HEP per pt wishes    PT Home Exercise Plan wand exercises; knee to chest supine    Consulted and Agree with Plan of Care Patient      Patient will benefit from skilled therapeutic intervention in order to improve the following deficits and impairments:  Decreased activity tolerance, Difficulty walking, Decreased range of motion, Impaired perceived functional ability, Pain  Visit Diagnosis: Chronic right shoulder pain       G-Codes - 2015/12/31 0947    Functional Assessment Tool Used ROM and pain level    Functional Limitation Carrying, moving and handling objects   Carrying, Moving and Handling Objects Goal Status ([id number]) At least 20 percent but less than 40 percent impaired, limited or restricted   Carrying, Moving and Handling Objects Discharge Status [phone]) At least 20 percent but less than 40 percent impaired, limited or restricted      Problem List Patient Active Problem List   Diagnosis Date  Noted  . Chest pain 09/26/2013  . HTN (hypertension) 09/22/2013  . Hypothyroidism   . Sleep apnea   . Anxiety   . NSTEMI (non-ST elevated myocardial infarction) ([place]) 09/20/2013  . HLD (hyperlipidemia) 01/17/2008  . Essential hypertension 01/17/2008  . CORONARY ARTERY DISEASE 01/17/2008    Rayetta Humphrey, PT CLT [phone] 12/30/2015, 9:48 AM  [place], [place], [address] Phone: [phone]   Fax:  [phone]  Name: [name] [name] MRN: [id number] Date of Birth: 1945-12-26  PHYSICAL THERAPY DISCHARGE SUMMARY  Visits from Start of Care: 8  Current functional level related to goals / functional outcomes: See above   Remaining deficits: See above   Education / Equipment: HEP Plan: Patient agrees to discharge.  Patient goals were partially met. Patient is  being discharged due to the patient's request.  ?????        Rayetta Humphrey, [place] CLT [phone]

## 2015-12-30 NOTE — Patient Instructions (Addendum)
Bridging    Slowly raise buttocks from floor, keeping stomach tight. Repeat _10___ times per set. Do _1___ sets per session. Do _2___ sessions per day. 1 http://orth.exer.us/1096   Copyright  VHI. All rights reserved.  Straight Leg Raise    Tighten stomach and slowly raise locked right leg __18__ inches from floor. Repeat _10___ times per set. Do __1__ sets per session. Do __2__ sessions per day.  http://orth.exer.us/1102   Copyright  VHI. All rights reserved.  Angry Cat Stretch    Tuck chin and tighten stomach, arching back. Repeat ___5_ times per set. Do _1___ sets per session. Do __2__ sessions per day.  http://orth.exer.us/118   Copyright  VHI. All rights reserved.  Arm / Leg Extension: Alternate (All-Fours)    Raise right arm and opposite leg. Do not arch neck. Repeat __10__ times per set. Do ___1_ sets per session. Do __2__ sessions per day.  http://orth.exer.us/110   Copyright  VHI. All rights reserved.  Strengthening: Resisted Abduction    Hold tubing with right arm across body. Pull up and away from side. Move through pain-free range of motion. Repeat _10___ times per set. Do _1___ sets per session. Do __2__ sessions per day.  http://orth.exer.us/826   Copyright  VHI. All rights reserved.  Strengthening: Resisted Extension    Hold tubing in right hand, arm forward. Pull arm back, elbow straight. Repeat _10___ times per set. Do _1___ sets per session. Do __2__ sessions per day.  http://orth.exer.us/832   Copyright  VHI. All rights reserved.  Strengthening: Resisted External Rotation    Hold tubing in right hand, elbow at side and forearm across body. Rotate forearm out. Repeat __10__ times per set. Do _1___ sets per session. Do _2___ sessions per day.  http://orth.exer.us/828   Copyright  VHI. All rights reserved.  Strengthening: Resisted Flexion    Hold tubing with left arm at side. Pull forward and up. Move shoulder through  pain-free range of motion. Repeat 10__ times per set. Do __1__ sets per session. Do __2__ sessions per day.  http://orth.exer.us/824   Copyright  VHI. All rights reserved.

## 2016-01-06 ENCOUNTER — Ambulatory Visit (HOSPITAL_COMMUNITY): Payer: Commercial Managed Care - HMO | Admitting: Physical Therapy

## 2016-01-08 ENCOUNTER — Ambulatory Visit (INDEPENDENT_AMBULATORY_CARE_PROVIDER_SITE_OTHER): Payer: Commercial Managed Care - HMO | Admitting: Otolaryngology

## 2016-01-08 ENCOUNTER — Ambulatory Visit (HOSPITAL_COMMUNITY): Payer: Commercial Managed Care - HMO | Admitting: Physical Therapy

## 2016-01-08 DIAGNOSIS — J320 Chronic maxillary sinusitis: Secondary | ICD-10-CM | POA: Diagnosis not present

## 2016-01-08 DIAGNOSIS — H35033 Hypertensive retinopathy, bilateral: Secondary | ICD-10-CM | POA: Diagnosis not present

## 2016-01-08 DIAGNOSIS — H35443 Age-related reticular degeneration of retina, bilateral: Secondary | ICD-10-CM | POA: Diagnosis not present

## 2016-01-08 DIAGNOSIS — J310 Chronic rhinitis: Secondary | ICD-10-CM | POA: Diagnosis not present

## 2016-01-08 DIAGNOSIS — E039 Hypothyroidism, unspecified: Secondary | ICD-10-CM | POA: Diagnosis not present

## 2016-01-08 DIAGNOSIS — I10 Essential (primary) hypertension: Secondary | ICD-10-CM | POA: Diagnosis not present

## 2016-01-08 DIAGNOSIS — J330 Polyp of nasal cavity: Secondary | ICD-10-CM | POA: Diagnosis not present

## 2016-01-08 DIAGNOSIS — H35032 Hypertensive retinopathy, left eye: Secondary | ICD-10-CM | POA: Diagnosis not present

## 2016-01-08 DIAGNOSIS — H35031 Hypertensive retinopathy, right eye: Secondary | ICD-10-CM | POA: Diagnosis not present

## 2016-01-08 DIAGNOSIS — H524 Presbyopia: Secondary | ICD-10-CM | POA: Diagnosis not present

## 2016-01-08 DIAGNOSIS — I2510 Atherosclerotic heart disease of native coronary artery without angina pectoris: Secondary | ICD-10-CM | POA: Diagnosis not present

## 2016-01-08 DIAGNOSIS — H2513 Age-related nuclear cataract, bilateral: Secondary | ICD-10-CM | POA: Diagnosis not present

## 2016-01-08 DIAGNOSIS — H18413 Arcus senilis, bilateral: Secondary | ICD-10-CM | POA: Diagnosis not present

## 2016-01-13 ENCOUNTER — Ambulatory Visit (INDEPENDENT_AMBULATORY_CARE_PROVIDER_SITE_OTHER): Payer: Commercial Managed Care - HMO | Admitting: Cardiovascular Disease

## 2016-01-13 ENCOUNTER — Encounter: Payer: Self-pay | Admitting: Cardiovascular Disease

## 2016-01-13 VITALS — BP 150/90 | HR 83 | Ht 63.0 in | Wt 180.0 lb

## 2016-01-13 DIAGNOSIS — Z9114 Patient's other noncompliance with medication regimen: Secondary | ICD-10-CM | POA: Diagnosis not present

## 2016-01-13 DIAGNOSIS — I2510 Atherosclerotic heart disease of native coronary artery without angina pectoris: Secondary | ICD-10-CM | POA: Diagnosis not present

## 2016-01-13 DIAGNOSIS — E7800 Pure hypercholesterolemia, unspecified: Secondary | ICD-10-CM | POA: Diagnosis not present

## 2016-01-13 DIAGNOSIS — I10 Essential (primary) hypertension: Secondary | ICD-10-CM | POA: Diagnosis not present

## 2016-01-13 MED ORDER — CHLORTHALIDONE 25 MG PO TABS: 25.0000 mg | ORAL_TABLET | Freq: Every day | ORAL | 6 refills | Status: DC

## 2016-01-13 MED ORDER — ROSUVASTATIN CALCIUM 5 MG PO TABS: 5.0000 mg | ORAL_TABLET | Freq: Every day | ORAL | 6 refills | Status: DC

## 2016-01-13 NOTE — Progress Notes (Signed)
SUBJECTIVE: The patient presents for routine follow up. She has a history of CAD. She was admitted 09/20/13 to 09/22/13 with chest pain and sustained a NSTEMI. Cath 09/20/13 showed distally occluded OM1 60-80%. It was treated medically at that time and she was discharged home. She was readmitted with chest pain a few days later, and repeat cath on 09/27/13 showed subtotal occlusion of a medium caliber highly tortuous OM worse from prior study, described as unfavorable for PCI, though if refractory angina PCI could be considered per notes.  09/21/13 Echo LVEF 55-60%.  PMH also includes HTN, hyperlipidemia, pre-diabetes mellitus, and OSA .  She has previously been unable to tolerate Ranexa, Imdur, amlodipine, and lisinopril.  She denies chest pain and shortness of breath. She has been struggling with sinus issues.  Low risk nuclear stress test (09/24/15).  Soc: She previously told me about her daughter who is studying sociology and business in [place] for 6 months. 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      Review of Systems: As per "subjective", otherwise negative.  Allergies  Allergen Reactions  . Amlodipine     Lip swelling per patient.   . Lisinopril     Dr. Legrand Rams stopped due to side effects per patient.  Could remember what side effects were.    [name] [Ranolazine]     Chest pain    Current Outpatient Prescriptions  Medication Sig Dispense Refill  . ALPRAZolam (XANAX) 0.5 MG tablet Take 0.5 mg by mouth as needed for anxiety.     . Ascorbic Acid (VITAMIN C) 1000 MG tablet Take 1,000 mg by mouth daily.    [name] aspirin 81 MG tablet Take 81 mg by mouth daily as needed (heart).     [name] levothyroxine (SYNTHROID, LEVOTHROID) 100 MCG tablet Take 100 mcg by mouth daily.    . metoprolol succinate (TOPROL-XL) 50 MG 24 hr tablet Take 1 tablet (50 mg total) by mouth daily. Take with or immediately following a meal.    . Multiple Vitamins-Minerals (MULTIVITAMINS THER.  W/MINERALS) TABS tablet Take 1 tablet by mouth daily.    . nitroGLYCERIN (NITROSTAT) 0.4 MG SL tablet Place 1 tablet (0.4 mg total) under the tongue every 5 (five) minutes x 3 doses as needed for chest pain. 25 tablet 3  . chlorthalidone (HYGROTON) 25 MG tablet Take 1 tablet (25 mg total) by mouth daily. 30 tablet 6  . rosuvastatin (CRESTOR) 5 MG tablet Take 1 tablet (5 mg total) by mouth daily. 30 tablet 6   No current facility-administered medications for this visit.     Past Medical History:  Diagnosis Date  . Anxiety   . Heart attack   . History of echocardiogram    a. 2D ECHO: 09/21/2013; EF 55-60%; mild concentric hypertrophy, G1DD, mild LA dilation, PA pressure 37. Small RV pericardial effusion  . Hypertension   . Hypothyroidism   . NSTEMI (non-ST elevated myocardial infarction) ([facility])    a.  Related to occlusion of the distal Cx. The mid to distal obtuse marginal #1 also contains a significant stenosis that is best treated with medical therapy. Patent RCA/LAD with tortuosity. LM patent. EF 55%.   . Sleep apnea   . Thyroid disease     Past Surgical History:  Procedure Laterality Date  . ABDOMINAL HYSTERECTOMY    . CARDIAC CATHETERIZATION  ~ 2000  . CORONARY ANGIOPLASTY  09/20/2013  . LEFT HEART CATHETERIZATION WITH CORONARY ANGIOGRAM N/A 09/20/2013  Procedure: LEFT HEART CATHETERIZATION WITH CORONARY ANGIOGRAM;  Surgeon: Sinclair Grooms, MD;  Location: [facility] CATH LAB;  Service: Cardiovascular;  Laterality: N/A;  . LEFT HEART CATHETERIZATION WITH CORONARY ANGIOGRAM N/A 09/27/2013   Procedure: LEFT HEART CATHETERIZATION WITH CORONARY ANGIOGRAM;  Surgeon: Blane Ohara, MD;  Location: [facility] CATH LAB;  Service: Cardiovascular;  Laterality: N/A;  . THYROIDECTOMY  10/08/2011   Procedure: THYROIDECTOMY;  Surgeon: [name], MD;  Location: [facility] ORS;  Service: General;  Laterality: N/A;  Total  . VAGINAL HYSTERECTOMY  1993    Social History   Social History  . Marital status:  Widowed    Spouse name: N/A  . Number of children: N/A  . Years of education: N/A   Occupational History  . Not on file.   Social History Main Topics  . Smoking status: Former Research scientist (life sciences)  . Smokeless tobacco: Never Used  . Alcohol use No  . Drug use: No  . Sexual activity: No   Other Topics Concern  . Not on file   Social History Narrative   ** Merged History Encounter **         Vitals:   01/13/16 0952  BP: (!) 150/90  Pulse: 83  SpO2: 97%  Weight: 180 lb (81.6 kg)  Height: 5\' 3"  (1.6 m)    PHYSICAL EXAM General: NAD HEENT: Normal. Neck: No JVD, no thyromegaly. Lungs: Clear to auscultation bilaterally with normal respiratory effort. CV: Nondisplaced PMI.  Regular rate and rhythm, normal S1/S2, no [id number], 1/6 systolic murmur over RUSB. No pretibial or periankle edema.  No carotid bruit.   Abdomen: Soft, nontender, no distention.  Neurologic: Alert and oriented.  Psych: Normal affect. Skin: Normal. Musculoskeletal: No gross deformities.    ECG: Most recent ECG reviewed.      ASSESSMENT AND PLAN: 1. CAD: Stable ischemic heart disease. Continue aspirin 81 mg. Previously stopped Lipitor 80 mg and Toprol-XL 50 mg. Ranexa caused her to have chest pain so she stopped taking it. Has been unable to tolerate Imdur and amlodipine as well. Stopped taking Plavix in the past. Will reinitiate Crestor 5 mg (she stopped this as well).  2. Essential HTN: Elevated. Both amlodipine and lisinopril caused lip swelling. She stopped chlorthalidone 25 mg daily. I emphasized the importance of blood pressure control as it is a significant risk factor for both stroke and heart attack. I will try chlorthalidone 25 mg again. I'll check a BMET next week.  3. Hyperlipidemia: Had been on high dose Lipitor but stopped it. LDL 98 on 09/20/2014, at which time I prescribed Zetia 10 mg.  Will reinitiate Crestor 5 mg (she stopped this as well).  Dispo: f/u 6 months  Kate Sable, M.D.,  F.A.C.C.

## 2016-01-13 NOTE — Patient Instructions (Signed)
Medication Instructions:   Begin Crestor 5mg  daily.  Begin Chlorthalidone 25mg  daily.  Continue all other medications.    Labwork:  BMET - order given today.  Due approximately 1 week.  Office will contact with results via phone or letter.    Testing/Procedures: none  Follow-Up: Your physician wants you to follow up in: 6 months.  You will receive a reminder letter in the mail one-two months in advance.  If you don't receive a letter, please call our office to schedule the follow up appointment   Any Other Special Instructions Will Be Listed Below (If Applicable).  If you need a refill on your cardiac medications before your next appointment, please call your pharmacy.

## 2016-01-17 DIAGNOSIS — R05 Cough: Secondary | ICD-10-CM | POA: Diagnosis not present

## 2016-01-17 DIAGNOSIS — Z79899 Other long term (current) drug therapy: Secondary | ICD-10-CM | POA: Diagnosis not present

## 2016-01-17 DIAGNOSIS — J069 Acute upper respiratory infection, unspecified: Secondary | ICD-10-CM | POA: Diagnosis not present

## 2016-01-17 DIAGNOSIS — E039 Hypothyroidism, unspecified: Secondary | ICD-10-CM | POA: Diagnosis not present

## 2016-02-28 ENCOUNTER — Encounter (HOSPITAL_COMMUNITY): Payer: Self-pay | Admitting: Emergency Medicine

## 2016-02-28 ENCOUNTER — Emergency Department (HOSPITAL_COMMUNITY)
Admission: EM | Admit: 2016-02-28 | Discharge: 2016-02-28 | Disposition: A | Discharge status: Home / Self Care | Payer: Medicare HMO | Attending: Emergency Medicine | Admitting: Emergency Medicine

## 2016-02-28 DIAGNOSIS — I2510 Atherosclerotic heart disease of native coronary artery without angina pectoris: Secondary | ICD-10-CM | POA: Insufficient documentation

## 2016-02-28 DIAGNOSIS — K439 Ventral hernia without obstruction or gangrene: Secondary | ICD-10-CM | POA: Diagnosis not present

## 2016-02-28 DIAGNOSIS — A09 Infectious gastroenteritis and colitis, unspecified: Secondary | ICD-10-CM | POA: Diagnosis not present

## 2016-02-28 DIAGNOSIS — J069 Acute upper respiratory infection, unspecified: Secondary | ICD-10-CM

## 2016-02-28 DIAGNOSIS — R1084 Generalized abdominal pain: Secondary | ICD-10-CM

## 2016-02-28 DIAGNOSIS — I10 Essential (primary) hypertension: Secondary | ICD-10-CM | POA: Diagnosis not present

## 2016-02-28 DIAGNOSIS — E039 Hypothyroidism, unspecified: Secondary | ICD-10-CM | POA: Insufficient documentation

## 2016-02-28 DIAGNOSIS — Z79899 Other long term (current) drug therapy: Secondary | ICD-10-CM | POA: Insufficient documentation

## 2016-02-28 DIAGNOSIS — Z87891 Personal history of nicotine dependence: Secondary | ICD-10-CM | POA: Diagnosis not present

## 2016-02-28 DIAGNOSIS — R197 Diarrhea, unspecified: Secondary | ICD-10-CM

## 2016-02-28 LAB — COMPREHENSIVE METABOLIC PANEL
ALK PHOS: 52 U/L (ref 38–126)
ALT: 21 U/L (ref 14–54)
AST: 26 U/L (ref 15–41)
Albumin: 3.5 g/dL (ref 3.5–5.0)
Anion gap: 7 (ref 5–15)
BILIRUBIN TOTAL: 0.5 mg/dL (ref 0.3–1.2)
BUN: 18 mg/dL (ref 6–20)
CALCIUM: 9 mg/dL (ref 8.9–10.3)
CO2: 27 mmol/L (ref 22–32)
CREATININE: 0.94 mg/dL (ref 0.44–1.00)
Chloride: 106 mmol/L (ref 101–111)
GFR calc non Af Amer: >60 mL/min (ref >60)
Glucose, Bld: 98 mg/dL (ref 65–99)
Potassium: 3.4 mmol/L — ABNORMAL LOW (ref 3.5–5.1)
SODIUM: 140 mmol/L (ref 135–145)
Total Protein: 6.6 g/dL (ref 6.5–8.1)

## 2016-02-28 LAB — CBC WITH DIFFERENTIAL/PLATELET
Basophils Absolute: 0 10*3/uL (ref 0.0–0.1)
Basophils Relative: 0 %
EOS ABS: 0.4 10*3/uL (ref 0.0–0.7)
Eosinophils Relative: 6 %
HEMATOCRIT: 42.7 % (ref 36.0–46.0)
HEMOGLOBIN: 14.4 g/dL (ref 12.0–15.0)
LYMPHS ABS: 1.9 10*3/uL (ref 0.7–4.0)
LYMPHS PCT: 32 %
MCH: 31.9 pg (ref 26.0–34.0)
MCHC: 33.7 g/dL (ref 30.0–36.0)
MCV: 94.5 fL (ref 78.0–100.0)
Monocytes Absolute: 0.4 10*3/uL (ref 0.1–1.0)
Monocytes Relative: 6 %
NEUTROS ABS: 3.5 10*3/uL (ref 1.7–7.7)
NEUTROS PCT: 56 %
Platelets: 497 10*3/uL — ABNORMAL HIGH (ref 150–400)
RBC: 4.52 MIL/uL (ref 3.87–5.11)
RDW: 16 % — ABNORMAL HIGH (ref 11.5–15.5)
WBC: 6.1 10*3/uL (ref 4.0–10.5)

## 2016-02-28 LAB — LIPASE, BLOOD: Lipase: 23 U/L (ref 11–51)

## 2016-02-28 MED ORDER — ONDANSETRON 4 MG PO TBDP
4.0000 mg | ORAL_TABLET | Freq: Once | ORAL | Status: AC
  Administered 2016-02-28: 4 mg via ORAL
  Filled 2016-02-28: qty 1

## 2016-02-28 MED ORDER — DICYCLOMINE HCL 10 MG PO CAPS
10.0000 mg | ORAL_CAPSULE | Freq: Once | ORAL | Status: AC
  Administered 2016-02-28: 10 mg via ORAL
  Filled 2016-02-28: qty 1

## 2016-02-28 MED ORDER — ACETAMINOPHEN 500 MG PO TABS
1000.0000 mg | ORAL_TABLET | Freq: Once | ORAL | Status: DC
  Filled 2016-02-28: qty 2

## 2016-02-28 NOTE — ED Provider Notes (Signed)
[facility] DEPT Provider Note   CSN: [id number] Arrival date & time: 02/28/16  1700     History   Chief Complaint Chief Complaint  Patient presents with  . Hernia    HPI [name] [name] is a 71 y.o. female.  The history is provided by the patient.  Diarrhea   This is a new problem. The current episode started 2 days ago. The problem occurs 2 to 4 times per day. Diarrhea characteristics: loose. There has been no fever. Associated symptoms include abdominal pain (2 days of cramping, intermittent pain. ), URI and cough (dry). Pertinent negatives include no vomiting, no chills and no myalgias. She has tried nothing for the symptoms.    Past Medical History:  Diagnosis Date  . Anxiety   . Heart attack   . History of echocardiogram    a. 2D ECHO: 09/21/2013; EF 55-60%; mild concentric hypertrophy, G1DD, mild LA dilation, PA pressure 37. Small RV pericardial effusion  . Hypertension   . Hypothyroidism   . NSTEMI (non-ST elevated myocardial infarction) ([place])    a.  Related to occlusion of the distal Cx. The mid to distal obtuse marginal #1 also contains a significant stenosis that is best treated with medical therapy. Patent RCA/LAD with tortuosity. LM patent. EF 55%.   . Sleep apnea   . Thyroid disease     Patient Active Problem List   Diagnosis Date Noted  . Chest pain 09/26/2013  . HTN (hypertension) 09/22/2013  . Hypothyroidism   . Sleep apnea   . Anxiety   . NSTEMI (non-ST elevated myocardial infarction) ([facility]) 09/20/2013  . HLD (hyperlipidemia) 01/17/2008  . Essential hypertension 01/17/2008  . CORONARY ARTERY DISEASE 01/17/2008    Past Surgical History:  Procedure Laterality Date  . ABDOMINAL HYSTERECTOMY    . CARDIAC CATHETERIZATION  ~ 2000  . CORONARY ANGIOPLASTY  09/20/2013  . LEFT HEART CATHETERIZATION WITH CORONARY ANGIOGRAM N/A 09/20/2013   Procedure: LEFT HEART CATHETERIZATION WITH CORONARY ANGIOGRAM;  Surgeon: Sinclair Grooms, MD;  Location: [facility] CATH  LAB;  Service: Cardiovascular;  Laterality: N/A;  . LEFT HEART CATHETERIZATION WITH CORONARY ANGIOGRAM N/A 09/27/2013   Procedure: LEFT HEART CATHETERIZATION WITH CORONARY ANGIOGRAM;  Surgeon: Blane Ohara, MD;  Location: [facility] CATH LAB;  Service: Cardiovascular;  Laterality: N/A;  . THYROIDECTOMY  10/08/2011   Procedure: THYROIDECTOMY;  Surgeon: [name], MD;  Location: [facility] ORS;  Service: General;  Laterality: N/A;  Total  . VAGINAL HYSTERECTOMY  1993    OB History    Gravida Para Term Preterm AB Living   2 2 2  0 0     SAB TAB Ectopic Multiple Live Births   0 0 0           Home Medications    Prior to Admission medications   Medication Sig Start Date End Date Taking? Authorizing Provider  ALPRAZolam Duanne Moron) 0.5 MG tablet Take 0.5 mg by mouth as needed for anxiety.    Yes Historical Provider, MD  Ascorbic Acid (VITAMIN C) 1000 MG tablet Take 1,000 mg by mouth daily.   Yes Historical Provider, MD  levothyroxine (SYNTHROID, LEVOTHROID) 125 MCG tablet Take 125 mcg by mouth daily. 02/25/16  Yes Historical Provider, MD  metoprolol succinate (TOPROL-XL) 100 MG 24 hr tablet Take 100 mg by mouth daily. 02/25/16  Yes Historical Provider, MD  Multiple Vitamins-Minerals (MULTIVITAMINS THER. W/MINERALS) TABS tablet Take 1 tablet by mouth daily.   Yes Historical Provider, MD  nitroGLYCERIN (NITROSTAT) 0.4 MG  SL tablet Place 1 tablet (0.4 mg total) under the tongue every 5 (five) minutes x 3 doses as needed for chest pain. 06/03/14  Yes Herminio Commons, MD  chlorthalidone (HYGROTON) 25 MG tablet Take 1 tablet (25 mg total) by mouth daily. Patient not taking: Reported on 02/28/2016 01/13/16   Herminio Commons, MD  rosuvastatin (CRESTOR) 5 MG tablet Take 1 tablet (5 mg total) by mouth daily. Patient not taking: Reported on 02/28/2016 01/13/16   Herminio Commons, MD    Family History No family history on file.  Social History Social History  Substance Use Topics  . Smoking status: Former  Research scientist (life sciences)  . Smokeless tobacco: Never Used  . Alcohol use No     Allergies   Amlodipine; Lisinopril; and Ranexa [ranolazine]   Review of Systems Review of Systems  Constitutional: Negative for chills.  Respiratory: Positive for cough (dry).   Gastrointestinal: Positive for abdominal pain (2 days of cramping, intermittent pain. ) and diarrhea. Negative for vomiting.  Musculoskeletal: Negative for myalgias.  Ten systems are reviewed and are negative for acute change except as noted in the HPI    Physical Exam Updated Vital Signs BP 181/92 (BP Location: Left Arm)   Pulse 64   Temp 98 F (36.7 C) (Oral)   Resp 18   Ht 5\' 3"  (1.6 m)   Wt 180 lb (81.6 kg)   SpO2 98%   BMI 31.89 kg/m   Physical Exam  Constitutional: She is oriented to person, place, and time. She appears well-developed and well-nourished. No distress.  HENT:  Head: Normocephalic and atraumatic.  Nose: Mucosal edema present.  Mouth/Throat: Oropharynx is clear and moist. No tonsillar exudate.  Eyes: Conjunctivae and EOM are normal. Pupils are equal, round, and reactive to light. Right eye exhibits no discharge. Left eye exhibits no discharge. No scleral icterus.  Neck: Normal range of motion. Neck supple.  Cardiovascular: Normal rate and regular rhythm.  Exam reveals no gallop and no friction rub.   No murmur heard. Pulmonary/Chest: Effort normal and breath sounds normal. No stridor. No respiratory distress. She has no rales.  Abdominal: Soft. She exhibits no distension. There is tenderness (diffuse discmofort). There is no rigidity, no rebound, no guarding, no CVA tenderness, no tenderness at McBurney's point and negative Murphy's sign. A hernia is present. Hernia confirmed positive in the ventral area. Inguinal: reducible.  Musculoskeletal: She exhibits no edema or tenderness.  Neurological: She is alert and oriented to person, place, and time.  Skin: Skin is warm and dry. No rash noted. She is not diaphoretic.  No erythema.  Psychiatric: She has a normal mood and affect.  Vitals reviewed.    ED Treatments / Results  Labs (all labs ordered are listed, but only abnormal results are displayed) Labs Reviewed  CBC WITH DIFFERENTIAL/PLATELET - Abnormal; Notable for the following:       Result Value   RDW 16.0 (*)    Platelets 497 (*)    All other components within normal limits  COMPREHENSIVE METABOLIC PANEL - Abnormal; Notable for the following:    Potassium 3.4 (*)    All other components within normal limits  LIPASE, BLOOD    EKG  EKG Interpretation None       Radiology No results found.  Procedures Procedures (including critical care time)  Medications Ordered in ED Medications  acetaminophen (TYLENOL) tablet 1,000 mg (1,000 mg Oral Refused 02/28/16 1852)  ondansetron (ZOFRAN-ODT) disintegrating tablet 4 mg (4 mg Oral Given  02/28/16 1852)  dicyclomine (BENTYL) capsule 10 mg (10 mg Oral Given 02/28/16 1852)     Initial Impression / Assessment and Plan / ED Course  I have reviewed the triage vital signs and the nursing notes.  Pertinent labs & imaging results that were available during my care of the patient were reviewed by me and considered in my medical decision making (see chart for details).     1. URI sx Patient's afebrile with stable vital signs. She is well-appearing and well-hydrated. No evidence of toxicity. Cough is dry and patient reports that all the mucus is from nasal congestion itself. Lungs clear to auscultation. Low suspicion for pneumonia. No indication for imaging at this time.  2. Nausea & Diarrhea & Abd cramping No recent antibiotic use. Diarrhea is nonbloody. Low suspicion for C. difficile. Likely viral process. Labs grossly reassuring without leukocytosis, his of pancreatitis or biliary obstruction. Exam did reveal ventral hernia which is easily reduced and nontender. No suspicion for small bowel obstruction, acute cholecystitis, appendicitis, colitis.  No indication for advanced imaging at this time. Patient was able to tolerate by mouth intake.   The patient is safe for discharge with strict return precautions.   Final Clinical Impressions(s) / ED Diagnoses   Final diagnoses:  Ventral hernia without obstruction or gangrene  Generalized abdominal pain  Diarrhea of presumed infectious origin  Upper respiratory tract infection, unspecified type   Disposition: Discharge  Condition: Good  I have discussed the results, Dx and Tx plan with the patient who expressed understanding and agree(s) with the plan. Discharge instructions discussed at great length. The patient was given strict return precautions who verbalized understanding of the instructions. No further questions at time of discharge.    Discharge Medication List as of 02/28/2016 10:03 PM      Follow Up: Rosita Fire, MD [place] [address] [phone]  Schedule an appointment as soon as possible for a visit  in 5-7 days, If symptoms do not improve or  worsen  Vickie Epley, MD [place] [address] [phone]  Schedule an appointment as soon as possible for a visit  As needed for evaluation of the ventral hernia      Fatima Blank, MD 02/28/16 2214

## 2016-02-28 NOTE — ED Triage Notes (Signed)
Pt noticed "knot" in abdomen that protrudes with certain movements. Pt reports nausea since yesterday, and diarrhea yesterday.

## 2016-04-08 DIAGNOSIS — E669 Obesity, unspecified: Secondary | ICD-10-CM | POA: Diagnosis not present

## 2016-04-08 DIAGNOSIS — I214 Non-ST elevation (NSTEMI) myocardial infarction: Secondary | ICD-10-CM | POA: Diagnosis not present

## 2016-04-08 DIAGNOSIS — G4700 Insomnia, unspecified: Secondary | ICD-10-CM | POA: Diagnosis not present

## 2016-04-08 DIAGNOSIS — I2510 Atherosclerotic heart disease of native coronary artery without angina pectoris: Secondary | ICD-10-CM | POA: Diagnosis not present

## 2016-04-08 DIAGNOSIS — E039 Hypothyroidism, unspecified: Secondary | ICD-10-CM | POA: Diagnosis not present

## 2016-04-08 DIAGNOSIS — Z6832 Body mass index (BMI) 32.0-32.9, adult: Secondary | ICD-10-CM | POA: Diagnosis not present

## 2016-04-08 DIAGNOSIS — I10 Essential (primary) hypertension: Secondary | ICD-10-CM | POA: Diagnosis not present

## 2016-04-08 DIAGNOSIS — J309 Allergic rhinitis, unspecified: Secondary | ICD-10-CM | POA: Diagnosis not present

## 2016-05-06 ENCOUNTER — Ambulatory Visit (INDEPENDENT_AMBULATORY_CARE_PROVIDER_SITE_OTHER): Payer: Medicare HMO | Admitting: Otolaryngology

## 2016-05-06 DIAGNOSIS — J343 Hypertrophy of nasal turbinates: Secondary | ICD-10-CM | POA: Diagnosis not present

## 2016-05-06 DIAGNOSIS — J310 Chronic rhinitis: Secondary | ICD-10-CM

## 2016-05-06 DIAGNOSIS — J330 Polyp of nasal cavity: Secondary | ICD-10-CM | POA: Diagnosis not present

## 2016-05-27 ENCOUNTER — Ambulatory Visit (INDEPENDENT_AMBULATORY_CARE_PROVIDER_SITE_OTHER): Payer: Self-pay | Admitting: Otolaryngology

## 2016-07-09 DIAGNOSIS — J019 Acute sinusitis, unspecified: Secondary | ICD-10-CM | POA: Diagnosis not present

## 2016-07-09 DIAGNOSIS — I10 Essential (primary) hypertension: Secondary | ICD-10-CM | POA: Diagnosis not present

## 2016-07-09 DIAGNOSIS — J309 Allergic rhinitis, unspecified: Secondary | ICD-10-CM | POA: Diagnosis not present

## 2016-07-11 ENCOUNTER — Other Ambulatory Visit: Payer: Self-pay | Admitting: Cardiovascular Disease

## 2016-07-14 ENCOUNTER — Ambulatory Visit (INDEPENDENT_AMBULATORY_CARE_PROVIDER_SITE_OTHER): Payer: Medicare HMO | Admitting: Cardiovascular Disease

## 2016-07-14 ENCOUNTER — Encounter: Payer: Self-pay | Admitting: Cardiovascular Disease

## 2016-07-14 VITALS — BP 154/92 | HR 69 | Ht 63.0 in | Wt 186.0 lb

## 2016-07-14 DIAGNOSIS — I10 Essential (primary) hypertension: Secondary | ICD-10-CM

## 2016-07-14 DIAGNOSIS — I2510 Atherosclerotic heart disease of native coronary artery without angina pectoris: Secondary | ICD-10-CM | POA: Diagnosis not present

## 2016-07-14 DIAGNOSIS — E782 Mixed hyperlipidemia: Secondary | ICD-10-CM

## 2016-07-14 NOTE — Patient Instructions (Signed)

## 2016-07-14 NOTE — Progress Notes (Signed)
SUBJECTIVE: The patient presents for routine follow up. She has a history of CAD. She was admitted 09/20/13 to 09/22/13 with chest pain and sustained a NSTEMI. Cath 09/20/13 showed distally occluded OM1 60-80%. It was treated medically at that time and she was discharged home. She was readmitted with chest pain a few days later, and repeat cath on 09/27/13 showed subtotal occlusion of a medium caliber highly tortuous OM worse from prior study, described as unfavorable for PCI, though if refractory angina PCI could be considered per notes.  09/21/13 Echo LVEF 55-60%.  PMH also includes HTN, hyperlipidemia, pre-diabetes mellitus, and OSA .  She has previously been unable to tolerate Ranexa, Imdur, amlodipine, and lisinopril.  Low risk nuclear stress test (09/24/15).  The patient denies any symptoms of chest pain, palpitations, shortness of breath, lightheadedness, dizziness, leg swelling, orthopnea, PND, and syncope.  Her primary complaint is sinus congestion, pressure, and headaches. She is currently on prednisone and azithromycin. Blood pressures at home range from 120-140/80s.   Soc: Her daughter is studying sociology and business at [employer] and is on a 4 year scholarship.   Review of Systems: As per "subjective", otherwise negative.  Allergies  Allergen Reactions  . Amlodipine     Lip swelling per patient.   . Lisinopril     Dr. Legrand Rams stopped due to side effects per patient.  Could remember what side effects were.    [name] [Ranolazine]     Chest pain    Current Outpatient Prescriptions  Medication Sig Dispense Refill  . ALPRAZolam (XANAX) 0.5 MG tablet Take 0.5 mg by mouth as needed for anxiety.     . Ascorbic Acid (VITAMIN C) 1000 MG tablet Take 1,000 mg by mouth daily.    [name] azithromycin (ZITHROMAX) 250 MG tablet TAKE 2 TABLETS BY MOUTH ON DAY 1, THEN TAKE 1 TABLET DAILY ON DAYS 2-5  0  . chlorthalidone (HYGROTON) 25 MG tablet TAKE 1 TABLET BY MOUTH DAILY. 30 tablet 1  .  levothyroxine (SYNTHROID, LEVOTHROID) 125 MCG tablet Take 125 mcg by mouth daily.    . metoprolol succinate (TOPROL-XL) 100 MG 24 hr tablet Take 100 mg by mouth daily.    . Multiple Vitamins-Minerals (MULTIVITAMINS THER. W/MINERALS) TABS tablet Take 1 tablet by mouth daily.    . nitroGLYCERIN (NITROSTAT) 0.4 MG SL tablet Place 1 tablet (0.4 mg total) under the tongue every 5 (five) minutes x 3 doses as needed for chest pain. 25 tablet 3  . predniSONE (DELTASONE) 20 MG tablet Take 20 mg by mouth daily.  0  . rosuvastatin (CRESTOR) 5 MG tablet TAKE 1 TABLET BY MOUTH DAILY. 30 tablet 1   No current facility-administered medications for this visit.     Past Medical History:  Diagnosis Date  . Anxiety   . Heart attack ([place])   . History of echocardiogram    a. 2D ECHO: 09/21/2013; EF 55-60%; mild concentric hypertrophy, G1DD, mild LA dilation, PA pressure 37. Small RV pericardial effusion  . Hypertension   . Hypothyroidism   . NSTEMI (non-ST elevated myocardial infarction) ([place])    a.  Related to occlusion of the distal Cx. The mid to distal obtuse marginal #1 also contains a significant stenosis that is best treated with medical therapy. Patent RCA/LAD with tortuosity. LM patent. EF 55%.   . Sleep apnea   . Thyroid disease     Past Surgical History:  Procedure Laterality Date  . ABDOMINAL HYSTERECTOMY    .  CARDIAC CATHETERIZATION  ~ 2000  . CORONARY ANGIOPLASTY  09/20/2013  . LEFT HEART CATHETERIZATION WITH CORONARY ANGIOGRAM N/A 09/20/2013   Procedure: LEFT HEART CATHETERIZATION WITH CORONARY ANGIOGRAM;  Surgeon: Sinclair Grooms, MD;  Location: [facility] CATH LAB;  Service: Cardiovascular;  Laterality: N/A;  . LEFT HEART CATHETERIZATION WITH CORONARY ANGIOGRAM N/A 09/27/2013   Procedure: LEFT HEART CATHETERIZATION WITH CORONARY ANGIOGRAM;  Surgeon: Blane Ohara, MD;  Location: [facility] CATH LAB;  Service: Cardiovascular;  Laterality: N/A;  . THYROIDECTOMY  10/08/2011   Procedure: THYROIDECTOMY;   Surgeon: [name], MD;  Location: [facility] ORS;  Service: General;  Laterality: N/A;  Total  . VAGINAL HYSTERECTOMY  1993    Social History   Social History  . Marital status: Widowed    Spouse name: N/A  . Number of children: N/A  . Years of education: N/A   Occupational History  . Not on file.   Social History Main Topics  . Smoking status: Former Research scientist (life sciences)  . Smokeless tobacco: Never Used  . Alcohol use No  . Drug use: No  . Sexual activity: No   Other Topics Concern  . Not on file   Social History Narrative   ** Merged History Encounter **         Vitals:   07/14/16 1026  BP: (!) 154/92  Pulse: 69  SpO2: 96%  Weight: 186 lb (84.4 kg)  Height: 5\' 3"  (1.6 m)    Wt Readings from Last 3 Encounters:  07/14/16 186 lb (84.4 kg)  02/28/16 180 lb (81.6 kg)  01/13/16 180 lb (81.6 kg)     PHYSICAL EXAM General: NAD HEENT: Normal. Neck: No JVD, no thyromegaly. Lungs: Clear to auscultation bilaterally with normal respiratory effort. CV: Nondisplaced PMI.  Regular rate and rhythm, normal S1/S2, no S3/S4, no murmur. No pretibial or periankle edema.  No carotid bruit.   Abdomen: Soft, nontender, no distention.  Neurologic: Alert and oriented.  Psych: Normal affect. Skin: Normal. Musculoskeletal: No gross deformities.    ECG: Most recent ECG reviewed.   Labs: Lab Results  Component Value Date/Time   K 3.4 (L) 02/28/2016 07:22 PM   BUN 18 02/28/2016 07:22 PM   CREATININE 0.94 02/28/2016 07:22 PM   ALT 21 02/28/2016 07:22 PM   HGB 14.4 02/28/2016 07:22 PM     Lipids: Lab Results  Component Value Date/Time   LDLCALC 57 09/27/2013 01:35 AM   CHOL 110 09/27/2013 01:35 AM   TRIG 54 09/27/2013 01:35 AM   HDL 42 09/27/2013 01:35 AM       ASSESSMENT AND PLAN: 1. CAD: Stable ischemic heart disease. Continue aspirin and Crestor. She was previously unable to tolerate Imdur and amlodipine. She had previously stopped taking metoprolol.  2. Essential HTN:  Elevated on chlorthalidone 25 mg daily. Both amlodipine and lisinopril caused lip swelling.  She is currently on prednisone and azithromycin. Blood pressures at home range from 120-140/80s. I have asked her to monitor at home and to inform me if they remain elevated.  3. Hyperlipidemia: Continue Crestor 5 mg.    Disposition: Follow up 1 yr  Kate Sable, M.D., F.A.C.C.

## 2016-08-18 DIAGNOSIS — I10 Essential (primary) hypertension: Secondary | ICD-10-CM | POA: Diagnosis not present

## 2016-08-18 DIAGNOSIS — R61 Generalized hyperhidrosis: Secondary | ICD-10-CM | POA: Diagnosis not present

## 2016-08-18 DIAGNOSIS — R0600 Dyspnea, unspecified: Secondary | ICD-10-CM | POA: Diagnosis not present

## 2016-08-18 DIAGNOSIS — I25110 Atherosclerotic heart disease of native coronary artery with unstable angina pectoris: Secondary | ICD-10-CM | POA: Diagnosis not present

## 2016-08-18 DIAGNOSIS — R0789 Other chest pain: Secondary | ICD-10-CM | POA: Diagnosis not present

## 2016-08-18 DIAGNOSIS — R079 Chest pain, unspecified: Secondary | ICD-10-CM | POA: Diagnosis not present

## 2016-08-18 DIAGNOSIS — E860 Dehydration: Secondary | ICD-10-CM | POA: Diagnosis not present

## 2016-08-18 DIAGNOSIS — Z79899 Other long term (current) drug therapy: Secondary | ICD-10-CM | POA: Diagnosis not present

## 2016-08-18 DIAGNOSIS — I252 Old myocardial infarction: Secondary | ICD-10-CM | POA: Diagnosis not present

## 2016-08-18 DIAGNOSIS — E039 Hypothyroidism, unspecified: Secondary | ICD-10-CM | POA: Diagnosis not present

## 2016-08-18 DIAGNOSIS — Z87891 Personal history of nicotine dependence: Secondary | ICD-10-CM | POA: Diagnosis not present

## 2016-08-19 DIAGNOSIS — I200 Unstable angina: Secondary | ICD-10-CM | POA: Diagnosis not present

## 2016-08-20 DIAGNOSIS — S6992XA Unspecified injury of left wrist, hand and finger(s), initial encounter: Secondary | ICD-10-CM | POA: Diagnosis not present

## 2016-08-20 DIAGNOSIS — M25532 Pain in left wrist: Secondary | ICD-10-CM | POA: Diagnosis not present

## 2016-08-20 DIAGNOSIS — R51 Headache: Secondary | ICD-10-CM | POA: Diagnosis not present

## 2016-08-20 DIAGNOSIS — R1900 Intra-abdominal and pelvic swelling, mass and lump, unspecified site: Secondary | ICD-10-CM | POA: Diagnosis not present

## 2016-08-20 DIAGNOSIS — S60212A Contusion of left wrist, initial encounter: Secondary | ICD-10-CM | POA: Diagnosis not present

## 2016-08-20 DIAGNOSIS — I10 Essential (primary) hypertension: Secondary | ICD-10-CM | POA: Diagnosis not present

## 2016-08-20 DIAGNOSIS — M79642 Pain in left hand: Secondary | ICD-10-CM | POA: Diagnosis not present

## 2016-08-20 DIAGNOSIS — S3992XA Unspecified injury of lower back, initial encounter: Secondary | ICD-10-CM | POA: Diagnosis not present

## 2016-08-20 DIAGNOSIS — I6782 Cerebral ischemia: Secondary | ICD-10-CM | POA: Diagnosis not present

## 2016-08-20 DIAGNOSIS — K439 Ventral hernia without obstruction or gangrene: Secondary | ICD-10-CM | POA: Diagnosis not present

## 2016-08-20 DIAGNOSIS — M542 Cervicalgia: Secondary | ICD-10-CM | POA: Diagnosis not present

## 2016-08-20 DIAGNOSIS — Z8249 Family history of ischemic heart disease and other diseases of the circulatory system: Secondary | ICD-10-CM | POA: Diagnosis not present

## 2016-08-20 DIAGNOSIS — S161XXA Strain of muscle, fascia and tendon at neck level, initial encounter: Secondary | ICD-10-CM | POA: Diagnosis not present

## 2016-08-20 DIAGNOSIS — Z79899 Other long term (current) drug therapy: Secondary | ICD-10-CM | POA: Diagnosis not present

## 2016-08-20 DIAGNOSIS — Z87891 Personal history of nicotine dependence: Secondary | ICD-10-CM | POA: Diagnosis not present

## 2016-08-20 DIAGNOSIS — Z951 Presence of aortocoronary bypass graft: Secondary | ICD-10-CM | POA: Diagnosis not present

## 2016-08-20 DIAGNOSIS — M5412 Radiculopathy, cervical region: Secondary | ICD-10-CM | POA: Diagnosis not present

## 2016-08-20 DIAGNOSIS — M62838 Other muscle spasm: Secondary | ICD-10-CM | POA: Diagnosis not present

## 2016-08-20 DIAGNOSIS — E039 Hypothyroidism, unspecified: Secondary | ICD-10-CM | POA: Diagnosis not present

## 2016-08-20 DIAGNOSIS — I252 Old myocardial infarction: Secondary | ICD-10-CM | POA: Diagnosis not present

## 2016-08-20 DIAGNOSIS — M545 Low back pain: Secondary | ICD-10-CM | POA: Diagnosis not present

## 2016-08-20 DIAGNOSIS — M546 Pain in thoracic spine: Secondary | ICD-10-CM | POA: Diagnosis not present

## 2016-08-20 DIAGNOSIS — W1839XA Other fall on same level, initial encounter: Secondary | ICD-10-CM | POA: Diagnosis not present

## 2016-08-21 DIAGNOSIS — R1900 Intra-abdominal and pelvic swelling, mass and lump, unspecified site: Secondary | ICD-10-CM | POA: Diagnosis not present

## 2016-08-21 DIAGNOSIS — M542 Cervicalgia: Secondary | ICD-10-CM | POA: Diagnosis not present

## 2016-08-21 DIAGNOSIS — M62838 Other muscle spasm: Secondary | ICD-10-CM | POA: Diagnosis not present

## 2016-08-21 DIAGNOSIS — Z87891 Personal history of nicotine dependence: Secondary | ICD-10-CM | POA: Diagnosis not present

## 2016-08-21 DIAGNOSIS — R51 Headache: Secondary | ICD-10-CM | POA: Diagnosis not present

## 2016-08-21 DIAGNOSIS — I252 Old myocardial infarction: Secondary | ICD-10-CM | POA: Diagnosis not present

## 2016-08-21 DIAGNOSIS — I6782 Cerebral ischemia: Secondary | ICD-10-CM | POA: Diagnosis not present

## 2016-08-21 DIAGNOSIS — M79642 Pain in left hand: Secondary | ICD-10-CM | POA: Diagnosis not present

## 2016-08-21 DIAGNOSIS — Z79899 Other long term (current) drug therapy: Secondary | ICD-10-CM | POA: Diagnosis not present

## 2016-08-21 DIAGNOSIS — I10 Essential (primary) hypertension: Secondary | ICD-10-CM | POA: Diagnosis not present

## 2016-08-21 DIAGNOSIS — Z951 Presence of aortocoronary bypass graft: Secondary | ICD-10-CM | POA: Diagnosis not present

## 2016-08-21 DIAGNOSIS — M5412 Radiculopathy, cervical region: Secondary | ICD-10-CM | POA: Diagnosis not present

## 2016-08-21 DIAGNOSIS — E039 Hypothyroidism, unspecified: Secondary | ICD-10-CM | POA: Diagnosis not present

## 2016-08-24 ENCOUNTER — Ambulatory Visit: Payer: Self-pay | Admitting: Cardiovascular Disease

## 2016-08-26 DIAGNOSIS — R0789 Other chest pain: Secondary | ICD-10-CM | POA: Diagnosis not present

## 2016-08-26 DIAGNOSIS — I2510 Atherosclerotic heart disease of native coronary artery without angina pectoris: Secondary | ICD-10-CM | POA: Diagnosis not present

## 2016-08-26 DIAGNOSIS — I10 Essential (primary) hypertension: Secondary | ICD-10-CM | POA: Diagnosis not present

## 2016-08-26 DIAGNOSIS — M542 Cervicalgia: Secondary | ICD-10-CM | POA: Diagnosis not present

## 2016-09-06 ENCOUNTER — Encounter: Payer: Self-pay | Admitting: *Deleted

## 2016-09-07 ENCOUNTER — Ambulatory Visit: Payer: Self-pay | Admitting: Cardiovascular Disease

## 2016-09-14 ENCOUNTER — Ambulatory Visit (INDEPENDENT_AMBULATORY_CARE_PROVIDER_SITE_OTHER): Payer: Medicare HMO | Admitting: Neurology

## 2016-09-14 ENCOUNTER — Encounter: Payer: Self-pay | Admitting: Neurology

## 2016-09-14 ENCOUNTER — Encounter (INDEPENDENT_AMBULATORY_CARE_PROVIDER_SITE_OTHER): Payer: Self-pay

## 2016-09-14 VITALS — BP 154/91 | HR 90 | Ht 63.0 in | Wt 187.4 lb

## 2016-09-14 DIAGNOSIS — M542 Cervicalgia: Secondary | ICD-10-CM

## 2016-09-14 DIAGNOSIS — R202 Paresthesia of skin: Secondary | ICD-10-CM | POA: Insufficient documentation

## 2016-09-14 MED ORDER — CLONAZEPAM 0.5 MG PO TABS: 0.5000 mg | ORAL_TABLET | Freq: Every evening | ORAL | 0 refills | Status: DC | PRN

## 2016-09-14 MED ORDER — DULOXETINE HCL 60 MG PO CPEP: 60.0000 mg | ORAL_CAPSULE | Freq: Every day | ORAL | 12 refills | Status: DC

## 2016-09-14 NOTE — Progress Notes (Signed)
PATIENT: [name] [name] DOB: 06-04-1945  Chief Complaint  Patient presents with  . NP  FANTA  . Cervical Radiculopathy    using cane. States whole body hurts.      HISTORICAL  [name] [name] is a 71 years old right-handed female, seen in refer by her primary care doctor Rosita Fire for evaluation of cervical radiculopathy, whole body achy pain, initial evaluation was September 14 2016.  Have reviewed and summarized referring note, she had a history of coronary artery disease, she is not taking aspirin regularly, she believes in natural supplement, hypertension (but only take half of the prescribed medications), hypothyroidism, chronic insomnia, hyperlipidemia,   She fell at the store on August 20 2016 1pm while returning her DVD, she tripped and fell on the floor, her knees landed first, she then rolled to her eft side, she denied loss of consciousness, she was able to drive back home without any difficulty  But upon arriving home, she noticed diffuse body achy pain, difficulty using her arm, moving her neck, radiating pain to her left shoulder,  she is urged by her family members to be evaluated, she presented to local emergency room  I reviewed emergency department evaluation from [place] [facility] on August 21 2016, she presented to the emergency room complains of constant dull left-sided neck pain, since she fell and hit her left side of her head the night before, she felt pins and needle into her left arm, this happened since the incident has been persistent, upon presentation blood pressure 137/104, heart rate of 87,   right wrist with chest x-ray was negative,  Laboratory evaluations normal CMP, creatinine of 0.74, most CBC hemoglobin of 15.6, she was given prescription of tramadol 10 tablet, prednisone 20 mg 3 tablets  CT of abdomen and pelvis with contrast showed super umbilical hernia containing assessment of transverse colon without evidence of obstruction all  active inflammation,  Cervical x-ray, and thoracic x-ray showed mild degenerative changes no evidence of acute fracture   I reviewed CT report on August 21 2016, CT head without contrast showed no acute abnormality, skull showed  no evidence of fracture or soft tissue swelling. Mild small vessel disease. CT of cervical spine showed normal alignment, mild to moderate multilevel degenerative disc disease, no acute abnormality  Since the event, she complains of achiness all over, difficult to perform all household chore, such as vacuuming,difficulty sleeping, weakness, knees give out underneath her, intermittent numbness of bilateral lower extremity, neck pain, going down both side of her shoulders,more to the left side, radiating numbness to her left hand, also complains of frequent bilateral frontal headaches,  Review of system: 14 system review was performed, pertinent are: Headache, dizziness, not enough sleep, joint pain, achy muscles, allergy, runny nose  ALLERGIES: Allergies  Allergen Reactions  . Amlodipine     Lip swelling per patient.   . Lisinopril     Dr. Legrand Rams stopped due to side effects per patient.  Could remember what side effects were.    [name] [Ranolazine]     Chest pain    HOME MEDICATIONS: Current Outpatient Prescriptions  Medication Sig Dispense Refill  . Ascorbic Acid (VITAMIN C) 1000 MG tablet Take 1,000 mg by mouth daily.    [name] levothyroxine (SYNTHROID, LEVOTHROID) 125 MCG tablet Take 125 mcg by mouth daily.    . metoprolol succinate (TOPROL-XL) 100 MG 24 hr tablet Take 100 mg by mouth daily.    . Multiple Vitamins-Minerals (MULTIVITAMINS  THER. W/MINERALS) TABS tablet Take 1 tablet by mouth daily.    . nitroGLYCERIN (NITROSTAT) 0.4 MG SL tablet Place 1 tablet (0.4 mg total) under the tongue every 5 (five) minutes x 3 doses as needed for chest pain. 25 tablet 3  . OVER THE COUNTER MEDICATION Tumeric for cholesterol    . traMADol (ULTRAM) 50 MG tablet Take by mouth  every 8 (eight) hours as needed.    . ALPRAZolam (XANAX) 0.5 MG tablet Take 0.5 mg by mouth as needed for anxiety.     . chlorthalidone (HYGROTON) 25 MG tablet TAKE 1 TABLET BY MOUTH DAILY. (Patient not taking: Reported on 09/14/2016) 30 tablet 1   No current facility-administered medications for this visit.     PAST MEDICAL HISTORY: Past Medical History:  Diagnosis Date  . Anxiety   . Heart attack ([facility])   . History of echocardiogram    a. 2D ECHO: 09/21/2013; EF 55-60%; mild concentric hypertrophy, G1DD, mild LA dilation, PA pressure 37. Small RV pericardial effusion  . Hypertension   . Hypothyroidism   . NSTEMI (non-ST elevated myocardial infarction) ([facility])    a.  Related to occlusion of the distal Cx. The mid to distal obtuse marginal #1 also contains a significant stenosis that is best treated with medical therapy. Patent RCA/LAD with tortuosity. LM patent. EF 55%.   . Sleep apnea   . Thyroid disease     PAST SURGICAL HISTORY: Past Surgical History:  Procedure Laterality Date  . ABDOMINAL HYSTERECTOMY    . CARDIAC CATHETERIZATION  ~ 2000  . CORONARY ANGIOPLASTY  09/20/2013  . LEFT HEART CATHETERIZATION WITH CORONARY ANGIOGRAM N/A 09/20/2013   Procedure: LEFT HEART CATHETERIZATION WITH CORONARY ANGIOGRAM;  Surgeon: Henry W Smith III, MD;  Location: [facility] CATH LAB;  Service: Cardiovascular;  Laterality: N/A;  . LEFT HEART CATHETERIZATION WITH CORONARY ANGIOGRAM N/A 09/27/2013   Procedure: LEFT HEART CATHETERIZATION WITH CORONARY ANGIOGRAM;  Surgeon: Michael D Cooper, MD;  Location: [facility] CATH LAB;  Service: Cardiovascular;  Laterality: N/A;  . THYROIDECTOMY  10/08/2011   Procedure: THYROIDECTOMY;  Surgeon: Mark A Jenkins, MD;  Location: [facility] ORS;  Service: General;  Laterality: N/A;  Total  . VAGINAL HYSTERECTOMY  1993    FAMILY HISTORY: No family history on file.  SOCIAL HISTORY:  Social History   Social History  . Marital status: Widowed    Spouse name: N/A  . Number of children:  N/A  . Years of education: N/A   Occupational History  . Not on file.   Social History Main Topics  . Smoking status: Former Smoker  . Smokeless tobacco: Never Used  . Alcohol use No  . Drug use: No  . Sexual activity: No   Other Topics Concern  . Not on file   Social History Narrative   ** Merged History Encounter **  Lives at home with family.  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        PHYSICAL EXAM   Vitals:   09/14/16 0951  BP: (!) 154/91  Pulse: 90  Weight: 187 lb 6.4 oz (85 kg)  Height: 5' 3" (1.6 m)    Not recorded      Body mass index is 33.2 kg/m.  PHYSICAL EXAMNIATION:  Gen: NAD, conversant, well nourised, obese, well groomed                     Cardiovascular: Regular rate rhythm, no peripheral edema, warm, nontender. Eyes: Conjunctivae clear without exudates or hemorrhage   Neck: Supple, no carotid bruits. Pulmonary: Clear to auscultation bilaterally   NEUROLOGICAL EXAM:  MENTAL STATUS: Speech:    Speech is normal; fluent and spontaneous with normal comprehension.  Cognition:     Orientation to time, place and person     Normal recent and remote memory     Normal Attention span and concentration     Normal Language, naming, repeating,spontaneous speech     Fund of knowledge   CRANIAL NERVES: CN II: Visual fields are full to confrontation. Fundoscopic exam is normal with sharp discs and no vascular changes. Pupils are round equal and briskly reactive to light. CN III, IV, VI: extraocular movement are normal. No ptosis. CN V: Facial sensation is intact to pinprick in all 3 divisions bilaterally. Corneal responses are intact.  CN VII: Face is symmetric with normal eye closure and smile. CN VIII: Hearing is normal to rubbing fingers CN IX, X: Palate elevates symmetrically. Phonation is normal. CN XI: Head turning and shoulder shrug are intact CN XII: Tongue is midline with normal movements and no atrophy.  MOTOR: There is no pronator drift of  out-stretched arms. Muscle bulk and tone are normal. Muscle strength is normal.  REFLEXES: Reflexes are 2+ and symmetric at the biceps, triceps, knees, and ankles. Plantar responses are flexor.  SENSORY: Intact to light touch, pinprick, positional sensation and vibratory sensation are intact in fingers and toes.  COORDINATION: Rapid alternating movements and fine finger movements are intact. There is no dysmetria on finger-to-nose and heel-knee-shin.    GAIT/STANCE: Antalgic cautious Romberg is absent.   DIAGNOSTIC DATA (LABS, IMAGING, TESTING) - I reviewed patient records, labs, notes, testing and imaging myself where available.   ASSESSMENT AND PLAN  [name] [name] is a 71 y.o. female    Neck pain, radiating pain to left upper extremity  Need to rule out cervical radiculopathy,  Proceed with MRI of cervical spine,  EMG nerve conduction study Diffuse body achy pain,frequent headaches,  ESR C-reactive protein to rule out inflammatory process  Add on Cymbalta 60 mg daily  Marcial Pacas, M.D. Ph.D.  [facility] [address] [place], [place] [address] Ph: [phone] Fax: [phone]  CC: Rosita Fire, MD

## 2016-09-15 ENCOUNTER — Telehealth: Payer: Self-pay | Admitting: Neurology

## 2016-09-15 LAB — ANA W/REFLEX: Anti Nuclear Antibody(ANA): NEGATIVE

## 2016-09-15 LAB — CK: CK TOTAL: 116 U/L (ref 24–173)

## 2016-09-15 LAB — SEDIMENTATION RATE: SED RATE: 11 mm/h (ref 0–40)

## 2016-09-15 LAB — C-REACTIVE PROTEIN: CRP: 1.6 mg/L (ref 0.0–4.9)

## 2016-09-15 LAB — TSH: TSH: 7.33 u[IU]/mL — AB (ref 0.450–4.500)

## 2016-09-15 NOTE — Telephone Encounter (Signed)
LMVM for pt to return call for lab results.  

## 2016-09-15 NOTE — Telephone Encounter (Signed)
Please call patient, laboratory evaluation showed elevated TSH, which usually indicate under supplement of thyroid hormone, she should continue work with her primary care to optimize levothyroxine dosage.

## 2016-09-16 ENCOUNTER — Telehealth: Payer: Self-pay | Admitting: Neurology

## 2016-09-16 NOTE — Telephone Encounter (Signed)
LMVM for pt to relturn call.

## 2016-09-16 NOTE — Telephone Encounter (Signed)
error 

## 2016-09-16 NOTE — Telephone Encounter (Signed)
I called pt back and LMVM for her with the results per below.  She is to call back if questions.

## 2016-09-16 NOTE — Telephone Encounter (Signed)
Pt returned RN's call °

## 2016-09-20 NOTE — Telephone Encounter (Signed)
Abnormal TSH result discussed with patient.  She will contact her PCP.  Also, faxed lab results to Dr. Josephine Cables office.

## 2016-09-20 NOTE — Telephone Encounter (Signed)
I called the patient and scheduled her MRI at [facility] for Wednesday 09/22/16 arrival time is 11:00 AM patient is aware of time and day. She informed me someone tried to reach out her about some test results and she was waiting on a call back.

## 2016-09-22 ENCOUNTER — Encounter: Payer: Self-pay | Admitting: Neurology

## 2016-09-22 ENCOUNTER — Other Ambulatory Visit: Payer: Self-pay | Admitting: Cardiovascular Disease

## 2016-09-22 DIAGNOSIS — M50222 Other cervical disc displacement at C5-C6 level: Secondary | ICD-10-CM | POA: Diagnosis not present

## 2016-09-22 DIAGNOSIS — M542 Cervicalgia: Secondary | ICD-10-CM | POA: Diagnosis not present

## 2016-09-22 DIAGNOSIS — R202 Paresthesia of skin: Secondary | ICD-10-CM | POA: Diagnosis not present

## 2016-09-22 DIAGNOSIS — M47813 Spondylosis without myelopathy or radiculopathy, cervicothoracic region: Secondary | ICD-10-CM | POA: Diagnosis not present

## 2016-10-01 ENCOUNTER — Encounter (INDEPENDENT_AMBULATORY_CARE_PROVIDER_SITE_OTHER): Payer: Self-pay | Admitting: Neurology

## 2016-10-01 ENCOUNTER — Ambulatory Visit (INDEPENDENT_AMBULATORY_CARE_PROVIDER_SITE_OTHER): Payer: Medicare HMO | Admitting: Neurology

## 2016-10-01 DIAGNOSIS — M542 Cervicalgia: Secondary | ICD-10-CM | POA: Diagnosis not present

## 2016-10-01 DIAGNOSIS — R202 Paresthesia of skin: Secondary | ICD-10-CM | POA: Diagnosis not present

## 2016-10-01 DIAGNOSIS — Z0289 Encounter for other administrative examinations: Secondary | ICD-10-CM

## 2016-10-01 NOTE — Procedures (Signed)
Full Name: Rhylan Gross Gender: Female MRN #: [id number] Date of Birth: 06/01/2045    Visit Date: 10/01/16 11:39 Age: 71 Years 44 Months Old Examining Physician: Marcial Pacas, MD  Referring Physician: Krista Blue,  MD History: 71 years old right-handed female, presenting with diffuse body achy pain, right shoulder pain  Summary of the test:  Nerve conduction study: Right median mixed response with 0.5 millisecond prolonged compared to ipsilateral ulnar mixed response. Right median, ulnar sensory and motor responses were within normal limits.  Right sural, superficial peroneal sensory responses were normal. Right peroneal to EDB, and tibial motor responses were normal.  Electromyography: Selected needle examination of right upper, lower extremity muscle, right cervical, lumbar sacral paraspinal muscles show no significant abnormality.  Conclusion:  This is a mild abnormal study. There is electrodiagnostic evidence of mild median neuropathy across the wrist, consistent with mild right carpal tunnel syndromes. There is no evidence of right cervical radiculopathy, or right lumbosacral radiculopathy.   ------------------------------- Marcial Pacas, M.D.  [facility] [place], [place] [address] Tel: [phone] Fax: [phone]        [facility]    Nerve / Sites Muscle Latency Ref. Amplitude Ref. Rel Amp Segments Distance Velocity Ref. Area    ms ms mV mV %  cm m/s m/s mVms  R Median - APB     Wrist APB 3.6 ?4.4 6.8 ?4.0 100 Wrist - APB 7   23.2     Upper arm APB 7.2  5.6  82.1 Upper arm - Wrist 20 56 ?49 20.8  R Ulnar - ADM     Wrist ADM 2.7 ?3.3 9.2 ?6.0 100 Wrist - ADM 7   21.7     B.Elbow ADM 5.7  6.9  74.9 B.Elbow - Wrist 18 59 ?49 17.1     A.Elbow ADM 7.7  7.3  105 A.Elbow - B.Elbow 12 62 ?49 18.4     Axilla ADM 10.4  7.5  102 Axilla - A.Elbow 16 59  18.6     Supraclav fossa ADM 12.3  6.2  83.3 Supraclav fossa - Axilla 12 61  16.3         A.Elbow -  Wrist      R Peroneal - EDB     Ankle EDB 5.4 ?6.5 2.8 ?2.0 100 Ankle - EDB 9   9.8     Fib head EDB 10.8  2.3  83.1 Fib head - Ankle 30 55 ?44 10.1     Pop fossa EDB 13.0  2.2  95.7 Pop fossa - Fib head 12 54 ?44 9.5         Pop fossa - Ankle      R Tibial - AH     Ankle AH 4.3 ?5.8 11.4 ?4.0 100 Ankle - AH 9   22.6     Pop fossa AH 11.8  8.4  73.3 Pop fossa - Ankle 35 47 ?41 21.9             SNC    Nerve / Sites Rec. Site Peak Lat Ref.  Amp Ref. Segments Distance Peak Diff Ref.    ms ms V V  cm ms ms  R Sural - Ankle (Calf)     Calf Ankle 4.1 ?4.4 11 ?6 Calf - Ankle 14    R Superficial peroneal - Ankle     Lat leg Ankle 4.4 ?4.4 10 ?6 Lat leg - Ankle 14    R  Median, Ulnar - Transcarpal comparison     Median Palm Wrist 2.4 ?2.2 71 ?35 Median Palm - Wrist 8       Ulnar Palm Wrist 1.9 ?2.2 17 ?12 Ulnar Palm - Wrist 8          Median Palm - Ulnar Palm  0.5 ?0.4  R Median - Orthodromic (Dig II, Mid palm)     Dig II Wrist 3.2 ?3.4 23 ?10 Dig II - Wrist 13    R Ulnar - Orthodromic, (Dig V, Mid palm)     Dig V Wrist 2.8 ?3.1 13 ?5 Dig V - Wrist 63                 F  Wave    Nerve F Lat Ref.   ms ms  R Tibial - AH 47.4 ?56.0  R Ulnar - ADM 25.6 ?32.0         EMG full       EMG Summary Table    Spontaneous MUAP Recruitment  Muscle IA Fib PSW Fasc Other Amp Dur. Poly Pattern  R. First dorsal interosseous Normal None None None _______ Normal Normal Normal Normal  R. Pronator teres Normal None None None _______ Normal Normal Normal Normal  R. Abductor pollicis brevis Normal None None None _______ Normal Normal Normal Normal  R. Biceps brachii Normal None None None _______ Normal Normal Normal Normal  R. Deltoid Normal None None None _______ Normal Normal Normal Normal  R. Cervical paraspinals Normal None None None _______ Normal Normal Normal Normal

## 2016-10-01 NOTE — Progress Notes (Signed)
PATIENT: [name] [name] DOB: Aug 10, 1945  No chief complaint on file.    HISTORICAL  [name] [name] is a 71 years old right-handed female, seen in refer by her primary care doctor [name] [name] for evaluation of cervical radiculopathy, whole body achy pain, initial evaluation was September 14 2016.  Have reviewed and summarized referring note, she had a history of coronary artery disease, she is not taking aspirin regularly, she believes in natural supplement, hypertension (but only take half of the prescribed medications), hypothyroidism, chronic insomnia, hyperlipidemia,   She fell at the store on August 20 2016 1pm while returning her DVD, she tripped and fell on the floor, her knees landed first, she then rolled to her eft side, she denied loss of consciousness, she was able to drive back home without any difficulty  But upon arriving home, she noticed diffuse body achy pain, difficulty using her arm, moving her neck, radiating pain to her left shoulder,  she is urged by her family members to be evaluated, she presented to local emergency room  I reviewed emergency department evaluation from [facility] on August 21 2016, she presented to the emergency room complains of constant dull left-sided neck pain, since she fell and hit her left side of her head the night before, she felt pins and needle into her left arm, this happened since the incident has been persistent, upon presentation blood pressure 137/104, heart rate of 87,   right wrist with chest x-ray was negative,  Laboratory evaluations normal CMP, creatinine of 0.74, most CBC hemoglobin of 15.6, she was given prescription of tramadol 10 tablet, prednisone 20 mg 3 tablets  CT of abdomen and pelvis with contrast showed super umbilical hernia containing assessment of transverse colon without evidence of obstruction all active inflammation,  Cervical x-ray, and thoracic x-ray showed mild degenerative changes no  evidence of acute fracture   I reviewed CT report on August 21 2016, CT head without contrast showed no acute abnormality, skull showed  no evidence of fracture or soft tissue swelling. Mild small vessel disease. CT of cervical spine showed normal alignment, mild to moderate multilevel degenerative disc disease, no acute abnormality  Since the event, she complains of achiness all over, difficult to perform all household chore, such as vacuuming,difficulty sleeping, weakness, knees give out underneath her, intermittent numbness of bilateral lower extremity, neck pain, going down both side of her shoulders,more to the left side, radiating numbness to her left hand, also complains of frequent bilateral frontal headaches,  Review of system: 14 system review was performed, pertinent are: Headache, dizziness, not enough sleep, joint pain, achy muscles, allergy, runny nose  ALLERGIES: Allergies  Allergen Reactions  . Amlodipine     Lip swelling per patient.   . Lisinopril     Dr. Legrand Rams stopped due to side effects per patient.  Could remember what side effects were.    [name] [Ranolazine]     Chest pain    HOME MEDICATIONS: Current Outpatient Prescriptions  Medication Sig Dispense Refill  . ALPRAZolam (XANAX) 0.5 MG tablet Take 0.5 mg by mouth as needed for anxiety.     . Ascorbic Acid (VITAMIN C) 1000 MG tablet Take 1,000 mg by mouth daily.    . chlorthalidone (HYGROTON) 25 MG tablet TAKE ONE TABLET BY MOUTH EVERY DAY 30 tablet 3  . clonazePAM (KLONOPIN) 0.5 MG tablet Take 1 tablet (0.5 mg total) by mouth at bedtime as needed for anxiety. 30 tablet 0  .  DULoxetine (CYMBALTA) 60 MG capsule Take 1 capsule (60 mg total) by mouth daily. 30 capsule 12  . levothyroxine (SYNTHROID, LEVOTHROID) 125 MCG tablet Take 125 mcg by mouth daily.    . metoprolol succinate (TOPROL-XL) 100 MG 24 hr tablet Take 100 mg by mouth daily.    . Multiple Vitamins-Minerals (MULTIVITAMINS THER. W/MINERALS) TABS tablet  Take 1 tablet by mouth daily.    . nitroGLYCERIN (NITROSTAT) 0.4 MG SL tablet Place 1 tablet (0.4 mg total) under the tongue every 5 (five) minutes x 3 doses as needed for chest pain. 25 tablet 3  . OVER THE COUNTER MEDICATION Tumeric for cholesterol    . traMADol (ULTRAM) 50 MG tablet Take by mouth every 8 (eight) hours as needed.     No current facility-administered medications for this visit.     PAST MEDICAL HISTORY: Past Medical History:  Diagnosis Date  . Anxiety   . Heart attack ([place])   . History of echocardiogram    a. 2D ECHO: 09/21/2013; EF 55-60%; mild concentric hypertrophy, G1DD, mild LA dilation, PA pressure 37. Small RV pericardial effusion  . Hypertension   . Hypothyroidism   . NSTEMI (non-ST elevated myocardial infarction) ([place])    a.  Related to occlusion of the distal Cx. The mid to distal obtuse marginal #1 also contains a significant stenosis that is best treated with medical therapy. Patent RCA/LAD with tortuosity. LM patent. EF 55%.   . Sleep apnea   . Thyroid disease     PAST SURGICAL HISTORY: Past Surgical History:  Procedure Laterality Date  . ABDOMINAL HYSTERECTOMY    . CARDIAC CATHETERIZATION  ~ 2000  . CORONARY ANGIOPLASTY  09/20/2013  . LEFT HEART CATHETERIZATION WITH CORONARY ANGIOGRAM N/A 09/20/2013   Procedure: LEFT HEART CATHETERIZATION WITH CORONARY ANGIOGRAM;  Surgeon: Sinclair Grooms, MD;  Location: [facility] CATH LAB;  Service: Cardiovascular;  Laterality: N/A;  . LEFT HEART CATHETERIZATION WITH CORONARY ANGIOGRAM N/A 09/27/2013   Procedure: LEFT HEART CATHETERIZATION WITH CORONARY ANGIOGRAM;  Surgeon: Blane Ohara, MD;  Location: [facility] CATH LAB;  Service: Cardiovascular;  Laterality: N/A;  . THYROIDECTOMY  10/08/2011   Procedure: THYROIDECTOMY;  Surgeon: [name], MD;  Location: [facility] ORS;  Service: General;  Laterality: N/A;  Total  . VAGINAL HYSTERECTOMY  1993    FAMILY HISTORY: No family history on file.  SOCIAL HISTORY:  Social History     Social History  . Marital status: Widowed    Spouse name: N/A  . Number of children: N/A  . Years of education: N/A   Occupational History  . Not on file.   Social History Main Topics  . Smoking status: Former Research scientist (life sciences)  . Smokeless tobacco: Never Used  . Alcohol use No  . Drug use: No  . Sexual activity: No   Other Topics Concern  . Not on file   Social History Narrative   ** Merged History Encounter **  Lives at home with family.  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        PHYSICAL EXAM   There were no vitals filed for this visit.  Not recorded      There is no height or weight on file to calculate BMI.  PHYSICAL EXAMNIATION:  Gen: NAD, conversant, well nourised, obese, well groomed                     Cardiovascular: Regular rate rhythm, no peripheral edema, warm, nontender. Eyes: Conjunctivae clear without exudates or  hemorrhage Neck: Supple, no carotid bruits. Pulmonary: Clear to auscultation bilaterally   NEUROLOGICAL EXAM:  MENTAL STATUS: Speech:    Speech is normal; fluent and spontaneous with normal comprehension.  Cognition:     Orientation to time, place and person     Normal recent and remote memory     Normal Attention span and concentration     Normal Language, naming, repeating,spontaneous speech     Fund of knowledge   CRANIAL NERVES: CN II: Visual fields are full to confrontation. Fundoscopic exam is normal with sharp discs and no vascular changes. Pupils are round equal and briskly reactive to light. CN III, IV, VI: extraocular movement are normal. No ptosis. CN V: Facial sensation is intact to pinprick in all 3 divisions bilaterally. Corneal responses are intact.  CN VII: Face is symmetric with normal eye closure and smile. CN VIII: Hearing is normal to rubbing fingers CN IX, X: Palate elevates symmetrically. Phonation is normal. CN XI: Head turning and shoulder shrug are intact CN XII: Tongue is midline with normal movements and no  atrophy.  MOTOR: There is no pronator drift of out-stretched arms. Muscle bulk and tone are normal. Muscle strength is normal.  REFLEXES: Reflexes are 2+ and symmetric at the biceps, triceps, knees, and ankles. Plantar responses are flexor.  SENSORY: Intact to light touch, pinprick, positional sensation and vibratory sensation are intact in fingers and toes.  COORDINATION: Rapid alternating movements and fine finger movements are intact. There is no dysmetria on finger-to-nose and heel-knee-shin.    GAIT/STANCE: Antalgic cautious Romberg is absent.   DIAGNOSTIC DATA (LABS, IMAGING, TESTING) - I reviewed patient records, labs, notes, testing and imaging myself where available.   ASSESSMENT AND PLAN  [name] [name] is a 71 y.o. female    Neck pain, radiating pain to left upper extremity  Need to rule out cervical radiculopathy,  Proceed with MRI of cervical spine,  EMG nerve conduction study Diffuse body achy pain,frequent headaches,  ESR C-reactive protein to rule out inflammatory process  Add on Cymbalta 60 mg daily  Marcial Pacas, M.D. Ph.D.  [facility] [address], [place], [place] [address] Ph: [phone] Fax: [phone]  CC: [name] Fire, MD

## 2016-10-12 ENCOUNTER — Telehealth: Payer: Self-pay | Admitting: Orthopaedic Surgery

## 2016-10-12 NOTE — Telephone Encounter (Signed)
Patient called in to inquire about scheduling appointment; states primary care, Dr Legrand Rams, faxing a referral. Verified referral received (we had been out of office since Friday, 10/08/16 through holiday 10/11/16.) Offered appointment upon receiving records and films from [facility]) for our providers to review. Appointment pending.

## 2016-10-13 DIAGNOSIS — G4700 Insomnia, unspecified: Secondary | ICD-10-CM | POA: Diagnosis not present

## 2016-10-13 DIAGNOSIS — J309 Allergic rhinitis, unspecified: Secondary | ICD-10-CM | POA: Diagnosis not present

## 2016-10-13 DIAGNOSIS — I214 Non-ST elevation (NSTEMI) myocardial infarction: Secondary | ICD-10-CM | POA: Diagnosis not present

## 2016-10-13 DIAGNOSIS — Z6832 Body mass index (BMI) 32.0-32.9, adult: Secondary | ICD-10-CM | POA: Diagnosis not present

## 2016-10-13 DIAGNOSIS — I2510 Atherosclerotic heart disease of native coronary artery without angina pectoris: Secondary | ICD-10-CM | POA: Diagnosis not present

## 2016-10-13 DIAGNOSIS — E039 Hypothyroidism, unspecified: Secondary | ICD-10-CM | POA: Diagnosis not present

## 2016-10-13 DIAGNOSIS — I10 Essential (primary) hypertension: Secondary | ICD-10-CM | POA: Diagnosis not present

## 2016-10-13 NOTE — Telephone Encounter (Signed)
10/13/16 called back to patient to follow up; referral received; pending reports and films from [facility]. Left message.

## 2016-10-15 ENCOUNTER — Telehealth: Payer: Self-pay | Admitting: Neurology

## 2016-10-15 NOTE — Telephone Encounter (Signed)
I reviewed MRI of cervical spine report on September 22 2016, there is no acute abnormality, small broad disc protrusion at C5-6 with mild canal or foraminal impingement, mild to moderate left facet arthritis at C7-T1.

## 2016-10-18 ENCOUNTER — Other Ambulatory Visit (HOSPITAL_COMMUNITY): Payer: Self-pay | Admitting: Internal Medicine

## 2016-10-18 DIAGNOSIS — Z1231 Encounter for screening mammogram for malignant neoplasm of breast: Secondary | ICD-10-CM

## 2016-10-19 NOTE — Telephone Encounter (Signed)
Patient responded; has brought in films from [facility]; appointment being scheduled.

## 2016-10-28 ENCOUNTER — Ambulatory Visit (HOSPITAL_COMMUNITY): Payer: Self-pay

## 2016-11-03 ENCOUNTER — Ambulatory Visit: Payer: Self-pay | Admitting: Orthopedic Surgery

## 2016-11-08 DIAGNOSIS — K439 Ventral hernia without obstruction or gangrene: Secondary | ICD-10-CM | POA: Diagnosis not present

## 2016-11-08 DIAGNOSIS — E039 Hypothyroidism, unspecified: Secondary | ICD-10-CM | POA: Diagnosis not present

## 2016-11-08 DIAGNOSIS — I2510 Atherosclerotic heart disease of native coronary artery without angina pectoris: Secondary | ICD-10-CM | POA: Diagnosis not present

## 2016-11-08 DIAGNOSIS — J019 Acute sinusitis, unspecified: Secondary | ICD-10-CM | POA: Diagnosis not present

## 2016-11-08 DIAGNOSIS — I10 Essential (primary) hypertension: Secondary | ICD-10-CM | POA: Diagnosis not present

## 2016-11-10 ENCOUNTER — Ambulatory Visit (INDEPENDENT_AMBULATORY_CARE_PROVIDER_SITE_OTHER): Payer: Medicare HMO

## 2016-11-10 ENCOUNTER — Encounter: Payer: Self-pay | Admitting: Orthopedic Surgery

## 2016-11-10 ENCOUNTER — Ambulatory Visit (INDEPENDENT_AMBULATORY_CARE_PROVIDER_SITE_OTHER): Payer: Medicare HMO | Admitting: Orthopedic Surgery

## 2016-11-10 VITALS — BP 161/86 | HR 76 | Resp 18 | Ht 63.0 in | Wt 182.0 lb

## 2016-11-10 DIAGNOSIS — M545 Low back pain: Secondary | ICD-10-CM | POA: Diagnosis not present

## 2016-11-10 NOTE — Progress Notes (Signed)
Patient ID: [name] [name], female   DOB: 1945-09-04, 71 y.o.   MRN: [id number]  Chief Complaint  Patient presents with  . Fall    patient states she fell in video store on 08/20/16 has pain all over after her fall, has had imaging study of neck     HPI [name] [name] is a 71 y.o. female.  She comes in today complaining of lower back pain with pain radiating down her right leg. She was fine until she fell. She feels a tingling sensation in the right leg the entire leg hurts it feels heavy. The pain is moderate to severe constant worse with extension she's having trouble bending forward as well  Review of Systems Review of Systems  Constitutional: Negative for activity change, fever and unexpected weight change.  Gastrointestinal: Positive for abdominal pain.  Neurological: Positive for numbness.    Past Medical History:  Diagnosis Date  . Anxiety   . Heart attack ([place])   . History of echocardiogram    a. 2D ECHO: 09/21/2013; EF 55-60%; mild concentric hypertrophy, G1DD, mild LA dilation, PA pressure 37. Small RV pericardial effusion  . Hypertension   . Hypothyroidism   . NSTEMI (non-ST elevated myocardial infarction) ([place])    a.  Related to occlusion of the distal Cx. The mid to distal obtuse marginal #1 also contains a significant stenosis that is best treated with medical therapy. Patent RCA/LAD with tortuosity. LM patent. EF 55%.   . Sleep apnea   . Thyroid disease     Past Surgical History:  Procedure Laterality Date  . ABDOMINAL HYSTERECTOMY    . CARDIAC CATHETERIZATION  ~ 2000  . CORONARY ANGIOPLASTY  09/20/2013  . LEFT HEART CATHETERIZATION WITH CORONARY ANGIOGRAM N/A 09/20/2013   Procedure: LEFT HEART CATHETERIZATION WITH CORONARY ANGIOGRAM;  Surgeon: Sinclair Grooms, MD;  Location: [facility] CATH LAB;  Service: Cardiovascular;  Laterality: N/A;  . LEFT HEART CATHETERIZATION WITH CORONARY ANGIOGRAM N/A 09/27/2013   Procedure: LEFT HEART CATHETERIZATION WITH CORONARY  ANGIOGRAM;  Surgeon: Blane Ohara, MD;  Location: [facility] CATH LAB;  Service: Cardiovascular;  Laterality: N/A;  . THYROIDECTOMY  10/08/2011   Procedure: THYROIDECTOMY;  Surgeon: [name], MD;  Location: [facility] ORS;  Service: General;  Laterality: N/A;  Total  . VAGINAL HYSTERECTOMY  1993    No family history on file. was reviewed  Social History Social History  Substance Use Topics  . Smoking status: Former Research scientist (life sciences)  . Smokeless tobacco: Never Used  . Alcohol use No    Allergies  Allergen Reactions  . Amlodipine     Lip swelling per patient.   . Lisinopril     Dr. Legrand Rams stopped due to side effects per patient.  Could remember what side effects were.    [name] [Ranolazine]     Chest pain    Current Outpatient Prescriptions  Medication Sig Dispense Refill  . levothyroxine (SYNTHROID, LEVOTHROID) 125 MCG tablet Take 125 mcg by mouth daily.    . metoprolol succinate (TOPROL-XL) 100 MG 24 hr tablet Take 100 mg by mouth daily.    [name] ALPRAZolam (XANAX) 0.5 MG tablet Take 0.5 mg by mouth as needed for anxiety.     . Ascorbic Acid (VITAMIN C) 1000 MG tablet Take 1,000 mg by mouth daily.    . chlorthalidone (HYGROTON) 25 MG tablet TAKE ONE TABLET BY MOUTH EVERY DAY (Patient not taking: Reported on 11/10/2016) 30 tablet 3  . clonazePAM (KLONOPIN) 0.5 MG tablet Take  1 tablet (0.5 mg total) by mouth at bedtime as needed for anxiety. (Patient not taking: Reported on 11/10/2016) 30 tablet 0  . DULoxetine (CYMBALTA) 60 MG capsule Take 1 capsule (60 mg total) by mouth daily. (Patient not taking: Reported on 11/10/2016) 30 capsule 12  . metoprolol tartrate (LOPRESSOR) 100 MG tablet Take 100 mg by mouth.    . Multiple Vitamins-Minerals (MULTIVITAMINS THER. W/MINERALS) TABS tablet Take 1 tablet by mouth daily.    . nitroGLYCERIN (NITROSTAT) 0.4 MG SL tablet Place 1 tablet (0.4 mg total) under the tongue every 5 (five) minutes x 3 doses as needed for chest pain. (Patient not taking: Reported on  11/10/2016) 25 tablet 3  . OVER THE COUNTER MEDICATION Tumeric for cholesterol    . potassium chloride SA (K-DUR,KLOR-CON) 20 MEQ tablet     . rosuvastatin (CRESTOR) 5 MG tablet Take 5 mg by mouth daily.  1  . traMADol (ULTRAM) 50 MG tablet Take by mouth every 8 (eight) hours as needed.     No current facility-administered medications for this visit.        Physical Exam BP (!) 161/86   Pulse 76   Resp 18   Ht 5\' 3"  (1.6 m)   Wt 182 lb (82.6 kg)   BMI 32.24 kg/m  Physical Exam Gen. appearance: The patient is well-developed and well-nourished grooming and hygiene are normal The patient is oriented to person place and time The patient's mood is normal and the affect is normal  Ortho Exam Gait assessment: The patient stands with normal gait and station  Lumbar spine Tenderness  to palpation is noted in the lower L4-5 and 5 S1 segment  Range of motion  Painful extension decreased flexion and extension painful rotation to the right pain decreases with rotation to the left Muscle tone normal muscle tone on the right and left sides of the spine  Lower extremities right and left Normal range of motion hip knee and ankle All 3 joints are reduced and stable  Strength right lower extremity normal Strength left lower extremity normal  Neurologic right lower extremity examination  Reflexes were 2+ and equal at the knee and 1+ and equal at the ankle    Sensation was normal in both feet and legs    Babinski's tests were down going  Straight leg raise testing was negative  normal bilaterally  The vascular examination revealed normal dorsalis pedis pulses in both feet and both feet were warm with good capillary refill   Data Reviewed X-ray show spondylosis of the L4-S1 facet joints with normal spinal alignment  Assessment  Encounter Diagnosis  Name Primary?  . Acute right-sided low back pain, with sciatica presence unspecified Yes     Plan  Physical therapy and  gabapentin follow-up 6 weeks  Patient cannot take anti-inflammatories or Tylenol upsets her stomach   Arther Abbott, MD 11/10/2016 11:12 AM

## 2016-11-10 NOTE — Patient Instructions (Addendum)

## 2016-11-12 DIAGNOSIS — M6281 Muscle weakness (generalized): Secondary | ICD-10-CM | POA: Diagnosis not present

## 2016-11-12 DIAGNOSIS — I10 Essential (primary) hypertension: Secondary | ICD-10-CM | POA: Diagnosis not present

## 2016-11-12 DIAGNOSIS — R110 Nausea: Secondary | ICD-10-CM | POA: Diagnosis not present

## 2016-11-12 DIAGNOSIS — D72829 Elevated white blood cell count, unspecified: Secondary | ICD-10-CM | POA: Diagnosis not present

## 2016-11-12 DIAGNOSIS — R262 Difficulty in walking, not elsewhere classified: Secondary | ICD-10-CM | POA: Diagnosis not present

## 2016-11-12 DIAGNOSIS — E039 Hypothyroidism, unspecified: Secondary | ICD-10-CM | POA: Diagnosis not present

## 2016-11-12 DIAGNOSIS — K436 Other and unspecified ventral hernia with obstruction, without gangrene: Secondary | ICD-10-CM | POA: Diagnosis not present

## 2016-11-12 DIAGNOSIS — Z79899 Other long term (current) drug therapy: Secondary | ICD-10-CM | POA: Diagnosis not present

## 2016-11-12 DIAGNOSIS — R1013 Epigastric pain: Secondary | ICD-10-CM | POA: Diagnosis not present

## 2016-11-16 DIAGNOSIS — R110 Nausea: Secondary | ICD-10-CM | POA: Diagnosis not present

## 2016-11-16 DIAGNOSIS — Z79899 Other long term (current) drug therapy: Secondary | ICD-10-CM | POA: Diagnosis not present

## 2016-11-16 DIAGNOSIS — R1013 Epigastric pain: Secondary | ICD-10-CM | POA: Diagnosis not present

## 2016-11-16 DIAGNOSIS — M6281 Muscle weakness (generalized): Secondary | ICD-10-CM | POA: Diagnosis not present

## 2016-11-16 DIAGNOSIS — D72829 Elevated white blood cell count, unspecified: Secondary | ICD-10-CM | POA: Diagnosis not present

## 2016-11-16 DIAGNOSIS — K439 Ventral hernia without obstruction or gangrene: Secondary | ICD-10-CM | POA: Diagnosis not present

## 2016-11-16 DIAGNOSIS — I10 Essential (primary) hypertension: Secondary | ICD-10-CM | POA: Diagnosis not present

## 2016-11-16 DIAGNOSIS — R262 Difficulty in walking, not elsewhere classified: Secondary | ICD-10-CM | POA: Diagnosis not present

## 2016-11-16 DIAGNOSIS — E039 Hypothyroidism, unspecified: Secondary | ICD-10-CM | POA: Diagnosis not present

## 2016-11-16 DIAGNOSIS — K436 Other and unspecified ventral hernia with obstruction, without gangrene: Secondary | ICD-10-CM | POA: Diagnosis not present

## 2016-11-17 DIAGNOSIS — R262 Difficulty in walking, not elsewhere classified: Secondary | ICD-10-CM | POA: Diagnosis not present

## 2016-11-17 DIAGNOSIS — K436 Other and unspecified ventral hernia with obstruction, without gangrene: Secondary | ICD-10-CM | POA: Diagnosis not present

## 2016-11-17 DIAGNOSIS — R110 Nausea: Secondary | ICD-10-CM | POA: Diagnosis not present

## 2016-11-17 DIAGNOSIS — Z79899 Other long term (current) drug therapy: Secondary | ICD-10-CM | POA: Diagnosis not present

## 2016-11-17 DIAGNOSIS — E039 Hypothyroidism, unspecified: Secondary | ICD-10-CM | POA: Diagnosis not present

## 2016-11-17 DIAGNOSIS — M6281 Muscle weakness (generalized): Secondary | ICD-10-CM | POA: Diagnosis not present

## 2016-11-17 DIAGNOSIS — R1013 Epigastric pain: Secondary | ICD-10-CM | POA: Diagnosis not present

## 2016-11-17 DIAGNOSIS — I10 Essential (primary) hypertension: Secondary | ICD-10-CM | POA: Diagnosis not present

## 2016-11-17 DIAGNOSIS — D72829 Elevated white blood cell count, unspecified: Secondary | ICD-10-CM | POA: Diagnosis not present

## 2016-11-18 DIAGNOSIS — K436 Other and unspecified ventral hernia with obstruction, without gangrene: Secondary | ICD-10-CM | POA: Diagnosis not present

## 2016-11-18 DIAGNOSIS — R262 Difficulty in walking, not elsewhere classified: Secondary | ICD-10-CM | POA: Diagnosis not present

## 2016-11-18 DIAGNOSIS — D72829 Elevated white blood cell count, unspecified: Secondary | ICD-10-CM | POA: Diagnosis not present

## 2016-11-18 DIAGNOSIS — E039 Hypothyroidism, unspecified: Secondary | ICD-10-CM | POA: Diagnosis not present

## 2016-11-18 DIAGNOSIS — I10 Essential (primary) hypertension: Secondary | ICD-10-CM | POA: Diagnosis not present

## 2016-11-18 DIAGNOSIS — M6281 Muscle weakness (generalized): Secondary | ICD-10-CM | POA: Diagnosis not present

## 2016-11-18 DIAGNOSIS — R110 Nausea: Secondary | ICD-10-CM | POA: Diagnosis not present

## 2016-11-18 DIAGNOSIS — R1013 Epigastric pain: Secondary | ICD-10-CM | POA: Diagnosis not present

## 2016-11-18 DIAGNOSIS — Z79899 Other long term (current) drug therapy: Secondary | ICD-10-CM | POA: Diagnosis not present

## 2016-12-22 ENCOUNTER — Ambulatory Visit: Payer: Self-pay | Admitting: Orthopedic Surgery

## 2017-02-02 ENCOUNTER — Other Ambulatory Visit: Payer: Self-pay | Admitting: Internal Medicine

## 2017-02-02 DIAGNOSIS — Z1231 Encounter for screening mammogram for malignant neoplasm of breast: Secondary | ICD-10-CM

## 2017-02-12 ENCOUNTER — Encounter (HOSPITAL_COMMUNITY): Payer: Self-pay | Admitting: *Deleted

## 2017-02-12 ENCOUNTER — Emergency Department (HOSPITAL_COMMUNITY): Payer: Medicare HMO

## 2017-02-12 ENCOUNTER — Emergency Department (HOSPITAL_COMMUNITY)
Admission: EM | Admit: 2017-02-12 | Discharge: 2017-02-13 | Disposition: A | Discharge status: Home / Self Care | Payer: Medicare HMO | Attending: Emergency Medicine | Admitting: Emergency Medicine

## 2017-02-12 DIAGNOSIS — Z87891 Personal history of nicotine dependence: Secondary | ICD-10-CM | POA: Insufficient documentation

## 2017-02-12 DIAGNOSIS — I2510 Atherosclerotic heart disease of native coronary artery without angina pectoris: Secondary | ICD-10-CM | POA: Insufficient documentation

## 2017-02-12 DIAGNOSIS — R110 Nausea: Secondary | ICD-10-CM | POA: Diagnosis not present

## 2017-02-12 DIAGNOSIS — E039 Hypothyroidism, unspecified: Secondary | ICD-10-CM | POA: Insufficient documentation

## 2017-02-12 DIAGNOSIS — I10 Essential (primary) hypertension: Secondary | ICD-10-CM

## 2017-02-12 DIAGNOSIS — R079 Chest pain, unspecified: Secondary | ICD-10-CM | POA: Diagnosis not present

## 2017-02-12 DIAGNOSIS — Z79899 Other long term (current) drug therapy: Secondary | ICD-10-CM | POA: Insufficient documentation

## 2017-02-12 DIAGNOSIS — R072 Precordial pain: Secondary | ICD-10-CM | POA: Diagnosis not present

## 2017-02-12 DIAGNOSIS — R0602 Shortness of breath: Secondary | ICD-10-CM | POA: Diagnosis not present

## 2017-02-12 LAB — BASIC METABOLIC PANEL
ANION GAP: 11 (ref 5–15)
BUN: 14 mg/dL (ref 6–20)
CHLORIDE: 106 mmol/L (ref 101–111)
CO2: 25 mmol/L (ref 22–32)
Calcium: 10 mg/dL (ref 8.9–10.3)
Creatinine, Ser: 0.87 mg/dL (ref 0.44–1.00)
GFR calc Af Amer: >60 mL/min (ref >60)
GFR calc non Af Amer: >60 mL/min (ref >60)
GLUCOSE: 89 mg/dL (ref 65–99)
POTASSIUM: 3.5 mmol/L (ref 3.5–5.1)
Sodium: 142 mmol/L (ref 135–145)

## 2017-02-12 LAB — CBC
HEMATOCRIT: 47.6 % — AB (ref 36.0–46.0)
HEMOGLOBIN: 15.6 g/dL — AB (ref 12.0–15.0)
MCH: 30.4 pg (ref 26.0–34.0)
MCHC: 32.8 g/dL (ref 30.0–36.0)
MCV: 92.6 fL (ref 78.0–100.0)
Platelets: 536 10*3/uL — ABNORMAL HIGH (ref 150–400)
RBC: 5.14 MIL/uL — ABNORMAL HIGH (ref 3.87–5.11)
RDW: 15.5 % (ref 11.5–15.5)
WBC: 7 10*3/uL (ref 4.0–10.5)

## 2017-02-12 LAB — TROPONIN I: Troponin I: <0.03 ng/mL (ref <0.03)

## 2017-02-12 NOTE — ED Notes (Signed)
Pt states she feels "much better" 

## 2017-02-12 NOTE — ED Notes (Signed)
Pt does not want to have a chest xray at this time, wait on EDP eval first.

## 2017-02-12 NOTE — ED Triage Notes (Signed)
Pt reports onset of central chest pain x 3 days, pain is worse today. She has also had some nausea and dry heaves.

## 2017-02-12 NOTE — ED Notes (Addendum)
Pt requesting pain meds. Pt reports pain in her mid chest when she moves around.

## 2017-02-13 LAB — TROPONIN I: Troponin I: <0.03 ng/mL (ref <0.03)

## 2017-02-13 MED ORDER — TRAMADOL HCL 50 MG PO TABS
50.0000 mg | ORAL_TABLET | Freq: Once | ORAL | Status: AC
  Administered 2017-02-13: 50 mg via ORAL
  Filled 2017-02-13: qty 1

## 2017-02-13 MED ORDER — TRAMADOL HCL 50 MG PO TABS: 50.0000 mg | ORAL_TABLET | Freq: Four times a day (QID) | ORAL | 0 refills | Status: DC | PRN

## 2017-02-13 NOTE — ED Provider Notes (Addendum)
[facility] EMERGENCY DEPARTMENT Provider Note   CSN: [id number] Arrival date & time: 02/12/17  2001     History   Chief Complaint Chief Complaint  Patient presents with  . Chest Pain    HPI [name] [name] is a 72 y.o. female.  Patient presents with several complaints.  Not feeling well for the past several days.  Had some congestion.  Some discomfort in the bilateral chest area.  And then this this evening at about 1900 had a sharp substernal chest pain that has been was persistent for a while and now easing off.  Patient has a history of a questionable non-STEMI in the past but does not have any stents.  Is followed by cardiology in [place] once a year.  Patient came in because of the chest pain.  Otherwise she went of been concerned about the other symptoms.  Associated with some intermittent shortness of breath but now resolved.  Associated with some ongoing nausea for the past few days but not worse today.  Pain is nonradiating.  Patient states that she has increased pain with movement suggestive of the chest wall pain.      Past Medical History:  Diagnosis Date  . Anxiety   . Heart attack ([place])   . History of echocardiogram    a. 2D ECHO: 09/21/2013; EF 55-60%; mild concentric hypertrophy, G1DD, mild LA dilation, PA pressure 37. Small RV pericardial effusion  . Hypertension   . Hypothyroidism   . NSTEMI (non-ST elevated myocardial infarction) ([place])    a.  Related to occlusion of the distal Cx. The mid to distal obtuse marginal #1 also contains a significant stenosis that is best treated with medical therapy. Patent RCA/LAD with tortuosity. LM patent. EF 55%.   . Sleep apnea   . Thyroid disease     Patient Active Problem List   Diagnosis Date Noted  . Neck pain 09/14/2016  . Paresthesia 09/14/2016  . Chest pain 09/26/2013  . HTN (hypertension) 09/22/2013  . Hypothyroidism   . Sleep apnea   . Anxiety   . NSTEMI (non-ST elevated myocardial infarction) ([place])  09/20/2013  . HLD (hyperlipidemia) 01/17/2008  . Essential hypertension 01/17/2008  . CORONARY ARTERY DISEASE 01/17/2008    Past Surgical History:  Procedure Laterality Date  . ABDOMINAL HYSTERECTOMY    . CARDIAC CATHETERIZATION  ~ 2000  . CORONARY ANGIOPLASTY  09/20/2013  . LEFT HEART CATHETERIZATION WITH CORONARY ANGIOGRAM N/A 09/20/2013   Procedure: LEFT HEART CATHETERIZATION WITH CORONARY ANGIOGRAM;  Surgeon: Sinclair Grooms, MD;  Location: [facility] CATH LAB;  Service: Cardiovascular;  Laterality: N/A;  . LEFT HEART CATHETERIZATION WITH CORONARY ANGIOGRAM N/A 09/27/2013   Procedure: LEFT HEART CATHETERIZATION WITH CORONARY ANGIOGRAM;  Surgeon: Blane Ohara, MD;  Location: [facility] CATH LAB;  Service: Cardiovascular;  Laterality: N/A;  . THYROIDECTOMY  10/08/2011   Procedure: THYROIDECTOMY;  Surgeon: [name], MD;  Location: [facility] ORS;  Service: General;  Laterality: N/A;  Total  . VAGINAL HYSTERECTOMY  1993    OB History    Gravida Para Term Preterm AB Living   2 2 2  0 0     SAB TAB Ectopic Multiple Live Births   0 0 0           Home Medications    Prior to Admission medications   Medication Sig Start Date End Date Taking? Authorizing Provider  Ascorbic Acid (VITAMIN C) 1000 MG tablet Take 1,000 mg by mouth daily.   Yes [provider]  levothyroxine (SYNTHROID, LEVOTHROID) 125 MCG tablet Take 125 mcg by mouth daily. 02/25/16  Yes [provider]  metoprolol succinate (TOPROL-XL) 100 MG 24 hr tablet Take 100 mg by mouth daily. 02/25/16  Yes [provider]  Multiple Vitamins-Minerals (MULTIVITAMINS THER. W/MINERALS) TABS tablet Take 1 tablet by mouth daily.   Yes [provider]  nitroGLYCERIN (NITROSTAT) 0.4 MG SL tablet Place 1 tablet (0.4 mg total) under the tongue every 5 (five) minutes x 3 doses as needed for chest pain. 06/03/14  Yes Herminio Commons, MD  chlorthalidone (HYGROTON) 25 MG tablet TAKE ONE TABLET BY MOUTH EVERY DAY Patient  not taking: Reported on 11/10/2016 09/22/16   Herminio Commons, MD  clonazePAM (KLONOPIN) 0.5 MG tablet Take 1 tablet (0.5 mg total) by mouth at bedtime as needed for anxiety. Patient not taking: Reported on 11/10/2016 09/14/16   Marcial Pacas, MD  DULoxetine (CYMBALTA) 60 MG capsule Take 1 capsule (60 mg total) by mouth daily. Patient not taking: Reported on 11/10/2016 09/14/16   Marcial Pacas, MD    Family History No family history on file.  Social History Social History   Tobacco Use  . Smoking status: Former Research scientist (life sciences)  . Smokeless tobacco: Never Used  Substance Use Topics  . Alcohol use: No    Alcohol/week: 0.0 oz  . Drug use: No     Allergies   Fluviral [flu virus vaccine]; Amlodipine; Lisinopril; and Ranexa [ranolazine]   Review of Systems Review of Systems  Constitutional: Negative for fever.  HENT: Positive for congestion.   Eyes: Negative for visual disturbance.  Respiratory: Positive for shortness of breath.   Cardiovascular: Positive for chest pain.  Gastrointestinal: Positive for nausea. Negative for abdominal pain and vomiting.  Genitourinary: Negative for dysuria.  Musculoskeletal: Negative for back pain.  Skin: Negative for rash.  Neurological: Negative for headaches.  Hematological: Does not bruise/bleed easily.  Psychiatric/Behavioral: Negative for confusion.     Physical Exam Updated Vital Signs BP (!) 155/98 (BP Location: Right Arm)   Pulse (!) 111   Temp 97.9 F (36.6 C)   Resp 15   Ht 1.6 m (5\' 3" )   Wt 78 kg (172 lb)   SpO2 97%   BMI 30.47 kg/m   Physical Exam  Constitutional: She is oriented to person, place, and time. She appears well-developed and well-nourished. No distress.  HENT:  Head: Normocephalic and atraumatic.  Mouth/Throat: Oropharynx is clear and moist.  Eyes: Conjunctivae and EOM are normal. Pupils are equal, round, and reactive to light.  Cardiovascular: Normal rate, regular rhythm and normal heart sounds.  Pulmonary/Chest:  Effort normal and breath sounds normal. She exhibits no tenderness.  Abdominal: Soft. Bowel sounds are normal. There is no tenderness.  Musculoskeletal: Normal range of motion. She exhibits no edema.  Neurological: She is alert and oriented to person, place, and time. No cranial nerve deficit or sensory deficit. She exhibits normal muscle tone. Coordination normal.  Skin: Skin is warm.  Nursing note and vitals reviewed.    ED Treatments / Results  Labs (all labs ordered are listed, but only abnormal results are displayed) Labs Reviewed  CBC - Abnormal; Notable for the following components:      Result Value   RBC 5.14 (*)    Hemoglobin 15.6 (*)    HCT 47.6 (*)    Platelets 536 (*)    All other components within normal limits  BASIC METABOLIC PANEL  TROPONIN I  TROPONIN I  EKG  EKG Interpretation  Date/Time:  Saturday February 12 2017 20:11:49 EST Ventricular Rate:  63 PR Interval:  172 QRS Duration: 74 QT Interval:  436 QTC Calculation: 446 R Axis:   -9 Text Interpretation:  Normal sinus rhythm Possible Left atrial enlargement Left ventricular hypertrophy Cannot rule out Septal infarct , age undetermined Abnormal ECG No significant change since last tracing Confirmed by Fredia Sorrow [phone]) on 02/12/2017 10:15:24 PM       Radiology Dg Chest 2 View  Result Date: 02/12/2017 CLINICAL DATA:  Three day history of chest pain EXAM: CHEST  2 VIEW COMPARISON:  08/21/2016 FINDINGS: The lungs are clear without focal pneumonia, edema, pneumothorax or pleural effusion. Cardiopericardial silhouette is at upper limits of normal for size. The visualized bony structures of the thorax are intact. Bones are diffusely demineralized. IMPRESSION: No active cardiopulmonary disease. Electronically Signed   By: Misty Stanley M.D.   On: 02/12/2017 21:39    Procedures Procedures (including critical care time)  Medications Ordered in ED Medications - No data to display   Initial Impression  / Assessment and Plan / ED Course  I have reviewed the triage vital signs and the nursing notes.  Pertinent labs & imaging results that were available during my care of the patient were reviewed by me and considered in my medical decision making (see chart for details).     Patient's substernal chest pain atypical for her was the reason why she came in.  Onset at 1900 tonight.  Has improved.  But not gone away.  Initial troponin was negative.  Repeat troponin had been ordered at midnight.  Is pending.  Chest x-ray without any acute findings.  Labs without any significant abnormalities.  Patient is showing signs of hypertension but blood pressure has improved here currently 155/98.  Patient is followed by cardiology in [place].  If second troponin is negative patient is probably fine for discharge home and follow-up with cardiology.  And and follow-up of her blood pressure.   Patient states that there is some increased pain with movement suggestive of chest wall pain.  Not really exertional chest pain.  No tenderness to palpation of the chest itself.   Final Clinical Impressions(s) / ED Diagnoses   Final diagnoses:  Precordial pain    ED Discharge Orders    None       Fredia Sorrow, MD 02/13/17 Tempie Hoist, MD 02/13/17 0111  Addendum: Patient's second heart marker which was approximately 5 and half hours from the onset of pain was negative.  Patient also has chest wall component.  So this most likely chest wall pain.  Patient will be treated with tramadol.  Patient also has some congestion.  She has hypertension so recommended Coricidin over-the-counter for that.  She will talk to the pharmacist.  Patient's blood pressure shows that her blood pressures are now systolic 220.  Diastolics are normal.  Heart rate currently on the monitor is in the 50s consistent with a sinus bradycardia.  Patient feeling overall much better.  Patient will receive 1 dose of tramadol here prior  to discharge.    Fredia Sorrow, MD 02/13/17 [phone]

## 2017-02-13 NOTE — Discharge Instructions (Signed)
Make an appointment to follow-up with your cardiologist and regular doctor.  Return for any new or worse symptoms.  Take the tramadol for the pain.

## 2017-02-13 NOTE — ED Notes (Signed)
Pt alert & oriented x4, stable gait. Patient given discharge instructions, paperwork & prescription(s). Patient  instructed to stop at the registration desk to finish any additional paperwork. Patient verbalized understanding. Pt left department w/ no further questions. 

## 2017-02-16 DIAGNOSIS — I252 Old myocardial infarction: Secondary | ICD-10-CM | POA: Diagnosis not present

## 2017-02-16 DIAGNOSIS — R05 Cough: Secondary | ICD-10-CM | POA: Diagnosis not present

## 2017-02-16 DIAGNOSIS — I700 Atherosclerosis of aorta: Secondary | ICD-10-CM | POA: Diagnosis not present

## 2017-02-16 DIAGNOSIS — Z79899 Other long term (current) drug therapy: Secondary | ICD-10-CM | POA: Diagnosis not present

## 2017-02-16 DIAGNOSIS — I10 Essential (primary) hypertension: Secondary | ICD-10-CM | POA: Diagnosis not present

## 2017-02-16 DIAGNOSIS — J020 Streptococcal pharyngitis: Secondary | ICD-10-CM | POA: Diagnosis not present

## 2017-02-16 DIAGNOSIS — Z87891 Personal history of nicotine dependence: Secondary | ICD-10-CM | POA: Diagnosis not present

## 2017-02-16 DIAGNOSIS — E039 Hypothyroidism, unspecified: Secondary | ICD-10-CM | POA: Diagnosis not present

## 2017-02-16 DIAGNOSIS — Z8249 Family history of ischemic heart disease and other diseases of the circulatory system: Secondary | ICD-10-CM | POA: Diagnosis not present

## 2017-02-18 ENCOUNTER — Telehealth: Payer: Self-pay | Admitting: Cardiovascular Disease

## 2017-02-18 ENCOUNTER — Encounter: Payer: Self-pay | Admitting: *Deleted

## 2017-02-18 NOTE — Telephone Encounter (Signed)
CXR requested from [facility].

## 2017-02-18 NOTE — Telephone Encounter (Signed)
Records received and placed in MD's basket for review.

## 2017-02-18 NOTE — Telephone Encounter (Signed)
Patient called stating that she went to [facility] ER  02/16/2017. States that she had a chest xray and was told that she needs to contact Her heart doctor about the xray.  Please call [phone].

## 2017-02-19 ENCOUNTER — Other Ambulatory Visit: Payer: Self-pay | Admitting: Cardiovascular Disease

## 2017-02-25 ENCOUNTER — Ambulatory Visit: Payer: Self-pay

## 2017-03-04 IMAGING — CT CT MAXILLOFACIAL W/O CM
3 series · 11 of 47 positions shown, 13 images · non-contrast
Comparison: 08/29/2014 head CT

CLINICAL DATA: Chronic sinusitis.  Anosmia for 2 weeks.

EXAM:
CT MAXILLOFACIAL WITHOUT CONTRAST
TECHNIQUE: Multidetector CT imaging of the maxillofacial structures was
performed. Multiplanar CT image reconstructions were also generated.
A small metallic BB was placed on the right temple in order to
reliably differentiate right from left.

[Series 2: standard · axial · 0.40mm/px · z∈[+1290,+1378]mm · 5 of 112 slices shown, 7 images]
[im 12/112  brain]
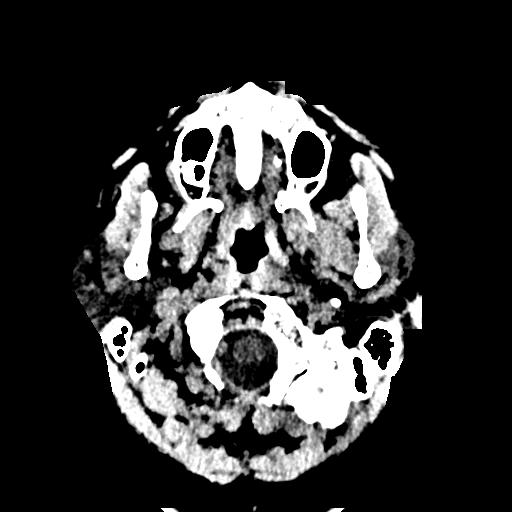
[im 12/112  bone]
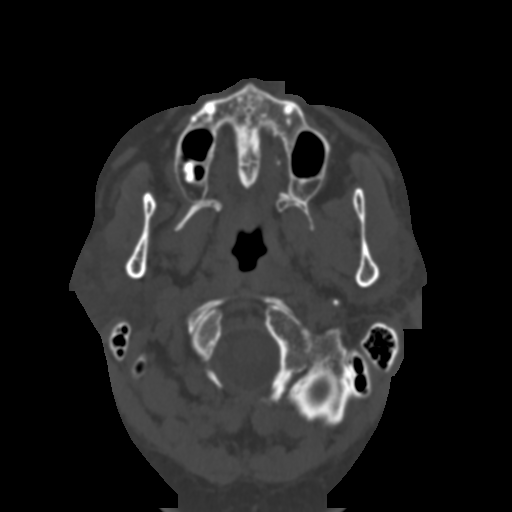
[im 35/112  bone]
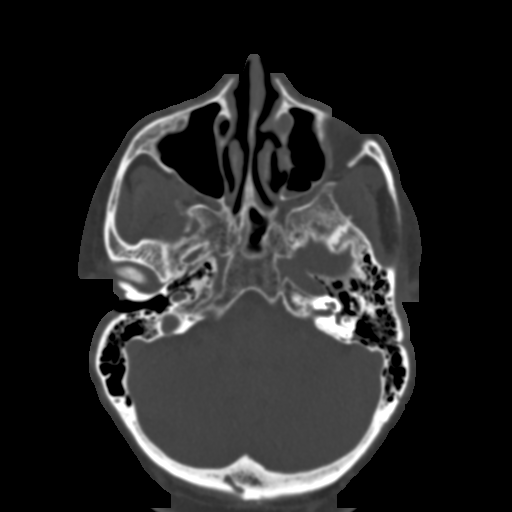
[im 58/112  bone]
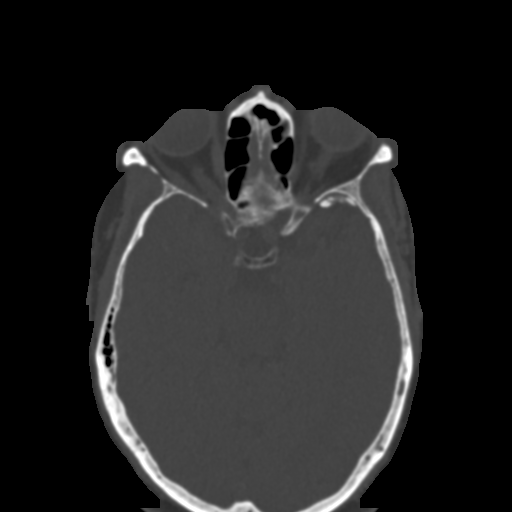
[im 77/112  bone]
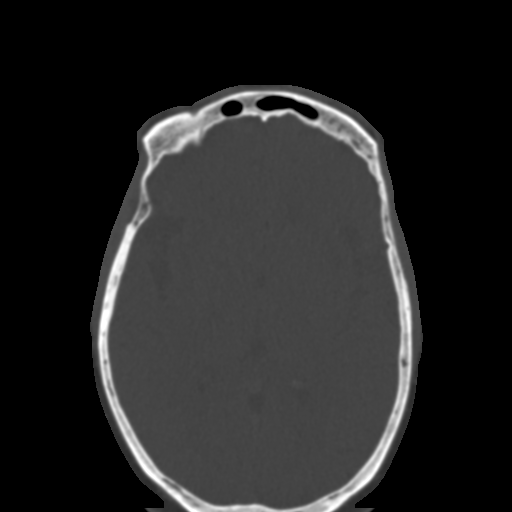
[im 100/112  brain]
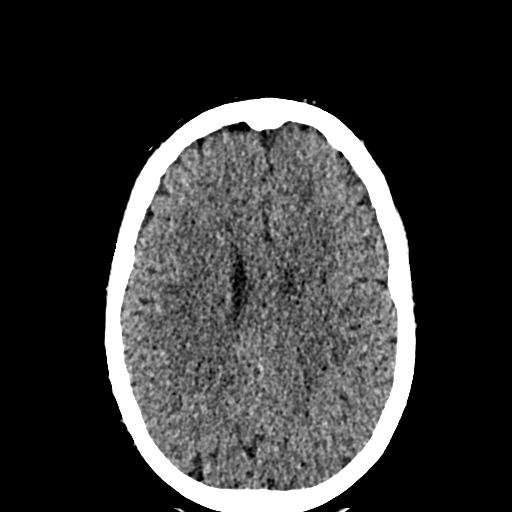
[im 100/112  bone]
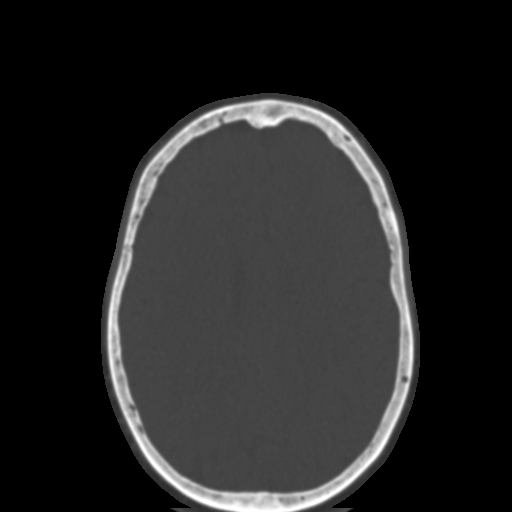

[Series 4: coronal · coronal · 0.22mm/px · 3 of 114 slices shown]
[im 38/114  bone]
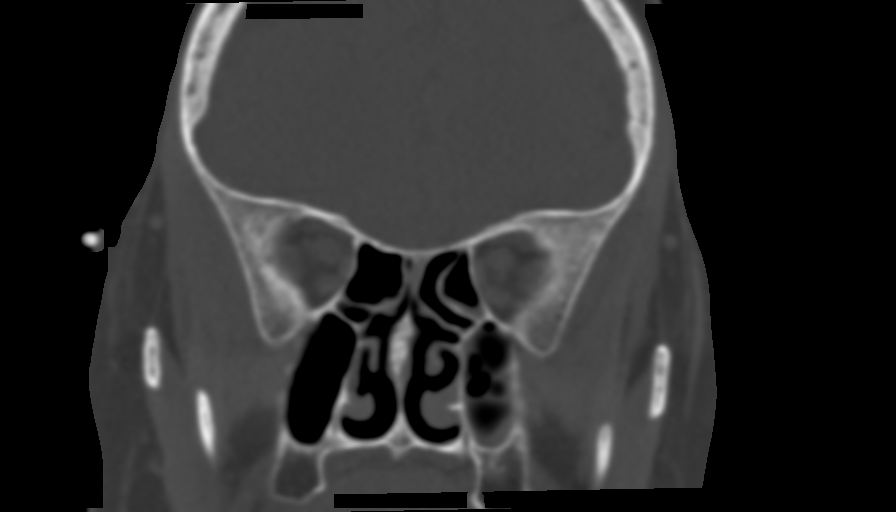
[im 51/114  bone]
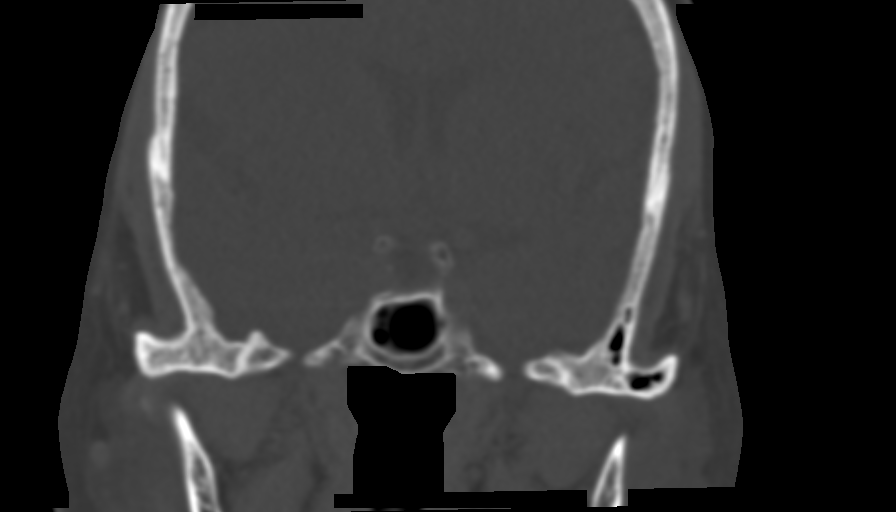
[im 63/114  bone]
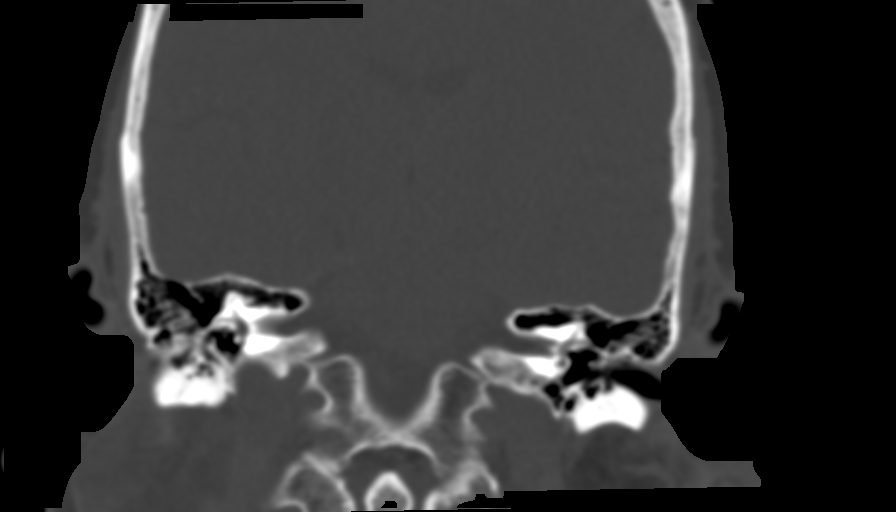

[Series 5: sagittal · sagittal · 0.22mm/px · 3 of 101 slices shown]
[im 34/101  bone]
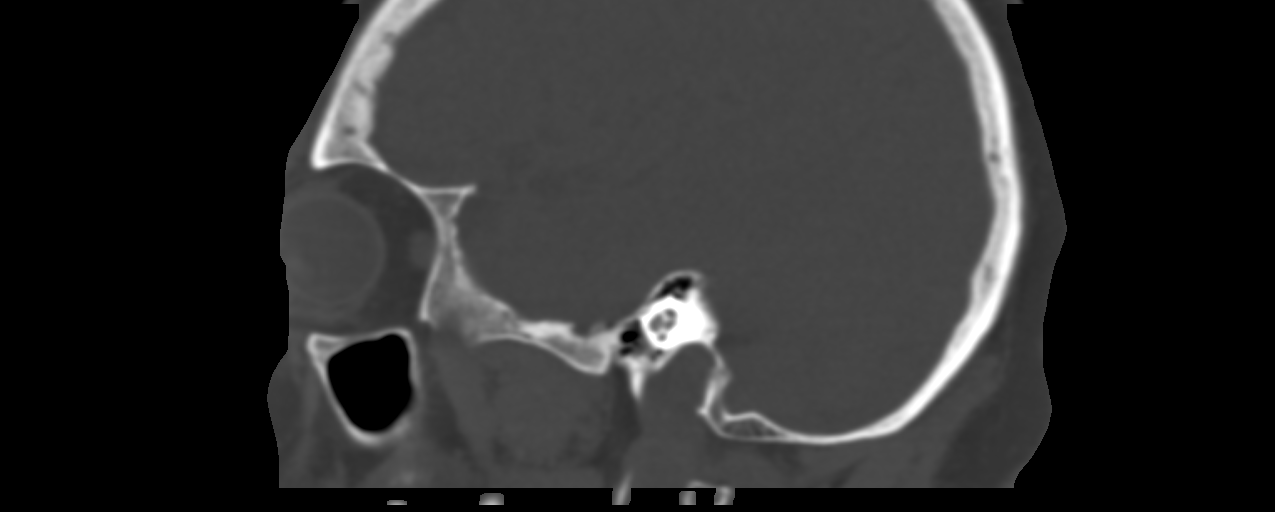
[im 51/101  bone]
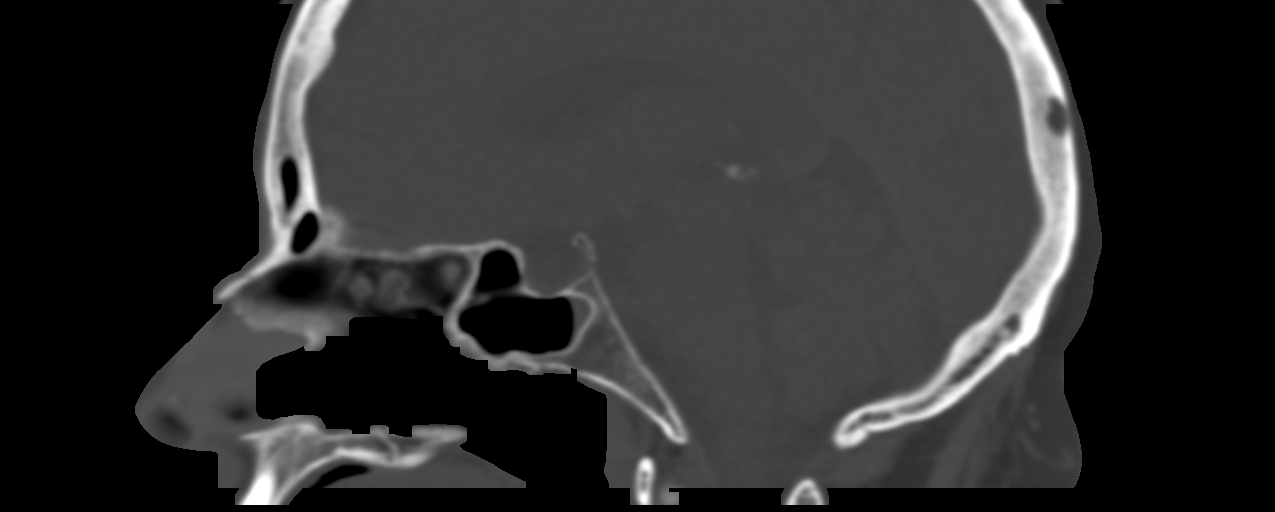
[im 67/101  bone]
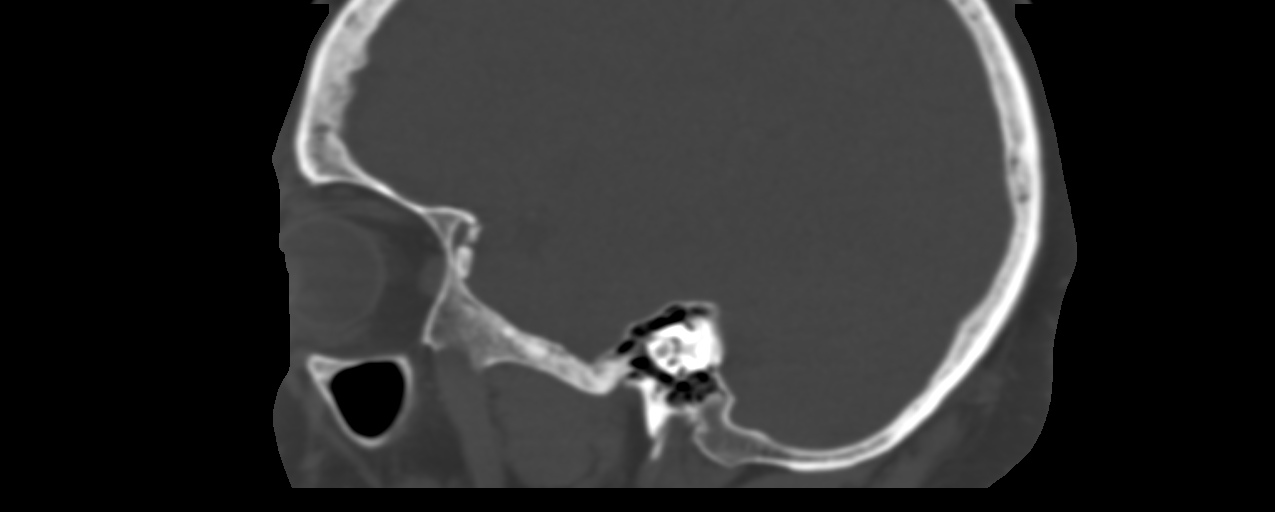

[11 of 47 positions shown; findings below may reference images not displayed]

FINDINGS: Osseous: No fracture or destructive process.

Orbits: Negative. No traumatic or inflammatory finding.

Sinuses: Sparing the frontal sinuses there is patchy mucosal edema
throughout bilateral paranasal sinuses. Degree of inflammation is
significantly improved since 3023.

Mucosal thickening effaces bilateral maxillary infundibula with
retained secretions in the left maxillary antrum.

Narrow left frontal ethmoidal recess with superimposed stenosis from
mucosal thickening.

Effaced left sphenoid ethmoidal recess with retained secretions in
the left sphenoid sinus.

Minimal rightward nasal septal deviation.

4 mm osteoma in a left anterior ethmoid air cell without
obstruction.

Soft tissues: No inflammatory changes

Limited intracranial: No olfactory recess mass. Periventricular
microvascular ischemic change.
IMPRESSION: Chronic sinusitis that is improved since August 2014 head CT.
Generalized sinus outflow obstruction with retained secretions in
the left maxillary and sphenoid sinuses.

## 2017-03-22 ENCOUNTER — Telehealth: Payer: Self-pay | Admitting: Cardiovascular Disease

## 2017-03-22 NOTE — Telephone Encounter (Signed)
[name] [name] called stating that she had a Chest Xray at [facility] in January 2019.  She was wanting to know if Dr. Bronson Ing has Been able to review the xray.  Please call [phone].

## 2017-03-22 NOTE — Telephone Encounter (Signed)
Mailbox full

## 2017-03-22 NOTE — Telephone Encounter (Signed)
This was scanned into chart in January - routed to provider

## 2017-03-22 NOTE — Telephone Encounter (Signed)
No CHF. Showed atherosclerosis which is stable and previously known.

## 2017-03-24 NOTE — Telephone Encounter (Signed)
LM to return call.

## 2017-03-24 NOTE — Telephone Encounter (Signed)
Patient returned call

## 2017-03-25 ENCOUNTER — Encounter: Payer: Self-pay | Admitting: *Deleted

## 2017-03-25 NOTE — Telephone Encounter (Signed)
Attempted to reach patient again - mailbox is full.  Letter mailed.

## 2017-03-31 ENCOUNTER — Emergency Department (HOSPITAL_COMMUNITY)
Admission: EM | Admit: 2017-03-31 | Discharge: 2017-03-31 | Disposition: A | Discharge status: Home / Self Care | Payer: Medicare HMO | Attending: Emergency Medicine | Admitting: Emergency Medicine

## 2017-03-31 ENCOUNTER — Encounter (HOSPITAL_COMMUNITY): Payer: Self-pay | Admitting: Emergency Medicine

## 2017-03-31 DIAGNOSIS — R05 Cough: Secondary | ICD-10-CM | POA: Diagnosis not present

## 2017-03-31 DIAGNOSIS — E039 Hypothyroidism, unspecified: Secondary | ICD-10-CM | POA: Diagnosis not present

## 2017-03-31 DIAGNOSIS — I252 Old myocardial infarction: Secondary | ICD-10-CM | POA: Diagnosis not present

## 2017-03-31 DIAGNOSIS — R51 Headache: Secondary | ICD-10-CM | POA: Diagnosis not present

## 2017-03-31 DIAGNOSIS — R0981 Nasal congestion: Secondary | ICD-10-CM | POA: Diagnosis not present

## 2017-03-31 DIAGNOSIS — I2510 Atherosclerotic heart disease of native coronary artery without angina pectoris: Secondary | ICD-10-CM | POA: Insufficient documentation

## 2017-03-31 DIAGNOSIS — Z79899 Other long term (current) drug therapy: Secondary | ICD-10-CM | POA: Insufficient documentation

## 2017-03-31 DIAGNOSIS — R519 Headache, unspecified: Secondary | ICD-10-CM

## 2017-03-31 DIAGNOSIS — I10 Essential (primary) hypertension: Secondary | ICD-10-CM | POA: Insufficient documentation

## 2017-03-31 DIAGNOSIS — Z87891 Personal history of nicotine dependence: Secondary | ICD-10-CM | POA: Insufficient documentation

## 2017-03-31 DIAGNOSIS — R0982 Postnasal drip: Secondary | ICD-10-CM | POA: Diagnosis not present

## 2017-03-31 MED ORDER — HYDROCODONE-HOMATROPINE 5-1.5 MG/5ML PO SYRP: ORAL_SOLUTION | ORAL | 0 refills | Status: DC

## 2017-03-31 MED ORDER — DOXYCYCLINE HYCLATE 100 MG PO CAPS: 100.0000 mg | ORAL_CAPSULE | Freq: Two times a day (BID) | ORAL | 0 refills | Status: DC

## 2017-03-31 MED ORDER — DEXAMETHASONE 4 MG PO TABS: 4.0000 mg | ORAL_TABLET | Freq: Two times a day (BID) | ORAL | 0 refills | Status: DC

## 2017-03-31 NOTE — ED Provider Notes (Signed)
[facility] EMERGENCY DEPARTMENT Provider Note   CSN: [id number] Arrival date & time: 03/31/17  0109     History   Chief Complaint Chief Complaint  Patient presents with  . Headache    HPI [name] [name] is a 72 y.o. female.  Patient is a 72 year old female who presents to the emergency department with left-sided headache.  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  She has been evaluated by ear nose and throat.  She has been advised that surgery may be a good option for her, however she was not able to be given a guarantee of improvement following surgery so she is holding out for right now.  She states that as a result from time to time she gets aggressive sinus infections.  She states that this headache and congestion feel similar to previous sinus headache and congestion.  She denies any loss of consciousness, no vision changes, no difficulty with balance, no difficulty with speech, no difficulty with walking, or with use of any of the extremities.  She has not had any injury or trauma to the head recently.  She called her GYN physician for an appointment, but they cannot see her until next week and I told her to come to the emergency department for evaluation and management until that time.      Past Medical History:  Diagnosis Date  . Anxiety   . Heart attack ([place])   . History of echocardiogram    a. 2D ECHO: 09/21/2013; EF 55-60%; mild concentric hypertrophy, G1DD, mild LA dilation, PA pressure 37. Small RV pericardial effusion  . Hypertension   . Hypothyroidism   . NSTEMI (non-ST elevated myocardial infarction) ([place])    a.  Related to occlusion of the distal Cx. The mid to distal obtuse marginal #1 also contains a significant stenosis that is best treated with medical therapy. Patent RCA/LAD with tortuosity. LM patent. EF 55%.   . Sleep apnea   . Thyroid disease     Patient Active Problem List   Diagnosis Date Noted  . Neck pain 09/14/2016  . Paresthesia 09/14/2016  . Chest pain 09/26/2013  . HTN (hypertension) 09/22/2013  . Hypothyroidism   . Sleep apnea   . Anxiety   . NSTEMI (non-ST elevated myocardial infarction) ([place]) 09/20/2013  . HLD (hyperlipidemia) 01/17/2008  . Essential hypertension 01/17/2008  . CORONARY ARTERY DISEASE 01/17/2008    Past Surgical History:  Procedure Laterality Date  . ABDOMINAL HYSTERECTOMY    . CARDIAC CATHETERIZATION  ~ 2000  . CORONARY ANGIOPLASTY  09/20/2013  . LEFT HEART CATHETERIZATION WITH CORONARY ANGIOGRAM N/A 09/20/2013   Procedure: LEFT HEART CATHETERIZATION WITH CORONARY ANGIOGRAM;  Surgeon: Sinclair Grooms, MD;  Location: [facility] CATH LAB;  Service: Cardiovascular;  Laterality: N/A;  . LEFT HEART CATHETERIZATION WITH CORONARY ANGIOGRAM N/A 09/27/2013   Procedure: LEFT HEART CATHETERIZATION WITH CORONARY ANGIOGRAM;  Surgeon: Blane Ohara, MD;  Location: [facility] CATH LAB;  Service: Cardiovascular;  Laterality: N/A;  . THYROIDECTOMY  10/08/2011   Procedure: THYROIDECTOMY;  Surgeon: [name], MD;  Location: [facility] ORS;  Service: General;  Laterality: N/A;  Total  . VAGINAL HYSTERECTOMY  1993    OB History    Gravida Para Term Preterm AB Living   2 2 2  0 0     SAB TAB Ectopic Multiple Live Births   0 0 0  Home Medications    Prior to Admission medications   Medication Sig Start Date End Date Taking? Authorizing Provider  Ascorbic Acid (VITAMIN C) 1000 MG tablet Take 1,000 mg by mouth daily.    [provider]  chlorthalidone (HYGROTON) 25 MG tablet TAKE ONE TABLET BY MOUTH EVERY DAY Patient not taking: Reported on 11/10/2016 09/22/16   Herminio Commons, MD  clonazePAM (KLONOPIN) 0.5 MG tablet Take 1 tablet (0.5 mg total) by mouth at bedtime as needed for anxiety. Patient not taking: Reported on 11/10/2016 09/14/16   Marcial Pacas, MD  DULoxetine (CYMBALTA) 60 MG capsule Take 1 capsule (60 mg total) by mouth  daily. Patient not taking: Reported on 11/10/2016 09/14/16   Marcial Pacas, MD  levothyroxine (SYNTHROID, LEVOTHROID) 125 MCG tablet Take 125 mcg by mouth daily. 02/25/16   [provider]  metoprolol succinate (TOPROL-XL) 100 MG 24 hr tablet Take 100 mg by mouth daily. 02/25/16   [provider]  Multiple Vitamins-Minerals (MULTIVITAMINS THER. W/MINERALS) TABS tablet Take 1 tablet by mouth daily.    [provider]  nitroGLYCERIN (NITROSTAT) 0.4 MG SL tablet DISSOLVE ONE TABLET UNDER TONGUE EVERY FIVE MINUTES AS NEEDED FOR CHEST PAIN 02/21/17   Herminio Commons, MD  traMADol (ULTRAM) 50 MG tablet Take 1 tablet (50 mg total) by mouth every 6 (six) hours as needed. 02/13/17   Fredia Sorrow, MD    Family History History reviewed. No pertinent family history.  Social History Social History   Tobacco Use  . Smoking status: Former Research scientist (life sciences)  . Smokeless tobacco: Never Used  Substance Use Topics  . Alcohol use: No    Alcohol/week: 0.0 oz  . Drug use: No     Allergies   Fluviral [flu virus vaccine]; Amlodipine; Lisinopril; and Ranexa [ranolazine]   Review of Systems Review of Systems  Constitutional: Negative for activity change.       All ROS Neg except as noted in HPI  HENT: Positive for congestion, postnasal drip, sinus pressure and sinus pain. Negative for nosebleeds.   Eyes: Negative for photophobia and discharge.  Respiratory: Positive for cough. Negative for shortness of breath and wheezing.   Cardiovascular: Negative for chest pain and palpitations.  Gastrointestinal: Negative for abdominal pain and blood in stool.  Genitourinary: Negative for dysuria, frequency and hematuria.  Musculoskeletal: Negative for arthralgias, back pain and neck pain.  Skin: Negative.   Neurological: Negative for dizziness, seizures and speech difficulty.  Psychiatric/Behavioral: Negative for confusion and hallucinations.     Physical Exam Updated Vital Signs BP (!)  160/83 (BP Location: Right Arm)   Pulse 78   Temp 98.4 F (36.9 C) (Oral)   Resp 16   Ht 5\' 3"  (1.6 m)   Wt 80.7 kg (178 lb)   SpO2 96%   BMI 31.53 kg/m   Physical Exam  Constitutional: She appears well-developed and well-nourished. No distress.  HENT:  Head: Normocephalic and atraumatic.  Right Ear: External ear normal.  Left Ear: External ear normal.  There is pain to percussion over the frontal and maxillary sinuses on the left.  Eyes: Conjunctivae are normal. Right eye exhibits no discharge. Left eye exhibits no discharge. No scleral icterus.  Neck: Neck supple. No tracheal deviation present.  Cardiovascular: Normal rate, regular rhythm and intact distal pulses.  Pulmonary/Chest: Effort normal and breath sounds normal. No stridor. No respiratory distress. She has no wheezes. She has no rales.  Abdominal: Soft. Bowel sounds are normal. She exhibits no distension. There is  no tenderness. There is no rebound and no guarding.  Musculoskeletal: She exhibits no edema or tenderness.  Neurological: She is alert. She has normal strength. No cranial nerve deficit (no facial droop, extraocular movements intact, no slurred speech) or sensory deficit. She exhibits normal muscle tone. She displays no seizure activity. Coordination normal.  Gait is steady without problem.  Skin: Skin is warm and dry. No rash noted.  Psychiatric: She has a normal mood and affect.  Nursing note and vitals reviewed.    ED Treatments / Results  Labs (all labs ordered are listed, but only abnormal results are displayed) Labs Reviewed - No data to display  EKG  EKG Interpretation None       Radiology No results found.  Procedures Procedures (including critical care time)  Medications Ordered in ED Medications - No data to display   Initial Impression / Assessment and Plan / ED Course  I have reviewed the triage vital signs and the nursing notes.  Pertinent labs & imaging results that were  available during my care of the patient were reviewed by me and considered in my medical decision making (see chart for details).     Case discussed with Dr. Lita Mains.  Final Clinical Impressions(s) / ED Diagnoses MDM  Blood pressure is elevated at 163/97, otherwise vital signs within normal limits.  The examination favors a sinus headache.  Patient states this headache is similar to previous sinus headaches that she has had in the past.  No gross neurologic deficits appreciated whatsoever.  Gait is steady, balance is within normal limits.  Patient will be treated with antibiotics, steroids, cough medication and decongestant of her choice.  The patient will see ear nose and throat next week.  The patient will return to the emergency department sooner if any changes in condition, problems, or concerns.   Final diagnoses:  Sinus headache    ED Discharge Orders        Ordered    doxycycline (VIBRAMYCIN) 100 MG capsule  2 times daily     03/31/17 1046    dexamethasone (DECADRON) 4 MG tablet  2 times daily with meals     03/31/17 1046    HYDROcodone-homatropine (HYCODAN) 5-1.5 MG/5ML syrup     03/31/17 1046       Lily Kocher, PA-C 03/31/17 1113    Julianne Rice, MD 03/31/17 1247

## 2017-03-31 NOTE — Discharge Instructions (Signed)
Your blood pressure is slightly elevated at 160/83, otherwise your vital signs within normal limits.  Your oxygen level is 96% on room air.  Your examination favors a sinus headache.  Please use the decongestant of your choice.  Please increase water, Gatorade, juices, etc.  Use doxycycline and Decadron 2 times daily with food.  Use Hycodan at bedtime or every 6 hours as needed for cough and congestion.  Please see Dr.Teoh as scheduled.

## 2017-03-31 NOTE — ED Triage Notes (Signed)
Patient complaining of left sided head and facial pain and congestion x 3 days. States "I have sinus problems and it usually feels like this and I called my ENT doctor but he can't see me until the 28th so he told me to just come to the ER."

## 2017-04-07 ENCOUNTER — Ambulatory Visit (INDEPENDENT_AMBULATORY_CARE_PROVIDER_SITE_OTHER): Payer: Self-pay | Admitting: Otolaryngology

## 2017-04-11 DIAGNOSIS — I10 Essential (primary) hypertension: Secondary | ICD-10-CM | POA: Diagnosis not present

## 2017-04-11 DIAGNOSIS — Z0001 Encounter for general adult medical examination with abnormal findings: Secondary | ICD-10-CM | POA: Diagnosis not present

## 2017-04-11 DIAGNOSIS — I2510 Atherosclerotic heart disease of native coronary artery without angina pectoris: Secondary | ICD-10-CM | POA: Diagnosis not present

## 2017-04-11 DIAGNOSIS — Z1389 Encounter for screening for other disorder: Secondary | ICD-10-CM | POA: Diagnosis not present

## 2017-04-11 DIAGNOSIS — E039 Hypothyroidism, unspecified: Secondary | ICD-10-CM | POA: Diagnosis not present

## 2017-04-11 DIAGNOSIS — Z1331 Encounter for screening for depression: Secondary | ICD-10-CM | POA: Diagnosis not present

## 2017-04-21 DIAGNOSIS — I2510 Atherosclerotic heart disease of native coronary artery without angina pectoris: Secondary | ICD-10-CM | POA: Diagnosis not present

## 2017-04-21 DIAGNOSIS — J309 Allergic rhinitis, unspecified: Secondary | ICD-10-CM | POA: Diagnosis not present

## 2017-04-21 DIAGNOSIS — Z6832 Body mass index (BMI) 32.0-32.9, adult: Secondary | ICD-10-CM | POA: Diagnosis not present

## 2017-04-21 DIAGNOSIS — G4700 Insomnia, unspecified: Secondary | ICD-10-CM | POA: Diagnosis not present

## 2017-04-21 DIAGNOSIS — R739 Hyperglycemia, unspecified: Secondary | ICD-10-CM | POA: Diagnosis not present

## 2017-04-21 DIAGNOSIS — I214 Non-ST elevation (NSTEMI) myocardial infarction: Secondary | ICD-10-CM | POA: Diagnosis not present

## 2017-04-21 DIAGNOSIS — I10 Essential (primary) hypertension: Secondary | ICD-10-CM | POA: Diagnosis not present

## 2017-04-21 DIAGNOSIS — E039 Hypothyroidism, unspecified: Secondary | ICD-10-CM | POA: Diagnosis not present

## 2017-05-09 ENCOUNTER — Telehealth: Payer: Self-pay | Admitting: Cardiovascular Disease

## 2017-05-09 DIAGNOSIS — K439 Ventral hernia without obstruction or gangrene: Secondary | ICD-10-CM | POA: Diagnosis not present

## 2017-05-09 NOTE — Telephone Encounter (Signed)
LMTCB

## 2017-05-09 NOTE — Telephone Encounter (Signed)
pateint walk in  Patient has been talking sinus medication that has raised her BP Wanted to ask about pain in center of chest said not sharp pain but enough for her to feel it. No SOB or other symptoms

## 2017-05-09 NOTE — Telephone Encounter (Signed)
Agree with your recs.

## 2017-05-09 NOTE — Telephone Encounter (Signed)
Patient states she was seen in the ER on 3/21 for sinus congestion and given dexamethasone 4 mg and doxycyline 100 mg. Patient states she finished them both yesterday. Patient did state she did not take the medication as prescribed and missed a few days which is why she was on it longer than prescribed. Patient reports BP has been running around 180/100 while on these medications. Advised patient that steroids can increase your BP. Patient verbalized understating. Patient reports no chest pain but has had acid reflux. Patient denies any shortness of breath or any other symptoms. Patient states she has felt much better. Advised patient to check BP every few days and if she does not see BP coming down to contact our office. Will send to provider for further recommendations. Patient verbalized understanding.

## 2017-07-06 ENCOUNTER — Emergency Department (HOSPITAL_COMMUNITY)
Admission: EM | Admit: 2017-07-06 | Discharge: 2017-07-06 | Disposition: A | Discharge status: Home / Self Care | Payer: Medicare HMO | Attending: Emergency Medicine | Admitting: Emergency Medicine

## 2017-07-06 ENCOUNTER — Encounter (HOSPITAL_COMMUNITY): Payer: Self-pay

## 2017-07-06 DIAGNOSIS — Z79899 Other long term (current) drug therapy: Secondary | ICD-10-CM | POA: Insufficient documentation

## 2017-07-06 DIAGNOSIS — R350 Frequency of micturition: Secondary | ICD-10-CM | POA: Diagnosis present

## 2017-07-06 DIAGNOSIS — I10 Essential (primary) hypertension: Secondary | ICD-10-CM | POA: Insufficient documentation

## 2017-07-06 DIAGNOSIS — J019 Acute sinusitis, unspecified: Secondary | ICD-10-CM

## 2017-07-06 DIAGNOSIS — I252 Old myocardial infarction: Secondary | ICD-10-CM | POA: Insufficient documentation

## 2017-07-06 DIAGNOSIS — I2510 Atherosclerotic heart disease of native coronary artery without angina pectoris: Secondary | ICD-10-CM | POA: Diagnosis not present

## 2017-07-06 DIAGNOSIS — E039 Hypothyroidism, unspecified: Secondary | ICD-10-CM | POA: Insufficient documentation

## 2017-07-06 DIAGNOSIS — N390 Urinary tract infection, site not specified: Secondary | ICD-10-CM

## 2017-07-06 LAB — URINALYSIS, ROUTINE W REFLEX MICROSCOPIC
Bilirubin Urine: NEGATIVE
Glucose, UA: NEGATIVE mg/dL
Hgb urine dipstick: NEGATIVE
Ketones, ur: NEGATIVE mg/dL
Nitrite: NEGATIVE
Protein, ur: NEGATIVE mg/dL
Specific Gravity, Urine: 1.011 (ref 1.005–1.030)
pH: 7 (ref 5.0–8.0)

## 2017-07-06 MED ORDER — FLUTICASONE PROPIONATE 50 MCG/ACT NA SUSP: 2.0000 | Freq: Every day | NASAL | 0 refills | Status: DC

## 2017-07-06 MED ORDER — TRAMADOL HCL 50 MG PO TABS: 50.0000 mg | ORAL_TABLET | Freq: Four times a day (QID) | ORAL | 0 refills | Status: DC | PRN

## 2017-07-06 MED ORDER — CIPROFLOXACIN HCL 500 MG PO TABS: 500.0000 mg | ORAL_TABLET | Freq: Two times a day (BID) | ORAL | 0 refills | Status: DC

## 2017-07-06 NOTE — ED Notes (Signed)
Dr. Wilson Singer made aware of patient's BP. Prescription for pain medicine given.

## 2017-07-06 NOTE — ED Triage Notes (Signed)
Pt  Reports that she has a HA from sinus pressure and nasal congestion for 2 days. Pt reports productive cough. Puffy around eyes . Also reports pain left flank area with urinary frequency. Denies burning

## 2017-07-08 LAB — URINE CULTURE: Culture: >=100000 — AB

## 2017-07-09 ENCOUNTER — Telehealth: Payer: Self-pay

## 2017-07-09 NOTE — Telephone Encounter (Signed)
Post ED Visit - Positive Culture Follow-up  Culture report reviewed by antimicrobial stewardship pharmacist:  []  Elenor Quinones, Pharm.D. []  Heide Guile, Pharm.D., BCPS AQ-ID [x]  Parks Neptune, Pharm.D., BCPS []  Alycia Rossetti, Pharm.D., BCPS []  Bier, Pharm.D., BCPS, AAHIVP []  Legrand Como, Pharm.D., BCPS, AAHIVP []  Salome Arnt, PharmD, BCPS []  Wynell Balloon, PharmD []  Vincenza Hews, PharmD, BCPS  Positive urine culture Treated with Ciprofloxacin, organism sensitive to the same and no further patient follow-up is required at this time.  Genia Del 07/09/2017, 10:30 AM

## 2017-07-09 NOTE — ED Provider Notes (Signed)
[facility] EMERGENCY DEPARTMENT Provider Note   CSN: [id number] Arrival date & time: 07/06/17  1250     History   Chief Complaint Chief Complaint  Patient presents with  . Headache  . Urinary Frequency    HPI IRYS NIGH is a 72 y.o. female.  HPI   72 year old female with facial pressure and congestion for past 2 days.  Occasional nonproductive cough.  She feels like her face is puffy around her eyes.  Additionally, she complains of pain with the left flank and increased urinary frequency.  No dysuria or hematuria.  No abdominal pain.  Past Medical History:  Diagnosis Date  . Anxiety   . Heart attack ([place])   . History of echocardiogram    a. 2D ECHO: 09/21/2013; EF 55-60%; mild concentric hypertrophy, G1DD, mild LA dilation, PA pressure 37. Small RV pericardial effusion  . Hypertension   . Hypothyroidism   . NSTEMI (non-ST elevated myocardial infarction) ([place])    a.  Related to occlusion of the distal Cx. The mid to distal obtuse marginal #1 also contains a significant stenosis that is best treated with medical therapy. Patent RCA/LAD with tortuosity. LM patent. EF 55%.   . Sleep apnea   . Thyroid disease     Patient Active Problem List   Diagnosis Date Noted  . Neck pain 09/14/2016  . Paresthesia 09/14/2016  . Chest pain 09/26/2013  . HTN (hypertension) 09/22/2013  . Hypothyroidism   . Sleep apnea   . Anxiety   . NSTEMI (non-ST elevated myocardial infarction) ([place]) 09/20/2013  . HLD (hyperlipidemia) 01/17/2008  . Essential hypertension 01/17/2008  . CORONARY ARTERY DISEASE 01/17/2008    Past Surgical History:  Procedure Laterality Date  . ABDOMINAL HYSTERECTOMY    . CARDIAC CATHETERIZATION  ~ 2000  . CORONARY ANGIOPLASTY  09/20/2013  . LEFT HEART CATHETERIZATION WITH CORONARY ANGIOGRAM N/A 09/20/2013   Procedure: LEFT HEART CATHETERIZATION WITH CORONARY ANGIOGRAM;  Surgeon: Sinclair Grooms, MD;  Location: [facility] CATH LAB;  Service: Cardiovascular;   Laterality: N/A;  . LEFT HEART CATHETERIZATION WITH CORONARY ANGIOGRAM N/A 09/27/2013   Procedure: LEFT HEART CATHETERIZATION WITH CORONARY ANGIOGRAM;  Surgeon: Blane Ohara, MD;  Location: [facility] CATH LAB;  Service: Cardiovascular;  Laterality: N/A;  . THYROIDECTOMY  10/08/2011   Procedure: THYROIDECTOMY;  Surgeon: [name], MD;  Location: [facility] ORS;  Service: General;  Laterality: N/A;  Total  . VAGINAL HYSTERECTOMY  1993     OB History    Gravida  2   Para  2   Term  2   Preterm  0   AB  0   Living        SAB  0   TAB  0   Ectopic  0   Multiple      Live Births               Home Medications    Prior to Admission medications   Medication Sig Start Date End Date Taking? Authorizing Provider  Ascorbic Acid (VITAMIN C) 1000 MG tablet Take 1,000 mg by mouth daily.    [provider]  chlorthalidone (HYGROTON) 25 MG tablet TAKE ONE TABLET BY MOUTH EVERY DAY Patient not taking: Reported on 11/10/2016 09/22/16   Herminio Commons, MD  ciprofloxacin (CIPRO) 500 MG tablet Take 1 tablet (500 mg total) by mouth every 12 (twelve) hours. 07/06/17   Virgel Manifold, MD  clonazePAM (KLONOPIN) 0.5 MG tablet Take 1 tablet (0.5 mg  total) by mouth at bedtime as needed for anxiety. Patient not taking: Reported on 11/10/2016 09/14/16   Marcial Pacas, MD  dexamethasone (DECADRON) 4 MG tablet Take 1 tablet (4 mg total) by mouth 2 (two) times daily with a meal. 03/31/17   Lily Kocher, PA-C  doxycycline (VIBRAMYCIN) 100 MG capsule Take 1 capsule (100 mg total) by mouth 2 (two) times daily. 03/31/17   Lily Kocher, PA-C  DULoxetine (CYMBALTA) 60 MG capsule Take 1 capsule (60 mg total) by mouth daily. Patient not taking: Reported on 11/10/2016 09/14/16   Marcial Pacas, MD  fluticasone [facility]) 50 MCG/ACT nasal spray Place 2 sprays into both nostrils daily. 07/06/17   Virgel Manifold, MD  HYDROcodone-homatropine [facility]) 5-1.5 MG/5ML syrup 39ml at bedtime, or q6h prn cough and congestion.  03/31/17   Lily Kocher, PA-C  levothyroxine (SYNTHROID, LEVOTHROID) 125 MCG tablet Take 125 mcg by mouth daily. 02/25/16   [provider]  metoprolol succinate (TOPROL-XL) 100 MG 24 hr tablet Take 100 mg by mouth daily. 02/25/16   [provider]  Multiple Vitamins-Minerals (MULTIVITAMINS THER. W/MINERALS) TABS tablet Take 1 tablet by mouth daily.    [provider]  nitroGLYCERIN (NITROSTAT) 0.4 MG SL tablet DISSOLVE ONE TABLET UNDER TONGUE EVERY FIVE MINUTES AS NEEDED FOR CHEST PAIN 02/21/17   Herminio Commons, MD  traMADol (ULTRAM) 50 MG tablet Take 1 tablet (50 mg total) by mouth every 6 (six) hours as needed. 07/06/17   Virgel Manifold, MD    Family History No family history on file.  Social History Social History   Tobacco Use  . Smoking status: Never Smoker  . Smokeless tobacco: Never Used  Substance Use Topics  . Alcohol use: No    Alcohol/week: 0.0 oz  . Drug use: No     Allergies   Fluviral [flu virus vaccine]; Amlodipine; Lisinopril; and Ranexa [ranolazine]   Review of Systems Review of Systems  All systems reviewed and negative, other than as noted in HPI.  Physical Exam Updated Vital Signs BP (!) 209/100 (BP Location: Right Arm)   Pulse (!) 58   Temp 97.8 F (36.6 C) (Oral)   Resp 16   Wt 77.1 kg (170 lb)   SpO2 94%   BMI 30.11 kg/m   Physical Exam  Constitutional: She appears well-developed and well-nourished. No distress.  HENT:  Head: Normocephalic and atraumatic.  Eyes: Pupils are equal, round, and reactive to light. Conjunctivae and EOM are normal. Right eye exhibits no discharge. Left eye exhibits no discharge.  Boggy nasal mucosa.  Mild tenderness palpation over the maxillary sinuses.  Neck: Neck supple.  Cardiovascular: Normal rate, regular rhythm and normal heart sounds. Exam reveals no gallop and no friction rub.  No murmur heard. Pulmonary/Chest: Effort normal and breath sounds normal. No respiratory distress.    Abdominal: Soft. She exhibits no distension. There is no tenderness.  No CVA tenderness  Musculoskeletal: She exhibits no edema or tenderness.  Neurological: She is alert.  Skin: Skin is warm and dry.  Psychiatric: She has a normal mood and affect. Her behavior is normal. Thought content normal.  Nursing note and vitals reviewed.    ED Treatments / Results  Labs (all labs ordered are listed, but only abnormal results are displayed) Labs Reviewed  URINE CULTURE - Abnormal; Notable for the following components:      Result Value   Culture   (*)    Value: >=100,000 COLONIES/mL GROUP B STREP(S.AGALACTIAE)ISOLATED TESTING AGAINST S. AGALACTIAE NOT ROUTINELY PERFORMED  DUE TO PREDICTABILITY OF AMP/PEN/VAN SUSCEPTIBILITY. Performed at [facility], [place] [address], [place], [place] [address]    All other components within normal limits  URINALYSIS, ROUTINE W REFLEX MICROSCOPIC - Abnormal; Notable for the following components:   APPearance HAZY (*)    Leukocytes, UA MODERATE (*)    Bacteria, UA RARE (*)    All other components within normal limits    EKG None  Radiology No results found.  Procedures Procedures (including critical care time)  Medications Ordered in ED Medications - No data to display   Initial Impression / Assessment and Plan / ED Course  I have reviewed the triage vital signs and the nursing notes.  Pertinent labs & imaging results that were available during my care of the patient were reviewed by me and considered in my medical decision making (see chart for details).     72 year old female with symptoms of sinusitis.  Plan decongestants.  UA is consistent with UTI.  Will place Cipro.  She is appropriate for outpatient treatment.  Final Clinical Impressions(s) / ED Diagnoses   Final diagnoses:  Urinary tract infection without hematuria, site unspecified  Acute non-recurrent sinusitis, unspecified location    ED Discharge Orders        Ordered     ciprofloxacin (CIPRO) 500 MG tablet  Every 12 hours     07/06/17 1727    fluticasone (FLONASE) 50 MCG/ACT nasal spray  Daily     07/06/17 1727    traMADol (ULTRAM) 50 MG tablet  Every 6 hours PRN     07/06/17 1752       Virgel Manifold, MD 07/09/17 2106

## 2017-07-14 ENCOUNTER — Encounter: Payer: Self-pay | Admitting: Cardiovascular Disease

## 2017-07-14 ENCOUNTER — Ambulatory Visit: Payer: Medicare HMO | Admitting: Cardiovascular Disease

## 2017-07-20 DIAGNOSIS — J309 Allergic rhinitis, unspecified: Secondary | ICD-10-CM | POA: Diagnosis not present

## 2017-07-20 DIAGNOSIS — N390 Urinary tract infection, site not specified: Secondary | ICD-10-CM | POA: Diagnosis not present

## 2017-07-20 DIAGNOSIS — I10 Essential (primary) hypertension: Secondary | ICD-10-CM | POA: Diagnosis not present

## 2017-07-20 DIAGNOSIS — Z9119 Patient's noncompliance with other medical treatment and regimen: Secondary | ICD-10-CM | POA: Diagnosis not present

## 2017-09-16 DIAGNOSIS — E039 Hypothyroidism, unspecified: Secondary | ICD-10-CM | POA: Diagnosis not present

## 2017-09-16 DIAGNOSIS — Z79899 Other long term (current) drug therapy: Secondary | ICD-10-CM | POA: Diagnosis not present

## 2017-09-16 DIAGNOSIS — I10 Essential (primary) hypertension: Secondary | ICD-10-CM | POA: Diagnosis not present

## 2017-09-16 DIAGNOSIS — Z87891 Personal history of nicotine dependence: Secondary | ICD-10-CM | POA: Diagnosis not present

## 2017-09-16 DIAGNOSIS — M79672 Pain in left foot: Secondary | ICD-10-CM | POA: Diagnosis not present

## 2017-09-16 DIAGNOSIS — Z951 Presence of aortocoronary bypass graft: Secondary | ICD-10-CM | POA: Diagnosis not present

## 2017-10-05 ENCOUNTER — Telehealth: Payer: Self-pay | Admitting: Neurology

## 2017-10-05 NOTE — Telephone Encounter (Signed)
Mailed records and billing statement to [employer]

## 2017-10-20 ENCOUNTER — Emergency Department (HOSPITAL_COMMUNITY)
Admission: EM | Admit: 2017-10-20 | Discharge: 2017-10-20 | Disposition: A | Discharge status: Home / Self Care | Payer: Medicare HMO | Attending: Emergency Medicine | Admitting: Emergency Medicine

## 2017-10-20 ENCOUNTER — Other Ambulatory Visit: Payer: Self-pay

## 2017-10-20 ENCOUNTER — Encounter (HOSPITAL_COMMUNITY): Payer: Self-pay | Admitting: Emergency Medicine

## 2017-10-20 ENCOUNTER — Telehealth: Payer: Self-pay | Admitting: *Deleted

## 2017-10-20 DIAGNOSIS — Z9114 Patient's other noncompliance with medication regimen: Secondary | ICD-10-CM | POA: Diagnosis not present

## 2017-10-20 DIAGNOSIS — E039 Hypothyroidism, unspecified: Secondary | ICD-10-CM | POA: Diagnosis not present

## 2017-10-20 DIAGNOSIS — I2510 Atherosclerotic heart disease of native coronary artery without angina pectoris: Secondary | ICD-10-CM | POA: Insufficient documentation

## 2017-10-20 DIAGNOSIS — I252 Old myocardial infarction: Secondary | ICD-10-CM | POA: Diagnosis not present

## 2017-10-20 DIAGNOSIS — I10 Essential (primary) hypertension: Secondary | ICD-10-CM | POA: Diagnosis not present

## 2017-10-20 DIAGNOSIS — Z79899 Other long term (current) drug therapy: Secondary | ICD-10-CM | POA: Diagnosis not present

## 2017-10-20 DIAGNOSIS — J0100 Acute maxillary sinusitis, unspecified: Secondary | ICD-10-CM | POA: Insufficient documentation

## 2017-10-20 MED ORDER — HYDRALAZINE HCL 10 MG PO TABS
10.0000 mg | ORAL_TABLET | Freq: Once | ORAL | Status: AC
  Administered 2017-10-20: 10 mg via ORAL
  Filled 2017-10-20: qty 1

## 2017-10-20 MED ORDER — AMOXICILLIN-POT CLAVULANATE 875-125 MG PO TABS: 1.0000 | ORAL_TABLET | Freq: Two times a day (BID) | ORAL | 0 refills | Status: DC

## 2017-10-20 MED ORDER — HYDRALAZINE HCL 10 MG PO TABS: 10.0000 mg | ORAL_TABLET | Freq: Three times a day (TID) | ORAL | 0 refills | Status: DC

## 2017-10-20 MED ORDER — ACETAMINOPHEN 325 MG PO TABS
650.0000 mg | ORAL_TABLET | Freq: Four times a day (QID) | ORAL | Status: DC | PRN
  Administered 2017-10-20: 650 mg via ORAL
  Filled 2017-10-20: qty 2

## 2017-10-20 NOTE — Discharge Instructions (Addendum)
See your Physician for recheck.  °

## 2017-10-20 NOTE — Telephone Encounter (Signed)
Dr Nicky Pugh office requested sooner appt for pt for HTN (220s/110s today at appt) - pt already scheduled with Dr Bronson Ing for 10/28 and no sooner appt in either office available - Dr Legrand Rams had already sent pt to ED for further evaluation - nurse said pt would just keep appt as scheduled in October and if needed would call us back after ED workup

## 2017-10-20 NOTE — ED Triage Notes (Signed)
PT states she went to Dr. Legrand Rams (PCP) this am for c/o sinus congestion and they sent her to the ED today d/t HTN in the office. PT denies any OTC cold medications.

## 2017-10-20 NOTE — ED Provider Notes (Signed)
[facility] Provider Note   CSN: [id number] Arrival date & time: 10/20/17  1044     History   Chief Complaint Chief Complaint  Patient presents with  . Hypertension    HPI [name] [name] is a 72 y.o. female.  The history is provided by the patient. No language interpreter was used.  Headache   This is a new problem. The problem occurs constantly. The headache is associated with nothing. The quality of the pain is described as sharp. She has tried nothing for the symptoms. The treatment provided moderate relief.  Pt reports she thinks she has a sinus infection.  Pt went to Dr. Josephine Cables office and her blood pressure was high.   Pt reports she is taking her medications.   Pt reports she is not having any symptoms.    Past Medical History:  Diagnosis Date  . Anxiety   . Heart attack ([place])   . History of echocardiogram    a. 2D ECHO: 09/21/2013; EF 55-60%; mild concentric hypertrophy, G1DD, mild LA dilation, PA pressure 37. Small RV pericardial effusion  . Hypertension   . Hypothyroidism   . NSTEMI (non-ST elevated myocardial infarction) ([place])    a.  Related to occlusion of the distal Cx. The mid to distal obtuse marginal #1 also contains a significant stenosis that is best treated with medical therapy. Patent RCA/LAD with tortuosity. LM patent. EF 55%.   . Sleep apnea   . Thyroid disease     Patient Active Problem List   Diagnosis Date Noted  . Neck pain 09/14/2016  . Paresthesia 09/14/2016  . Chest pain 09/26/2013  . HTN (hypertension) 09/22/2013  . Hypothyroidism   . Sleep apnea   . Anxiety   . NSTEMI (non-ST elevated myocardial infarction) ([place]) 09/20/2013  . HLD (hyperlipidemia) 01/17/2008  . Essential hypertension 01/17/2008  . CORONARY ARTERY DISEASE 01/17/2008    Past Surgical History:  Procedure Laterality Date  . ABDOMINAL HYSTERECTOMY    . CARDIAC CATHETERIZATION  ~ 2000  . CORONARY ANGIOPLASTY  09/20/2013  . LEFT HEART  CATHETERIZATION WITH CORONARY ANGIOGRAM N/A 09/20/2013   Procedure: LEFT HEART CATHETERIZATION WITH CORONARY ANGIOGRAM;  Surgeon: Sinclair Grooms, MD;  Location: [facility] CATH LAB;  Service: Cardiovascular;  Laterality: N/A;  . LEFT HEART CATHETERIZATION WITH CORONARY ANGIOGRAM N/A 09/27/2013   Procedure: LEFT HEART CATHETERIZATION WITH CORONARY ANGIOGRAM;  Surgeon: Blane Ohara, MD;  Location: [facility] CATH LAB;  Service: Cardiovascular;  Laterality: N/A;  . THYROIDECTOMY  10/08/2011   Procedure: THYROIDECTOMY;  Surgeon: [name], MD;  Location: [facility] ORS;  Service: General;  Laterality: N/A;  Total  . VAGINAL HYSTERECTOMY  1993     OB History    Gravida  2   Para  2   Term  2   Preterm  0   AB  0   Living  2     SAB  0   TAB  0   Ectopic  0   Multiple      Live Births               Home Medications    Prior to Admission medications   Medication Sig Start Date End Date Taking? Authorizing Provider  Ascorbic Acid (VITAMIN C) 1000 MG tablet Take 1,000 mg by mouth daily.    [provider]  chlorthalidone (HYGROTON) 25 MG tablet TAKE ONE TABLET BY MOUTH EVERY DAY Patient not taking: Reported on 11/10/2016 09/22/16  Herminio Commons, MD  ciprofloxacin (CIPRO) 500 MG tablet Take 1 tablet (500 mg total) by mouth every 12 (twelve) hours. 07/06/17   Virgel Manifold, MD  clonazePAM (KLONOPIN) 0.5 MG tablet Take 1 tablet (0.5 mg total) by mouth at bedtime as needed for anxiety. Patient not taking: Reported on 11/10/2016 09/14/16   Marcial Pacas, MD  dexamethasone (DECADRON) 4 MG tablet Take 1 tablet (4 mg total) by mouth 2 (two) times daily with a meal. 03/31/17   Lily Kocher, PA-C  doxycycline (VIBRAMYCIN) 100 MG capsule Take 1 capsule (100 mg total) by mouth 2 (two) times daily. 03/31/17   Lily Kocher, PA-C  DULoxetine (CYMBALTA) 60 MG capsule Take 1 capsule (60 mg total) by mouth daily. Patient not taking: Reported on 11/10/2016 09/14/16   Marcial Pacas, MD  fluticasone  [facility]) 50 MCG/ACT nasal spray Place 2 sprays into both nostrils daily. 07/06/17   Virgel Manifold, MD  HYDROcodone-homatropine [facility]) 5-1.5 MG/5ML syrup 72ml at bedtime, or q6h prn cough and congestion. 03/31/17   Lily Kocher, PA-C  levothyroxine (SYNTHROID, LEVOTHROID) 125 MCG tablet Take 125 mcg by mouth daily. 02/25/16   [provider]  metoprolol succinate (TOPROL-XL) 100 MG 24 hr tablet Take 100 mg by mouth daily. 02/25/16   [provider]  Multiple Vitamins-Minerals (MULTIVITAMINS THER. W/MINERALS) TABS tablet Take 1 tablet by mouth daily.    [provider]  nitroGLYCERIN (NITROSTAT) 0.4 MG SL tablet DISSOLVE ONE TABLET UNDER TONGUE EVERY FIVE MINUTES AS NEEDED FOR CHEST PAIN 02/21/17   Herminio Commons, MD  traMADol (ULTRAM) 50 MG tablet Take 1 tablet (50 mg total) by mouth every 6 (six) hours as needed. 07/06/17   Virgel Manifold, MD    Family History History reviewed. No pertinent family history.  Social History Social History   Tobacco Use  . Smoking status: Never Smoker  . Smokeless tobacco: Never Used  Substance Use Topics  . Alcohol use: No    Alcohol/week: 0.0 standard drinks  . Drug use: No     Allergies   Fluviral [flu virus vaccine]; Amlodipine; Lisinopril; and Ranexa [ranolazine]   Review of Systems Review of Systems  Neurological: Positive for headaches.  All other systems reviewed and are negative.    Physical Exam Updated Vital Signs BP (!) 199/107 (BP Location: Right Arm)   Pulse 60   Temp 98.3 F (36.8 C) (Oral)   Resp 17   Ht 5\' 3"  (1.6 m)   Wt 77.1 kg   SpO2 97%   BMI 30.11 kg/m   Physical Exam  Constitutional: She is oriented to person, place, and time. She appears well-developed and well-nourished.  HENT:  Head: Normocephalic.  Right Ear: External ear normal.  Left Ear: External ear normal.  Nose: Nose normal.  Mouth/Throat: Oropharynx is clear and moist.  Eyes: Pupils are equal, round, and reactive  to light. Conjunctivae and EOM are normal.  Neck: Normal range of motion.  Cardiovascular: Normal rate.  Pulmonary/Chest: Effort normal.  Abdominal: Soft.  Musculoskeletal: Normal range of motion.  Neurological: She is alert and oriented to person, place, and time.  Skin: Skin is warm.  Psychiatric: She has a normal mood and affect.  Nursing note and vitals reviewed.    ED Treatments / Results  Labs (all labs ordered are listed, but only abnormal results are displayed) Labs Reviewed - No data to display  EKG EKG Interpretation  Date/Time:  Thursday October 20 2017 12:39:59 EDT Ventricular Rate:  65 PR Interval:  184 QRS Duration: 74 QT Interval:  448 QTC Calculation: 465 R Axis:   64 Text Interpretation:  Normal sinus rhythm Septal infarct , age undetermined Abnormal ECG No significant change since last tracing Confirmed by Virgel Manifold [phone]) on 10/20/2017 1:32:53 PM   Radiology No results found.  Procedures Procedures (including critical care time)  Medications Ordered in ED Medications  acetaminophen (TYLENOL) tablet 650 mg (650 mg Oral Given 10/20/17 1240)     Initial Impression / Assessment and Plan / ED Course  I have reviewed the triage vital signs and the nursing notes.  Pertinent labs & imaging results that were available during my care of the patient were reviewed by me and considered in my medical decision making (see chart for details).     MDM  Ekg no acute abnormality .  Pt has elevated blood pressure on recheck. Pt given po hydrazine.  Pt given rx for Augmentin for sinusitis.   Pt advised to see Dr. Legrand Rams for recheck of her blood pressure   Final Clinical Impressions(s) / ED Diagnoses   Final diagnoses:  Hypertension, unspecified type    ED Discharge Orders         Ordered    hydrALAZINE (APRESOLINE) 10 MG tablet  3 times daily     10/20/17 1337    amoxicillin-clavulanate (AUGMENTIN) 875-125 MG tablet  Every 12 hours     10/20/17 1337          An After Visit Summary was printed and given to the patient.   Sidney Ace 10/21/17 1123    Virgel Manifold, MD 10/21/17 1341

## 2017-11-06 DIAGNOSIS — Z87891 Personal history of nicotine dependence: Secondary | ICD-10-CM | POA: Diagnosis not present

## 2017-11-06 DIAGNOSIS — I252 Old myocardial infarction: Secondary | ICD-10-CM | POA: Diagnosis not present

## 2017-11-06 DIAGNOSIS — R51 Headache: Secondary | ICD-10-CM | POA: Diagnosis not present

## 2017-11-06 DIAGNOSIS — E039 Hypothyroidism, unspecified: Secondary | ICD-10-CM | POA: Diagnosis not present

## 2017-11-06 DIAGNOSIS — Z79890 Hormone replacement therapy (postmenopausal): Secondary | ICD-10-CM | POA: Diagnosis not present

## 2017-11-06 DIAGNOSIS — J019 Acute sinusitis, unspecified: Secondary | ICD-10-CM | POA: Diagnosis not present

## 2017-11-06 DIAGNOSIS — R05 Cough: Secondary | ICD-10-CM | POA: Diagnosis not present

## 2017-12-02 ENCOUNTER — Ambulatory Visit: Payer: Self-pay | Admitting: Cardiovascular Disease

## 2017-12-05 ENCOUNTER — Ambulatory Visit: Payer: Self-pay | Admitting: Cardiovascular Disease

## 2018-01-03 DIAGNOSIS — Z79899 Other long term (current) drug therapy: Secondary | ICD-10-CM | POA: Diagnosis not present

## 2018-01-03 DIAGNOSIS — I2510 Atherosclerotic heart disease of native coronary artery without angina pectoris: Secondary | ICD-10-CM | POA: Diagnosis not present

## 2018-01-03 DIAGNOSIS — E039 Hypothyroidism, unspecified: Secondary | ICD-10-CM | POA: Diagnosis not present

## 2018-01-03 DIAGNOSIS — R05 Cough: Secondary | ICD-10-CM | POA: Diagnosis not present

## 2018-01-03 DIAGNOSIS — R51 Headache: Secondary | ICD-10-CM | POA: Diagnosis not present

## 2018-01-03 DIAGNOSIS — I10 Essential (primary) hypertension: Secondary | ICD-10-CM | POA: Diagnosis not present

## 2018-01-03 DIAGNOSIS — J209 Acute bronchitis, unspecified: Secondary | ICD-10-CM | POA: Diagnosis not present

## 2018-02-21 ENCOUNTER — Ambulatory Visit (INDEPENDENT_AMBULATORY_CARE_PROVIDER_SITE_OTHER): Payer: Medicare HMO | Admitting: Cardiovascular Disease

## 2018-02-21 ENCOUNTER — Encounter: Payer: Self-pay | Admitting: Cardiovascular Disease

## 2018-02-21 ENCOUNTER — Encounter

## 2018-02-21 VITALS — BP 160/88 | HR 68 | Ht 63.0 in | Wt 189.0 lb

## 2018-02-21 DIAGNOSIS — M79604 Pain in right leg: Secondary | ICD-10-CM | POA: Diagnosis not present

## 2018-02-21 DIAGNOSIS — I2510 Atherosclerotic heart disease of native coronary artery without angina pectoris: Secondary | ICD-10-CM

## 2018-02-21 DIAGNOSIS — M79605 Pain in left leg: Secondary | ICD-10-CM | POA: Diagnosis not present

## 2018-02-21 DIAGNOSIS — I10 Essential (primary) hypertension: Secondary | ICD-10-CM | POA: Diagnosis not present

## 2018-02-21 DIAGNOSIS — E785 Hyperlipidemia, unspecified: Secondary | ICD-10-CM | POA: Diagnosis not present

## 2018-02-21 NOTE — Progress Notes (Signed)
SUBJECTIVE: The patient presents for overdue follow-up.  I last saw her in June 2018.  She has a history of coronary artery disease.  In summary, she was admitted 09/20/13 to 09/22/13 with chest pain and sustained a NSTEMI. Cath 09/20/13 showed distally occluded OM1 60-80%. It was treated medically at that time and she was discharged home. She was readmitted with chest pain a few days later, and repeat cath on 09/27/13 showed subtotal occlusion of a medium caliber highly tortuous OM worse from prior study, described as unfavorable for PCI, though if refractory angina PCI could be considered per notes.  09/21/13 Echo LVEF 55-60%.  PMH also includes HTN, hyperlipidemia, pre-diabetes mellitus, and OSA .  She has previously been unable to tolerate Ranexa, Imdur, amlodipine, and lisinopril.  Low risk nuclear stress test (09/24/15).  She denies chest pain, palpitations, and shortness of breath.  She stopped taking aspirin because it led to a burning sensation in her stomach as well as diarrhea.  She stopped taking Crestor as well.  She is taking Toprol-XL.  She does have some bilateral leg cramping and some pain in the left leg.  She wonders about peripheral arterial disease.  Her niece was recently murdered and she is under a lot of emotional stress.  She has a strong faith.   Soc Hx: Her adopted daughter studied sociology and business at [employer] and now teaches history at [employer] in [place].  She also has a 70 year old adopted son.  Review of Systems: As per "subjective", otherwise negative.  Allergies  Allergen Reactions  . Fluviral [Flu Virus Vaccine] Shortness Of Breath, Swelling and Other (See Comments)    Including back pain that radiates in upper extremeties  . Amlodipine     Lip swelling per patient.   . Lisinopril     Dr. Legrand Rams stopped due to side effects per patient.  Could remember what side effects were.    [name] [Ranolazine]     Chest pain    Current  Outpatient Medications  Medication Sig Dispense Refill  . clonazePAM (KLONOPIN) 0.5 MG tablet Take 1 tablet (0.5 mg total) by mouth at bedtime as needed for anxiety. 30 tablet 0  . metoprolol succinate (TOPROL-XL) 100 MG 24 hr tablet Take 100 mg by mouth daily.    . Multiple Vitamins-Minerals (MULTIVITAMINS THER. W/MINERALS) TABS tablet Take 1 tablet by mouth daily.    . nitroGLYCERIN (NITROSTAT) 0.4 MG SL tablet DISSOLVE ONE TABLET UNDER TONGUE EVERY FIVE MINUTES AS NEEDED FOR CHEST PAIN 25 tablet 3   No current facility-administered medications for this visit.     Past Medical History:  Diagnosis Date  . Anxiety   . Heart attack ([place])   . History of echocardiogram    a. 2D ECHO: 09/21/2013; EF 55-60%; mild concentric hypertrophy, G1DD, mild LA dilation, PA pressure 37. Small RV pericardial effusion  . Hypertension   . Hypothyroidism   . NSTEMI (non-ST elevated myocardial infarction) ([place])    a.  Related to occlusion of the distal Cx. The mid to distal obtuse marginal #1 also contains a significant stenosis that is best treated with medical therapy. Patent RCA/LAD with tortuosity. LM patent. EF 55%.   . Sleep apnea   . Thyroid disease     Past Surgical History:  Procedure Laterality Date  . ABDOMINAL HYSTERECTOMY    . CARDIAC CATHETERIZATION  ~ 2000  . CORONARY ANGIOPLASTY  09/20/2013  . LEFT HEART CATHETERIZATION WITH CORONARY ANGIOGRAM N/A  09/20/2013   Procedure: LEFT HEART CATHETERIZATION WITH CORONARY ANGIOGRAM;  Surgeon: Sinclair Grooms, MD;  Location: [facility] CATH LAB;  Service: Cardiovascular;  Laterality: N/A;  . LEFT HEART CATHETERIZATION WITH CORONARY ANGIOGRAM N/A 09/27/2013   Procedure: LEFT HEART CATHETERIZATION WITH CORONARY ANGIOGRAM;  Surgeon: Blane Ohara, MD;  Location: [facility] CATH LAB;  Service: Cardiovascular;  Laterality: N/A;  . THYROIDECTOMY  10/08/2011   Procedure: THYROIDECTOMY;  Surgeon: [name], MD;  Location: [facility] ORS;  Service: General;  Laterality: N/A;   Total  . VAGINAL HYSTERECTOMY  1993    Social History   Socioeconomic History  . Marital status: Widowed    Spouse name: Not on file  . Number of children: Not on file  . Years of education: Not on file  . Highest education level: Not on file  Occupational History  . Not on file  Social Needs  . Financial resource strain: Not on file  . Food insecurity:    Worry: Not on file    Inability: Not on file  . Transportation needs:    Medical: Not on file    Non-medical: Not on file  Tobacco Use  . Smoking status: Never Smoker  . Smokeless tobacco: Never Used  Substance and Sexual Activity  . Alcohol use: No    Alcohol/week: 0.0 standard drinks  . Drug use: No  . Sexual activity: Never  Lifestyle  . Physical activity:    Days per week: Not on file    Minutes per session: Not on file  . Stress: Not on file  Relationships  . Social connections:    Talks on phone: Not on file    Gets together: Not on file    Attends religious service: Not on file    Active member of club or organization: Not on file    Attends meetings of clubs or organizations: Not on file    Relationship status: Not on file  . Intimate partner violence:    Fear of current or ex partner: Not on file    Emotionally abused: Not on file    Physically abused: Not on file    Forced sexual activity: Not on file  Other Topics Concern  . Not on file  Social History Narrative   ** Merged History Encounter **  Lives at home with family.  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        Vitals:   02/21/18 1038  BP: (!) 178/88  Pulse: 68  SpO2: 94%  Weight: 189 lb (85.7 kg)  Height: 5\' 3"  (1.6 m)    Wt Readings from Last 3 Encounters:  02/21/18 189 lb (85.7 kg)  10/20/17 170 lb (77.1 kg)  07/06/17 170 lb (77.1 kg)     PHYSICAL EXAM General: NAD HEENT: Normal. Neck: No JVD, no thyromegaly. Lungs: Clear to auscultation bilaterally with normal respiratory effort. CV: Regular rate and rhythm, normal S1/S2, no  S3/S4, no murmur. No pretibial or periankle edema.  No carotid bruit.   Abdomen: Soft, nontender, no distention.  Neurologic: Alert and oriented.  Psych: Normal affect. Skin: Normal. Musculoskeletal: No gross deformities.    ECG: Reviewed above under Subjective   Labs: Lab Results  Component Value Date/Time   K 3.5 02/12/2017 08:49 PM   BUN 14 02/12/2017 08:49 PM   CREATININE 0.87 02/12/2017 08:49 PM   ALT 21 02/28/2016 07:22 PM   TSH 7.330 (H) 09/14/2016 10:54 AM   HGB 15.6 (H) 02/12/2017 08:49 PM  Lipids: Lab Results  Component Value Date/Time   LDLCALC 57 09/27/2013 01:35 AM   CHOL 110 09/27/2013 01:35 AM   TRIG 54 09/27/2013 01:35 AM   HDL 42 09/27/2013 01:35 AM       ASSESSMENT AND PLAN: 1. CAD: Stable ischemic heart disease.  Was not able to tolerate aspirin or Crestor.  Currently taking Toprol-XL.  2. Essential HTN:  Blood pressure is markedly elevated.  She is no longer on chlorthalidone.  Both amlodipine and lisinopril caused lip swelling in the past.  She is under a lot of emotional stress.  This will need further monitoring.  3. Hyperlipidemia:  No longer on rosuvastatin. I will obtain a copy of lipids from PCP.  4.  Bilateral leg pain: I will obtain ABIs.   Disposition: Follow up 6 months   Kate Sable, M.D., F.A.C.C.

## 2018-02-21 NOTE — Patient Instructions (Signed)
Medication Instructions:  Your physician recommends that you continue on your current medications as directed. Please refer to the Current Medication list given to you today.  If you need a refill on your cardiac medications before your next appointment, please call your pharmacy.   Lab work: None today If you have labs (blood work) drawn today and your tests are completely normal, you will receive your results only by: [name] MyChart Message (if you have MyChart) OR . A paper copy in the mail If you have any lab test that is abnormal or we need to change your treatment, we will call you to review the results.  Testing/Procedures: Your physician has requested that you have an ankle brachial index (ABI). During this test an ultrasound and blood pressure cuff are used to evaluate the arteries that supply the legs with blood. Allow thirty minutes for this exam. There are no restrictions or special instructions.    Follow-Up: At [facility], you and your health needs are our priority.  As part of our continuing mission to provide you with exceptional heart care, we have created designated Provider Care Teams.  These Care Teams include your primary Cardiologist (physician) and Advanced Practice Providers (APPs -  Physician Assistants and Nurse Practitioners) who all work together to provide you with the care you need, when you need it. You will need a follow up appointment in 6 months.  Please call our office 2 months in advance to schedule this appointment.  You may see Kate Sable, MD or one of the following Advanced Practice Providers on your designated Care Team:   Bernerd Pho, PA-C [facility]) . Ermalinda Barrios, PA-C ([place])  Any Other Special Instructions Will Be Listed Below (If Applicable). None

## 2018-02-22 DIAGNOSIS — H524 Presbyopia: Secondary | ICD-10-CM | POA: Diagnosis not present

## 2018-02-23 ENCOUNTER — Telehealth: Payer: Self-pay | Admitting: *Deleted

## 2018-02-23 ENCOUNTER — Ambulatory Visit (HOSPITAL_COMMUNITY)
Admission: RE | Admit: 2018-02-23 | Discharge: 2018-02-23 | Disposition: A | Discharge status: Home / Self Care | Payer: Medicare HMO | Source: Ambulatory Visit | Attending: Cardiovascular Disease | Admitting: Cardiovascular Disease

## 2018-02-23 DIAGNOSIS — M79605 Pain in left leg: Secondary | ICD-10-CM | POA: Diagnosis not present

## 2018-02-23 DIAGNOSIS — M79662 Pain in left lower leg: Secondary | ICD-10-CM | POA: Diagnosis not present

## 2018-02-23 DIAGNOSIS — M79604 Pain in right leg: Secondary | ICD-10-CM | POA: Insufficient documentation

## 2018-02-23 NOTE — Telephone Encounter (Signed)
Called patient with test results. No answer. Left message to call back.  

## 2018-02-23 NOTE — Telephone Encounter (Signed)
-----   Message from Herminio Commons, MD sent at 02/23/2018 12:00 PM EST ----- No evidence of blockages.

## 2018-02-24 NOTE — Telephone Encounter (Signed)
Pt notified of test results

## 2018-02-24 NOTE — Telephone Encounter (Signed)
Returned call for results 

## 2018-03-20 DIAGNOSIS — E663 Overweight: Secondary | ICD-10-CM | POA: Diagnosis not present

## 2018-03-20 DIAGNOSIS — I252 Old myocardial infarction: Secondary | ICD-10-CM | POA: Diagnosis not present

## 2018-03-20 DIAGNOSIS — J018 Other acute sinusitis: Secondary | ICD-10-CM | POA: Diagnosis not present

## 2018-03-20 DIAGNOSIS — Z79899 Other long term (current) drug therapy: Secondary | ICD-10-CM | POA: Diagnosis not present

## 2018-03-20 DIAGNOSIS — I10 Essential (primary) hypertension: Secondary | ICD-10-CM | POA: Diagnosis not present

## 2018-03-20 DIAGNOSIS — J309 Allergic rhinitis, unspecified: Secondary | ICD-10-CM | POA: Diagnosis not present

## 2018-03-20 DIAGNOSIS — R918 Other nonspecific abnormal finding of lung field: Secondary | ICD-10-CM | POA: Diagnosis not present

## 2018-03-20 DIAGNOSIS — E039 Hypothyroidism, unspecified: Secondary | ICD-10-CM | POA: Diagnosis not present

## 2018-03-20 DIAGNOSIS — B9689 Other specified bacterial agents as the cause of diseases classified elsewhere: Secondary | ICD-10-CM | POA: Diagnosis not present

## 2018-03-20 DIAGNOSIS — Z8249 Family history of ischemic heart disease and other diseases of the circulatory system: Secondary | ICD-10-CM | POA: Diagnosis not present

## 2018-05-10 DIAGNOSIS — H2513 Age-related nuclear cataract, bilateral: Secondary | ICD-10-CM | POA: Diagnosis not present

## 2018-05-10 DIAGNOSIS — H35033 Hypertensive retinopathy, bilateral: Secondary | ICD-10-CM | POA: Diagnosis not present

## 2018-05-10 DIAGNOSIS — H25013 Cortical age-related cataract, bilateral: Secondary | ICD-10-CM | POA: Diagnosis not present

## 2018-06-20 DIAGNOSIS — H25013 Cortical age-related cataract, bilateral: Secondary | ICD-10-CM | POA: Diagnosis not present

## 2018-06-20 DIAGNOSIS — H2513 Age-related nuclear cataract, bilateral: Secondary | ICD-10-CM | POA: Diagnosis not present

## 2018-06-20 DIAGNOSIS — H25812 Combined forms of age-related cataract, left eye: Secondary | ICD-10-CM | POA: Diagnosis not present

## 2018-06-21 DIAGNOSIS — H2512 Age-related nuclear cataract, left eye: Secondary | ICD-10-CM | POA: Diagnosis not present

## 2018-06-27 DIAGNOSIS — Z0001 Encounter for general adult medical examination with abnormal findings: Secondary | ICD-10-CM | POA: Diagnosis not present

## 2018-06-27 DIAGNOSIS — E039 Hypothyroidism, unspecified: Secondary | ICD-10-CM | POA: Diagnosis not present

## 2018-06-27 DIAGNOSIS — Z1331 Encounter for screening for depression: Secondary | ICD-10-CM | POA: Diagnosis not present

## 2018-06-27 DIAGNOSIS — I2510 Atherosclerotic heart disease of native coronary artery without angina pectoris: Secondary | ICD-10-CM | POA: Diagnosis not present

## 2018-06-27 DIAGNOSIS — Z1389 Encounter for screening for other disorder: Secondary | ICD-10-CM | POA: Diagnosis not present

## 2018-06-27 DIAGNOSIS — I10 Essential (primary) hypertension: Secondary | ICD-10-CM | POA: Diagnosis not present

## 2018-06-28 ENCOUNTER — Other Ambulatory Visit (HOSPITAL_COMMUNITY)
Admission: RE | Admit: 2018-06-28 | Discharge: 2018-06-28 | Disposition: A | Discharge status: Home / Self Care | Payer: Medicare HMO | Source: Ambulatory Visit | Attending: Internal Medicine | Admitting: Internal Medicine

## 2018-06-28 DIAGNOSIS — I10 Essential (primary) hypertension: Secondary | ICD-10-CM | POA: Insufficient documentation

## 2018-06-28 DIAGNOSIS — M539 Dorsopathy, unspecified: Secondary | ICD-10-CM | POA: Insufficient documentation

## 2018-06-28 DIAGNOSIS — E309 Disorder of puberty, unspecified: Secondary | ICD-10-CM | POA: Insufficient documentation

## 2018-06-28 DIAGNOSIS — Z0001 Encounter for general adult medical examination with abnormal findings: Secondary | ICD-10-CM | POA: Insufficient documentation

## 2018-06-28 LAB — BASIC METABOLIC PANEL
Anion gap: 10 (ref 5–15)
BUN: 19 mg/dL (ref 8–23)
CO2: 22 mmol/L (ref 22–32)
Calcium: 9.1 mg/dL (ref 8.9–10.3)
Chloride: 109 mmol/L (ref 98–111)
Creatinine, Ser: 0.99 mg/dL (ref 0.44–1.00)
GFR calc Af Amer: >60 mL/min (ref >60)
GFR calc non Af Amer: 57 mL/min — ABNORMAL LOW (ref >60)
Glucose, Bld: 127 mg/dL — ABNORMAL HIGH (ref 70–99)
Potassium: 3.4 mmol/L — ABNORMAL LOW (ref 3.5–5.1)
Sodium: 141 mmol/L (ref 135–145)

## 2018-06-28 LAB — CBC WITH DIFFERENTIAL/PLATELET
Abs Immature Granulocytes: 0.03 10*3/uL (ref 0.00–0.07)
Basophils Absolute: 0 10*3/uL (ref 0.0–0.1)
Basophils Relative: 0 %
Eosinophils Absolute: 0.2 10*3/uL (ref 0.0–0.5)
Eosinophils Relative: 3 %
HCT: 50.1 % — ABNORMAL HIGH (ref 36.0–46.0)
Hemoglobin: 17 g/dL — ABNORMAL HIGH (ref 12.0–15.0)
Immature Granulocytes: 0 %
Lymphocytes Relative: 22 %
Lymphs Abs: 1.6 10*3/uL (ref 0.7–4.0)
MCH: 30.9 pg (ref 26.0–34.0)
MCHC: 33.9 g/dL (ref 30.0–36.0)
MCV: 90.9 fL (ref 80.0–100.0)
Monocytes Absolute: 0.3 10*3/uL (ref 0.1–1.0)
Monocytes Relative: 5 %
Neutro Abs: 5.1 10*3/uL (ref 1.7–7.7)
Neutrophils Relative %: 70 %
Platelets: 613 10*3/uL — ABNORMAL HIGH (ref 150–400)
RBC: 5.51 MIL/uL — ABNORMAL HIGH (ref 3.87–5.11)
RDW: 17 % — ABNORMAL HIGH (ref 11.5–15.5)
WBC: 7.3 10*3/uL (ref 4.0–10.5)
nRBC: 0 % (ref 0.0–0.2)

## 2018-06-28 LAB — LIPID PANEL
Cholesterol: 177 mg/dL (ref 0–200)
HDL: 51 mg/dL (ref >40)
LDL Cholesterol: 107 mg/dL — ABNORMAL HIGH (ref 0–99)
Total CHOL/HDL Ratio: 3.5 RATIO
Triglycerides: 96 mg/dL (ref <150)
VLDL: 19 mg/dL (ref 0–40)

## 2018-06-28 LAB — TSH: TSH: 4.703 u[IU]/mL — ABNORMAL HIGH (ref 0.350–4.500)

## 2018-06-29 ENCOUNTER — Other Ambulatory Visit (HOSPITAL_COMMUNITY)
Admission: RE | Admit: 2018-06-29 | Discharge: 2018-06-29 | Disposition: A | Discharge status: Home / Self Care | Payer: Medicare HMO | Source: Ambulatory Visit | Attending: Internal Medicine | Admitting: Internal Medicine

## 2018-06-29 DIAGNOSIS — Z0001 Encounter for general adult medical examination with abnormal findings: Secondary | ICD-10-CM | POA: Insufficient documentation

## 2018-06-29 DIAGNOSIS — I10 Essential (primary) hypertension: Secondary | ICD-10-CM | POA: Insufficient documentation

## 2018-06-29 LAB — URINALYSIS, ROUTINE W REFLEX MICROSCOPIC
Bilirubin Urine: NEGATIVE
Glucose, UA: NEGATIVE mg/dL
Hgb urine dipstick: NEGATIVE
Ketones, ur: NEGATIVE mg/dL
Leukocytes,Ua: NEGATIVE
Nitrite: NEGATIVE
Protein, ur: NEGATIVE mg/dL
Specific Gravity, Urine: 1.008 (ref 1.005–1.030)
pH: 6 (ref 5.0–8.0)

## 2018-06-29 LAB — T4: T4, Total: 6.9 ug/dL (ref 4.5–12.0)

## 2018-07-28 DIAGNOSIS — I2510 Atherosclerotic heart disease of native coronary artery without angina pectoris: Secondary | ICD-10-CM | POA: Diagnosis not present

## 2018-07-28 DIAGNOSIS — I10 Essential (primary) hypertension: Secondary | ICD-10-CM | POA: Diagnosis not present

## 2018-08-27 DIAGNOSIS — I10 Essential (primary) hypertension: Secondary | ICD-10-CM | POA: Diagnosis not present

## 2018-08-27 DIAGNOSIS — E039 Hypothyroidism, unspecified: Secondary | ICD-10-CM | POA: Diagnosis not present

## 2018-09-27 DIAGNOSIS — I10 Essential (primary) hypertension: Secondary | ICD-10-CM | POA: Diagnosis not present

## 2018-09-27 DIAGNOSIS — I2510 Atherosclerotic heart disease of native coronary artery without angina pectoris: Secondary | ICD-10-CM | POA: Diagnosis not present

## 2018-10-04 DIAGNOSIS — H2511 Age-related nuclear cataract, right eye: Secondary | ICD-10-CM | POA: Diagnosis not present

## 2018-10-04 DIAGNOSIS — H25011 Cortical age-related cataract, right eye: Secondary | ICD-10-CM | POA: Diagnosis not present

## 2018-10-29 DIAGNOSIS — I2510 Atherosclerotic heart disease of native coronary artery without angina pectoris: Secondary | ICD-10-CM | POA: Diagnosis not present

## 2018-10-29 DIAGNOSIS — I10 Essential (primary) hypertension: Secondary | ICD-10-CM | POA: Diagnosis not present

## 2018-11-03 ENCOUNTER — Telehealth: Payer: Self-pay | Admitting: Cardiovascular Disease

## 2018-11-03 NOTE — Telephone Encounter (Signed)
Virtual Visit Pre-Appointment Phone Call  "(Name), I am calling you today to discuss your upcoming appointment. We are currently trying to limit exposure to the virus that causes COVID-19 by seeing patients at home rather than in the office."  1. "What is the BEST phone number to call the day of the visit?" - include this in appointment notes  2. Do you have or have access to (through a family member/friend) a smartphone with video capability that we can use for your visit?" a. If yes - list this number in appt notes as cell (if different from BEST phone #) and list the appointment type as a VIDEO visit in appointment notes b. If no - list the appointment type as a PHONE visit in appointment notes  3. Confirm consent - "In the setting of the current Covid19 crisis, you are scheduled for a (phone or video) visit with your provider on (date) at (time).  Just as we do with many in-office visits, in order for you to participate in this visit, we must obtain consent.  If you'd like, I can send this to your mychart (if signed up) or email for you to review.  Otherwise, I can obtain your verbal consent now.  All virtual visits are billed to your insurance company just like a normal visit would be.  By agreeing to a virtual visit, we'd like you to understand that the technology does not allow for your provider to perform an examination, and thus may limit your provider's ability to fully assess your condition. If your provider identifies any concerns that need to be evaluated in person, we will make arrangements to do so.  Finally, though the technology is pretty good, we cannot assure that it will always work on either your or our end, and in the setting of a video visit, we may have to convert it to a phone-only visit.  In either situation, we cannot ensure that we have a secure connection.  Are you willing to proceed?" STAFF: Did the patient verbally acknowledge consent to telehealth visit? Document  YES/NO here: yes  4. Advise patient to be prepared - "Two hours prior to your appointment, go ahead and check your blood pressure, pulse, oxygen saturation, and your weight (if you have the equipment to check those) and write them all down. When your visit starts, your provider will ask you for this information. If you have an Apple Watch or Kardia device, please plan to have heart rate information ready on the day of your appointment. Please have a pen and paper handy nearby the day of the visit as well."  5. Give patient instructions for MyChart download to smartphone OR [email or web address] as below if video visit (depending on what platform provider is using)  6. Inform patient they will receive a phone call 15 minutes prior to their appointment time (may be from unknown caller ID) so they should be prepared to answer    TELEPHONE CALL NOTE  [name] [name] has been deemed a candidate for a follow-up tele-health visit to limit community exposure during the Covid-19 pandemic. I spoke with the patient via phone to ensure availability of phone/video source, confirm preferred email & phone number, and discuss instructions and expectations.  I reminded [name] [name] to be prepared with any vital sign and/or heart rhythm information that could potentially be obtained via home monitoring, at the time of her visit. I reminded [name] [name] to expect a phone call prior to  her visit.  [name] [name] 11/03/2018 1:52 PM   INSTRUCTIONS FOR DOWNLOADING THE MYCHART APP TO SMARTPHONE  - The patient must first make sure to have activated MyChart and know their login information - If Apple, go to [employer] and type in MyChart in the search bar and download the app. If Android, ask patient to go to [employer] and type in [place] in the search bar and download the app. The app is free but as with any other app downloads, their phone may require them to verify saved payment information or  Apple/Android password.  - The patient will need to then log into the app with their MyChart username and password, and select [facility] as their healthcare provider to link the account. When it is time for your visit, go to the MyChart app, find appointments, and click Begin Video Visit. Be sure to Select Allow for your device to access the Microphone and Camera for your visit. You will then be connected, and your provider will be with you shortly.  **If they have any issues connecting, or need assistance please contact MyChart service desk [phone])**  **If using a computer, in order to ensure the best quality for their visit they will need to use either of the following Internet Browsers: [employer], or Google Chrome**  IF USING DOXIMITY or DOXY.ME - The patient will receive a link just prior to their visit by text.     FULL LENGTH CONSENT FOR TELE-HEALTH VISIT   I hereby voluntarily request, consent and authorize [facility] and its employed or contracted physicians, physician assistants, nurse practitioners or other licensed health care professionals (the Practitioner), to provide me with telemedicine health care services (the Services") as deemed necessary by the treating Practitioner. I acknowledge and consent to receive the Services by the Practitioner via telemedicine. I understand that the telemedicine visit will involve communicating with the Practitioner through live audiovisual communication technology and the disclosure of certain medical information by electronic transmission. I acknowledge that I have been given the opportunity to request an in-person assessment or other available alternative prior to the telemedicine visit and am voluntarily participating in the telemedicine visit.  I understand that I have the right to withhold or withdraw my consent to the use of telemedicine in the course of my care at any time, without affecting my right to future care  or treatment, and that the Practitioner or I may terminate the telemedicine visit at any time. I understand that I have the right to inspect all information obtained and/or recorded in the course of the telemedicine visit and may receive copies of available information for a reasonable fee.  I understand that some of the potential risks of receiving the Services via telemedicine include:   Delay or interruption in medical evaluation due to technological equipment failure or disruption;  Information transmitted may not be sufficient (e.g. poor resolution of images) to allow for appropriate medical decision making by the Practitioner; and/or   In rare instances, security protocols could fail, causing a breach of personal health information.  Furthermore, I acknowledge that it is my responsibility to provide information about my medical history, conditions and care that is complete and accurate to the best of my ability. I acknowledge that Practitioner's advice, recommendations, and/or decision may be based on factors not within their control, such as incomplete or inaccurate data provided by me or distortions of diagnostic images or specimens that may result from electronic transmissions. I  understand that the practice of medicine is not an exact science and that Practitioner makes no warranties or guarantees regarding treatment outcomes. I acknowledge that I will receive a copy of this consent concurrently upon execution via email to the email address I last provided but may also request a printed copy by calling the office of [facility].    I understand that my insurance will be billed for this visit.   I have read or had this consent read to me.  I understand the contents of this consent, which adequately explains the benefits and risks of the Services being provided via telemedicine.   I have been provided ample opportunity to ask questions regarding this consent and the Services and have had  my questions answered to my satisfaction.  I give my informed consent for the services to be provided through the use of telemedicine in my medical care  By participating in this telemedicine visit I agree to the above.

## 2018-11-08 ENCOUNTER — Encounter: Payer: Self-pay | Admitting: Cardiovascular Disease

## 2018-11-08 ENCOUNTER — Telehealth (INDEPENDENT_AMBULATORY_CARE_PROVIDER_SITE_OTHER): Payer: Medicare HMO | Admitting: Cardiovascular Disease

## 2018-11-08 VITALS — BP 124/80 | HR 66 | Ht 63.5 in | Wt 170.0 lb

## 2018-11-08 DIAGNOSIS — I10 Essential (primary) hypertension: Secondary | ICD-10-CM

## 2018-11-08 DIAGNOSIS — I2510 Atherosclerotic heart disease of native coronary artery without angina pectoris: Secondary | ICD-10-CM | POA: Diagnosis not present

## 2018-11-08 DIAGNOSIS — E785 Hyperlipidemia, unspecified: Secondary | ICD-10-CM

## 2018-11-08 MED ORDER — PRAVASTATIN SODIUM 20 MG PO TABS: 10.0000 mg | ORAL_TABLET | Freq: Every day | ORAL | 6 refills | Status: DC

## 2018-11-08 NOTE — Progress Notes (Signed)
Virtual Visit via Telephone Note   This visit type was conducted due to national recommendations for restrictions regarding the COVID-19 Pandemic (e.g. social distancing) in an effort to limit this patient's exposure and mitigate transmission in our community.  Due to her co-morbid illnesses, this patient is at least at moderate risk for complications without adequate follow up.  This format is felt to be most appropriate for this patient at this time.  The patient did not have access to video technology/had technical difficulties with video requiring transitioning to audio format only (telephone).  All issues noted in this document were discussed and addressed.  No physical exam could be performed with this format.  Please refer to the patient's chart for her  consent to telehealth for [facility].   Date:  11/08/2018   ID:  [name] [name], DOB 1945/12/22, MRN [id number]  Patient Location: Home Provider Location: Office  PCP:  Rosita Fire, MD  Cardiologist:  Kate Sable, MD  Electrophysiologist:  None   Evaluation Performed:  Follow-Up Visit  Chief Complaint:  CAD  History of Present Illness:    [name] [name] is a 73 y.o. female with a history of coronary artery disease.  In summary, she was admitted 09/20/13 to 09/22/13 with chest pain and sustained a NSTEMI. Cath 09/20/13 showed distally occluded OM1 60-80%. It was treated medically at that time and she was discharged home. She was readmitted with chest pain a few days later, and repeat cath on 09/27/13 showed subtotal occlusion of a medium caliber highly tortuous OM worse from prior study, described as unfavorable for PCI, though if refractory angina PCI could be considered per notes.  09/21/13 Echo LVEF 55-60%.  PMH also includes HTN, hyperlipidemia, pre-diabetes mellitus, and OSA .  She has previously been unable to tolerate Ranexa, Imdur, amlodipine, and lisinopril.  Low risk nuclear stress test (09/24/15).  She  previously stopped stopped taking aspirin because it led to a burning sensation in her stomach as well as diarrhea and also stopped Crestor.  She is taking Toprol-XL.  The patient denies any symptoms of chest pain, palpitations, shortness of breath, lightheadedness, dizziness, leg swelling, orthopnea, PND, and syncope.  The patient does not have symptoms concerning for COVID-19 infection (fever, chills, cough, or new shortness of breath).   Soc Hx:Her adopted daughter studied sociology and business at [employer] and now teaches history at [employer] in [place].  She also has a 40 year old adopted son. He's in 10th grade.   Past Medical History:  Diagnosis Date  . Anxiety   . Heart attack ([place])   . History of echocardiogram    a. 2D ECHO: 09/21/2013; EF 55-60%; mild concentric hypertrophy, G1DD, mild LA dilation, PA pressure 37. Small RV pericardial effusion  . Hypertension   . Hypothyroidism   . NSTEMI (non-ST elevated myocardial infarction) ([place])    a.  Related to occlusion of the distal Cx. The mid to distal obtuse marginal #1 also contains a significant stenosis that is best treated with medical therapy. Patent RCA/LAD with tortuosity. LM patent. EF 55%.   . Sleep apnea   . Thyroid disease    Past Surgical History:  Procedure Laterality Date  . ABDOMINAL HYSTERECTOMY    . CARDIAC CATHETERIZATION  ~ 2000  . CORONARY ANGIOPLASTY  09/20/2013  . LEFT HEART CATHETERIZATION WITH CORONARY ANGIOGRAM N/A 09/20/2013   Procedure: LEFT HEART CATHETERIZATION WITH CORONARY ANGIOGRAM;  Surgeon: Sinclair Grooms, MD;  Location: [facility] CATH LAB;  Service:  Cardiovascular;  Laterality: N/A;  . LEFT HEART CATHETERIZATION WITH CORONARY ANGIOGRAM N/A 09/27/2013   Procedure: LEFT HEART CATHETERIZATION WITH CORONARY ANGIOGRAM;  Surgeon: Blane Ohara, MD;  Location: [facility] C[facility] LAB;  Service: Cardiovascular;  Laterality: N/A;  . THYROIDECTOMY  10/08/2011   Procedure: THYROIDECTOMY;  Surgeon: [name],  MD;  Location: [facility] ORS;  Service: General;  Laterality: N/A;  Total  . VAGINAL HYSTERECTOMY  1993     Current Meds  Medication Sig  . levothyroxine (SYNTHROID, LEVOTHROID) 125 MCG tablet Take by mouth.  . metoprolol succinate (TOPROL-XL) 100 MG 24 hr tablet Take 100 mg by mouth every other day.   . Multiple Vitamins-Minerals (MULTIVITAMINS THER. W/MINERALS) TABS tablet Take 1 tablet by mouth daily.  . nitroGLYCERIN (NITROSTAT) 0.4 MG SL tablet DISSOLVE ONE TABLET UNDER TONGUE EVERY FIVE MINUTES AS NEEDED FOR CHEST PAIN     Allergies:   Fluviral [flu virus vaccine], Amlodipine, Lisinopril, and Ranexa [ranolazine]   Social History   Tobacco Use  . Smoking status: Never Smoker  . Smokeless tobacco: Never Used  Substance Use Topics  . Alcohol use: No    Alcohol/week: 0.0 standard drinks  . Drug use: No     Family Hx: The patient's family history is not on file.  ROS:   Please see the history of present illness.     All other systems reviewed and are negative.   Prior CV studies:   The following studies were reviewed today:  Reviewed above  Labs/Other Tests and Data Reviewed:    EKG:  No ECG reviewed.  Recent Labs: 06/28/2018: BUN 19; Creatinine, Ser 0.99; Hemoglobin 17.0; Platelets 613; Potassium 3.4; Sodium 141; TSH 4.703   Recent Lipid Panel Lab Results  Component Value Date/Time   CHOL 177 06/28/2018 01:04 PM   TRIG 96 06/28/2018 01:04 PM   HDL 51 06/28/2018 01:04 PM   CHOLHDL 3.5 06/28/2018 01:04 PM   LDLCALC 107 (H) 06/28/2018 01:04 PM    Wt Readings from Last 3 Encounters:  11/08/18 170 lb (77.1 kg)  02/21/18 189 lb (85.7 kg)  10/20/17 170 lb (77.1 kg)     Objective:    Vital Signs:  BP 124/80   Pulse 66   Ht 5' 3.5" (1.613 m)   Wt 170 lb (77.1 kg)   BMI 29.64 kg/m    VITAL SIGNS:  reviewed  ASSESSMENT & PLAN:    1. [id number] ischemic heart disease.  Was not able to tolerate aspirin or Crestor.  Currently taking Toprol-XL.  2. Essential  HTN: Blood pressure is normal.  No changes to therapy.  3. Hyperlipidemia:  Did not tolerate Crestor.  LDL 107 on 06/28/2018. I will start pravastatin 10 mg daily.  4. Bilateral leg pain: No significant peripheral arterial disease by ABIs on 02/23/2018.    COVID-19 Education: The signs and symptoms of COVID-19 were discussed with the patient and how to seek care for testing (follow up with PCP or arrange E-visit).  The importance of social distancing was discussed today.  Time:   Today, I have spent 5 minutes with the patient with telehealth technology discussing the above problems.     Medication Adjustments/Labs and Tests Ordered: Current medicines are reviewed at length with the patient today.  Concerns regarding medicines are outlined above.   Tests Ordered: No orders of the defined types were placed in this encounter.   Medication Changes: No orders of the defined types were placed in this encounter.   Follow Up:  Virtual Visit or In Person in 6 month(s)  Signed, Kate Sable, MD  11/08/2018 11:02 AM    [place]

## 2018-11-08 NOTE — Addendum Note (Signed)
Addended by: Laurine Blazer on: 11/08/2018 12:10 PM   Modules accepted: Orders

## 2018-11-08 NOTE — Patient Instructions (Addendum)
Medication Instructions:   Begin Pravastatin 10mg  daily for elevated cholesterol.  (You will have to break a 20mg  tablet in half.)  New prescription sent to [facility] today.    Continue all other medications.    Labwork:  Lipids - due just prior to next office visit.  Will mail reminder when time.  Testing/Procedures: none  Follow-Up: Your physician wants you to follow up in: 6 months.  You will receive a reminder letter in the mail one-two months in advance.  If you don't receive a letter, please call our office to schedule the follow up appointment   Any Other Special Instructions Will Be Listed Below (If Applicable).  If you need a refill on your cardiac medications before your next appointment, please call your pharmacy.

## 2018-11-28 DIAGNOSIS — I2510 Atherosclerotic heart disease of native coronary artery without angina pectoris: Secondary | ICD-10-CM | POA: Diagnosis not present

## 2018-11-28 DIAGNOSIS — I10 Essential (primary) hypertension: Secondary | ICD-10-CM | POA: Diagnosis not present

## 2018-11-28 DIAGNOSIS — J309 Allergic rhinitis, unspecified: Secondary | ICD-10-CM | POA: Diagnosis not present

## 2018-12-29 DIAGNOSIS — I2510 Atherosclerotic heart disease of native coronary artery without angina pectoris: Secondary | ICD-10-CM | POA: Diagnosis not present

## 2018-12-29 DIAGNOSIS — I10 Essential (primary) hypertension: Secondary | ICD-10-CM | POA: Diagnosis not present

## 2019-01-28 DIAGNOSIS — I2510 Atherosclerotic heart disease of native coronary artery without angina pectoris: Secondary | ICD-10-CM | POA: Diagnosis not present

## 2019-01-28 DIAGNOSIS — I10 Essential (primary) hypertension: Secondary | ICD-10-CM | POA: Diagnosis not present

## 2019-02-28 DIAGNOSIS — I2510 Atherosclerotic heart disease of native coronary artery without angina pectoris: Secondary | ICD-10-CM | POA: Diagnosis not present

## 2019-02-28 DIAGNOSIS — E039 Hypothyroidism, unspecified: Secondary | ICD-10-CM | POA: Diagnosis not present

## 2019-02-28 DIAGNOSIS — I10 Essential (primary) hypertension: Secondary | ICD-10-CM | POA: Diagnosis not present

## 2019-03-31 DIAGNOSIS — E039 Hypothyroidism, unspecified: Secondary | ICD-10-CM | POA: Diagnosis not present

## 2019-03-31 DIAGNOSIS — I2510 Atherosclerotic heart disease of native coronary artery without angina pectoris: Secondary | ICD-10-CM | POA: Diagnosis not present

## 2019-03-31 DIAGNOSIS — J309 Allergic rhinitis, unspecified: Secondary | ICD-10-CM | POA: Diagnosis not present

## 2019-04-28 DIAGNOSIS — I10 Essential (primary) hypertension: Secondary | ICD-10-CM | POA: Diagnosis not present

## 2019-04-28 DIAGNOSIS — E039 Hypothyroidism, unspecified: Secondary | ICD-10-CM | POA: Diagnosis not present

## 2019-05-18 ENCOUNTER — Telehealth: Payer: Self-pay | Admitting: Cardiovascular Disease

## 2019-05-18 NOTE — Telephone Encounter (Signed)
  Patient Consent for Virtual Visit         [name] [name] has provided verbal consent on 05/18/2019 for a virtual visit (video or telephone).   CONSENT FOR VIRTUAL VISIT FOR:  [name] [name]  By participating in this virtual visit I agree to the following:  I hereby voluntarily request, consent and authorize [facility] and its employed or contracted physicians, physician assistants, nurse practitioners or other licensed health care professionals (the Practitioner), to provide me with telemedicine health care services (the "Services") as deemed necessary by the treating Practitioner. I acknowledge and consent to receive the Services by the Practitioner via telemedicine. I understand that the telemedicine visit will involve communicating with the Practitioner through live audiovisual communication technology and the disclosure of certain medical information by electronic transmission. I acknowledge that I have been given the opportunity to request an in-person assessment or other available alternative prior to the telemedicine visit and am voluntarily participating in the telemedicine visit.  I understand that I have the right to withhold or withdraw my consent to the use of telemedicine in the course of my care at any time, without affecting my right to future care or treatment, and that the Practitioner or I may terminate the telemedicine visit at any time. I understand that I have the right to inspect all information obtained and/or recorded in the course of the telemedicine visit and may receive copies of available information for a reasonable fee.  I understand that some of the potential risks of receiving the Services via telemedicine include:  [name] Delay or interruption in medical evaluation due to technological equipment failure or disruption; . Information transmitted may not be sufficient (e.g. poor resolution of images) to allow for appropriate medical decision making by the  Practitioner; and/or  . In rare instances, security protocols could fail, causing a breach of personal health information.  Furthermore, I acknowledge that it is my responsibility to provide information about my medical history, conditions and care that is complete and accurate to the best of my ability. I acknowledge that Practitioner's advice, recommendations, and/or decision may be based on factors not within their control, such as incomplete or inaccurate data provided by me or distortions of diagnostic images or specimens that may result from electronic transmissions. I understand that the practice of medicine is not an exact science and that Practitioner makes no warranties or guarantees regarding treatment outcomes. I acknowledge that a copy of this consent can be made available to me via my patient portal ([facility]), or I can request a printed copy by calling the office of [facility].    I understand that my insurance will be billed for this visit.   I have read or had this consent read to me. . I understand the contents of this consent, which adequately explains the benefits and risks of the Services being provided via telemedicine.  . I have been provided ample opportunity to ask questions regarding this consent and the Services and have had my questions answered to my satisfaction. . I give my informed consent for the services to be provided through the use of telemedicine in my medical care

## 2019-05-22 ENCOUNTER — Telehealth (INDEPENDENT_AMBULATORY_CARE_PROVIDER_SITE_OTHER): Payer: Medicare HMO | Admitting: Cardiovascular Disease

## 2019-05-22 ENCOUNTER — Encounter: Payer: Self-pay | Admitting: Cardiovascular Disease

## 2019-05-22 VITALS — BP 140/64 | Ht 63.0 in | Wt 172.0 lb

## 2019-05-22 DIAGNOSIS — I10 Essential (primary) hypertension: Secondary | ICD-10-CM | POA: Diagnosis not present

## 2019-05-22 DIAGNOSIS — E785 Hyperlipidemia, unspecified: Secondary | ICD-10-CM

## 2019-05-22 DIAGNOSIS — Z789 Other specified health status: Secondary | ICD-10-CM | POA: Diagnosis not present

## 2019-05-22 DIAGNOSIS — I25118 Atherosclerotic heart disease of native coronary artery with other forms of angina pectoris: Secondary | ICD-10-CM | POA: Diagnosis not present

## 2019-05-22 NOTE — Patient Instructions (Addendum)
Medication Instructions:  Continue all current medications.  Labwork:  Lipids - order enclosed.   Reminder:  Nothing to eat or drink after 12 midnight prior to labs.  You may do these labs at [facility] or [place] across the street.   Office will contact with results via phone or letter.    Testing/Procedures: none  Follow-Up: 6 months   Any Other Special Instructions Will Be Listed Below (If Applicable).  If you need a refill on your cardiac medications before your next appointment, please call your pharmacy.

## 2019-05-22 NOTE — Addendum Note (Signed)
Addended by: Laurine Blazer on: 05/22/2019 02:05 PM   Modules accepted: Orders

## 2019-05-22 NOTE — Progress Notes (Signed)
Virtual Visit via Telephone Note   This visit type was conducted due to national recommendations for restrictions regarding the COVID-19 Pandemic (e.g. social distancing) in an effort to limit this patient's exposure and mitigate transmission in our community.  Due to her co-morbid illnesses, this patient is at least at moderate risk for complications without adequate follow up.  This format is felt to be most appropriate for this patient at this time.  The patient did not have access to video technology/had technical difficulties with video requiring transitioning to audio format only (telephone).  All issues noted in this document were discussed and addressed.  No physical exam could be performed with this format.  Please refer to the patient's chart for her  consent to telehealth for [facility].   The patient was identified using 2 identifiers.  Date:  05/22/2019   ID:  [name] [name], DOB [birth year], MRN [id number]  Patient Location: Home Provider Location: Office  PCP:  Rosita Fire, MD  Cardiologist:  Kate Sable, MD  Electrophysiologist:  None   Evaluation Performed:  Follow-Up Visit  Chief Complaint:  CAD, HTN  History of Present Illness:    [name] [name] is a 74 y.o. female with with a history of coronary artery disease. In summary, she was admitted 09/20/13 to 09/22/13 with chest pain and sustained a NSTEMI. Cath 09/20/13 showed distally occluded OM1 60-80%. It was treated medically at that time and she was discharged home. She was readmitted with chest pain a few days later, and repeat cath on 09/27/13 showed subtotal occlusion of a medium caliber highly tortuous OM worse from prior study, described as unfavorable for PCI, though if refractory angina PCI could be considered per notes.  09/21/13 Echo LVEF 55-60%.  PMH also includes HTN, hyperlipidemia, pre-diabetes mellitus, and OSA .  She has previously been unable to tolerate Ranexa, Imdur, amlodipine, and  lisinopril.  Low risk nuclear stress test (09/24/15).  The patient denies any symptoms of chest pain, palpitations, shortness of breath, lightheadedness, dizziness, leg swelling, orthopnea, PND, and syncope.  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 She also has a 61 year old adopted son. He's in 10th grade and wants to be a Animal nutritionist.   Past Medical History:  Diagnosis Date  . Anxiety   . Heart attack ([place])   . History of echocardiogram    a. 2D ECHO: 09/21/2013; EF 55-60%; mild concentric hypertrophy, G1DD, mild LA dilation, PA pressure 37. Small RV pericardial effusion  . Hypertension   . Hypothyroidism   . NSTEMI (non-ST elevated myocardial infarction) ([place])    a.  Related to occlusion of the distal Cx. The mid to distal obtuse marginal #1 also contains a significant stenosis that is best treated with medical therapy. Patent RCA/LAD with tortuosity. LM patent. EF 55%.   . Sleep apnea   . Thyroid disease    Past Surgical History:  Procedure Laterality Date  . ABDOMINAL HYSTERECTOMY    . CARDIAC CATHETERIZATION  ~ 2000  . CORONARY ANGIOPLASTY  09/20/2013  . LEFT HEART CATHETERIZATION WITH CORONARY ANGIOGRAM N/A 09/20/2013   Procedure: LEFT HEART CATHETERIZATION WITH CORONARY ANGIOGRAM;  Surgeon: Sinclair Grooms, MD;  [facility] C[facility] LAB;  Service: Cardiovascular;  Laterality: N/A;  . LEFT HEART CATHETERIZATION WITH CORONARY ANGIOGRAM N/A 09/27/2013   Procedure: LEFT HEART CATHETERIZATION WITH CORONARY ANGIOGRAM;  Surgeon: Blane Ohara, MD;  Location: [facility] CATH LAB;  Service: Cardiovascular;  Laterality: N/A;  .  THYROIDECTOMY  10/08/2011   Procedure: THYROIDECTOMY;  Surgeon: [name], MD;  Location: [facility] ORS;  Service: General;  Laterality: N/A;  Total  . VAGINAL HYSTERECTOMY  1993     Current Meds  Medication Sig  . levothyroxine (SYNTHROID, LEVOTHROID) 125 MCG tablet Take 125 mcg  by mouth daily.   . metoprolol succinate (TOPROL-XL) 100 MG 24 hr tablet Take 50 mg by mouth daily.   [name] MISC NATURAL PRODUCTS PO Take 2 tablets by mouth 2 (two) times daily. Imflama Free with Turmeric  . Multiple Vitamins-Minerals (MULTIVITAMINS THER. W/MINERALS) TABS tablet Take 1 tablet by mouth daily.  . nitroGLYCERIN (NITROSTAT) 0.4 MG SL tablet DISSOLVE ONE TABLET UNDER TONGUE EVERY FIVE MINUTES AS NEEDED FOR CHEST PAIN     Allergies:   Fluviral [flu virus vaccine], Amlodipine, Lisinopril, and Ranexa [ranolazine]   Social History   Tobacco Use  . Smoking status: Never Smoker  . Smokeless tobacco: Never Used  Substance Use Topics  . Alcohol use: No    Alcohol/week: 0.0 standard drinks  . Drug use: No     Family Hx: The patient's family history is not on file.  ROS:   Please see the history of present illness.     All other systems reviewed and are negative.   Prior CV studies:   The following studies were reviewed today:  Reviewed above  Labs/Other Tests and Data Reviewed:    EKG:  No ECG reviewed.  Recent Labs: 06/28/2018: BUN 19; Creatinine, Ser 0.99; Hemoglobin 17.0; Platelets 613; Potassium 3.4; Sodium 141; TSH 4.703   Recent Lipid Panel Lab Results  Component Value Date/Time   CHOL 177 06/28/2018 01:04 PM   TRIG 96 06/28/2018 01:04 PM   HDL 51 06/28/2018 01:04 PM   CHOLHDL 3.5 06/28/2018 01:04 PM   LDLCALC 107 (H) 06/28/2018 01:04 PM    Wt Readings from Last 3 Encounters:  05/22/19 172 lb (78 kg)  11/08/18 170 lb (77.1 kg)  02/21/18 189 lb (85.7 kg)     Objective:    Vital Signs:  BP 140/64   Ht 5\' 3"  (1.6 m)   Wt 172 lb (78 kg)   BMI 30.47 kg/m    VITAL SIGNS:  reviewed  ASSESSMENT & PLAN:    1. [id number] ischemic heart disease.Was not able to tolerate aspirin or Crestor. Currently taking Toprol-XL 50 mg daily.  2. Essential [id number] pressure is very mildly elevated. No changes today.  3. Hyperlipidemia: Did not tolerate  Crestor nor pravastatin, the latter of which I tried in Sept 2020.  LDL 107 on 06/28/2018. I will check lipids. She may need PCSK-9 inhibitors.    COVID-19 Education: The signs and symptoms of COVID-19 were discussed with the patient and how to seek care for testing (follow up with PCP or arrange E-visit).  The importance of social distancing was discussed today.  Time:   Today, I have spent 20 minutes with the patient with telehealth technology discussing the above problems.     Medication Adjustments/Labs and Tests Ordered: Current medicines are reviewed at length with the patient today.  Concerns regarding medicines are outlined above.   Tests Ordered: No orders of the defined types were placed in this encounter.   Medication Changes: No orders of the defined types were placed in this encounter.   Follow Up:  Office visit in 6 months  Signed, Kate Sable, MD  05/22/2019 12:38 PM    [name]

## 2019-05-29 DIAGNOSIS — I2510 Atherosclerotic heart disease of native coronary artery without angina pectoris: Secondary | ICD-10-CM | POA: Diagnosis not present

## 2019-05-29 DIAGNOSIS — I10 Essential (primary) hypertension: Secondary | ICD-10-CM | POA: Diagnosis not present

## 2019-06-27 ENCOUNTER — Other Ambulatory Visit (HOSPITAL_COMMUNITY)
Admission: RE | Admit: 2019-06-27 | Discharge: 2019-06-27 | Disposition: A | Discharge status: Home / Self Care | Payer: Medicare HMO | Source: Ambulatory Visit | Attending: Cardiovascular Disease | Admitting: Cardiovascular Disease

## 2019-06-27 DIAGNOSIS — E785 Hyperlipidemia, unspecified: Secondary | ICD-10-CM | POA: Diagnosis not present

## 2019-06-27 DIAGNOSIS — Z789 Other specified health status: Secondary | ICD-10-CM | POA: Diagnosis not present

## 2019-06-27 LAB — LIPID PANEL
Cholesterol: 170 mg/dL (ref 0–200)
HDL: 55 mg/dL (ref >40)
LDL Cholesterol: 103 mg/dL — ABNORMAL HIGH (ref 0–99)
Total CHOL/HDL Ratio: 3.1 RATIO
Triglycerides: 58 mg/dL (ref <150)
VLDL: 12 mg/dL (ref 0–40)

## 2019-07-10 ENCOUNTER — Telehealth: Payer: Self-pay | Admitting: Cardiovascular Disease

## 2019-07-10 NOTE — Telephone Encounter (Signed)
Laurine Blazer, [place]  [id number] AM EDT    Left message to return call.   Herminio Commons, MD  06/27/2019 11:36 AM EDT    LDL elevated. Statin intolerant. Start Repatha.

## 2019-07-10 NOTE — Telephone Encounter (Signed)
Returning call.

## 2019-07-10 NOTE — Telephone Encounter (Signed)
Patient called for recent lab testing.

## 2019-07-11 NOTE — Telephone Encounter (Signed)
[name] [name], [place]  [id number] AM EDT    Notified, copy to pcp. Has follow up this Friday & will discuss with provider at that time. Not sure she wants to do injections.

## 2019-07-13 ENCOUNTER — Other Ambulatory Visit: Payer: Self-pay

## 2019-07-13 ENCOUNTER — Ambulatory Visit (INDEPENDENT_AMBULATORY_CARE_PROVIDER_SITE_OTHER): Payer: Medicare HMO | Admitting: Family Medicine

## 2019-07-13 ENCOUNTER — Encounter: Payer: Self-pay | Admitting: Family Medicine

## 2019-07-13 VITALS — BP 190/105 | HR 63 | Ht 63.0 in | Wt 191.6 lb

## 2019-07-13 DIAGNOSIS — I2510 Atherosclerotic heart disease of native coronary artery without angina pectoris: Secondary | ICD-10-CM

## 2019-07-13 DIAGNOSIS — E785 Hyperlipidemia, unspecified: Secondary | ICD-10-CM

## 2019-07-13 DIAGNOSIS — I10 Essential (primary) hypertension: Secondary | ICD-10-CM

## 2019-07-13 MED ORDER — HYDRALAZINE HCL 25 MG PO TABS: 25.0000 mg | ORAL_TABLET | Freq: Four times a day (QID) | ORAL | 6 refills | Status: DC

## 2019-07-13 MED ORDER — CHLORTHALIDONE 25 MG PO TABS: 25.0000 mg | ORAL_TABLET | Freq: Every day | ORAL | 6 refills | Status: DC

## 2019-07-13 NOTE — Progress Notes (Signed)
Cardiology Office Note  Date: 07/13/2019   ID: [name], [name] 12-28-1945, MRN [id number]  PCP:  Rosita Fire, MD  Cardiologist:  Kate Sable, MD Electrophysiologist:  None   Chief Complaint: Follow-up CAD  History of Present Illness: [name] [name] is a 74 y.o. female with a history of CAD, NSTEMI, HTN, HLD, DM2 and OSA  August 2015 chest pain with NSTEMI.  Cardiac cath showed distally occluded OM1 60 to 80%.  It was treated medically at that time and she was discharged home.  She is admitted with chest pain a few days later and repeat catheterization showed some total occlusion of medium caliber were highly tortuous OM 1 worse from prior study.  Described as unfavorable for PCI though if refractory angina PCI could be considered per the notes.  09/2013 echo EF 55 to 60%.  Had a low risk stress test August 2017.  Previously intolerant of Ranexa, Imdur, amlodipine, lisinopril.  Intolerant of Crestor and pravastatin.  Last LDL 107 on 2018-06-28  Last saw Dr. Bronson Ing 2019-05-22 via telemedicine.  Her blood pressure was mildly elevated.  Her coronary artery disease was stable.  He stated she may need PCSK9 inhibitors in the future due to intolerance to statins.  She presents today with complaints of tingling in her right foot with complaints of recurrent headaches.  Also complaining of recent sinus issues which she believes may be contributing to her headache.  Her initial blood pressure on arrival was 118/115.  Recheck manually was 190/105.  She is supposed to be taking chlorthalidone 25 mg daily.  She is not taking as directed.  States she took 1 chlorthalidone the other day due to feeling a headache and feeling like she was filling up with fluid.  Denies any other anginal or exertional symptoms, stroke or TIA-like symptoms, orthostatic symptoms, palpitations or arrhythmias, claudication-like symptoms, or lower extremity edema.  Past Medical History:  Diagnosis Date  .  Anxiety   . Heart attack ([place])   . History of echocardiogram    a. 2D ECHO: 09/21/2013; EF 55-60%; mild concentric hypertrophy, G1DD, mild LA dilation, PA pressure 37. Small RV pericardial effusion  . Hypertension   . Hypothyroidism   . NSTEMI (non-ST elevated myocardial infarction) ([place])    a.  Related to occlusion of the distal Cx. The mid to distal obtuse marginal #1 also contains a significant stenosis that is best treated with medical therapy. Patent RCA/LAD with tortuosity. LM patent. EF 55%.   . Sleep apnea   . Thyroid disease     Past Surgical History:  Procedure Laterality Date  . ABDOMINAL HYSTERECTOMY    . CARDIAC CATHETERIZATION  ~ 2000  . CORONARY ANGIOPLASTY  09/20/2013  . LEFT HEART CATHETERIZATION WITH CORONARY ANGIOGRAM N/A 09/20/2013   Procedure: LEFT HEART CATHETERIZATION WITH CORONARY ANGIOGRAM;  Surgeon: Sinclair Grooms, MD;  Location: [facility] CATH LAB;  Service: Cardiovascular;  Laterality: N/A;  . LEFT HEART CATHETERIZATION WITH CORONARY ANGIOGRAM N/A 09/27/2013   Procedure: LEFT HEART CATHETERIZATION WITH CORONARY ANGIOGRAM;  Surgeon: Blane Ohara, MD;  Location: [facility] CATH LAB;  Service: Cardiovascular;  Laterality: N/A;  . THYROIDECTOMY  10/08/2011   Procedure: THYROIDECTOMY;  Surgeon: [name], MD;  Location: [facility] ORS;  Service: General;  Laterality: N/A;  Total  . VAGINAL HYSTERECTOMY  1993    Current Outpatient Medications  Medication Sig Dispense Refill  . levothyroxine (SYNTHROID, LEVOTHROID) 125 MCG tablet Take 125 mcg by mouth daily.     [name]  metoprolol succinate (TOPROL-XL) 100 MG 24 hr tablet Take 50 mg by mouth daily.     [name] MISC NATURAL PRODUCTS PO Take 2 tablets by mouth 2 (two) times daily. Imflama Free with Turmeric    . Multiple Vitamins-Minerals (MULTIVITAMINS THER. W/MINERALS) TABS tablet Take 1 tablet by mouth daily.    . nitroGLYCERIN (NITROSTAT) 0.4 MG SL tablet DISSOLVE ONE TABLET UNDER TONGUE EVERY FIVE MINUTES AS NEEDED FOR CHEST PAIN 25  tablet 3  . chlorthalidone (HYGROTON) 25 MG tablet Take 1 tablet (25 mg total) by mouth daily. 30 tablet 6  . hydrALAZINE (APRESOLINE) 25 MG tablet Take 1 tablet (25 mg total) by mouth 4 (four) times daily. 120 tablet 6   No current facility-administered medications for this visit.   Allergies:  Fluviral [flu virus vaccine], Amlodipine, Lisinopril, and Ranexa [ranolazine]   Social History: The patient  reports that she has never smoked. She has never used smokeless tobacco. She reports that she does not drink alcohol or use drugs.   Family History: The patient's family history is not on file.   ROS:  Please see the history of present illness. Otherwise, complete review of systems is positive for none.  All other systems are reviewed and negative.   Physical Exam: VS:  BP (!) 118/115 Comment: has taken meds this morning  Pulse 63   Ht 5\' 3"  (1.6 m)   Wt 191 lb 9.6 oz (86.9 kg)   SpO2 96%   BMI 33.94 kg/m , BMI Body mass index is 33.94 kg/m.  Wt Readings from Last 3 Encounters:  07/13/19 191 lb 9.6 oz (86.9 kg)  05/22/19 172 lb (78 kg)  11/08/18 170 lb (77.1 kg)    General: Patient appears comfortable at rest. Neck: Supple, no elevated JVP or carotid bruits, no thyromegaly. Lungs: Clear to auscultation, nonlabored breathing at rest. Cardiac: Regular rate and rhythm, no S3 or significant systolic murmur, no pericardial rub. Extremities: No pitting edema, distal pulses 2+. Skin: Warm and dry. Musculoskeletal: No kyphosis. Neuropsychiatric: Alert and oriented x3, affect grossly appropriate.  ECG:  An ECG dated 07/13/2019 was personally reviewed today and demonstrated:  Sinus rhythm rate of 61, possible left atrial enlargement, anteroseptal infarct, age undetermined.  Recent Labwork: No results found for requested labs within last 8760 hours.     Component Value Date/Time   CHOL 170 06/27/2019 1026   TRIG 58 06/27/2019 1026   HDL 55 06/27/2019 1026   CHOLHDL 3.1 06/27/2019 1026    VLDL 12 06/27/2019 1026   LDLCALC 103 (H) 06/27/2019 1026    Other Studies Reviewed Today:   Assessment and Plan:  1. Essential hypertension   2. CAD in native artery   3. Hyperlipidemia LDL goal <70    1. Essential hypertension Blood pressure elevated on arrival with recent complaints of recurrent headache.  She is taking Toprol 50 mg daily.  She was taking chlorthalidone but is not compliant with daily therapy.  Blood pressure significantly elevated at recheck 190/105.  Start hydralazine 25 mg 4 times daily.  Restart chlorthalidone 25 mg daily.  Check your blood pressure daily after starting new medication.  Come back in 2 weeks for nurse visit and bring log of blood pressures with you.  We may need to adjust the dosage depending on blood pressure readings.  She denies leg  2. CAD in native artery Denies any progressive anginal or exertional symptoms.  Continue sublingual nitroglycerin as needed for chest pain.  3. Hyperlipidemia LDL goal <70  Not tolerant of statin medications.  Dr. Bronson Ing ordered that she start an injectable PCSK9.  Patient wants to defer starting injectable for now.  Medication Adjustments/Labs and Tests Ordered: Current medicines are reviewed at length with the patient today.  Concerns regarding medicines are outlined above.   Disposition: Follow-up with the [facility] or APP 1 month  Signed, Levell July, NP 07/13/2019 12:21 PM    [facility] at [place], [place], [place] [address] Phone: [phone]; Fax: [phone]

## 2019-07-13 NOTE — Patient Instructions (Addendum)
Medication Instructions:   Begin Hydralazine 25mg  four times per day.  Resume Chlorthalidone at 25mg  daily.   Continue all other medications.    Labwork: none  Testing/Procedures: none  Follow-Up: 1 month   Any Other Special Instructions Will Be Listed Below (If Applicable).  2 week nurse blood pressure check - bring your monitor for comparison.  Your physician has requested that you regularly monitor and record your blood pressure readings at home. Please take reading 3-4 x week & bring log to nurse visit for provider review.   If you need a refill on your cardiac medications before your next appointment, please call your pharmacy.

## 2019-07-27 ENCOUNTER — Ambulatory Visit: Payer: Medicare HMO

## 2019-08-01 ENCOUNTER — Ambulatory Visit: Payer: Medicare HMO

## 2019-08-16 NOTE — Progress Notes (Signed)
Cardiology Office Note  Date: 08/17/2019   ID: [name] [name], [name] [name] 1945-07-11, MRN [id number]  PCP:  Rosita Fire, MD  Cardiologist:  Kate Sable, MD Electrophysiologist:  None   Chief Complaint: Follow-up CAD  History of Present Illness: [name] [name] is a 74 y.o. female with a history of CAD, NSTEMI, HTN, HLD, DM2 and OSA  August 2015 chest pain with NSTEMI.  Cardiac cath showed distally occluded OM1 60 to 80%.  It was treated medically at that time and she was discharged home.  She is admitted with chest pain a few days later and repeat catheterization showed some total occlusion of medium caliber were highly tortuous OM 1 worse from prior study.  Described as unfavorable for PCI though if refractory angina PCI could be considered per the notes.  09/2013 echo EF 55 to 60%.  Had a low risk stress test August 2017.  Previously intolerant of Ranexa, Imdur, amlodipine, lisinopril.  Intolerant of Crestor and pravastatin.  Last LDL 107 on 2018-06-28  Last saw Dr. Bronson Ing 2019-05-22 via telemedicine.  Her blood pressure was mildly elevated.  Her coronary artery disease was stable.  He stated she may need PCSK9 inhibitors in the future due to intolerance to statins.  She presented last visit with complaints of tingling in her right foot with complaints of recurrent headaches.  Also complaining of recent sinus issues which she believed may be contributing to her headache.  Her initial blood pressure on arrival was 118/115.  Recheck manually was 190/105.  She is supposed to be taking chlorthalidone 25 mg daily.  She is not taking as directed.  States she took 1 chlorthalidone the other day due to feeling a headache and feeling like she was filling up with fluid.  Denied any other anginal or exertional symptoms, stroke or TIA-like symptoms, orthostatic symptoms, palpitations or arrhythmias, claudication-like symptoms, or lower extremity edema.  She presents today for follow-up after  starting hydralazine 25 mg 4 times daily for hypertension.  She states the hydralazine is causing her to have significant headaches and she has to lay down for hours due to the headaches.  Blood pressure has improved some.  Blood pressure today 140/90.  She continues to take her chlorthalidone 25 mg daily.  She denies any anginal or exertional issues, palpitations or arrhythmias, orthostatic symptoms, CVA or TIA-like symptoms, bleeding issues, claudication, DVT or PE symptoms, or lower extremity edema.   Past Medical History:  Diagnosis Date   Anxiety    Heart attack [facility])    History of echocardiogram    a. 2D ECHO: 09/21/2013; EF 55-60%; mild concentric hypertrophy, G1DD, mild LA dilation, PA pressure 37. Small RV pericardial effusion   Hypertension    Hypothyroidism    NSTEMI (non-ST elevated myocardial infarction) ([place])    a.  Related to occlusion of the distal Cx. The mid to distal obtuse marginal #1 also contains a significant stenosis that is best treated with medical therapy. Patent RCA/LAD with tortuosity. LM patent. EF 55%.    Sleep apnea    Thyroid disease     Past Surgical History:  Procedure Laterality Date   ABDOMINAL HYSTERECTOMY     CARDIAC CATHETERIZATION  ~ 2000   CORONARY ANGIOPLASTY  09/20/2013   LEFT HEART CATHETERIZATION WITH CORONARY ANGIOGRAM N/A 09/20/2013   Procedure: LEFT HEART CATHETERIZATION WITH CORONARY ANGIOGRAM;  Surgeon: Sinclair Grooms, MD;  Location: [facility] CATH LAB;  Service: Cardiovascular;  Laterality: N/A;   LEFT HEART CATHETERIZATION WITH  CORONARY ANGIOGRAM N/A 09/27/2013   Procedure: LEFT HEART CATHETERIZATION WITH CORONARY ANGIOGRAM;  Surgeon: Blane Ohara, MD;  Location: [facility] CATH LAB;  Service: Cardiovascular;  Laterality: N/A;   THYROIDECTOMY  10/08/2011   Procedure: THYROIDECTOMY;  Surgeon: [name], MD;  Location: [facility] ORS;  Service: General;  Laterality: N/A;  Total   VAGINAL HYSTERECTOMY  1993    Current Outpatient  Medications  Medication Sig Dispense Refill   chlorthalidone (HYGROTON) 25 MG tablet Take 1 tablet (25 mg total) by mouth daily. 30 tablet 6   levothyroxine (SYNTHROID, LEVOTHROID) 125 MCG tablet Take 125 mcg by mouth daily.      metoprolol succinate (TOPROL-XL) 100 MG 24 hr tablet Take 50 mg by mouth daily.      MISC NATURAL PRODUCTS PO Take 2 tablets by mouth 2 (two) times daily. Imflama Free with Turmeric     Multiple Vitamins-Minerals (MULTIVITAMINS THER. W/MINERALS) TABS tablet Take 1 tablet by mouth daily.     nitroGLYCERIN (NITROSTAT) 0.4 MG SL tablet DISSOLVE ONE TABLET UNDER TONGUE EVERY FIVE MINUTES AS NEEDED FOR CHEST PAIN 25 tablet 3   hydrALAZINE (APRESOLINE) 25 MG tablet Take 1 tablet (25 mg total) by mouth 4 (four) times daily. (Patient not taking: Reported on 08/17/2019) 120 tablet 6   No current facility-administered medications for this visit.   Allergies:  Fluviral [flu virus vaccine], Amlodipine, Lisinopril, and Ranexa [ranolazine]   Social History: The patient  reports that she has never smoked. She has never used smokeless tobacco. She reports that she does not drink alcohol and does not use drugs.   Family History: The patient's family history is not on file.   ROS:  Please see the history of present illness. Otherwise, complete review of systems is positive for none.  All other systems are reviewed and negative.   Physical Exam: VS:  BP 140/90    Pulse 70    Ht 5\' 3"  (1.6 m)    Wt 190 lb (86.2 kg)    SpO2 96%    BMI 33.66 kg/m , BMI Body mass index is 33.66 kg/m.  Wt Readings from Last 3 Encounters:  08/17/19 190 lb (86.2 kg)  07/13/19 191 lb 9.6 oz (86.9 kg)  05/22/19 172 lb (78 kg)    General: Patient appears comfortable at rest. Neck: Supple, no elevated JVP or carotid bruits, no thyromegaly. Lungs: Clear to auscultation, nonlabored breathing at rest. Cardiac: Regular rate and rhythm, no S3 or significant systolic murmur, no pericardial  rub. Extremities: No pitting edema, distal pulses 2+. Skin: Warm and dry. Musculoskeletal: No kyphosis. Neuropsychiatric: Alert and oriented x3, affect grossly appropriate.  ECG:  An ECG dated 07/13/2019 was personally reviewed today and demonstrated:  Sinus rhythm rate of 61, possible left atrial enlargement, anteroseptal infarct, age undetermined.  Recent Labwork: No results found for requested labs within last 8760 hours.     Component Value Date/Time   CHOL 170 06/27/2019 1026   TRIG 58 06/27/2019 1026   HDL 55 06/27/2019 1026   CHOLHDL 3.1 06/27/2019 1026   VLDL 12 06/27/2019 1026   LDLCALC 103 (H) 06/27/2019 1026    Other Studies Reviewed Today:  Lower arterial duplex 02/23/2018  FINDINGS: Right ABI:  1.06  Left ABI:  1.08  Right Lower Extremity:  Normal arterial waveforms at the ankle.  Left Lower Extremity:  Normal arterial waveforms at the ankle.  IMPRESSION: No evidence of hemodynamically significant lower extremity arterial occlusive disease at rest. Assessment and Plan:  1. Essential hypertension Patient's blood pressure is better today than previous visit.  Today's blood pressure 140/90.  After starting hydralazine 25 mg 4 times daily.  Patient states she is not tolerating the hydralazine very well it is giving her a headache and she has to lay down for hours to help resolve the headache.  Discontinue hydralazine.  Increase Toprol to 100 mg daily.  Continue chlorthalidone 25 mg daily.  Refer her to the hypertension clinic with Dr. Oval Linsey for management.  2. CAD in native artery Denies any progressive anginal or exertional symptoms.  Continue sublingual nitroglycerin as needed for chest pain.  3. Hyperlipidemia LDL goal <70 Not tolerant of statin medications.  Dr. Bronson Ing ordered that she start an injectable PCSK9.  Patient wants to defer starting injectable for now.   Medication Adjustments/Labs and Tests Ordered: Current medicines are reviewed at  length with the patient today.  Concerns regarding medicines are outlined above.   Disposition: Follow-up with the [facility] or APP 6 months  Signed, Levell July, NP 08/17/2019 11:53 AM    [facility] [place], [place], [place] [address] Phone: [phone]; Fax: [phone]

## 2019-08-17 ENCOUNTER — Encounter: Payer: Self-pay | Admitting: Family Medicine

## 2019-08-17 ENCOUNTER — Ambulatory Visit (INDEPENDENT_AMBULATORY_CARE_PROVIDER_SITE_OTHER): Payer: Medicare HMO | Admitting: Family Medicine

## 2019-08-17 ENCOUNTER — Other Ambulatory Visit: Payer: Self-pay

## 2019-08-17 VITALS — BP 140/90 | HR 70 | Ht 63.0 in | Wt 190.0 lb

## 2019-08-17 DIAGNOSIS — I10 Essential (primary) hypertension: Secondary | ICD-10-CM

## 2019-08-17 MED ORDER — METOPROLOL SUCCINATE ER 100 MG PO TB24: 100.0000 mg | ORAL_TABLET | Freq: Every day | ORAL | 6 refills | Status: DC

## 2019-08-17 NOTE — Patient Instructions (Signed)
Medication Instructions:   Stop Hydralazine.   Increase Toprol XL to 100mg  daily.   Continue all other medications.    Labwork: none  Testing/Procedures: none  Follow-Up: 6 months   Any Other Special Instructions Will Be Listed Below (If Applicable). You have been referred to:  Hypertension Clinic - Dr. Skeet Latch  If you need a refill on your cardiac medications before your next appointment, please call your pharmacy.

## 2019-08-20 DIAGNOSIS — H524 Presbyopia: Secondary | ICD-10-CM | POA: Diagnosis not present

## 2019-08-22 ENCOUNTER — Encounter: Payer: Self-pay | Admitting: *Deleted

## 2019-09-04 ENCOUNTER — Other Ambulatory Visit (HOSPITAL_COMMUNITY)
Admission: RE | Admit: 2019-09-04 | Discharge: 2019-09-04 | Disposition: A | Discharge status: Home / Self Care | Payer: Medicare HMO | Source: Ambulatory Visit | Attending: Internal Medicine | Admitting: Internal Medicine

## 2019-09-04 DIAGNOSIS — Z1389 Encounter for screening for other disorder: Secondary | ICD-10-CM | POA: Diagnosis not present

## 2019-09-04 DIAGNOSIS — Z0001 Encounter for general adult medical examination with abnormal findings: Secondary | ICD-10-CM | POA: Diagnosis not present

## 2019-09-04 DIAGNOSIS — I2510 Atherosclerotic heart disease of native coronary artery without angina pectoris: Secondary | ICD-10-CM | POA: Diagnosis not present

## 2019-09-04 DIAGNOSIS — Z6832 Body mass index (BMI) 32.0-32.9, adult: Secondary | ICD-10-CM | POA: Diagnosis not present

## 2019-09-04 DIAGNOSIS — I10 Essential (primary) hypertension: Secondary | ICD-10-CM | POA: Diagnosis not present

## 2019-09-04 DIAGNOSIS — E039 Hypothyroidism, unspecified: Secondary | ICD-10-CM | POA: Diagnosis not present

## 2019-09-04 DIAGNOSIS — Z1331 Encounter for screening for depression: Secondary | ICD-10-CM | POA: Diagnosis not present

## 2019-09-04 DIAGNOSIS — N390 Urinary tract infection, site not specified: Secondary | ICD-10-CM | POA: Insufficient documentation

## 2019-09-04 LAB — CBC WITH DIFFERENTIAL/PLATELET
Abs Immature Granulocytes: 0.02 10*3/uL (ref 0.00–0.07)
Basophils Absolute: 0 10*3/uL (ref 0.0–0.1)
Basophils Relative: 0 %
Eosinophils Absolute: 0.3 10*3/uL (ref 0.0–0.5)
Eosinophils Relative: 6 %
HCT: 51.9 % — ABNORMAL HIGH (ref 36.0–46.0)
Hemoglobin: 16.4 g/dL — ABNORMAL HIGH (ref 12.0–15.0)
Immature Granulocytes: 0 %
Lymphocytes Relative: 30 %
Lymphs Abs: 1.7 10*3/uL (ref 0.7–4.0)
MCH: 29.6 pg (ref 26.0–34.0)
MCHC: 31.6 g/dL (ref 30.0–36.0)
MCV: 93.7 fL (ref 80.0–100.0)
Monocytes Absolute: 0.3 10*3/uL (ref 0.1–1.0)
Monocytes Relative: 5 %
Neutro Abs: 3.3 10*3/uL (ref 1.7–7.7)
Neutrophils Relative %: 59 %
Platelets: 501 10*3/uL — ABNORMAL HIGH (ref 150–400)
RBC: 5.54 MIL/uL — ABNORMAL HIGH (ref 3.87–5.11)
RDW: 16.2 % — ABNORMAL HIGH (ref 11.5–15.5)
WBC: 5.6 10*3/uL (ref 4.0–10.5)
nRBC: 0 % (ref 0.0–0.2)

## 2019-09-04 LAB — HEPATIC FUNCTION PANEL
ALT: 23 U/L (ref 0–44)
AST: 27 U/L (ref 15–41)
Albumin: 4.2 g/dL (ref 3.5–5.0)
Alkaline Phosphatase: 51 U/L (ref 38–126)
Bilirubin, Direct: 0.1 mg/dL (ref 0.0–0.2)
Indirect Bilirubin: 0.4 mg/dL (ref 0.3–0.9)
Total Bilirubin: 0.5 mg/dL (ref 0.3–1.2)
Total Protein: 7.7 g/dL (ref 6.5–8.1)

## 2019-09-04 LAB — URINALYSIS, ROUTINE W REFLEX MICROSCOPIC
Bilirubin Urine: NEGATIVE
Glucose, UA: NEGATIVE mg/dL
Hgb urine dipstick: NEGATIVE
Ketones, ur: NEGATIVE mg/dL
Nitrite: POSITIVE — AB
Protein, ur: NEGATIVE mg/dL
Specific Gravity, Urine: 1.01 (ref 1.005–1.030)
pH: 7 (ref 5.0–8.0)

## 2019-09-04 LAB — URINALYSIS, MICROSCOPIC (REFLEX): WBC, UA: >50 WBC/hpf (ref 0–5)

## 2019-09-04 LAB — BASIC METABOLIC PANEL
Anion gap: 8 (ref 5–15)
BUN: 17 mg/dL (ref 8–23)
CO2: 30 mmol/L (ref 22–32)
Calcium: 9.1 mg/dL (ref 8.9–10.3)
Chloride: 102 mmol/L (ref 98–111)
Creatinine, Ser: 1.09 mg/dL — ABNORMAL HIGH (ref 0.44–1.00)
GFR calc Af Amer: 58 mL/min — ABNORMAL LOW (ref >60)
GFR calc non Af Amer: 50 mL/min — ABNORMAL LOW (ref >60)
Glucose, Bld: 90 mg/dL (ref 70–99)
Potassium: 3.6 mmol/L (ref 3.5–5.1)
Sodium: 140 mmol/L (ref 135–145)

## 2019-09-04 LAB — LIPID PANEL
Cholesterol: 194 mg/dL (ref 0–200)
HDL: 54 mg/dL (ref >40)
LDL Cholesterol: 127 mg/dL — ABNORMAL HIGH (ref 0–99)
Total CHOL/HDL Ratio: 3.6 RATIO
Triglycerides: 63 mg/dL (ref <150)
VLDL: 13 mg/dL (ref 0–40)

## 2019-09-04 LAB — TSH: TSH: 4.301 u[IU]/mL (ref 0.350–4.500)

## 2019-09-06 LAB — T4: T4, Total: 5.4 ug/dL (ref 4.5–12.0)

## 2019-09-07 LAB — T4: T4, Total: 5.9 ug/dL (ref 4.5–12.0)

## 2019-09-09 DIAGNOSIS — E039 Hypothyroidism, unspecified: Secondary | ICD-10-CM | POA: Diagnosis not present

## 2019-09-09 DIAGNOSIS — I2510 Atherosclerotic heart disease of native coronary artery without angina pectoris: Secondary | ICD-10-CM | POA: Diagnosis not present

## 2019-09-27 ENCOUNTER — Ambulatory Visit: Payer: Medicare HMO | Admitting: Cardiovascular Disease

## 2019-10-10 DIAGNOSIS — I10 Essential (primary) hypertension: Secondary | ICD-10-CM | POA: Diagnosis not present

## 2019-10-10 DIAGNOSIS — I2510 Atherosclerotic heart disease of native coronary artery without angina pectoris: Secondary | ICD-10-CM | POA: Diagnosis not present

## 2019-10-16 DIAGNOSIS — E039 Hypothyroidism, unspecified: Secondary | ICD-10-CM | POA: Diagnosis not present

## 2019-10-16 DIAGNOSIS — R519 Headache, unspecified: Secondary | ICD-10-CM | POA: Diagnosis not present

## 2019-10-16 DIAGNOSIS — R9431 Abnormal electrocardiogram [ECG] [EKG]: Secondary | ICD-10-CM | POA: Diagnosis not present

## 2019-10-16 DIAGNOSIS — R072 Precordial pain: Secondary | ICD-10-CM | POA: Diagnosis not present

## 2019-10-16 DIAGNOSIS — R079 Chest pain, unspecified: Secondary | ICD-10-CM | POA: Diagnosis not present

## 2019-10-16 DIAGNOSIS — J9811 Atelectasis: Secondary | ICD-10-CM | POA: Diagnosis not present

## 2019-10-16 DIAGNOSIS — I252 Old myocardial infarction: Secondary | ICD-10-CM | POA: Diagnosis not present

## 2019-11-01 ENCOUNTER — Ambulatory Visit: Payer: Medicare HMO | Admitting: Cardiovascular Disease

## 2019-11-01 NOTE — Progress Notes (Deleted)
Hypertension Clinic Initial Assessment:    Date:  11/01/2019   ID:  [name] [name], [name] [name] 04/12/1945, MRN [id number]  PCP:  Rosita Fire, MD  Cardiologist:  Kate Sable, MD (Inactive)  Nephrologist:  Referring MD: Verta Ellen., NP   CC: Hypertension  History of Present Illness:    [name] [name] is a 74 y.o. female with a hx of hypertension, hyperlipidemia, coronary artery disease status post NSTEMI, OSA, hypothyroidism, and anxiety here to establish care in the hypertension clinic.   She was seen 09/2013 with NSTEMI.  Cath at that time revealed an occluded distal OM1 that was medically managed.  She saw Dr. Bronson Ing via telehealth and her blood pressure was 140/64.  She subsequently saw Katina Dung, NP 07/2019 and noted recurrent headaches.  She also reported that she was not taking chlorthalidone daily.  Her blood pressure on recheck was 190/105.  She was started on hydralazine 25 mg 4 times a day and chlorthalidone was restarted.  She stopped taking the hydralazine due to headaches.  She saw Katina Dung, NP on 08/2019 and metoprolol was increased.  She was referred to the [facility] hypertension clinic   Previous antihypertensives: Amlodipine Imrur Lisinopril Headache  Secondary Causes of Hypertension  Medications/Herbal: OCP, steroids, stimulants, antidepressants, weight loss medication, immune suppressants, NSAIDs, sympathomimetics, alcohol, caffeine, licorice, ginseng, St. John's wort, chemo  Sleep Apnea Renal artery stenosis Hyperaldosteronism Hyper/hypothyroidism Pheochromocytoma: palpitations, tachycardia, headache, diaphoresis (plasma metanephrines) Cushing's syndrome: Cushingoid facies, central obesity, proximal muscle weakness, and ecchymoses, adrenal incidentaloma (cortisol) Coarctation of the aorta  Past Medical History:  Diagnosis Date  . Anxiety   . Heart attack ([place])   . History of echocardiogram    a. 2D ECHO: 09/21/2013; EF 55-60%; mild  concentric hypertrophy, G1DD, mild LA dilation, PA pressure 37. Small RV pericardial effusion  . Hypertension   . Hypothyroidism   . NSTEMI (non-ST elevated myocardial infarction) ([place])    a.  Related to occlusion of the distal Cx. The mid to distal obtuse marginal #1 also contains a significant stenosis that is best treated with medical therapy. Patent RCA/LAD with tortuosity. LM patent. EF 55%.   . Sleep apnea   . Thyroid disease     Past Surgical History:  Procedure Laterality Date  . ABDOMINAL HYSTERECTOMY    . CARDIAC CATHETERIZATION  ~ 2000  . CORONARY ANGIOPLASTY  09/20/2013  . LEFT HEART CATHETERIZATION WITH CORONARY ANGIOGRAM N/A 09/20/2013   Procedure: LEFT HEART CATHETERIZATION WITH CORONARY ANGIOGRAM;  Surgeon: Sinclair Grooms, MD;  Location: [facility] CATH LAB;  Service: Cardiovascular;  Laterality: N/A;  . LEFT HEART CATHETERIZATION WITH CORONARY ANGIOGRAM N/A 09/27/2013   Procedure: LEFT HEART CATHETERIZATION WITH CORONARY ANGIOGRAM;  Surgeon: Blane Ohara, MD;  Location: [facility] CATH LAB;  Service: Cardiovascular;  Laterality: N/A;  . THYROIDECTOMY  10/08/2011   Procedure: THYROIDECTOMY;  Surgeon: [name], MD;  Location: [facility] ORS;  Service: General;  Laterality: N/A;  Total  . VAGINAL HYSTERECTOMY  1993    Current Medications: No outpatient medications have been marked as taking for the 11/01/19 encounter (Appointment) with Skeet Latch, MD.     Allergies:   Fluviral [flu virus vaccine], Amlodipine, Lisinopril, and Ranexa [ranolazine]   Social History   Socioeconomic History  . Marital status: Widowed    Spouse name: Not on file  . Number of children: Not on file  . Years of education: Not on file  . Highest education level: Not on file  Occupational  History  . Not on file  Tobacco Use  . Smoking status: Never Smoker  . Smokeless tobacco: Never Used  Vaping Use  . Vaping Use: Never used  Substance and Sexual Activity  . Alcohol use: No    Alcohol/week: 0.0  standard drinks  . Drug use: No  . Sexual activity: Never  Other Topics Concern  . Not on file  Social History Narrative   ** Merged History Encounter **  Lives at home with family.  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      Social Determinants of Health   Financial Resource Strain:   . Difficulty of Paying Living Expenses: Not on file  Food Insecurity:   . Worried About Charity fundraiser in the Last Year: Not on file  . Ran Out of Food in the Last Year: Not on file  Transportation Needs:   . Lack of Transportation (Medical): Not on file  . Lack of Transportation (Non-Medical): Not on file  Physical Activity:   . Days of Exercise per Week: Not on file  . Minutes of Exercise per Session: Not on file  Stress:   . Feeling of Stress : Not on file  Social Connections:   . Frequency of Communication with Friends and Family: Not on file  . Frequency of Social Gatherings with Friends and Family: Not on file  . Attends Religious Services: Not on file  . Active Member of Clubs or Organizations: Not on file  . Attends Archivist Meetings: Not on file  . Marital Status: Not on file     Family History: The patient's ***family history is not on file.  ROS:   Please see the history of present illness.    *** All other systems reviewed and are negative.  EKGs/Labs/Other Studies Reviewed:    EKG:  EKG is *** ordered today.  The ekg ordered today demonstrates ***  Recent Labs: 09/04/2019: ALT 23; BUN 17; Creatinine, Ser 1.09; Hemoglobin 16.4; Platelets 501; Potassium 3.6; Sodium 140; TSH 4.301   Recent Lipid Panel    Component Value Date/Time   CHOL 194 09/04/2019 1216   TRIG 63 09/04/2019 1216   HDL 54 09/04/2019 1216   CHOLHDL 3.6 09/04/2019 1216   VLDL 13 09/04/2019 1216   LDLCALC 127 (H) 09/04/2019 1216   Echo 09/2013: Study Conclusions   - Left ventricle: The cavity size was normal. There was mild  concentric hypertrophy. Systolic function was normal. The    estimated ejection fraction was in the range of 55% to 60%. Wall  motion was normal; there were no regional wall motion  abnormalities. Doppler parameters are consistent with abnormal  left ventricular relaxation (grade 1 diastolic dysfunction).  - Left atrium: The atrium was mildly dilated.  - Pulmonary arteries: Systolic pressure was mildly increased. PA  peak pressure: 37 mm Hg (S).  - Pericardium, extracardiac: A small pericardial effusion was  identified along the right ventricular free wall.    Physical Exam:    VS:  There were no vitals taken for this visit.    Wt Readings from Last 3 Encounters:  08/17/19 190 lb (86.2 kg)  07/13/19 191 lb 9.6 oz (86.9 kg)  05/22/19 172 lb (78 kg)     GEN: *** Well nourished, well developed in no acute distress HEENT: Normal NECK: No JVD; No carotid bruits LYMPHATICS: No lymphadenopathy CARDIAC: ***RRR, no murmurs, rubs, gallops RESPIRATORY:  Clear to auscultation without rales, wheezing or rhonchi  ABDOMEN: Soft, non-tender, non-distended  MUSCULOSKELETAL:  No edema; No deformity  SKIN: Warm and dry NEUROLOGIC:  Alert and oriented x 3 PSYCHIATRIC:  Normal affect   ASSESSMENT:    No diagnosis found.  PLAN:    1. ***  she consents to be monitored in our remote patient monitoring program through [facility].  she will track his blood pressure twice daily and understands that these trends will help [place] to adjust her medications as needed prior to his next appointment.  she *** interested in enrolling in the PREP exercise and nutrition program through the [facility].     Disposition:    FU with MD/PharmD in {gen number [id number]} {Days to years:10300} {virtual or in-office}   Medication Adjustments/Labs and Tests Ordered: Current medicines are reviewed at length with the patient today.  Concerns regarding medicines are outlined above.  No orders of the defined types were placed in this encounter.  No orders of the defined  types were placed in this encounter.    Signed, Skeet Latch, MD  11/01/2019 9:16 AM    [place]

## 2019-11-06 ENCOUNTER — Encounter: Payer: Self-pay | Admitting: General Practice

## 2019-11-07 ENCOUNTER — Ambulatory Visit: Payer: Medicare HMO | Admitting: Cardiovascular Disease

## 2019-11-07 NOTE — Progress Notes (Signed)
Cardiology Office Note  Date: 11/08/2019   ID: Reka, Wist 05-04-1945, MRN [id number]  PCP:  Rosita Fire, MD  Cardiologist:  No primary care provider on file. Electrophysiologist:  None   Chief Complaint: Follow-up CAD  History of Present Illness: [name] [name] is a 74 y.o. female with a history of CAD, NSTEMI, HTN, HLD, DM2 and OSA  August 2015 chest pain with NSTEMI.  Cardiac cath showed distally occluded OM1 60 to 80%.  It was treated medically at that time and she was discharged home.  She is admitted with chest pain a few days later and repeat catheterization showed some total occlusion of medium caliber were highly tortuous OM 1 worse from prior study.  Described as unfavorable for PCI though if refractory angina PCI could be considered per the notes.  09/2013 echo EF 55 to 60%.  Had a low risk stress test August 2017.  Previously intolerant of Ranexa, Imdur, amlodipine, lisinopril.  Intolerant of Crestor and pravastatin.  Last LDL 107 on 2018-06-28  Last saw Dr. Bronson Ing 2019-05-22 via telemedicine.  Her blood pressure was mildly elevated.  Her coronary artery disease was stable.  He stated she may need PCSK9 inhibitors in the future due to intolerance to statins.  She presented at a previous visit with complaints of tingling in her right foot with complaints of recurrent headaches.  Also complaining of recent sinus issues which she believed may be contributing to her headache.  Her initial blood pressure on arrival was 118/115.  Recheck manually was 190/105.  She was supposed to be taking chlorthalidone 25 mg daily.  She was not taking as directed.  Stated she took 1 chlorthalidone the other day due to feeling a headache and feeling like she was filling up with fluid.  Denied any other anginal or exertional symptoms, stroke or TIA-like symptoms, orthostatic symptoms, palpitations or arrhythmias, claudication-like symptoms, or lower extremity edema.  Presented last  visit for for follow-up after starting hydralazine 25 mg 4 times daily for hypertension.  She stated the hydralazine was causing her to have significant headaches and she had to lay down for hours due to the headaches.  Blood pressure had improved some.  Her hydralazine was stopped.  She continued to take her chlorthalidone 25 mg daily.  She denied any anginal or exertional issues, palpitations or arrhythmias, orthostatic symptoms, CVA or TIA-like symptoms, bleeding issues, claudication, DVT or PE symptoms, or lower extremity edema.   Past Medical History:  Diagnosis Date  . Anxiety   . Heart attack ([place])   . History of echocardiogram    a. 2D ECHO: 09/21/2013; EF 55-60%; mild concentric hypertrophy, G1DD, mild LA dilation, PA pressure 37. Small RV pericardial effusion  . Hypertension   . Hypothyroidism   . NSTEMI (non-ST elevated myocardial infarction) ([place])    a.  Related to occlusion of the distal Cx. The mid to distal obtuse marginal #1 also contains a significant stenosis that is best treated with medical therapy. Patent RCA/LAD with tortuosity. LM patent. EF 55%.   . Sleep apnea   . Thyroid disease     Past Surgical History:  Procedure Laterality Date  . ABDOMINAL HYSTERECTOMY    . CARDIAC CATHETERIZATION  ~ 2000  . CORONARY ANGIOPLASTY  09/20/2013  . LEFT HEART CATHETERIZATION WITH CORONARY ANGIOGRAM N/A 09/20/2013   Procedure: LEFT HEART CATHETERIZATION WITH CORONARY ANGIOGRAM;  Surgeon: Sinclair Grooms, MD;  Location: [facility] CATH LAB;  Service: Cardiovascular;  Laterality: N/A;  .  LEFT HEART CATHETERIZATION WITH CORONARY ANGIOGRAM N/A 09/27/2013   Procedure: LEFT HEART CATHETERIZATION WITH CORONARY ANGIOGRAM;  Surgeon: Blane Ohara, MD;  Location: [facility] CATH LAB;  Service: Cardiovascular;  Laterality: N/A;  . THYROIDECTOMY  10/08/2011   Procedure: THYROIDECTOMY;  Surgeon: [name], MD;  Location: [facility] ORS;  Service: General;  Laterality: N/A;  Total  . VAGINAL HYSTERECTOMY  1993      Current Outpatient Medications  Medication Sig Dispense Refill  . chlorthalidone (HYGROTON) 25 MG tablet Take 1 tablet (25 mg total) by mouth daily. 30 tablet 6  . levothyroxine (SYNTHROID, LEVOTHROID) 125 MCG tablet Take 125 mcg by mouth daily.     . metoprolol succinate (TOPROL-XL) 100 MG 24 hr tablet Take 1 tablet (100 mg total) by mouth daily. 30 tablet 6  . nitroGLYCERIN (NITROSTAT) 0.4 MG SL tablet DISSOLVE ONE TABLET UNDER TONGUE EVERY FIVE MINUTES AS NEEDED FOR CHEST PAIN 25 tablet 3   No current facility-administered medications for this visit.   Allergies:  Fluviral [flu virus vaccine], Amlodipine, Lisinopril, and Ranexa [ranolazine]   Social History: The patient  reports that she has never smoked. She has never used smokeless tobacco. She reports that she does not drink alcohol and does not use drugs.   Family History: The patient's family history is not on file.   ROS:  Please see the history of present illness. Otherwise, complete review of systems is positive for none.  All other systems are reviewed and negative.   Physical Exam: VS:  BP (!) 150/84   Pulse 76   Ht 5\' 3"  (1.6 m)   Wt 186 lb 3.2 oz (84.5 kg)   SpO2 98%   BMI 32.98 kg/m , BMI Body mass index is 32.98 kg/m.  Wt Readings from Last 3 Encounters:  11/08/19 186 lb 3.2 oz (84.5 kg)  08/17/19 190 lb (86.2 kg)  07/13/19 191 lb 9.6 oz (86.9 kg)    General: Patient appears comfortable at rest. Neck: Supple, no elevated JVP or carotid bruits, no thyromegaly. Lungs: Clear to auscultation, nonlabored breathing at rest. Cardiac: Regular rate and rhythm, no S3 or significant systolic murmur, no pericardial rub. Extremities: No pitting edema, distal pulses 2+. Skin: Warm and dry. Musculoskeletal: No kyphosis. Neuropsychiatric: Alert and oriented x3, affect grossly appropriate.  ECG:  An ECG dated 07/13/2019 was personally reviewed today and demonstrated:  Sinus rhythm rate of 61, possible left atrial  enlargement, anteroseptal infarct, age undetermined.  Recent Labwork: 09/04/2019: ALT 23; AST 27; BUN 17; Creatinine, Ser 1.09; Hemoglobin 16.4; Platelets 501; Potassium 3.6; Sodium 140; TSH 4.301     Component Value Date/Time   CHOL 194 09/04/2019 1216   TRIG 63 09/04/2019 1216   HDL 54 09/04/2019 1216   CHOLHDL 3.6 09/04/2019 1216   VLDL 13 09/04/2019 1216   LDLCALC 127 (H) 09/04/2019 1216    Other Studies Reviewed Today:  Lower arterial duplex 02/23/2018  FINDINGS: Right ABI:  1.06  Left ABI:  1.08  Right Lower Extremity:  Normal arterial waveforms at the ankle.  Left Lower Extremity:  Normal arterial waveforms at the ankle.  IMPRESSION: No evidence of hemodynamically significant lower extremity arterial occlusive disease at rest.  Assessment and Plan:   1. Essential hypertension  After starting hydralazine 25 mg 4 times daily, she was not tolerating the hydralazine very well it was giving her a headache and she had to lay down for hours to help resolve the headache.  Discontinued hydralazine.  Increased Toprol to  100 mg daily.  Continued chlorthalidone 25 mg daily.  Referred her to the hypertension clinic with Dr. Oval Linsey for management.  Patient states today she was unable to make the appointment with Dr. Oval Linsey due to transportation issues.  States she will reschedule.  2. CAD in native artery   Continue sublingual nitroglycerin as needed for chest pain.  Recently presented to [facility] with complaints of retrosternal sternal chest pressure.  She denied any radiation.  No aggravating or alleviating factors or associated nausea vomiting or diaphoresis.  Troponins were negative x2.  She was hypokalemic at 3.1 potassium, and hypocalcemic with 8.3 calcium, TSH was 0.185.  ER provider recommended follow-up stress test.  We will order a Cardiolite treadmill nuclear stress test.  If patient unable to reach maximum heart rate may switch to a Lexiscan stress.  She will  need to stop beta-blocker the night before.  No caffeinated drinks prior to stress test  3. Hyperlipidemia LDL goal <70 Not tolerant of statin medications.  Dr. Bronson Ing ordered that she start an injectable PCSK9.  Patient wants to defer starting injectable for now.   Medication Adjustments/Labs and Tests Ordered: Current medicines are reviewed at length with the patient today.  Concerns regarding medicines are outlined above.   Disposition: Follow-up with the [facility] or APP 3 months Signed, Levell July, NP 11/08/2019 9:56 AM    [facility] at [address], [place], [place] [address] Phone: [phone]; Fax: [phone]

## 2019-11-08 ENCOUNTER — Encounter: Payer: Self-pay | Admitting: *Deleted

## 2019-11-08 ENCOUNTER — Encounter: Payer: Self-pay | Admitting: Family Medicine

## 2019-11-08 ENCOUNTER — Telehealth: Payer: Self-pay | Admitting: Family Medicine

## 2019-11-08 ENCOUNTER — Ambulatory Visit (INDEPENDENT_AMBULATORY_CARE_PROVIDER_SITE_OTHER): Payer: Medicare HMO | Admitting: Family Medicine

## 2019-11-08 VITALS — BP 150/84 | HR 76 | Ht 63.0 in | Wt 186.2 lb

## 2019-11-08 DIAGNOSIS — T466X5A Adverse effect of antihyperlipidemic and antiarteriosclerotic drugs, initial encounter: Secondary | ICD-10-CM | POA: Diagnosis not present

## 2019-11-08 DIAGNOSIS — R079 Chest pain, unspecified: Secondary | ICD-10-CM | POA: Diagnosis not present

## 2019-11-08 DIAGNOSIS — Z789 Other specified health status: Secondary | ICD-10-CM

## 2019-11-08 DIAGNOSIS — G720 Drug-induced myopathy: Secondary | ICD-10-CM | POA: Diagnosis not present

## 2019-11-08 NOTE — Telephone Encounter (Signed)
Pre-cert Verification for the following procedure    Exercise Cardiolite   DATE:11/16/2019  LOCATION: [facility]

## 2019-11-08 NOTE — Patient Instructions (Addendum)
Medication Instructions:    Your physician recommends that you continue on your current medications as directed. Please refer to the Current Medication list given to you today.  Labwork:  None  Testing/Procedures: Your physician has requested that you have en exercise stress myoview. For further information please visit [email or web address]. Please follow instruction sheet, as given.  Follow-Up:  Your physician recommends that you schedule a follow-up appointment in: 2-3 months.  Any Other Special Instructions Will Be Listed Below (If Applicable).  If you need a refill on your cardiac medications before your next appointment, please call your pharmacy.

## 2019-11-09 DIAGNOSIS — I10 Essential (primary) hypertension: Secondary | ICD-10-CM | POA: Diagnosis not present

## 2019-11-09 DIAGNOSIS — I2510 Atherosclerotic heart disease of native coronary artery without angina pectoris: Secondary | ICD-10-CM | POA: Diagnosis not present

## 2019-11-12 ENCOUNTER — Other Ambulatory Visit: Payer: Self-pay | Admitting: Family Medicine

## 2019-11-12 MED ORDER — NITROGLYCERIN 0.4 MG SL SUBL: SUBLINGUAL_TABLET | SUBLINGUAL | 3 refills | Status: DC

## 2019-11-12 NOTE — Telephone Encounter (Signed)
Medication sent to pharmacy  

## 2019-11-12 NOTE — Telephone Encounter (Signed)
     1. Which medications need to be refilled? (please list name of each medication and dose if known) NitroGlycerin 0.4 mg   2. Which pharmacy/location (including street and city if local pharmacy) is medication to be sent to? [facility]   3. Do they need a 30 day or 90 day supply?

## 2019-11-13 ENCOUNTER — Telehealth: Payer: Self-pay | Admitting: *Deleted

## 2019-11-13 NOTE — Telephone Encounter (Signed)
-----   Message from Merlene Laughter, RN sent at 11/08/2019 12:03 PM EDT ----- Regarding: make sure she is notified about her covid test

## 2019-11-13 NOTE — Telephone Encounter (Signed)
Contacted to advised that covid test needed for exercise myoview and patient refused stating, "Well they can cancel it". Advised that exercise myoview can not be done without covid test. Patient refused again. Will notify stress lab and schedulers.

## 2019-11-14 ENCOUNTER — Other Ambulatory Visit (HOSPITAL_COMMUNITY)
Admission: RE | Admit: 2019-11-14 | Discharge: 2019-11-14 | Disposition: A | Discharge status: Home / Self Care | Payer: Medicare HMO | Source: Ambulatory Visit | Attending: Family Medicine | Admitting: Family Medicine

## 2019-11-15 ENCOUNTER — Telehealth: Payer: Self-pay | Admitting: *Deleted

## 2019-11-15 NOTE — Telephone Encounter (Signed)
Contacted to verify she is still going to have her stress test tomorrow. Patient verified that she is going to have stress test done tomorrow.

## 2019-11-16 ENCOUNTER — Encounter (HOSPITAL_COMMUNITY): Payer: Medicare HMO

## 2019-11-16 ENCOUNTER — Encounter: Payer: Self-pay | Admitting: *Deleted

## 2019-11-16 DIAGNOSIS — T466X5A Adverse effect of antihyperlipidemic and antiarteriosclerotic drugs, initial encounter: Secondary | ICD-10-CM | POA: Insufficient documentation

## 2019-11-16 DIAGNOSIS — Z789 Other specified health status: Secondary | ICD-10-CM | POA: Insufficient documentation

## 2019-11-16 DIAGNOSIS — G720 Drug-induced myopathy: Secondary | ICD-10-CM | POA: Insufficient documentation

## 2019-11-19 ENCOUNTER — Other Ambulatory Visit (HOSPITAL_COMMUNITY): Payer: Medicare HMO

## 2019-11-26 ENCOUNTER — Ambulatory Visit: Payer: Medicare HMO | Admitting: Cardiovascular Disease

## 2019-11-28 ENCOUNTER — Ambulatory Visit: Payer: Medicare HMO | Admitting: Cardiology

## 2019-12-11 DIAGNOSIS — I2510 Atherosclerotic heart disease of native coronary artery without angina pectoris: Secondary | ICD-10-CM | POA: Diagnosis not present

## 2019-12-11 DIAGNOSIS — E039 Hypothyroidism, unspecified: Secondary | ICD-10-CM | POA: Diagnosis not present

## 2019-12-25 DIAGNOSIS — R69 Illness, unspecified: Secondary | ICD-10-CM | POA: Diagnosis not present

## 2020-01-08 ENCOUNTER — Ambulatory Visit: Payer: Medicare HMO | Admitting: Family Medicine

## 2020-01-10 DIAGNOSIS — I10 Essential (primary) hypertension: Secondary | ICD-10-CM | POA: Diagnosis not present

## 2020-01-10 DIAGNOSIS — I2510 Atherosclerotic heart disease of native coronary artery without angina pectoris: Secondary | ICD-10-CM | POA: Diagnosis not present

## 2020-01-13 NOTE — Progress Notes (Signed)
Cardiology Office Note  Date: 01/14/2020   ID: [name], [name] May 13, 1945, MRN [id number]  PCP:  Rosita Fire, MD  Cardiologist:  No primary care provider on file. Electrophysiologist:  None   Chief Complaint: Follow-up CAD  History of Present Illness: [name] [name] is a 73 y.o. female with a history of CAD, NSTEMI, HTN, HLD, DM2 and OSA  August 2015 chest pain with NSTEMI.  Cardiac cath showed distally occluded OM1 60 to 80%.  It was treated medically at that time and she was discharged home.  She is admitted with chest pain a few days later and repeat catheterization showed some total occlusion of medium caliber were highly tortuous OM 1 worse from prior study.  Described as unfavorable for PCI though if refractory angina PCI could be considered per the notes.  09/2013 echo EF 55 to 60%.  Had a low risk stress test August 2017.  Previously intolerant of Ranexa, Imdur, amlodipine, lisinopril.  Intolerant of Crestor and pravastatin.  Last LDL 107 on 2018-06-28  Last saw Dr. Bronson Ing 2019-05-22 via telemedicine.  Her blood pressure was mildly elevated.  Her coronary artery disease was stable.  He stated she may need PCSK9 inhibitors in the future due to intolerance to statins.  She presented at a previous visit with complaints of tingling in her right foot with complaints of recurrent headaches.  Also complaining of recent sinus issues which she believed may be contributing to her headache.  Her initial blood pressure on arrival was 118/115.  Recheck manually was 190/105.  She was supposed to be taking chlorthalidone 25 mg daily.  She was not taking as directed.  Stated she took 1 chlorthalidone the other day due to feeling a headache and feeling like she was filling up with fluid.  Denied any other anginal or exertional symptoms, stroke or TIA-like symptoms, orthostatic symptoms, palpitations or arrhythmias, claudication-like symptoms, or lower extremity edema.  Presented last  visit for for follow-up after starting hydralazine 25 mg 4 times daily for hypertension.  She stated the hydralazine was causing her to have significant headaches and she had to lay down for hours due to the headaches.  Blood pressure had improved some.  Her hydralazine was stopped.  She continued to take her chlorthalidone 25 mg daily.  She denied any anginal or exertional issues, palpitations or arrhythmias, orthostatic symptoms, CVA or TIA-like symptoms, bleeding issues, claudication, DVT or PE symptoms, or lower extremity edema.  She presents today for follow-up.  Blood pressure significantly elevated today.  She was scheduled to follow-up with hypertension clinic.  She missed 2 appointments and canceled the other.  She states she had deaths in the family and was unable to go to these appointments.  She was scheduled for stress test but refused to Covid test to allow her to undergo the stress test.  She currently denies any anginal or exertional symptoms, orthostatic symptoms, CVA or TIA-like symptoms, palpitations or arrhythmias.  Denies any PND, orthopnea, claudication-like symptoms, DVT or PE-like symptoms, or lower extremity edema.   Past Medical History:  Diagnosis Date  . Anxiety   . Heart attack ([place])   . History of echocardiogram    a. 2D ECHO: 09/21/2013; EF 55-60%; mild concentric hypertrophy, G1DD, mild LA dilation, PA pressure 37. Small RV pericardial effusion  . Hypertension   . Hypothyroidism   . NSTEMI (non-ST elevated myocardial infarction) ([place])    a.  Related to occlusion of the distal Cx. The mid to distal  obtuse marginal #1 also contains a significant stenosis that is best treated with medical therapy. Patent RCA/LAD with tortuosity. LM patent. EF 55%.   . Sleep apnea   . Thyroid disease     Past Surgical History:  Procedure Laterality Date  . ABDOMINAL HYSTERECTOMY    . CARDIAC CATHETERIZATION  ~ 2000  . CORONARY ANGIOPLASTY  09/20/2013  . LEFT HEART CATHETERIZATION  WITH CORONARY ANGIOGRAM N/A 09/20/2013   Procedure: LEFT HEART CATHETERIZATION WITH CORONARY ANGIOGRAM;  Surgeon: [name] Grooms, MD;  Location: [facility] CATH LAB;  Service: Cardiovascular;  Laterality: N/A;  . LEFT HEART CATHETERIZATION WITH CORONARY ANGIOGRAM N/A 09/27/2013   Procedure: LEFT HEART CATHETERIZATION WITH CORONARY ANGIOGRAM;  Surgeon: Blane Ohara, MD;  Location[facility] CATH LAB;  Service: Cardiovascular;  Laterality: N/A;  . THYROIDECTOMY  10/08/2011   Procedure: THYROIDECTOMY;  Surgeon: [name], MD;  Location: [facility] ORS;  Service: General;  Laterality: N/A;  Total  . VAGINAL HYSTERECTOMY  1993    Current Outpatient Medications  Medication Sig Dispense Refill  . chlorthalidone (HYGROTON) 25 MG tablet Take 1 tablet (25 mg total) by mouth daily. 30 tablet 6  . levothyroxine (SYNTHROID, LEVOTHROID) 125 MCG tablet Take 125 mcg by mouth daily.     . metoprolol succinate (TOPROL-XL) 100 MG 24 hr tablet Take 1 tablet (100 mg total) by mouth daily. 30 tablet 6  . nitroGLYCERIN (NITROSTAT) 0.4 MG SL tablet TAKE 1 TABLET UNDER THE TONGUE EVERY 5 MINS FOR CHEST PAIN - AFTER 2 DOSES GO TO ED 25 tablet 3  . cloNIDine (CATAPRES) 0.1 MG tablet Take 1 tablet (0.1 mg total) by mouth 2 (two) times daily. 180 tablet 1   No current facility-administered medications for this visit.   Allergies:  Fluviral [flu virus vaccine], Amlodipine, Lisinopril, and Ranexa [ranolazine]   Social History: The patient  reports that she has never smoked. She has never used smokeless tobacco. She reports that she does not drink alcohol and does not use drugs.   Family History: The patient's family history is not on file.   ROS:  Please see the history of present illness. Otherwise, complete review of systems is positive for none.  All other systems are reviewed and negative.   Physical Exam: VS:  BP (!) 172/112   Pulse 69   Ht 5\' 3"  (1.6 m)   Wt 183 lb (83 kg)   SpO2 96%   BMI 32.42 kg/m , BMI Body mass  index is 32.42 kg/m.  Wt Readings from Last 3 Encounters:  01/14/20 183 lb (83 kg)  11/08/19 186 lb 3.2 oz (84.5 kg)  08/17/19 190 lb (86.2 kg)    General: Patient appears comfortable at rest. Neck: Supple, no elevated JVP or carotid bruits, no thyromegaly. Lungs: Clear to auscultation, nonlabored breathing at rest. Cardiac: Regular rate and rhythm, no S3 or significant systolic murmur, no pericardial rub. Extremities: No pitting edema, distal pulses 2+. Skin: Warm and dry. Musculoskeletal: No kyphosis. Neuropsychiatric: Alert and oriented x3, affect grossly appropriate.  ECG:  An ECG dated 07/13/2019 was personally reviewed today and demonstrated:  Sinus rhythm rate of 61, possible left atrial enlargement, anteroseptal infarct, age undetermined.  Recent Labwork: 09/04/2019: ALT 23; AST 27; BUN 17; Creatinine, Ser 1.09; Hemoglobin 16.4; Platelets 501; Potassium 3.6; Sodium 140; TSH 4.301     Component Value Date/Time   CHOL 194 09/04/2019 1216   TRIG 63 09/04/2019 1216   HDL 54 09/04/2019 1216   CHOLHDL 3.6 09/04/2019  1216   VLDL 13 09/04/2019 1216   LDLCALC 127 (H) 09/04/2019 1216    Other Studies Reviewed Today:  Lower arterial duplex 02/23/2018  FINDINGS: Right ABI:  1.06  Left ABI:  1.08  Right Lower Extremity:  Normal arterial waveforms at the ankle.  Left Lower Extremity:  Normal arterial waveforms at the ankle.  IMPRESSION: No evidence of hemodynamically significant lower extremity arterial occlusive disease at rest.  Assessment and Plan:   1. Essential hypertension  After starting hydralazine 25 mg 4 times daily, she was not tolerating the hydralazine very well it was giving her a headache and she had to lay down for hours to help resolve the headache.  Discontinued hydralazine.  Increased Toprol to 100 mg daily.  Continued chlorthalidone 25 mg daily.  Referred her to the hypertension clinic with Dr. Oval Linsey for management.  Apparently she was a no-show x2  to see Dr. Oval Linsey that hypertension clinic and she canceled 1 visit.  She states today she had some deaths in the family which was the reason she was unable to follow-up.  We will reschedule her to see Dr. Oval Linsey at hypertension clinic.  Add clonidine 0.1 mg p.o. twice daily to current antihypertensive regimen.  Continue chlorthalidone 25 mg p.o. daily.  Continue Toprol-XL 100 mg daily.  2. CAD in native artery Previously presented to [facility] with complaints of retrosternal sternal chest pressure.  She denied any radiation.  No aggravating or alleviating factors or associated nausea vomiting or diaphoresis.  Troponins were negative x2.  She was hypokalemic at 3.1 potassium, and hypocalcemic with 8.3 calcium, TSH was 0.185.  ER provider recommended follow-up stress test.  We ordered a Cardiolite treadmill stress nuclear test at last visit.  Patient needed to undergo a Covid test in order to undergo the stress test.  She refused to have the test.  Therefore she never underwent the continue nitroglycerin sublingual as needed.  Stress test.  Patient continues to refuse today to have Covid test.  Currently denies any retrosternal chest discomfort.  3. Hyperlipidemia LDL goal <70 Not tolerant of statin medications.  Dr. Bronson Ing ordered that she start an injectable PCSK9.  Patient wants to defer starting injectable for now.   Medication Adjustments/Labs and Tests Ordered: Current medicines are reviewed at length with the patient today.  Concerns regarding medicines are outlined above.   Disposition: Follow-up with the [facility] or APP 6 months Signed, Levell July, NP 01/14/2020 10:52 AM    [facility] [address], [place], [place] [address] Phone: [phone]; Fax: [phone]

## 2020-01-14 ENCOUNTER — Ambulatory Visit (INDEPENDENT_AMBULATORY_CARE_PROVIDER_SITE_OTHER): Payer: Medicare HMO | Admitting: Family Medicine

## 2020-01-14 ENCOUNTER — Encounter: Payer: Self-pay | Admitting: Family Medicine

## 2020-01-14 VITALS — BP 172/112 | HR 69 | Ht 63.0 in | Wt 183.0 lb

## 2020-01-14 DIAGNOSIS — E782 Mixed hyperlipidemia: Secondary | ICD-10-CM

## 2020-01-14 DIAGNOSIS — I10 Essential (primary) hypertension: Secondary | ICD-10-CM

## 2020-01-14 DIAGNOSIS — I2510 Atherosclerotic heart disease of native coronary artery without angina pectoris: Secondary | ICD-10-CM

## 2020-01-14 MED ORDER — CLONIDINE HCL 0.1 MG PO TABS: 0.1000 mg | ORAL_TABLET | Freq: Two times a day (BID) | ORAL | 1 refills | Status: DC

## 2020-01-14 NOTE — Addendum Note (Signed)
Addended by: Julian Hy T on: 01/14/2020 03:23 PM   Modules accepted: Orders

## 2020-01-14 NOTE — Patient Instructions (Signed)
Your physician wants you to follow-up in: [place] MD   Your physician has recommended you make the following change in your medication:   START CLONIDINE 0.1 MG TWICE DAILY   WE WILL RESCHEDULE YOU FOR HYPERTENSION CLINIC   Thank you for choosing [facility]!!

## 2020-01-23 ENCOUNTER — Emergency Department (HOSPITAL_COMMUNITY): Payer: Medicare HMO

## 2020-01-23 ENCOUNTER — Emergency Department (HOSPITAL_COMMUNITY)
Admission: EM | Admit: 2020-01-23 | Discharge: 2020-01-23 | Disposition: A | Discharge status: Home / Self Care | Payer: Medicare HMO | Attending: Emergency Medicine | Admitting: Emergency Medicine

## 2020-01-23 ENCOUNTER — Other Ambulatory Visit: Payer: Self-pay

## 2020-01-23 ENCOUNTER — Encounter (HOSPITAL_COMMUNITY): Payer: Self-pay | Admitting: Emergency Medicine

## 2020-01-23 DIAGNOSIS — R072 Precordial pain: Secondary | ICD-10-CM | POA: Diagnosis not present

## 2020-01-23 DIAGNOSIS — I2510 Atherosclerotic heart disease of native coronary artery without angina pectoris: Secondary | ICD-10-CM | POA: Diagnosis not present

## 2020-01-23 DIAGNOSIS — E039 Hypothyroidism, unspecified: Secondary | ICD-10-CM | POA: Insufficient documentation

## 2020-01-23 DIAGNOSIS — I10 Essential (primary) hypertension: Secondary | ICD-10-CM | POA: Insufficient documentation

## 2020-01-23 DIAGNOSIS — R079 Chest pain, unspecified: Secondary | ICD-10-CM | POA: Diagnosis not present

## 2020-01-23 LAB — BASIC METABOLIC PANEL
Anion gap: 9 (ref 5–15)
BUN: 16 mg/dL (ref 8–23)
CO2: 28 mmol/L (ref 22–32)
Calcium: 9.5 mg/dL (ref 8.9–10.3)
Chloride: 102 mmol/L (ref 98–111)
Creatinine, Ser: 1.01 mg/dL — ABNORMAL HIGH (ref 0.44–1.00)
GFR, Estimated: 59 mL/min — ABNORMAL LOW (ref >60)
Glucose, Bld: 90 mg/dL (ref 70–99)
Potassium: 3.6 mmol/L (ref 3.5–5.1)
Sodium: 139 mmol/L (ref 135–145)

## 2020-01-23 LAB — CBC
HCT: 48 % — ABNORMAL HIGH (ref 36.0–46.0)
Hemoglobin: 15.5 g/dL — ABNORMAL HIGH (ref 12.0–15.0)
MCH: 30.9 pg (ref 26.0–34.0)
MCHC: 32.3 g/dL (ref 30.0–36.0)
MCV: 95.6 fL (ref 80.0–100.0)
Platelets: 550 10*3/uL — ABNORMAL HIGH (ref 150–400)
RBC: 5.02 MIL/uL (ref 3.87–5.11)
RDW: 15.5 % (ref 11.5–15.5)
WBC: 5.3 10*3/uL (ref 4.0–10.5)
nRBC: 0 % (ref 0.0–0.2)

## 2020-01-23 LAB — TROPONIN I (HIGH SENSITIVITY)
Troponin I (High Sensitivity): 9 ng/L (ref <18)
Troponin I (High Sensitivity): 10 ng/L (ref <18)

## 2020-01-23 MED ORDER — ASPIRIN 325 MG PO TABS
325.0000 mg | ORAL_TABLET | Freq: Once | ORAL | Status: DC
  Filled 2020-01-23: qty 1

## 2020-01-23 MED ORDER — ACETAMINOPHEN 500 MG PO TABS
1000.0000 mg | ORAL_TABLET | Freq: Once | ORAL | Status: AC
  Administered 2020-01-23: 1000 mg via ORAL
  Filled 2020-01-23: qty 2

## 2020-01-23 MED ORDER — DICLOFENAC SODIUM 1 % EX GEL: 2.0000 g | Freq: Four times a day (QID) | CUTANEOUS | 0 refills | Status: DC | PRN

## 2020-01-23 NOTE — ED Provider Notes (Signed)
Emergency Department Provider Note   I have reviewed the triage vital signs and the nursing notes.   HISTORY  Chief Complaint Chest Pain   HPI [name] [name] is a 73 y.o. female with past medical history reviewed below including prior CAD with cath in 2015 presents to the emergency department for evaluation of acute onset central chest pain radiating to the back and left arm.  Patient states that she was reaching up in the cabinet for some spices when pain began.  Pain is worse with movement and touching in the area.  She is not feeling short of breath.  She did not get diaphoretic, short of breath, or experience nausea.  No fevers or chills.  Pain is been mostly constant throughout the day.  She states that it does not feel the same as her ACS pain from 2015.    Past Medical History:  Diagnosis Date  . Anxiety   . Heart attack ([place])   . History of echocardiogram    a. 2D ECHO: 09/21/2013; EF 55-60%; mild concentric hypertrophy, G1DD, mild LA dilation, PA pressure 37. Small RV pericardial effusion  . Hypertension   . Hypothyroidism   . NSTEMI (non-ST elevated myocardial infarction) ([place])    a.  Related to occlusion of the distal Cx. The mid to distal obtuse marginal #1 also contains a significant stenosis that is best treated with medical therapy. Patent RCA/LAD with tortuosity. LM patent. EF 55%.   . Sleep apnea   . Thyroid disease     Patient Active Problem List   Diagnosis Date Noted  . Statin intolerance 11/16/2019  . Statin myopathy 11/16/2019  . Neck pain 09/14/2016  . Paresthesia 09/14/2016  . Chest pain 09/26/2013  . HTN (hypertension) 09/22/2013  . Hypothyroidism   . Sleep apnea   . Anxiety   . NSTEMI (non-ST elevated myocardial infarction) ([place]) 09/20/2013  . HLD (hyperlipidemia) 01/17/2008  . Essential hypertension 01/17/2008  . CORONARY ARTERY DISEASE 01/17/2008    Past Surgical History:  Procedure Laterality Date  . ABDOMINAL HYSTERECTOMY    .  CARDIAC CATHETERIZATION  ~ 2000  . CORONARY ANGIOPLASTY  09/20/2013  . LEFT HEART CATHETERIZATION WITH CORONARY ANGIOGRAM N/A 09/20/2013   Procedure: LEFT HEART CATHETERIZATION WITH CORONARY ANGIOGRAM;  Surgeon: Sinclair Grooms, MD;  Location: [facility] CATH LAB;  Service: Cardiovascular;  Laterality: N/A;  . LEFT HEART CATHETERIZATION WITH CORONARY ANGIOGRAM N/A 09/27/2013   Procedure: LEFT HEART CATHETERIZATION WITH CORONARY ANGIOGRAM;  Surgeon: Blane Ohara, MD;  Location: [facility] CATH LAB;  Service: Cardiovascular;  Laterality: N/A;  . THYROIDECTOMY  10/08/2011   Procedure: THYROIDECTOMY;  Surgeon: [name], MD;  Location: [facility] ORS;  Service: General;  Laterality: N/A;  Total  . VAGINAL HYSTERECTOMY  1993    Allergies Fluviral [influenza virus vaccine], Amlodipine, Lisinopril, and Ranexa [ranolazine]  History reviewed. No pertinent family history.  Social History Social History   Tobacco Use  . Smoking status: Never Smoker  . Smokeless tobacco: Never Used  Vaping Use  . Vaping Use: Never used  Substance Use Topics  . Alcohol use: No    Alcohol/week: 0.0 standard drinks  . Drug use: No    Review of Systems  Constitutional: No fever/chills Eyes: No visual changes. ENT: No sore throat. Cardiovascular: Positive chest pain. Respiratory: Denies shortness of breath. Gastrointestinal: No abdominal pain.  No nausea, no vomiting.  No diarrhea.  No constipation. Genitourinary: Negative for dysuria. Musculoskeletal: Positive for back pain. Skin: Negative  for rash. Neurological: Negative for headaches, focal weakness or numbness.  10-point ROS otherwise negative.  ____________________________________________   PHYSICAL EXAM:  VITAL SIGNS: ED Triage Vitals  Enc Vitals Group     BP 01/23/20 1632 (!) 199/106     Pulse Rate 01/23/20 1632 77     Resp 01/23/20 1632 20     Temp 01/23/20 1632 98.3 F (36.8 C)     Temp Source 01/23/20 1632 Oral     SpO2 01/23/20 1632 95 %      Weight 01/23/20 1633 174 lb (78.9 kg)     Height 01/23/20 1633 5\' 3"  (1.6 m)   Constitutional: Alert and oriented. Well appearing and in no acute distress. Eyes: Conjunctivae are normal.  Head: Atraumatic. Nose: No congestion/rhinnorhea. Mouth/Throat: Mucous membranes are moist.  Neck: No stridor.   Cardiovascular: Normal rate, regular rhythm. Good peripheral circulation. Grossly normal heart sounds.   Respiratory: Normal respiratory effort.  No retractions. Lungs CTAB. Gastrointestinal: Soft and nontender. No distention.  Musculoskeletal: No lower extremity tenderness nor edema. No gross deformities of extremities.  Chest pain is reproducible to palpation over the sternum and left lateral chest wall.  No crepitus. No bruising.  Neurologic:  Normal speech and language. No gross focal neurologic deficits are appreciated.  Skin:  Skin is warm, dry and intact. No rash noted.  ____________________________________________   LABS (all labs ordered are listed, but only abnormal results are displayed)  Labs Reviewed  BASIC METABOLIC PANEL - Abnormal; Notable for the following components:      Result Value   Creatinine, Ser 1.01 (*)    GFR, Estimated 59 (*)    All other components within normal limits  CBC - Abnormal; Notable for the following components:   Hemoglobin 15.5 (*)    HCT 48.0 (*)    Platelets 550 (*)    All other components within normal limits  TROPONIN I (HIGH SENSITIVITY)  TROPONIN I (HIGH SENSITIVITY)   ____________________________________________  EKG   EKG Interpretation  Date/Time:  Wednesday January 23 2020 16:29:38 EST Ventricular Rate:  75 PR Interval:  162 QRS Duration: 78 QT Interval:  400 QTC Calculation: 446 R Axis:   -30 Text Interpretation: Normal sinus rhythm Possible Left atrial enlargement Left axis deviation Left ventricular hypertrophy with repolarization abnormality ( R in aVL , Cornell product ) Anteroseptal infarct , age undetermined  Abnormal ECG No STEMI Confirmed by Nanda Quinton [phone]) on 01/23/2020 4:39:27 PM       ____________________________________________  RADIOLOGY  DG Chest 2 View  Result Date: 01/23/2020 CLINICAL DATA:  Chest pain. EXAM: CHEST - 2 VIEW COMPARISON:  October 16, 2019. FINDINGS: The heart size and mediastinal contours are within normal limits. Both lungs are clear. No pneumothorax or pleural effusion is noted. The visualized skeletal structures are unremarkable. IMPRESSION: No active cardiopulmonary disease. Electronically Signed   By: Marijo Conception M.D.   On: 01/23/2020 16:51    ____________________________________________   PROCEDURES  Procedure(s) performed:   Procedures  None  ____________________________________________   INITIAL IMPRESSION / ASSESSMENT AND PLAN / ED COURSE  Pertinent labs & imaging results that were available during my care of the patient were reviewed by me and considered in my medical decision making (see chart for details).   Patient presents to the emergency department for evaluation of chest pain which began with reaching for something on a high shelf earlier today.  Her pain is reproducible on my exam she grimaces in pain and withdraws when  I touch the sternum more left lateral chest.  She reports that this reproduces the pain she is experiencing and is not hurting in some different way.  Her EKG appears similar to prior based on my interpretation above. CXR with no acute findings. Plan for serial troponins to evaluate further for ACS despite exam. Considered PE, dissection, and aneurysm but feel these do not correlate well clinically.   At this time, I do not feel there is any life-threatening condition present. I have reviewed and discussed all results (EKG, imaging, lab, urine as appropriate), exam findings with patient. I have reviewed nursing notes and appropriate previous records.  I feel the patient is safe to be discharged home without further  emergent workup. Discussed usual and customary return precautions. Patient and family (if present) verbalize understanding and are comfortable with this plan.  Patient will follow-up with their primary care provider. If they do not have a primary care provider, information for follow-up has been provided to them. All questions have been answered.  ____________________________________________  FINAL CLINICAL IMPRESSION(S) / ED DIAGNOSES  Final diagnoses:  Precordial chest pain     MEDICATIONS GIVEN DURING THIS VISIT:  Medications  acetaminophen (TYLENOL) tablet 1,000 mg (1,000 mg Oral Given 01/23/20 1829)  acetaminophen (TYLENOL) tablet 1,000 mg (1,000 mg Oral Given 01/23/20 2249)     NEW OUTPATIENT MEDICATIONS STARTED DURING THIS VISIT:  Discharge Medication List as of 01/23/2020 10:17 PM    START taking these medications   Details  diclofenac Sodium (VOLTAREN) 1 % GEL Apply 2 g topically 4 (four) times daily as needed., Starting Wed 01/23/2020, Normal        Note:  This document was prepared using Dragon voice recognition software and may include unintentional dictation errors.  Nanda Quinton, MD, [facility]    Bethaney Oshana, [facility], MD 01/29/20 [phone]

## 2020-01-23 NOTE — Discharge Instructions (Signed)

## 2020-01-23 NOTE — ED Notes (Signed)
Patient discharged to home.  All discharge instructions reviewed.  Patient verbalized understanding via teachback method.  Ambulatory out of ED.

## 2020-01-23 NOTE — ED Triage Notes (Signed)
Pt c/o chest pain that began this morning. Pain radiates to he left arm and left lower back.

## 2020-01-28 DIAGNOSIS — H25011 Cortical age-related cataract, right eye: Secondary | ICD-10-CM | POA: Diagnosis not present

## 2020-01-28 DIAGNOSIS — H2511 Age-related nuclear cataract, right eye: Secondary | ICD-10-CM | POA: Diagnosis not present

## 2020-01-28 DIAGNOSIS — H35033 Hypertensive retinopathy, bilateral: Secondary | ICD-10-CM | POA: Diagnosis not present

## 2020-01-28 DIAGNOSIS — H35013 Changes in retinal vascular appearance, bilateral: Secondary | ICD-10-CM | POA: Diagnosis not present

## 2020-01-28 NOTE — Progress Notes (Signed)
Cardiology Office Note  Date: 01/29/2020   ID: Takhia, Spoon 08-03-1945, MRN [id number]  PCP:  Rosita Fire, MD  Cardiologist:  No primary care provider on file. Electrophysiologist:  None   Chief Complaint: Follow-up CAD  History of Present Illness: [name] [name] is a 74 y.o. female with a history of CAD, NSTEMI, HTN, HLD, DM2 and OSA  August 2015 chest pain with NSTEMI.  Cardiac cath showed distally occluded OM1 60 to 80%.  It was treated medically at that time and she was discharged home.  She is admitted with chest pain a few days later and repeat catheterization showed some total occlusion of medium caliber were highly tortuous OM 1 worse from prior study.  Described as unfavorable for PCI though if refractory angina PCI could be considered per the notes.  09/2013 echo EF 55 to 60%.  Had a low risk stress test August 2017.  Previously intolerant of Ranexa, Imdur, amlodipine, lisinopril.  Intolerant of Crestor and pravastatin.  Last LDL 107 on 2018-06-28  Last saw Dr. Bronson Ing 2019-05-22 via telemedicine.  Her blood pressure was mildly elevated.  Her coronary artery disease was stable.  He stated she may need PCSK9 inhibitors in the future due to intolerance to statins.  She presented at a previous visit with complaints of tingling in her right foot with complaints of recurrent headaches.  Also complaining of recent sinus issues which she believed may be contributing to her headache.  Her initial blood pressure on arrival was 118/115.  Recheck manually was 190/105.  She was supposed to be taking chlorthalidone 25 mg daily.  She was not taking as directed.  Stated she took 1 chlorthalidone the other day due to feeling a headache and feeling like she was filling up with fluid.  Denied any other anginal or exertional symptoms, stroke or TIA-like symptoms, orthostatic symptoms, palpitations or arrhythmias, claudication-like symptoms, or lower extremity edema.  Presented  last visit for for follow-up after starting hydralazine 25 mg 4 times daily for hypertension.  She stated the hydralazine was causing her to have significant headaches and she had to lay down for hours due to the headaches.  Blood pressure had improved some.  Her hydralazine was stopped.  She continued to take her chlorthalidone 25 mg daily.  She denied any anginal or exertional issues, palpitations or arrhythmias, orthostatic symptoms, CVA or TIA-like symptoms, bleeding issues, claudication, DVT or PE symptoms, or lower extremity edema.  She presented last visit for follow-up.  Blood pressure was significantly elevated  She was scheduled to follow-up with hypertension clinic.  She missed 2 appointments and canceled the other.  She stated she had deaths in the family and was unable to go to these appointments.  She was scheduled for stress test but refused to Covid test to allow her to undergo the stress test.  She denies any anginal or exertional symptoms, orthostatic symptoms, CVA or TIA-like symptoms, palpitations or arrhythmias.  Denies any PND, orthopnea, claudication-like symptoms, DVT or PE-like symptoms, or lower extremity edema.   Recent ED visit 01/23/2020 for complaint of central chest pain radiating to the back and left arm.  Pain was worse with movement and touching the area.  She did not feel short of breath.  Denied any diaphoresis or nausea.  Pain had been constant throughout the day.  She stated this did not feel the same as her ACS pain from 2015.  Her EKG appeared similar to prior EKG.  Chest x-ray with  no acute findings. X2.  Basic metabolic panel showed creatinine 1.01 and GFR 59 otherwise unremarkable.  CBC unremarkable.  Blood pressure was significantly elevated at 199/106 during recent ED visit.  She was  discharged home.  She is here today for follow-up.  She currently denies any chest pain, pressure, tightness, or radiation to neck, arm, back, or jaw pain.  Denies any associated  nausea, vomiting, diaphoresis.  Blood pressure remains significantly elevated.  She has not been compliant with her metoprolol states she has been taking it every other day.  Occasionally takes her chlorthalidone.  States she is compliant with her clonidine.  She agrees to reschedule her stress test today.  She agrees to follow-up with Dr. Oval Linsey at the hypertension clinic where she has a scheduled visit in January on the 28th at 10 AM.  There are significant issues with medication compliance.  Past Medical History:  Diagnosis Date  . Anxiety   . Heart attack ([place])   . History of echocardiogram    a. 2D ECHO: 09/21/2013; EF 55-60%; mild concentric hypertrophy, G1DD, mild LA dilation, PA pressure 37. Small RV pericardial effusion  . Hypertension   . Hypothyroidism   . NSTEMI (non-ST elevated myocardial infarction) ([place])    a.  Related to occlusion of the distal Cx. The mid to distal obtuse marginal #1 also contains a significant stenosis that is best treated with medical therapy. Patent RCA/LAD with tortuosity. LM patent. EF 55%.   . Sleep apnea   . Thyroid disease     Past Surgical History:  Procedure Laterality Date  . ABDOMINAL HYSTERECTOMY    . CARDIAC CATHETERIZATION  ~ 2000  . CORONARY ANGIOPLASTY  09/20/2013  . LEFT HEART CATHETERIZATION WITH CORONARY ANGIOGRAM N/A 09/20/2013   Procedure: LEFT HEART CATHETERIZATION WITH CORONARY ANGIOGRAM;  Surgeon: Sinclair Grooms, MD;  Location[facility] C[facility] LAB;  Service: Cardiovascular;  Laterality: N/A;  . LEFT HEART CATHETERIZATION WITH CORONARY ANGIOGRAM N/A 09/27/2013   Procedure: LEFT HEART CATHETERIZATION WITH CORONARY ANGIOGRAM;  Surgeon: Blane Ohara, MD;  Location: [facility] CATH LAB;  Service: Cardiovascular;  Laterality: N/A;  . THYROIDECTOMY  10/08/2011   Procedure: THYROIDECTOMY;  Surgeon: [name], MD;  Location: [facility] ORS;  Service: General;  Laterality: N/A;  Total  . VAGINAL HYSTERECTOMY  1993    Current Outpatient Medications   Medication Sig Dispense Refill  . cloNIDine (CATAPRES) 0.1 MG tablet Take 1 tablet (0.1 mg total) by mouth 2 (two) times daily. 180 tablet 1  . diclofenac Sodium (VOLTAREN) 1 % GEL Apply 2 g topically 4 (four) times daily as needed. 50 g 0  . levothyroxine (SYNTHROID, LEVOTHROID) 125 MCG tablet Take 125 mcg by mouth daily.     . nitroGLYCERIN (NITROSTAT) 0.4 MG SL tablet TAKE 1 TABLET UNDER THE TONGUE EVERY 5 MINS FOR CHEST PAIN - AFTER 2 DOSES GO TO ED 25 tablet 3  . chlorthalidone (HYGROTON) 25 MG tablet Take 1 tablet (25 mg total) by mouth daily. (Patient not taking: No sig reported) 30 tablet 6  . metoprolol succinate (TOPROL-XL) 100 MG 24 hr tablet Take 1 tablet (100 mg total) by mouth daily. (Patient not taking: Reported on 01/29/2020) 30 tablet 6   No current facility-administered medications for this visit.   Allergies:  Fluviral [influenza virus vaccine], Amlodipine, Lisinopril, and Ranexa [ranolazine]   Social History: The patient  reports that she has never smoked. She has never used smokeless tobacco. She reports that she does not drink alcohol  and does not use drugs.   Family History: The patient's family history is not on file.   ROS:  Please see the history of present illness. Otherwise, complete review of systems is positive for none.  All other systems are reviewed and negative.   Physical Exam: VS:  BP (!) 210/130   Pulse 64   Ht 5\' 3"  (1.6 m)   Wt 182 lb 3.2 oz (82.6 kg)   SpO2 99%   BMI 32.28 kg/m , BMI Body mass index is 32.28 kg/m.  Wt Readings from Last 3 Encounters:  01/29/20 182 lb 3.2 oz (82.6 kg)  01/23/20 174 lb (78.9 kg)  01/14/20 183 lb (83 kg)    General: Patient appears comfortable at rest. Neck: Supple, no elevated JVP or carotid bruits, no thyromegaly. Lungs: Clear to auscultation, nonlabored breathing at rest. Cardiac: Regular rate and rhythm, no S3 or significant systolic murmur, no pericardial rub. Extremities: No pitting edema, distal  pulses 2+. Skin: Warm and dry. Musculoskeletal: No kyphosis. Neuropsychiatric: Alert and oriented x3, affect grossly appropriate.  ECG:  An ECG dated 07/13/2019 was personally reviewed today and demonstrated:  Sinus rhythm rate of 61, possible left atrial enlargement, anteroseptal infarct, age undetermined.  Recent Labwork: 09/04/2019: ALT 23; AST 27; TSH 4.301 01/23/2020: BUN 16; Creatinine, Ser 1.01; Hemoglobin 15.5; Platelets 550; Potassium 3.6; Sodium 139     Component Value Date/Time   CHOL 194 09/04/2019 1216   TRIG 63 09/04/2019 1216   HDL 54 09/04/2019 1216   CHOLHDL 3.6 09/04/2019 1216   VLDL 13 09/04/2019 1216   LDLCALC 127 (H) 09/04/2019 1216    Other Studies Reviewed Today:  Lower arterial duplex 02/23/2018  FINDINGS: Right ABI:  1.06  Left ABI:  1.08  Right Lower Extremity:  Normal arterial waveforms at the ankle.  Left Lower Extremity:  Normal arterial waveforms at the ankle.  IMPRESSION: No evidence of hemodynamically significant lower extremity arterial occlusive disease at rest.  Assessment and Plan:   1. Essential hypertension   Increased Toprol to 100 mg daily.  Continued chlorthalidone 25 mg daily.  Referred her to the hypertension clinic with Dr. Oval Linsey for management.  Apparently she was a no-show x 2 to see Dr. Oval Linsey that hypertension clinic and she canceled 1 visit.  She stated at last visit she had some deaths in the family which was the reason she was unable to follow-up.  We will reschedule her to see Dr. Oval Linsey at hypertension clinic.  At last visit added clonidine 0.1 mg p.o. twice daily to current antihypertensive regimen.  Continue chlorthalidone 25 mg p.o. daily.  Continue Toprol-XL 100 mg daily.  Advised her of applications of not controlling her blood pressure i.e. CVA , MI, renal failure.  She verbalizes understanding and states she will follow up with Dr. Oval Linsey at her scheduled appointment January 20 at 10 AM.  2. CAD in native  artery We schedule a stress test at a previous visit for complaints of chest pain.  She states she was told she needed a Covid test prior to the stress test and refused to have it.  Now I am being told she does not need a Covid test and she is willing to undergo a Lexiscan stress test.  Please reschedule Lexiscan Myoview  3. Hyperlipidemia LDL goal <70 Not tolerant of statin medications.  Dr. Bronson Ing ordered that she start an injectable PCSK9.  Patient wants to defer starting injectable for now.   Medication Adjustments/Labs and Tests Ordered: Current  medicines are reviewed at length with the patient today.  Concerns regarding medicines are outlined above.   Disposition: Follow-up with the [facility] or [facility] 3 months Signed, Levell July, NP 01/29/2020 9:11 AM    [facility] [address], [place], [place] [address] Phone: [phone]; Fax: [phone]

## 2020-01-29 ENCOUNTER — Ambulatory Visit (INDEPENDENT_AMBULATORY_CARE_PROVIDER_SITE_OTHER): Payer: Medicare HMO | Admitting: Family Medicine

## 2020-01-29 ENCOUNTER — Encounter: Payer: Self-pay | Admitting: Family Medicine

## 2020-01-29 VITALS — BP 210/130 | HR 64 | Ht 63.0 in | Wt 182.2 lb

## 2020-01-29 DIAGNOSIS — I2510 Atherosclerotic heart disease of native coronary artery without angina pectoris: Secondary | ICD-10-CM | POA: Diagnosis not present

## 2020-01-29 DIAGNOSIS — I10 Essential (primary) hypertension: Secondary | ICD-10-CM

## 2020-01-29 DIAGNOSIS — E782 Mixed hyperlipidemia: Secondary | ICD-10-CM | POA: Diagnosis not present

## 2020-01-29 NOTE — Patient Instructions (Addendum)
Medication Instructions:   Your physician recommends that you continue on your current medications as directed. Please refer to the Current Medication list given to you today.  Please take medications the way they are prescribed  Labwork:  None  Testing/Procedures: Your physician has requested that you have a lexiscan myoview. For further information please visit [email or web address]. Please follow instruction sheet, as given.  Follow-Up:  Your physician recommends that you schedule a follow-up appointment in: 3 months with the doctor.  Any Other Special Instructions Will Be Listed Below (If Applicable).  If you need a refill on your cardiac medications before your next appointment, please call your pharmacy.

## 2020-01-30 DIAGNOSIS — I10 Essential (primary) hypertension: Secondary | ICD-10-CM | POA: Diagnosis not present

## 2020-01-30 DIAGNOSIS — R0789 Other chest pain: Secondary | ICD-10-CM | POA: Diagnosis not present

## 2020-01-30 DIAGNOSIS — J0100 Acute maxillary sinusitis, unspecified: Secondary | ICD-10-CM | POA: Diagnosis not present

## 2020-02-04 ENCOUNTER — Encounter (HOSPITAL_COMMUNITY): Payer: Medicare HMO

## 2020-02-11 ENCOUNTER — Encounter (HOSPITAL_COMMUNITY): Payer: Medicare HMO

## 2020-02-11 ENCOUNTER — Encounter (HOSPITAL_COMMUNITY): Payer: Medicare HMO | Attending: Family Medicine

## 2020-02-18 ENCOUNTER — Other Ambulatory Visit: Payer: Self-pay | Admitting: Family Medicine

## 2020-02-28 ENCOUNTER — Encounter: Payer: Self-pay | Admitting: Cardiovascular Disease

## 2020-02-28 ENCOUNTER — Ambulatory Visit (INDEPENDENT_AMBULATORY_CARE_PROVIDER_SITE_OTHER): Payer: Medicare HMO | Admitting: Cardiovascular Disease

## 2020-02-28 ENCOUNTER — Other Ambulatory Visit: Payer: Self-pay

## 2020-02-28 VITALS — BP 210/112 | HR 71 | Ht 63.0 in | Wt 185.0 lb

## 2020-02-28 DIAGNOSIS — G720 Drug-induced myopathy: Secondary | ICD-10-CM | POA: Diagnosis not present

## 2020-02-28 DIAGNOSIS — I2510 Atherosclerotic heart disease of native coronary artery without angina pectoris: Secondary | ICD-10-CM

## 2020-02-28 DIAGNOSIS — T466X5A Adverse effect of antihyperlipidemic and antiarteriosclerotic drugs, initial encounter: Secondary | ICD-10-CM

## 2020-02-28 DIAGNOSIS — I10 Essential (primary) hypertension: Secondary | ICD-10-CM

## 2020-02-28 DIAGNOSIS — I214 Non-ST elevation (NSTEMI) myocardial infarction: Secondary | ICD-10-CM | POA: Diagnosis not present

## 2020-02-28 DIAGNOSIS — R072 Precordial pain: Secondary | ICD-10-CM

## 2020-02-28 DIAGNOSIS — T466X5D Adverse effect of antihyperlipidemic and antiarteriosclerotic drugs, subsequent encounter: Secondary | ICD-10-CM

## 2020-02-28 MED ORDER — CARVEDILOL 25 MG PO TABS: 25.0000 mg | ORAL_TABLET | Freq: Two times a day (BID) | ORAL | 1 refills | Status: DC

## 2020-02-28 NOTE — Patient Instructions (Addendum)
Medication Instructions:  STOP METOPROLOL   START CARVEDILOL 25 MG TWICE A DAY   TAKE YOUR CARVEDILOL AND CLONIDINE EVERY 12 HOURS  TALE YOUR LEVOTHYROXINE 1 HOUR PRIOR TO YOUR OTHER MEDICATIONS    Labwork: RENIN/ALDOSTERON TODAY    Testing/Procedures: Your physician has requested that you have a renal artery duplex. During this test, an ultrasound is used to evaluate blood flow to the kidneys. Allow one hour for this exam. Do not eat after midnight the day before and avoid carbonated beverages. Take your medications as you usually do.   Follow-Up: 04/02/2020 AT 10:30 AM WITH PHARM D    You will receive a phone call from the [facility] exercise and nutrition program to schedule an initial assessment.  Special Instructions:   [facility] OUR CARE GUIDE WILL CALL YOU TO SCHEDULE APPOINTMENT   MONITOR YOUR BLOOD PRESSURE TWICE A DAY, LOG IN THE BOOK PROVIDED. BRING THE BOOK AND YOUR BLOOD PRESSURE MACHINE TO YOUR FOLLOW UP IN 1 MONTH  DASH Eating Plan DASH stands for "Dietary Approaches to Stop Hypertension." The DASH eating plan is a healthy eating plan that has been shown to reduce high blood pressure (hypertension). It may also reduce your risk for type 2 diabetes, heart disease, and stroke. The DASH eating plan may also help with weight loss. What are tips for following this plan?  General guidelines  Avoid eating more than 2,300 mg (milligrams) of salt (sodium) a day. If you have hypertension, you may need to reduce your sodium intake to 1,500 mg a day.  Limit alcohol intake to no more than 1 drink a day for nonpregnant women and 2 drinks a day for men. One drink equals 12 oz of beer, 5 oz of wine, or 1 oz of hard liquor.  Work with your health care provider to maintain a healthy body weight or to lose weight. Ask what an ideal weight is for you.  Get at least 30 minutes of exercise that causes your heart to beat faster (aerobic exercise) most days of the week. Activities may include  walking, swimming, or biking.  Work with your health care provider or diet and nutrition specialist (dietitian) to adjust your eating plan to your individual calorie needs. Reading food labels   Check food labels for the amount of sodium per serving. Choose foods with less than 5 percent of the Daily Value of sodium. Generally, foods with less than 300 mg of sodium per serving fit into this eating plan.  To find whole grains, look for the word "whole" as the first word in the ingredient list. Shopping  Buy products labeled as "low-sodium" or "no salt added."  Buy fresh foods. Avoid canned foods and premade or frozen meals. Cooking  Avoid adding salt when cooking. Use salt-free seasonings or herbs instead of table salt or sea salt. Check with your health care provider or pharmacist before using salt substitutes.  Do not fry foods. Cook foods using healthy methods such as baking, boiling, grilling, and broiling instead.  Cook with heart-healthy oils, such as olive, canola, soybean, or sunflower oil. Meal planning  Eat a balanced diet that includes: ? 5 or more servings of fruits and vegetables each day. At each meal, try to fill half of your plate with fruits and vegetables. ? Up to 6-8 servings of whole grains each day. ? Less than 6 oz of lean meat, poultry, or fish each day. A 3-oz serving of meat is about the same size as a deck of  cards. One egg equals 1 oz. ? 2 servings of low-fat dairy each day. ? A serving of nuts, seeds, or beans 5 times each week. ? Heart-healthy fats. Healthy fats called Omega-3 fatty acids are found in foods such as flaxseeds and coldwater fish, like sardines, salmon, and mackerel.  Limit how much you eat of the following: ? Canned or prepackaged foods. ? Food that is high in trans fat, such as fried foods. ? Food that is high in saturated fat, such as fatty meat. ? Sweets, desserts, sugary drinks, and other foods with added sugar. ? Full-fat dairy  products.  Do not salt foods before eating.  Try to eat at least 2 vegetarian meals each week.  Eat more home-cooked food and less restaurant, buffet, and fast food.  When eating at a restaurant, ask that your food be prepared with less salt or no salt, if possible. What foods are recommended? The items listed may not be a complete list. Talk with your dietitian about what dietary choices are best for you. Grains Whole-grain or whole-wheat bread. Whole-grain or whole-wheat pasta. Brown rice. [place]. Bulgur. Whole-grain and low-sodium cereals. Pita bread. Low-fat, low-sodium crackers. Whole-wheat flour tortillas. Vegetables Fresh or frozen vegetables (raw, steamed, roasted, or grilled). Low-sodium or reduced-sodium tomato and vegetable juice. Low-sodium or reduced-sodium tomato sauce and tomato paste. Low-sodium or reduced-sodium canned vegetables. Fruits All fresh, dried, or frozen fruit. Canned fruit in natural juice (without added sugar). Meat and other protein foods Skinless chicken or [place]. Ground chicken or [place]. Pork with fat trimmed off. Fish and seafood. Egg whites. Dried beans, peas, or lentils. Unsalted nuts, nut butters, and seeds. Unsalted canned beans. Lean cuts of beef with fat trimmed off. Low-sodium, lean deli meat. Dairy Low-fat (1%) or fat-free (skim) milk. Fat-free, low-fat, or reduced-fat cheeses. Nonfat, low-sodium ricotta or cottage cheese. Low-fat or nonfat yogurt. Low-fat, low-sodium cheese. Fats and oils Soft margarine without trans fats. Vegetable oil. Low-fat, reduced-fat, or light mayonnaise and salad dressings (reduced-sodium). Canola, safflower, olive, soybean, and sunflower oils. Avocado. Seasoning and other foods Herbs. Spices. Seasoning mixes without salt. Unsalted popcorn and pretzels. Fat-free sweets. What foods are not recommended? The items listed may not be a complete list. Talk with your dietitian about what dietary choices are best for  you. Grains Baked goods made with fat, such as croissants, muffins, or some breads. Dry pasta or rice meal packs. Vegetables Creamed or fried vegetables. Vegetables in a cheese sauce. Regular canned vegetables (not low-sodium or reduced-sodium). Regular canned tomato sauce and paste (not low-sodium or reduced-sodium). Regular tomato and vegetable juice (not low-sodium or reduced-sodium). Angie Fava. Olives. Fruits Canned fruit in a light or heavy syrup. Fried fruit. Fruit in cream or butter sauce. Meat and other protein foods Fatty cuts of meat. Ribs. Fried meat. Berniece Salines. Sausage. Bologna and other processed lunch meats. Salami. Fatback. Hotdogs. Bratwurst. Salted nuts and seeds. Canned beans with added salt. Canned or smoked fish. Whole eggs or egg yolks. Chicken or [place] with skin. Dairy Whole or 2% milk, cream, and half-and-half. Whole or full-fat cream cheese. Whole-fat or sweetened yogurt. Full-fat cheese. Nondairy creamers. Whipped toppings. Processed cheese and cheese spreads. Fats and oils Butter. Stick margarine. Lard. Shortening. Ghee. Bacon fat. Tropical oils, such as coconut, palm kernel, or palm oil. Seasoning and other foods Salted popcorn and pretzels. Onion salt, garlic salt, seasoned salt, table salt, and sea salt. Worcestershire sauce. Tartar sauce. Barbecue sauce. Teriyaki sauce. Soy sauce, including reduced-sodium. Steak sauce. Canned and packaged  gravies. Fish sauce. Oyster sauce. Cocktail sauce. Horseradish that you find on the shelf. Ketchup. Mustard. Meat flavorings and tenderizers. Bouillon cubes. Hot sauce and Tabasco sauce. Premade or packaged marinades. Premade or packaged taco seasonings. Relishes. Regular salad dressings. Where to find more information:  National Heart, Lung, and [place]: https://wilson-eaton.com/  American Heart Association: www.heart.org Summary  The DASH eating plan is a healthy eating plan that has been shown to reduce high blood pressure  (hypertension). It may also reduce your risk for type 2 diabetes, heart disease, and stroke.  With the DASH eating plan, you should limit salt (sodium) intake to 2,300 mg a day. If you have hypertension, you may need to reduce your sodium intake to 1,500 mg a day.  When on the DASH eating plan, aim to eat more fresh fruits and vegetables, whole grains, lean proteins, low-fat dairy, and heart-healthy fats.  Work with your health care provider or diet and nutrition specialist (dietitian) to adjust your eating plan to your individual calorie needs. This information is not intended to replace advice given to you by your health care provider. Make sure you discuss any questions you have with your health care provider. Document Released: 01/14/2011 Document Revised: 01/07/2017 Document Reviewed: 01/19/2016 Elsevier Patient Education  2020 [employer].

## 2020-02-28 NOTE — Progress Notes (Signed)
Hypertension Clinic Initial Assessment:    Date:  02/28/2020   ID:  [name] [name], [name] [name] [birth year], MRN [id number]  PCP:  Rosita Fire, MD  Cardiologist:  No primary care provider on file.  Nephrologist:  Referring MD: Rosita Fire, MD   CC: Hypertension  History of Present Illness:    [name] [name] is a 75 y.o. female with a hx of CAD, NSTEMI, hypertension, hyperlipidemia, diabetes, and OSA here to establish care in the hypertension clinic.  She was first diagnosed with hypertension in 1980 when she had her last child.  It has been elevated ever since.  Initially it was well-controlled.  For the last five years it has been more difficult to control.  She previously had an NSTEMI 09/2013.  She has a distal occluded OM1 that was medically managed.  Echo at that time revealed LVEF 55 to 60%.  She had a stress test 09/2015 that was low risk.  She has not tolerated amlodipine, Ranexa, Imdur, rosuvastatin, or pravastatin.  She was previously a patient of Dr. Bronson Ing and more recently has been seen by Katina Dung, NP.  It was noted that she was sporadically taking chlorthalidone only when having headaches or volume overload.  She also only has been taking metoprolol every other day.  She does take clonidine as prescribed.  She was started on hydralazine but it was discontinued due to headaches.  She has been seen in the ED 01/2020 with chest pain.  Cardiac enzymes were negative at that time.  She was previously referred to stress testing but declined the COVID test that was needed to have stress test.  Her stress test is again pending.  She gets headaches when her BP is high.  She doesn't eat meat and doesn't add salt and cooks at home.  She doesn't drink caffeine or EtoH.  She doesn't smoke.  She has no LE edema, orthopnea or PND.  She does not get much formal exercise but is very active at home.  She has no exertional chest pain.  Her chest discomfort was previously thought to be due to  costochondritis.  She was prescribed Voltaren but has not been taking it due to concern that the label says it can cause heart attacks and strokes.  She has adopted 2 children who came from family struggling with drug addiction.  This has been very stressful and rewarding for her.  Both children are now doing very well.  She also has another child who has been incarcerated.  She thinks these life stressors having contributed to her hypertension.  She snores but this has lessened since she has lost some weight.  She uses ibuprofen lately because she has 2 teeth that were infected and need extraction.  Current medication timing: 7-8 am:  Chlorthalidone Levothyroxine  10AM:  Metoprolol Clonidine  11AM-1PM Clonidine   Previous antihypertensives: Amlodipine- swelling Lisinopril- unknown Imdur Hydralazine- headaches   Past Medical History:  Diagnosis Date  . Anxiety   . Heart attack ([place])   . History of echocardiogram    a. 2D ECHO: 09/21/2013; EF 55-60%; mild concentric hypertrophy, G1DD, mild LA dilation, PA pressure 37. Small RV pericardial effusion  . Hypertension   . Hypothyroidism   . NSTEMI (non-ST elevated myocardial infarction) ([place])    a.  Related to occlusion of the distal Cx. The mid to distal obtuse marginal #1 also contains a significant stenosis that is best treated with medical therapy. Patent RCA/LAD with tortuosity. LM patent. EF 55%.   [name]  Sleep apnea   . Thyroid disease     Past Surgical History:  Procedure Laterality Date  . ABDOMINAL HYSTERECTOMY    . CARDIAC CATHETERIZATION  ~ 2000  . CORONARY ANGIOPLASTY  09/20/2013  . LEFT HEART CATHETERIZATION WITH CORONARY ANGIOGRAM N/A 09/20/2013   Procedure: LEFT HEART CATHETERIZATION WITH CORONARY ANGIOGRAM;  Surgeon: Sinclair Grooms, MD;  Location: [facility] CATH LAB;  Service: Cardiovascular;  Laterality: N/A;  . LEFT HEART CATHETERIZATION WITH CORONARY ANGIOGRAM N/A 09/27/2013   Procedure: LEFT HEART CATHETERIZATION WITH  CORONARY ANGIOGRAM;  Surgeon: Blane Ohara, MD;  Location: [facility] CATH LAB;  Service: Cardiovascular;  Laterality: N/A;  . THYROIDECTOMY  10/08/2011   Procedure: THYROIDECTOMY;  Surgeon: [name], MD;  Location: [facility] ORS;  Service: General;  Laterality: N/A;  Total  . VAGINAL HYSTERECTOMY  1993    Current Medications: Current Meds  Medication Sig  . carvedilol (COREG) 25 MG tablet Take 1 tablet (25 mg total) by mouth 2 (two) times daily.  . chlorthalidone (HYGROTON) 25 MG tablet TAKE 1 TABLET BY MOUTH DAILY  . cloNIDine (CATAPRES) 0.1 MG tablet Take 0.1 mg by mouth every 12 (twelve) hours.  . diclofenac Sodium (VOLTAREN) 1 % GEL Apply 2 g topically 4 (four) times daily as needed.  [name] levothyroxine (SYNTHROID) 125 MCG tablet Take 125 mcg by mouth daily before breakfast. 1 HOUR PRIOR TO OTHER MEDICATIONS  . nitroGLYCERIN (NITROSTAT) 0.4 MG SL tablet TAKE 1 TABLET UNDER THE TONGUE EVERY 5 MINS FOR CHEST PAIN - AFTER 2 DOSES GO TO ED  . [DISCONTINUED] cloNIDine (CATAPRES) 0.1 MG tablet Take 1 tablet (0.1 mg total) by mouth 2 (two) times daily.  . [DISCONTINUED] levothyroxine (SYNTHROID, LEVOTHROID) 125 MCG tablet Take 125 mcg by mouth daily.   . [DISCONTINUED] metoprolol succinate (TOPROL-XL) 100 MG 24 hr tablet Take 1 tablet (100 mg total) by mouth daily.     Allergies:   Fluviral [influenza virus vaccine], Amlodipine, Lisinopril, and Ranexa [ranolazine]   Social History   Socioeconomic History  . Marital status: Widowed    Spouse name: Not on file  . Number of children: Not on file  . Years of education: Not on file  . Highest education level: Not on file  Occupational History  . Not on file  Tobacco Use  . Smoking status: Never Smoker  . Smokeless tobacco: Never Used  Vaping Use  . Vaping Use: Never used  Substance and Sexual Activity  . Alcohol use: No    Alcohol/week: 0.0 standard drinks  . Drug use: No  . Sexual activity: Never  Other Topics Concern  . Not on file   Social History Narrative   ** Merged History Encounter **  Lives at home with family.  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      Social Determinants of Health   Financial Resource Strain: Not on file  Food Insecurity: Not on file  Transportation Needs: Not on file  Physical Activity: Not on file  Stress: Not on file  Social Connections: Not on file     Family History: The patient's family history includes Colon cancer in her father.  ROS:   Please see the history of present illness.    All other systems reviewed and are negative.  EKGs/Labs/Other Studies Reviewed:    EKG:  EKG is not ordered today.  The ekg ordered 01/23/2020 demonstrates Sinus rhythm.  Rate 75 bpm.  Left axis deviation.  LVH with secondary repolarization abnormality.  Prior anteroseptal infarct.  Recent Labs: 09/04/2019: ALT 23; TSH 4.301 01/23/2020: BUN 16; Creatinine, Ser 1.01; Hemoglobin 15.5; Platelets 550; Potassium 3.6; Sodium 139   Recent Lipid Panel    Component Value Date/Time   CHOL 194 09/04/2019 1216   TRIG 63 09/04/2019 1216   HDL 54 09/04/2019 1216   CHOLHDL 3.6 09/04/2019 1216   VLDL 13 09/04/2019 1216   LDLCALC 127 (H) 09/04/2019 1216    Physical Exam:    VS:  BP (!) 210/112 (BP Location: Right Arm)   Pulse 71   Ht 5\' 3"  (1.6 m)   Wt 185 lb (83.9 kg)   SpO2 98%   BMI 32.77 kg/m  , BMI Body mass index is 32.77 kg/m. GENERAL:  Well appearing HEENT: Pupils equal round and reactive, fundi not visualized, oral mucosa unremarkable NECK:  No jugular venous distention, waveform within normal limits, carotid upstroke brisk and symmetric, no bruits LUNGS:  Clear to auscultation bilaterally HEART:  RRR.  PMI not displaced or sustained,S1 and S2 within normal limits, no S3, no S4, no clicks, no rubs, no murmurs ABD:  Flat, positive bowel sounds normal in frequency in pitch, no bruits, no rebound, no guarding, no midline pulsatile mass, no hepatomegaly, no splenomegaly EXT:  2 plus pulses  throughout, no edema, no cyanosis no clubbing SKIN:  No rashes no nodules NEURO:  Cranial nerves II through XII grossly intact, motor grossly intact throughout PSYCH:  Cognitively intact, oriented to person place and time   ASSESSMENT:    1. Essential hypertension   2. CAD in native artery   3. NSTEMI (non-ST elevated myocardial infarction) (HCC)   4. Statin myopathy   5. Precordial pain     PLAN:    # Essential hypertension: [name] [name]'s blood pressure has been poorly controlled.  She struggles with intolerances to multiple medications.  We also had a discussion about the importance of taking the medications as prescribed and on time.  She understands that clonidine can cause a rebound and should be taken approximately 12 hours apart.  She will start doing this.  We will also switch metoprolol to carvedilol 25 mg twice daily.  Continue chlorthalidone.  I suspect her blood pressure will remain above goal.  However given her intolerances we will only make 1 change at a time.  Clonidine can certainly be increased or switched to a patch.  It is unclear what her ACE inhibitor allergy was.  Could consider trying an ARB if it was not angioedema.  Could also consider adding spironolactone.  She is interested in enrolling in the PREP exercise and nutrition program through the [facility].  We will have her see our care guide for stress management.  Secondary Causes of Hypertension  Medications/Herbal: OCP, steroids, stimulants, antidepressants, weight loss medication, immune suppressants, NSAIDs, sympathomimetics, alcohol, caffeine, licorice, ginseng, St. John's wort, chemo  Sleep Apnea: declines repeat testing Renal artery stenosis: Check renal artery Doppler Hyperaldosteronism: Check renin/aldosterone Hyper/hypothyroidism: TSH normal 08/2019 Pheochromocytoma: (testing not indicated) Cushing's syndrome: (testing not indicated) Coarctation of the aorta: (testing not indicated)  # CAD: #  Hyperlipidemia: Statin intolerance.  Stress test pending.  Consider PCSK9 inhibitor.  However given her multiple medication intolerances we will only admit 1 medicine at a time.  Disposition:    FU with MD/PharmD in 1 month    Medication Adjustments/Labs and Tests Ordered: Current medicines are reviewed at length with the patient today.  Concerns regarding medicines are outlined above.  Orders Placed This Encounter  Procedures  . Aldosterone +  renin activity w/ ratio  . VAS US RENAL ARTERY DUPLEX   Meds ordered this encounter  Medications  . carvedilol (COREG) 25 MG tablet    Sig: Take 1 tablet (25 mg total) by mouth 2 (two) times daily.    Dispense:  180 tablet    Refill:  1    D/C METOPROLOL     Signed, Skeet Latch, MD  02/28/2020 10:30 AM    [place]

## 2020-02-29 ENCOUNTER — Telehealth: Payer: Self-pay

## 2020-02-29 NOTE — Telephone Encounter (Signed)
Attempted to reach pt reference PREP referral Phone listed sts mailbox is full and unable to leave a message No other phone number listed Will retry at another date

## 2020-03-03 LAB — ALDOSTERONE + RENIN ACTIVITY W/ RATIO: ALDOSTERONE: 7.9 ng/dL (ref 0.0–30.0)

## 2020-03-04 ENCOUNTER — Telehealth: Payer: Self-pay

## 2020-03-04 DIAGNOSIS — Z0000 Encounter for general adult medical examination without abnormal findings: Secondary | ICD-10-CM

## 2020-03-04 DIAGNOSIS — H16203 Unspecified keratoconjunctivitis, bilateral: Secondary | ICD-10-CM | POA: Diagnosis not present

## 2020-03-04 NOTE — Telephone Encounter (Signed)
Called patient per Dr. Oval Linsey to discuss health coaching for stress management. Patient is also interested in improving healthy eating behaviors. Patient has been scheduled for initial health coaching session on 03/11/20 at 11am.

## 2020-03-06 ENCOUNTER — Telehealth: Payer: Self-pay

## 2020-03-06 ENCOUNTER — Telehealth: Payer: Self-pay | Admitting: Cardiovascular Disease

## 2020-03-06 DIAGNOSIS — Z0000 Encounter for general adult medical examination without abnormal findings: Secondary | ICD-10-CM

## 2020-03-06 NOTE — Telephone Encounter (Signed)
New Message:    Pt wants to know if she need to keep her appointment on 03-11-20. She said she had already talked to the Nurse.

## 2020-03-06 NOTE — Telephone Encounter (Signed)
I have called the patient and sorted out the confusion. She has a telephone health coaching session on 2/1 at 11:00am.

## 2020-03-06 NOTE — Telephone Encounter (Signed)
Called patient to update her that she has an appointment with Martin for health coaching on 2/1 at 11:00am over the phone. Patient was confused because she thought it was on office visit. Patient is aware of process for her upcoming appointment.

## 2020-03-11 ENCOUNTER — Telehealth: Payer: Self-pay

## 2020-03-11 ENCOUNTER — Other Ambulatory Visit: Payer: Self-pay

## 2020-03-11 ENCOUNTER — Ambulatory Visit (INDEPENDENT_AMBULATORY_CARE_PROVIDER_SITE_OTHER): Payer: Medicare HMO

## 2020-03-11 DIAGNOSIS — Z0000 Encounter for general adult medical examination without abnormal findings: Secondary | ICD-10-CM

## 2020-03-11 NOTE — Telephone Encounter (Signed)
error 

## 2020-03-11 NOTE — Progress Notes (Signed)
Appointment Outcome:  Completed, Session #: Initial Health Coaching Session  AGREEMENTS SECTION   Overall Goal(s): Stress management                                             Agreement/Action Steps:  N/A  Progress Notes:  Patient was provided an overview of health coaching and the Code of Ethics. Patient had no questions at this time. Patient had been mailed a copy of the coaching agreement to sign and return. Patient stated that she mailed the copy off today.  During this session, the patient stated that she's only concerned with Covid and the health of her sister. Patient husband expired 20 years ago and have been able to cope with that. Patient stated that outside of these concerns, she really doesn't have any stressors. Patient stated that she does engage in healthy eating behaviors despite the cost of food such as eating fish 3x/wk, leafy vegetables, beans, oatmeal, and salads. Patient stated she drinks lots of water but must monitor it because of her potassium levels. Patient stated that she doesn't drink coffee and seldomly drink sweet tea. Patient stated that she eats dried dates for sweets.   Patient also engages in cardio and toning exercises at home every morning. She reported that she has recently lost 4 lbs.    Coaching Outcomes: Patient was asked if she felt that she could benefit from health coaching at this time. Patient stated that she is fine and does not need health coaching at this time but will be in contact to address any stressors that may arise.  Patient is aware of goals that health coaching can assist her with including stress management. Patient has the Care Guide's contact information [phone]) to call if she does become interested in health coaching for any reason.

## 2020-03-12 ENCOUNTER — Telehealth: Payer: Self-pay | Admitting: *Deleted

## 2020-03-12 DIAGNOSIS — I10 Essential (primary) hypertension: Secondary | ICD-10-CM

## 2020-03-12 NOTE — Telephone Encounter (Signed)
-----   Message from Skeet Latch, MD sent at 03/09/2020 12:16 PM EST ----- Sample was insufficient.  Needs to be repeated.

## 2020-03-12 NOTE — Telephone Encounter (Signed)
Discussed with Sasha and will be redrawn  Left message to call back

## 2020-03-14 NOTE — Telephone Encounter (Signed)
Spoke with patient and she will return soon for labs

## 2020-03-20 ENCOUNTER — Telehealth: Payer: Self-pay | Admitting: Cardiovascular Disease

## 2020-03-20 NOTE — Telephone Encounter (Signed)
    Pt would like to ask Dr. Oval Linsey if she can get her lab work at [facility]

## 2020-03-21 NOTE — Telephone Encounter (Signed)
Advised patient ok to go to [place] across from [facility]

## 2020-03-31 DIAGNOSIS — I10 Essential (primary) hypertension: Secondary | ICD-10-CM | POA: Diagnosis not present

## 2020-04-02 ENCOUNTER — Ambulatory Visit: Payer: Medicare HMO

## 2020-04-02 ENCOUNTER — Telehealth: Payer: Self-pay

## 2020-04-02 ENCOUNTER — Telehealth: Payer: Self-pay | Admitting: Licensed Clinical Social Worker

## 2020-04-02 NOTE — Progress Notes (Deleted)
Patient ID: [name] [name]                 DOB: February 13, 1945                      MRN: [id number]     HPI: [name] [name] is a 75 y.o. female referred by Dr. Maryelizabeth Kaufmann to Adv HTN clinic. PMH includes hypertension, hyperlipidemia, NSTEMI, sleep apnea, hypothyroidism, anxiety, and compliance issues. Patient was referred to Avelino Leeds (1st televisit on Feb/1) for health coaching and stress management. Winifred Olive RN tried (unsuscefully) to reach patient on January/21. Registration to [place] program still pending. Renal duplex scheduled for 04/17/2020 at [facility].   *Aldosterone blood work done?? [employer] spironolactone 12.5mg  daily? *START valsartan 80mg  daily? *Decrease clonidine to 1/2 tablet every 12 hours*  Current HTN meds:  Carvedilol 25mg  twice daily Chlorthalidone 25mg  daily Clonidine 0.1mg  every 12 hours   Previously tried:   Amlodipine - swelling Lisinopril - unknown  BP goal: <140/90  Family History:  family history includes Colon cancer in her father.  Social History: denies tobacco or alcohol use  Diet:   Exercise:   Home BP readings:   Wt Readings from Last 3 Encounters:  02/28/20 185 lb (83.9 kg)  01/29/20 182 lb 3.2 oz (82.6 kg)  01/23/20 174 lb (78.9 kg)   BP Readings from Last 3 Encounters:  02/28/20 (!) 210/112  01/29/20 (!) 210/130  01/23/20 (!) 199/119   Pulse Readings from Last 3 Encounters:  02/28/20 71  01/29/20 64  01/23/20 65    Renal function: CrCl cannot be calculated (Patient's most recent lab result is older than the maximum 21 days allowed.).  Past Medical History:  Diagnosis Date  . Anxiety   . Heart attack ([place])   . History of echocardiogram    a. 2D ECHO: 09/21/2013; EF 55-60%; mild concentric hypertrophy, G1DD, mild LA dilation, PA pressure 37. Small RV pericardial effusion  . Hypertension   . Hypothyroidism   . NSTEMI (non-ST elevated myocardial infarction) ([place])    a.  Related to occlusion of the distal Cx. The mid to  distal obtuse marginal #1 also contains a significant stenosis that is best treated with medical therapy. Patent RCA/LAD with tortuosity. LM patent. EF 55%.   . Sleep apnea   . Thyroid disease     Current Outpatient Medications on File Prior to Visit  Medication Sig Dispense Refill  . carvedilol (COREG) 25 MG tablet Take 1 tablet (25 mg total) by mouth 2 (two) times daily. 180 tablet 1  . chlorthalidone (HYGROTON) 25 MG tablet TAKE 1 TABLET BY MOUTH DAILY 90 tablet 1  . cloNIDine (CATAPRES) 0.1 MG tablet Take 0.1 mg by mouth every 12 (twelve) hours.    . diclofenac Sodium (VOLTAREN) 1 % GEL Apply 2 g topically 4 (four) times daily as needed. 50 g 0  . levothyroxine (SYNTHROID) 125 MCG tablet Take 125 mcg by mouth daily before breakfast. 1 HOUR PRIOR TO OTHER MEDICATIONS    . nitroGLYCERIN (NITROSTAT) 0.4 MG SL tablet TAKE 1 TABLET UNDER THE TONGUE EVERY 5 MINS FOR CHEST PAIN - AFTER 2 DOSES GO TO ED 25 tablet 3   No current facility-administered medications on file prior to visit.    Allergies  Allergen Reactions  . Fluviral [Influenza Virus Vaccine] Shortness Of Breath, Swelling and Other (See Comments)    Including back pain that radiates in upper extremeties  . Amlodipine     Lip  swelling per patient.   . Lisinopril     Dr. Legrand Rams stopped due to side effects per patient.  Could remember what side effects were.    [name] [Ranolazine]     Chest pain    There were no vitals taken for this visit.  No problem-specific Assessment & Plan notes found for this encounter.    Annaleah Arata Rodriguez-Guzman PharmD, BCPS, [name] [id number] 04/02/2020 10:24 AM

## 2020-04-02 NOTE — Telephone Encounter (Signed)
Called and spoke w/pt regarding missing their appt. Pt stated they do not have enough gas and was offered transportation services by isabel chase and the pt declined services and we rescheduled appt. Pt voiced understanding

## 2020-04-02 NOTE — Telephone Encounter (Signed)
Pt referred for assistance w/ Chiropractor by [place], [place]. Spoke with pt on the telephone; introduced self, role, reason for call. Discussed [employer] program. Pt declines at this time. Attempted to assess additional financial barriers, pt states she will "figure it out." LCSW remains available as needed, [place] aware of pt declining services.   [name] [name], MSW, [place]  [phone]

## 2020-04-04 LAB — ALDOSTERONE + RENIN ACTIVITY W/ RATIO
ALDOS/RENIN RATIO: >79.6 — ABNORMAL HIGH (ref 0.0–30.0)
ALDOSTERONE: 13.3 ng/dL (ref 0.0–30.0)
Renin: <0.167 ng/mL/hr — ABNORMAL LOW (ref 0.167–5.380)

## 2020-04-21 ENCOUNTER — Telehealth: Payer: Self-pay | Admitting: *Deleted

## 2020-04-21 NOTE — Telephone Encounter (Signed)
Tried to call patient and schedule ADV HTN follow up  Mailbox full, will try again when back in office

## 2020-04-22 ENCOUNTER — Ambulatory Visit: Payer: Medicare HMO

## 2020-04-24 ENCOUNTER — Ambulatory Visit (INDEPENDENT_AMBULATORY_CARE_PROVIDER_SITE_OTHER): Payer: Medicare HMO

## 2020-04-24 DIAGNOSIS — I10 Essential (primary) hypertension: Secondary | ICD-10-CM

## 2020-04-28 ENCOUNTER — Telehealth: Payer: Self-pay | Admitting: Cardiology

## 2020-04-28 DIAGNOSIS — J0101 Acute recurrent maxillary sinusitis: Secondary | ICD-10-CM | POA: Diagnosis not present

## 2020-04-28 DIAGNOSIS — I10 Essential (primary) hypertension: Secondary | ICD-10-CM | POA: Diagnosis not present

## 2020-04-28 DIAGNOSIS — H04123 Dry eye syndrome of bilateral lacrimal glands: Secondary | ICD-10-CM | POA: Diagnosis not present

## 2020-04-28 NOTE — Telephone Encounter (Signed)
Pt came into office stating she was seen at Urgent Care and her BP was 178/115.

## 2020-04-28 NOTE — Telephone Encounter (Signed)
Pt went to Urgent care in [facility] for sinus infection and was given amoxicillin says BP was 178/115 at that time - pt was in pain with headache - pt will call and schedule apt with Dr Oval Linsey for f/u on BP - says she was supposed to f/u in February and has a lot going on an missed her last appt - pt says she will continue to monitor BP as she says has gone down but did not record reading

## 2020-05-06 ENCOUNTER — Telehealth: Payer: Self-pay | Admitting: Cardiovascular Disease

## 2020-05-06 NOTE — Telephone Encounter (Signed)
New message:    Patient calling statin that she was returning a call back. Did not see a note, however patient did not listen to voice mail. Please advice.

## 2020-05-06 NOTE — Telephone Encounter (Signed)
Advised patient of results.  

## 2020-05-06 NOTE — Telephone Encounter (Signed)
-----   Message from Skeet Latch, MD sent at 05/02/2020 12:25 PM EDT ----- Normal blood flow in the arteries to both kidneys.

## 2020-05-06 NOTE — Telephone Encounter (Signed)
Spoke with patient and she does not want to schedule anything at this time  Patient depends on transportation  and will call back

## 2020-05-09 ENCOUNTER — Encounter: Payer: Self-pay | Admitting: Cardiology

## 2020-05-09 DIAGNOSIS — Z79890 Hormone replacement therapy (postmenopausal): Secondary | ICD-10-CM | POA: Diagnosis not present

## 2020-05-09 DIAGNOSIS — R059 Cough, unspecified: Secondary | ICD-10-CM | POA: Diagnosis not present

## 2020-05-09 DIAGNOSIS — E039 Hypothyroidism, unspecified: Secondary | ICD-10-CM | POA: Diagnosis not present

## 2020-05-09 DIAGNOSIS — I517 Cardiomegaly: Secondary | ICD-10-CM | POA: Diagnosis not present

## 2020-05-09 DIAGNOSIS — Z9049 Acquired absence of other specified parts of digestive tract: Secondary | ICD-10-CM | POA: Diagnosis not present

## 2020-05-09 DIAGNOSIS — Z79899 Other long term (current) drug therapy: Secondary | ICD-10-CM | POA: Diagnosis not present

## 2020-05-09 DIAGNOSIS — R519 Headache, unspecified: Secondary | ICD-10-CM | POA: Diagnosis not present

## 2020-05-09 DIAGNOSIS — E0590 Thyrotoxicosis, unspecified without thyrotoxic crisis or storm: Secondary | ICD-10-CM | POA: Diagnosis not present

## 2020-05-09 DIAGNOSIS — I498 Other specified cardiac arrhythmias: Secondary | ICD-10-CM | POA: Diagnosis not present

## 2020-05-09 DIAGNOSIS — Q2546 Tortuous aortic arch: Secondary | ICD-10-CM | POA: Diagnosis not present

## 2020-05-09 DIAGNOSIS — T50905A Adverse effect of unspecified drugs, medicaments and biological substances, initial encounter: Secondary | ICD-10-CM | POA: Diagnosis not present

## 2020-05-09 DIAGNOSIS — T483X5A Adverse effect of antitussives, initial encounter: Secondary | ICD-10-CM | POA: Diagnosis not present

## 2020-05-09 NOTE — Progress Notes (Deleted)
Cardiology Office Note  Date: 05/09/2020   ID: Adalea, Handler 03-04-1945, MRN [id number]  PCP:  Rosita Fire, MD  Cardiologist:  No primary care provider on file. Electrophysiologist:  None   No chief complaint on file.   History of Present Illness: [name] [name] is a 75 y.o. female former patient of Dr. Bronson Ing now presenting to establish follow-up with me.  I reviewed her records and updated the chart.  She was last seen in January of this year by Dr. Oval Linsey for management of poorly controlled hypertension.  I reviewed the chart and associated testing.  Past Medical History:  Diagnosis Date  . Anxiety   . Essential hypertension   . Hypothyroidism   . NSTEMI (non-ST elevated myocardial infarction) ([place])    a.  Related to occlusion of the distal Cx. The mid to distal obtuse marginal #1 also contains a significant stenosis that is best treated with medical therapy. Patent RCA/LAD with tortuosity. LM patent. EF 55%.   . Sleep apnea     Past Surgical History:  Procedure Laterality Date  . ABDOMINAL HYSTERECTOMY    . CARDIAC CATHETERIZATION  ~ 2000  . CORONARY ANGIOPLASTY  09/20/2013  . LEFT HEART CATHETERIZATION WITH CORONARY ANGIOGRAM N/A 09/20/2013   Procedure: LEFT HEART CATHETERIZATION WITH CORONARY ANGIOGRAM;  Surgeon: Sinclair Grooms, MD;  Location: [facility] CATH LAB;  Service: Cardiovascular;  Laterality: N/A;  . LEFT HEART CATHETERIZATION WITH CORONARY ANGIOGRAM N/A 09/27/2013   Procedure: LEFT HEART CATHETERIZATION WITH CORONARY ANGIOGRAM;  Surgeon: Blane Ohara, MD;  Location: [facility] CATH LAB;  Service: Cardiovascular;  Laterality: N/A;  . THYROIDECTOMY  10/08/2011   Procedure: THYROIDECTOMY;  Surgeon: [name], MD;  Location: [facility] ORS;  Service: General;  Laterality: N/A;  Total  . VAGINAL HYSTERECTOMY  1993    Current Outpatient Medications  Medication Sig Dispense Refill  . carvedilol (COREG) 25 MG tablet Take 1 tablet (25 mg total) by mouth 2  (two) times daily. 180 tablet 1  . chlorthalidone (HYGROTON) 25 MG tablet TAKE 1 TABLET BY MOUTH DAILY 90 tablet 1  . cloNIDine (CATAPRES) 0.1 MG tablet Take 0.1 mg by mouth every 12 (twelve) hours.    . diclofenac Sodium (VOLTAREN) 1 % GEL Apply 2 g topically 4 (four) times daily as needed. 50 g 0  . levothyroxine (SYNTHROID) 125 MCG tablet Take 125 mcg by mouth daily before breakfast. 1 HOUR PRIOR TO OTHER MEDICATIONS    . nitroGLYCERIN (NITROSTAT) 0.4 MG SL tablet TAKE 1 TABLET UNDER THE TONGUE EVERY 5 MINS FOR CHEST PAIN - AFTER 2 DOSES GO TO ED 25 tablet 3   No current facility-administered medications for this visit.   Allergies:  Fluviral [influenza virus vaccine], Amlodipine, Lisinopril, and Ranexa [ranolazine]   Social History: The patient  reports that she has never smoked. She has never used smokeless tobacco. She reports that she does not drink alcohol and does not use drugs.   Family History: The patient's family history includes Colon cancer in her father.   ROS:  Please see the history of present illness. Otherwise, complete review of systems is positive for {NONE DEFAULTED:18576::"none"}.  All other systems are reviewed and negative.   Physical Exam: VS:  There were no vitals taken for this visit., BMI There is no height or weight on file to calculate BMI.  Wt Readings from Last 3 Encounters:  02/28/20 185 lb (83.9 kg)  01/29/20 182 lb 3.2 oz (82.6 kg)  01/23/20 174 lb (78.9 kg)    General: Patient appears comfortable at rest. HEENT: Conjunctiva and lids normal, oropharynx clear with moist mucosa. Neck: Supple, no elevated JVP or carotid bruits, no thyromegaly. Lungs: Clear to auscultation, nonlabored breathing at rest. Cardiac: Regular rate and rhythm, no S3 or significant systolic murmur, no pericardial rub. Abdomen: Soft, nontender, no hepatomegaly, bowel sounds present, no guarding or rebound. Extremities: No pitting edema, distal pulses 2+. Skin: Warm and  dry. Musculoskeletal: No kyphosis. Neuropsychiatric: Alert and oriented x3, affect grossly appropriate.  ECG:  An ECG dated 01/23/2020 was personally reviewed today and demonstrated:  Sinus rhythm with left atrial enlargement and LVH.  Recent Labwork: 09/04/2019: ALT 23; AST 27; TSH 4.301 01/23/2020: BUN 16; Creatinine, Ser 1.01; Hemoglobin 15.5; Platelets 550; Potassium 3.6; Sodium 139     Component Value Date/Time   CHOL 194 09/04/2019 1216   TRIG 63 09/04/2019 1216   HDL 54 09/04/2019 1216   CHOLHDL 3.6 09/04/2019 1216   VLDL 13 09/04/2019 1216   LDLCALC 127 (H) 09/04/2019 1216    Other Studies Reviewed Today:  Cardiac catheterization 09/27/2013: Left mainstem: The left mainstem is patent without significant obstruction. Divides into the LAD and left circumflex.  Left anterior descending (LAD): The LAD is a large-caliber vessel through its proximal portion. There is a large first diagonal branch. There is no obstructive disease in the proximal LAD her first diagonal. The vessel trifurcates in the midportion into a medium caliber second diagonal, large septal perforator, and LAD segment it reaches the apex. There is mild diffuse disease noted throughout the mid LAD but there is no significant obstruction.  Left circumflex (LCx): The left circumflex has mild proximal stenosis. There is a first obtuse marginal branch with marked tortuosity and diffuse 80% stenosis leading into a subtotal occlusion with 99% stenosis and TIMI 1 flow beyond the area of occlusion. The AV circumflex extends down and supplies a second OM branch without significant disease.  Right coronary artery (RCA): The RCA is dominant. The large vessels supplying the acute marginal, PDA, and then small PLA branch. The vessel has no significant angiographic disease.  Left ventriculography: There is mild hypokinesis of the mid anterolateral wall. The other LV segments contract normally. The estimated LVEF is  65%.  Estimated Blood Loss: Minimal  Final Conclusions:   1. Non-ST elevation infarction secondary to subtotal occlusion of a medium caliber, highly tortuous obtuse marginal branch of the left circumflex.  2. Nonobstructive LAD stenosis(mild)  3. Widely patent dominant right coronary artery  4. Mild segmental contraction abnormality the left ventricle with preserved overall LV systolic function  Echocardiogram 09/21/2013: - Left ventricle: The cavity size was normal. There was mild  concentric hypertrophy. Systolic function was normal. The  estimated ejection fraction was in the range of 55% to 60%. Wall  motion was normal; there were no regional wall motion  abnormalities. Doppler parameters are consistent with abnormal  left ventricular relaxation (grade 1 diastolic dysfunction).  - Left atrium: The atrium was mildly dilated.  - Pulmonary arteries: Systolic pressure was mildly increased. PA  peak pressure: 37 mm Hg (S).  - Pericardium, extracardiac: A small pericardial effusion was  identified along the right ventricular free wall.   Renal artery Dopplers 04/24/2020: Renal:    Right: Normal size right kidney. Normal cortical thickness of right     kidney. Normal right Resisitive Index. No evidence of right     renal artery stenosis. RRV flow present.  Left: Normal size  of left kidney. Normal left Resistive Index.     Normal cortical thickness of the left kidney. No evidence of     left renal artery stenosis. LRV flow present.  Mesenteric:  Normal Celiac artery and Superior Mesenteric artery findings.  IVC is patent.   Assessment and Plan:    Medication Adjustments/Labs and Tests Ordered: Current medicines are reviewed at length with the patient today.  Concerns regarding medicines are outlined above.   Tests Ordered: No orders of the defined types were placed in this encounter.   Medication Changes: No orders of the defined types  were placed in this encounter.   Disposition:  Follow up {follow up:15908}  Signed, Satira Sark, MD, [facility] 05/09/2020 10:40 AM    [place] [facility] [place], [place], [place] [address] Phone: [phone]; Fax: [phone]

## 2020-05-12 ENCOUNTER — Ambulatory Visit: Payer: Medicare HMO | Admitting: Cardiology

## 2020-05-12 DIAGNOSIS — I10 Essential (primary) hypertension: Secondary | ICD-10-CM

## 2020-06-12 DIAGNOSIS — H2511 Age-related nuclear cataract, right eye: Secondary | ICD-10-CM | POA: Diagnosis not present

## 2020-07-08 ENCOUNTER — Ambulatory Visit: Payer: Medicare HMO | Admitting: Cardiovascular Disease

## 2020-07-14 DIAGNOSIS — J069 Acute upper respiratory infection, unspecified: Secondary | ICD-10-CM | POA: Diagnosis not present

## 2020-07-14 NOTE — Progress Notes (Signed)
Cardiology Office Note  Date: 07/15/2020   ID: [name], [name] 1945/05/05, MRN [id number]  PCP:  Rosita Fire, MD  Cardiologist:  [name], MD Electrophysiologist:  None   Chief Complaint  Patient presents with  . Cardiac follow-up    History of Present Illness: [name] [name] is a 75 y.o. female former patient of Dr. Bronson Ing now presenting to establish follow-up with me.  I reviewed her records and updated the chart.  She was last seen in January by Dr. Oval Linsey in the hypertension clinic.  She presents today for follow-up.  Blood pressure significantly elevated and in similar range to the last several visits.  We went over her medications.  She states that she has been taking everything regularly including Coreg, chlorthalidone, and clonidine.  She does have a number of medication intolerances documented previously.  No exertional chest pain.  Recent episode of apparent pleurisy with intermittent cough.  Past Medical History:  Diagnosis Date  . Anxiety   . Essential hypertension   . Hypothyroidism   . NSTEMI (non-ST elevated myocardial infarction) ([place]) 2015   a.  Related to occlusion of the distal Cx. The mid to distal OM1 also contains a significant stenosis that is best treated with medical therapy. Patent RCA/LAD with tortuosity. LM patent. EF 55%.   . Sleep apnea     Past Surgical History:  Procedure Laterality Date  . ABDOMINAL HYSTERECTOMY    . CARDIAC CATHETERIZATION  ~ 2000  . CORONARY ANGIOPLASTY  09/20/2013  . LEFT HEART CATHETERIZATION WITH CORONARY ANGIOGRAM N/A 09/20/2013   Procedure: LEFT HEART CATHETERIZATION WITH CORONARY ANGIOGRAM;  Surgeon: Sinclair Grooms, MD;  Location: [facility] CATH LAB;  Service: Cardiovascular;  Laterality: N/A;  . LEFT HEART CATHETERIZATION WITH CORONARY ANGIOGRAM N/A 09/27/2013   Procedure: LEFT HEART CATHETERIZATION WITH CORONARY ANGIOGRAM;  Surgeon: Blane Ohara, MD;  Location: [facility] CATH LAB;  Service:  Cardiovascular;  Laterality: N/A;  . THYROIDECTOMY  10/08/2011   Procedure: THYROIDECTOMY;  Surgeon: [name], MD;  Location: [facility] ORS;  Service: General;  Laterality: N/A;  Total  . VAGINAL HYSTERECTOMY  1993    Current Outpatient Medications  Medication Sig Dispense Refill  . albuterol (ACCUNEB) 1.25 MG/3ML nebulizer solution SMARTSIG:3 Milliliter(s) By Mouth Every 6 Hours PRN    . chlorthalidone (HYGROTON) 25 MG tablet TAKE 1 TABLET BY MOUTH DAILY 90 tablet 1  . [START ON 07/16/2020] cloNIDine (CATAPRES - DOSED IN MG/24 HR) 0.3 mg/24hr patch Place 1 patch (0.3 mg total) onto the skin once a week. 4 patch 6  . diclofenac Sodium (VOLTAREN) 1 % GEL Apply 2 g topically 4 (four) times daily as needed. 50 g 0  . levothyroxine (SYNTHROID) 125 MCG tablet Take 125 mcg by mouth daily before breakfast. 1 HOUR PRIOR TO OTHER MEDICATIONS    . nitroGLYCERIN (NITROSTAT) 0.4 MG SL tablet TAKE 1 TABLET UNDER THE TONGUE EVERY 5 MINS FOR CHEST PAIN - AFTER 2 DOSES GO TO ED 25 tablet 3  . carvedilol (COREG) 25 MG tablet Take 1 tablet (25 mg total) by mouth 2 (two) times daily. 180 tablet 1   No current facility-administered medications for this visit.   Allergies:  Fluviral [influenza virus vaccine], Haemophilus influenzae, Amlodipine, Lisinopril, and Ranexa [ranolazine]   ROS: No palpitations or syncope.  Physical Exam: VS:  BP (!) 178/110   Pulse 64   Ht 5\' 3"  (1.6 m)   Wt 178 lb (80.7 kg)   SpO2 98%  BMI 31.53 kg/m , BMI Body mass index is 31.53 kg/m.  Wt Readings from Last 3 Encounters:  07/15/20 178 lb (80.7 kg)  02/28/20 185 lb (83.9 kg)  01/29/20 182 lb 3.2 oz (82.6 kg)    General: Patient appears comfortable at rest. HEENT: Wearing sunglasses, wearing a mask. Neck: Supple, no elevated JVP or carotid bruits, no thyromegaly. Lungs: Clear to auscultation, nonlabored breathing at rest. Cardiac: Regular rate and rhythm, no S3 or significant systolic murmur, no pericardial  rub. Extremities: No pitting edema.  ECG:  An ECG dated 01/23/2020 was personally reviewed today and demonstrated:  Sinus rhythm with left atrial enlargement, leftward axis and LVH.  Recent Labwork: 09/04/2019: ALT 23; AST 27; TSH 4.301 01/23/2020: BUN 16; Creatinine, Ser 1.01; Hemoglobin 15.5; Platelets 550; Potassium 3.6; Sodium 139     Component Value Date/Time   CHOL 194 09/04/2019 1216   TRIG 63 09/04/2019 1216   HDL 54 09/04/2019 1216   CHOLHDL 3.6 09/04/2019 1216   VLDL 13 09/04/2019 1216   LDLCALC 127 (H) 09/04/2019 1216    Other Studies Reviewed Today:  Echocardiogram 09/21/2013: - Left ventricle: The cavity size was normal. There was mild  concentric hypertrophy. Systolic function was normal. The  estimated ejection fraction was in the range of 55% to 60%. Wall  motion was normal; there were no regional wall motion  abnormalities. Doppler parameters are consistent with abnormal  left ventricular relaxation (grade 1 diastolic dysfunction).  - Left atrium: The atrium was mildly dilated.  - Pulmonary arteries: Systolic pressure was mildly increased. PA  peak pressure: 37 mm Hg (S).  - Pericardium, extracardiac: A small pericardial effusion was  identified along the right ventricular free wall.   Renal artery Dopplers 04/24/2020: Summary:  Largest Aortic Diameter: 2.3 cm    Renal:    Right: Normal size right kidney. Normal cortical thickness of right     kidney. Normal right Resisitive Index. No evidence of right     renal artery stenosis. RRV flow present.  Left: Normal size of left kidney. Normal left Resistive Index.     Normal cortical thickness of the left kidney. No evidence of     left renal artery stenosis. LRV flow present.  Mesenteric:  Normal Celiac artery and Superior Mesenteric artery findings.  IVC is patent.  Assessment and Plan:  1.  Essential hypertension, poorly controlled in the setting of medication  intolerances.  She is following with Dr. Oval Linsey in the hypertension clinic and has a visit pending.  Plan is to continue current doses of Coreg and chlorthalidone, switch clonidine to patch formulation at 0.3 mg weekly.  Next step would be addition of Aldactone most likely.  Renal artery Dopplers in March did not show evidence of renal artery stenosis.  2.  CAD with history of occluded distal circumflex.  She does not report any obvious angina at this time.  Has a history of statin intolerance, Zetia or PCSK9 inhibitor would be a reasonable next step, but she is generally hesitant to try new medications given her prior intolerances.  We will continue to address this over time.  She has as needed nitroglycerin available.  Medication Adjustments/Labs and Tests Ordered: Current medicines are reviewed at length with the patient today.  Concerns regarding medicines are outlined above.   Tests Ordered: No orders of the defined types were placed in this encounter.   Medication Changes: Meds ordered this encounter  Medications  . cloNIDine (CATAPRES - DOSED IN MG/24 HR) 0.3  mg/24hr patch    Sig: Place 1 patch (0.3 mg total) onto the skin once a week.    Dispense:  4 patch    Refill:  6    07/15/2020 NEW-stop clonidine 0.1 mg tablets    Disposition:  Follow up 3 months.  Signed, Satira Sark, MD, [facility] 07/15/2020 10:35 AM    [place] at [place], [place], [place] [address] Phone: [phone]; Fax: [phone]

## 2020-07-15 ENCOUNTER — Encounter: Payer: Self-pay | Admitting: Cardiology

## 2020-07-15 ENCOUNTER — Ambulatory Visit (INDEPENDENT_AMBULATORY_CARE_PROVIDER_SITE_OTHER): Payer: Medicare HMO | Admitting: Cardiology

## 2020-07-15 VITALS — BP 178/110 | HR 64 | Ht 63.0 in | Wt 178.0 lb

## 2020-07-15 DIAGNOSIS — I25119 Atherosclerotic heart disease of native coronary artery with unspecified angina pectoris: Secondary | ICD-10-CM

## 2020-07-15 DIAGNOSIS — I10 Essential (primary) hypertension: Secondary | ICD-10-CM

## 2020-07-15 MED ORDER — CLONIDINE 0.3 MG/24HR TD PTWK: 0.3000 mg | MEDICATED_PATCH | TRANSDERMAL | 6 refills | Status: DC

## 2020-07-15 NOTE — Patient Instructions (Addendum)
Medication Instructions:   Your physician has recommended you make the following change in your medication:   Stop clonidine 0.1 mg tablets after today  Start clonidine 0.3 mg patch weekly tomorrow  Continue other medications the same  Labwork:  none  Testing/Procedures:  none  Follow-Up:  Your physician recommends that you schedule a follow-up appointment in: 3 months.  Keep visit with Dr. Oval Linsey as planned.  Any Other Special Instructions Will Be Listed Below (If Applicable).  If you need a refill on your cardiac medications before your next appointment, please call your pharmacy.

## 2020-07-18 NOTE — Progress Notes (Signed)
Advanced Hypertension Clinic Follow-up:    Date:  07/21/2020   ID:  [name] [name], DOB [birth year], MRN [id number]  PCP:  Rosita Fire, MD  Cardiologist:  [name], MD  Nephrologist:  Referring MD: Rosita Fire, MD   CC: Hypertension  History of Present Illness:    [name] [name] is a 75 y.o. female with a hx of CAD, NSTEMI, hypertension, hyperlipidemia, diabetes, and OSA here for follow-up. She initially established care in the hypertension clinic 02/28/2020.  She was first diagnosed with hypertension in 1980 when she had her last child.  It has been elevated ever since.  Initially it was well-controlled.  For the last five years it has been more difficult to control.  She previously had an NSTEMI 09/2013.  She has a distal occluded OM1 that was medically managed.  Echo at that time revealed LVEF 55 to 60%.  She had a stress test 09/2015 that was low risk.  She has not tolerated amlodipine, Ranexa, Imdur, rosuvastatin, or pravastatin.  She was previously a patient of Dr. Bronson Ing and more recently has been seen by Katina Dung, NP.  It was noted that she was sporadically taking chlorthalidone only when having headaches or volume overload.  She also only has been taking metoprolol every other day.  She does take clonidine as prescribed.  She was started on hydralazine but it was discontinued due to headaches.  She has been seen in the ED 01/2020 with chest pain.  Cardiac enzymes were negative at that time.  She was previously referred to stress testing but declined the COVID test that was needed to have stress test.  Her stress test is again pending.  She gets headaches when her BP is high.  At her last appointment metoprolol was switched to carvedilol. We discussed the importance of taking medications as prescribed. Since that time she followed up with Dr. [name] who switched her clonidine to a patch. She had normal renal artery dopplers 04/2020. Renin and aldosterone levels were  indeterminate for hyperaldosteronism.  Today, she is not feeling well and is very frustrated about her uncontrolled blood pressure. At home her blood pressure is fluctuating between stable and high.  Lately, she has been unable to sleep due to a persistent cough and congestion. She was prescribed promethazine-dextromethorphan and this seems to help. She is also having success with losing weight. For exercise she walks and occasionally runs. She will exercise at least 3 days a week, and she feels good while exercising. For her diet, she drinks a lot of water. Also, she steams her fruit and vegetables and does not eat meats. After the first day of using the clonidine patch she felt very ill and had a headache.She denies any chest pain, shortness of breath, palpitations, or exertional symptoms. No lightheadedness, or syncope to report. Also has no lower extremity edema, orthopnea or PND.   Previous antihypertensives: Amlodipine- swelling Lisinopril- unknown Imdur Hydralazine- headaches   Past Medical History:  Diagnosis Date   Anxiety    Essential hypertension    Hypothyroidism    NSTEMI (non-ST elevated myocardial infarction) ([place]) 2015   a.  Related to occlusion of the distal Cx. The mid to distal OM1 also contains a significant stenosis that is best treated with medical therapy. Patent RCA/LAD with tortuosity. LM patent. EF 55%.    Sleep apnea     Past Surgical History:  Procedure Laterality Date   ABDOMINAL HYSTERECTOMY     CARDIAC CATHETERIZATION  ~ 2000  CORONARY ANGIOPLASTY  09/20/2013   LEFT HEART CATHETERIZATION WITH CORONARY ANGIOGRAM N/A 09/20/2013   Procedure: LEFT HEART CATHETERIZATION WITH CORONARY ANGIOGRAM;  Surgeon: Sinclair Grooms, MD;  Location: [facility] CATH LAB;  Service: Cardiovascular;  Laterality: N/A;   LEFT HEART CATHETERIZATION WITH CORONARY ANGIOGRAM N/A 09/27/2013   Procedure: LEFT HEART CATHETERIZATION WITH CORONARY ANGIOGRAM;  Surgeon: Blane Ohara, MD;   Location: [facility] CATH LAB;  Service: Cardiovascular;  Laterality: N/A;   THYROIDECTOMY  10/08/2011   Procedure: THYROIDECTOMY;  Surgeon: [name], MD;  Location: [facility] ORS;  Service: General;  Laterality: N/A;  Total   VAGINAL HYSTERECTOMY  1993    Current Medications: Current Meds  Medication Sig   albuterol (ACCUNEB) 1.25 MG/3ML nebulizer solution SMARTSIG:3 Milliliter(s) By Mouth Every 6 Hours PRN   chlorthalidone (HYGROTON) 25 MG tablet TAKE 1 TABLET BY MOUTH DAILY   cloNIDine (CATAPRES - DOSED IN MG/24 HR) 0.3 mg/24hr patch Place 1 patch (0.3 mg total) onto the skin once a week.   diclofenac Sodium (VOLTAREN) 1 % GEL Apply 2 g topically 4 (four) times daily as needed.   levothyroxine (SYNTHROID) 125 MCG tablet Take 125 mcg by mouth daily before breakfast. 1 HOUR PRIOR TO OTHER MEDICATIONS   nitroGLYCERIN (NITROSTAT) 0.4 MG SL tablet TAKE 1 TABLET UNDER THE TONGUE EVERY 5 MINS FOR CHEST PAIN - AFTER 2 DOSES GO TO ED   promethazine-dextromethorphan (PROMETHAZINE-DM) 6.25-15 MG/5ML syrup Take 5 mLs by mouth at bedtime as needed.   sodium chloride 1 g tablet Take 1 tablet (1 g total) by mouth 3 (three) times daily with meals. For 3 days only   spironolactone (ALDACTONE) 25 MG tablet Take 1 tablet (25 mg total) by mouth daily.     Allergies:   Fluviral [influenza virus vaccine], Haemophilus influenzae, Amlodipine, Lisinopril, and Ranexa [ranolazine]   Social History   Socioeconomic History   Marital status: Widowed    Spouse name: Not on file   Number of children: Not on file   Years of education: Not on file   Highest education level: Not on file  Occupational History   Not on file  Tobacco Use   Smoking status: Never   Smokeless tobacco: Never  Vaping Use   Vaping Use: Never used  Substance and Sexual Activity   Alcohol use: No    Alcohol/week: 0.0 standard drinks   Drug use: No   Sexual activity: Never  Other Topics Concern   Not on file  Social History Narrative   **  Merged History Encounter **  Lives at home with family.  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      Social Determinants of Health   Financial Resource Strain: Low Risk    Difficulty of Paying Living Expenses: Not very hard  Food Insecurity: No Food Insecurity   Worried About Charity fundraiser in the Last Year: Never true   Ran Out of Food in the Last Year: Never true  Transportation Needs: No Transportation Needs   Lack of Transportation (Medical): No   Lack of Transportation (Non-Medical): No  Physical Activity: Sufficiently Active   Days of Exercise per Week: 5 days   Minutes of Exercise per Session: 30 min  Stress: No Stress Concern Present   Feeling of Stress : Not at all  Social Connections: Not on file     Family History: The patient's family history includes Colon cancer in her father.  ROS:   Please see the history of present illness.    (+)  Tearful (+) Stress (+) Persistent cough (+) Congestion (+) Insomnia All other systems reviewed and are negative.  EKGs/Labs/Other Studies Reviewed:    EKG:   07/21/2020: EKG is not ordered today. 02/28/2020: EKG was not ordered.   01/23/2020: Sinus rhythm.  Rate 75 bpm.  Left axis deviation.  LVH with secondary repolarization abnormality.  Prior anteroseptal infarct.  Recent Labs: 09/04/2019: ALT 23; TSH 4.301 01/23/2020: BUN 16; Creatinine, Ser 1.01; Hemoglobin 15.5; Platelets 550; Potassium 3.6; Sodium 139   Recent Lipid Panel    Component Value Date/Time   CHOL 194 09/04/2019 1216   TRIG 63 09/04/2019 1216   HDL 54 09/04/2019 1216   CHOLHDL 3.6 09/04/2019 1216   VLDL 13 09/04/2019 1216   LDLCALC 127 (H) 09/04/2019 1216   US Renal Artery Duplex 04/24/2020: Summary:  Largest Aortic Diameter: 2.3 cm     Renal:  Right: Normal size right kidney. Normal cortical thickness of right         kidney. Normal right Resisitive Index. No evidence of right         renal artery stenosis. RRV flow present.  Left:  Normal size of left  kidney. Normal left Resistive Index.         Normal cortical thickness of the left kidney. No evidence of         left renal artery stenosis. LRV flow present.  Mesenteric: Normal Celiac artery and Superior Mesenteric artery findings.   IVC is patent.   Physical Exam:    VS:  BP (!) 196/104 (BP Location: Left Arm, Patient Position: Sitting)   Pulse 76   Ht 5\' 3"  (1.6 m)   Wt 178 lb (80.7 kg)   SpO2 96%   BMI 31.53 kg/m  , BMI Body mass index is 31.53 kg/m. GENERAL:  Well appearing.  Tearful at times. HEENT: Pupils equal round and reactive, fundi not visualized, oral mucosa unremarkable NECK:  No jugular venous distention, waveform within normal limits, carotid upstroke brisk and symmetric, no bruits LUNGS:  Clear to auscultation bilaterally HEART:  RRR.  PMI not displaced or sustained,S1 and S2 within normal limits, no S3, no S4, no clicks, no rubs, no murmurs ABD:  Flat, positive bowel sounds normal in frequency in pitch, no bruits, no rebound, no guarding, no midline pulsatile mass, no hepatomegaly, no splenomegaly EXT:  2 plus pulses throughout, no edema, no cyanosis no clubbing SKIN:  No rashes no nodules NEURO:  Cranial nerves II through XII grossly intact, motor grossly intact throughout PSYCH:  Cognitively intact, oriented to person place and time   ASSESSMENT:    1. Essential hypertension   2. Other specified hypothyroidism   3. Hypothyroidism, unspecified type   4. CAD in native artery      PLAN:   Hypothyroidism TSH was abnormal when last checked 10/2019.  We will repeat when she returns for labs.  Essential hypertension BP has been persistently elevated.  Renin/alsdo are indeterminate.  We will work on sodium loading with 1g NaCL tid with meals.  On day three she will come for a TSH, BMP and start a 24h urine collection for sodium, creatinine, and aldosterone.  She will start spironolactone 25mg  after those labs.  Continue carvedilol, chlorthalidone and clonidine  patch.  CAD in native artery She has been intolerant to statins.  Right now we are working on her blood pressure.  Given her intolerance to multiple medications we will focus on this and consider starting PCSK9 inhibitor or alternative agent in  the future.  Currently she has been feeling well and able to run and walk without symptoms.  She previously had a stress test pending but I think it is okay for her to hold off on that for now.   Disposition:    FU with MD/PharmD in 1 month.    Medication Adjustments/Labs and Tests Ordered: Current medicines are reviewed at length with the patient today.  Concerns regarding medicines are outlined above.  Orders Placed This Encounter  Procedures   Aldosterone, Urine   Basic metabolic panel   Creatine, 24-Hour Urine   Sodium, urine, 24 hour   TSH    Meds ordered this encounter  Medications   sodium chloride 1 g tablet    Sig: Take 1 tablet (1 g total) by mouth 3 (three) times daily with meals. For 3 days only    Dispense:  9 tablet    Refill:  0   spironolactone (ALDACTONE) 25 MG tablet    Sig: Take 1 tablet (25 mg total) by mouth daily.    Dispense:  90 tablet    Refill:  3    I,Mathew Stumpf,acting as a scribe for Skeet Latch, MD.,have documented all relevant documentation on the behalf of Skeet Latch, MD,as directed by  Skeet Latch, MD while in the presence of Skeet Latch, MD.  I, [place] Oval Linsey, MD have reviewed all documentation for this visit.  The documentation of the exam, diagnosis, procedures, and orders on 07/21/2020 are all accurate and complete.   Signed, Skeet Latch, MD  07/21/2020 1:20 PM    [facility]

## 2020-07-21 ENCOUNTER — Encounter: Payer: Self-pay | Admitting: Cardiovascular Disease

## 2020-07-21 ENCOUNTER — Other Ambulatory Visit: Payer: Self-pay

## 2020-07-21 ENCOUNTER — Ambulatory Visit (INDEPENDENT_AMBULATORY_CARE_PROVIDER_SITE_OTHER): Payer: Medicare HMO | Admitting: Cardiovascular Disease

## 2020-07-21 VITALS — BP 196/104 | HR 76 | Ht 63.0 in | Wt 178.0 lb

## 2020-07-21 DIAGNOSIS — I2510 Atherosclerotic heart disease of native coronary artery without angina pectoris: Secondary | ICD-10-CM

## 2020-07-21 DIAGNOSIS — I10 Essential (primary) hypertension: Secondary | ICD-10-CM | POA: Diagnosis not present

## 2020-07-21 DIAGNOSIS — E039 Hypothyroidism, unspecified: Secondary | ICD-10-CM | POA: Diagnosis not present

## 2020-07-21 DIAGNOSIS — E038 Other specified hypothyroidism: Secondary | ICD-10-CM

## 2020-07-21 MED ORDER — SODIUM CHLORIDE 1 G PO TABS: 1.0000 g | ORAL_TABLET | Freq: Three times a day (TID) | ORAL | 0 refills | Status: DC

## 2020-07-21 MED ORDER — SPIRONOLACTONE 25 MG PO TABS: 25.0000 mg | ORAL_TABLET | Freq: Every day | ORAL | 3 refills | Status: DC

## 2020-07-21 NOTE — Assessment & Plan Note (Signed)
BP has been persistently elevated.  Renin/alsdo are indeterminate.  We will work on sodium loading with 1g NaCL tid with meals.  On day three she will come for a TSH, BMP and start a 24h urine collection for sodium, creatinine, and aldosterone.  She will start spironolactone 25mg  after those labs.  Continue carvedilol, chlorthalidone and clonidine patch.

## 2020-07-21 NOTE — Assessment & Plan Note (Signed)
TSH was abnormal when last checked 10/2019.  We will repeat when she returns for labs.

## 2020-07-21 NOTE — Patient Instructions (Addendum)
Medication Instructions:  START SODIUM 1 G TABLETS 3 TIMES A DAY WITH FOOD FOR 3 DAYS ONLY ON THE THIRD DAY COME FOR YOUR LABS AND START THE 24 HOUR URINE   START SPIRONOLACTONE 25 MG DAILY ONCE YOU COMPLETE YOUR 24 HOUR URINE   Labwork: ON THE 3RD DAY OF YOUR SODIUM TABLETS COME FOR YOU LABS AND START YOUR 24 HOUR URINE   Testing/Procedures: NONE   Follow-Up: 09/03/2020 AT 8:45 AM   Any Other Special Instructions Will Be Listed Below (If Applicable).  24-Hour Urine Collection A 24-hour urine specimen is a lab test that requires collection of all your urine for an entire day. This is sometimes called a timed urine test. It canprovide more information than a single urine sample. There are many reasons to have this test. Your health care provider may order the test to check for or monitor the following conditions: High blood pressure. Kidney disease. Kidney stones. Urinary tract infections. Pregnancy. Diabetes. How do I prepare for this test? You may be asked to follow a special diet during or before the collection period. Follow any instructions from your health care provider. If no special instructions are given, you may eat and drink normally. Take over-the-counter and prescription medicines only as told by your health care provider. Let your health care provider know about any medicines that you are taking, including over-the-counter medicines, vitamins, herbs, and supplements. Choose a collection day when you can be at home or when you have a place to store the urine. All urine must be collected during the testing period. How do I do a 24-hour urine collection?  When you get up in the morning, urinate in the toilet and flush. Write down the time. This will be your start time on the day of collection and your end time on the next morning. From the start time on, all of your urine should be kept in the collection jug that you received from the lab. If the jug that is given to you  already has liquid in it, that is okay. Do not throw out the liquid or rinse out the jug. Some tests need the liquid to be added to your urine. Urinate into a specimen container, such as a urinal or pan that sits over the toilet. Pour the urine from the container into the collection jug. Be careful not to spill any of the urine. Use the equipment provided by the lab. Do not let any toilet paper or stool (feces) get into the jug. This will contaminate the sample. Stop collecting your urine 24 hours after you started. Collect the last specimen as close as possible to the end of the 24-hour period. Keep the jug cool in an ice chest or keep it in the refrigerator during collection. When the 24-hour collection is complete, bring the jug to the lab. Keep the jug cool in an ice chest while you are bringing it to the lab. What do the results mean? Talk with your health care provider about what your results mean. Questions to ask your health care provider Ask your health care provider, or the department that is doing the test: When will my results be ready? How will I get my results? What are my treatment options? What other tests do I need? What are my next steps? Summary A 24-hour urine specimen is a lab test that requires collection of all your urine for an entire day. When you get up in the morning, urinate in the toilet and  flush. Write down the time. For the next 24 hours, collect all of your urine in the collection jug that you received from the lab. Keep the jug cool while collecting the urine and while bringing it back to the lab. Take the jug of urine back to the lab as soon as possible after the collection period has ended. This information is not intended to replace advice given to you by your health care provider. Make sure you discuss any questions you have with your healthcare provider. Document Revised: 10/04/2019 Document Reviewed: 10/04/2019 Elsevier Patient Education  [place].

## 2020-07-21 NOTE — Assessment & Plan Note (Signed)
She has been intolerant to statins.  Right now we are working on her blood pressure.  Given her intolerance to multiple medications we will focus on this and consider starting PCSK9 inhibitor or alternative agent in the future.  Currently she has been feeling well and able to run and walk without symptoms.  She previously had a stress test pending but I think it is okay for her to hold off on that for now.

## 2020-07-24 ENCOUNTER — Other Ambulatory Visit: Payer: Self-pay | Admitting: *Deleted

## 2020-07-24 NOTE — Patient Outreach (Signed)
[place] [facility]) Care Management  07/24/2020  [name] [name] 10-Mar-1945 [id number]  Initial telephone outreach for an [place] referral for care management. Pt mailbox is full, no other numbers listed. I will try to call again tomorrow.  [name]. [name], MSN, [facility] Gerontological Nurse Practitioner [facility] Care Management [phone]

## 2020-07-25 ENCOUNTER — Other Ambulatory Visit: Payer: Self-pay | Admitting: *Deleted

## 2020-07-28 DIAGNOSIS — I10 Essential (primary) hypertension: Secondary | ICD-10-CM | POA: Diagnosis not present

## 2020-07-28 DIAGNOSIS — E038 Other specified hypothyroidism: Secondary | ICD-10-CM | POA: Diagnosis not present

## 2020-07-28 NOTE — Patient Outreach (Signed)
[place] [facility]) Care Management  07/28/2020  TEDRA COPPERNOLL [birth year] [id number]  Second outreach call, unsuccessful, mail box full and no other listed numbers.  Will send unsuccessful letter and call again in 2 weeks.  [name]. [name], MSN, [facility] Gerontological Nurse Practitioner [facility] Care Management [phone]

## 2020-07-29 LAB — BASIC METABOLIC PANEL
BUN/Creatinine Ratio: 13 (ref 12–28)
BUN: 14 mg/dL (ref 8–27)
CO2: 22 mmol/L (ref 20–29)
Calcium: 9.9 mg/dL (ref 8.7–10.3)
Chloride: 102 mmol/L (ref 96–106)
Creatinine, Ser: 1.04 mg/dL — ABNORMAL HIGH (ref 0.57–1.00)
Glucose: 119 mg/dL — ABNORMAL HIGH (ref 65–99)
Potassium: 4.6 mmol/L (ref 3.5–5.2)
Sodium: 140 mmol/L (ref 134–144)
eGFR: 56 mL/min/{1.73_m2} — ABNORMAL LOW (ref >59)

## 2020-07-29 LAB — TSH: TSH: 0.4 u[IU]/mL — ABNORMAL LOW (ref 0.450–4.500)

## 2020-08-01 ENCOUNTER — Other Ambulatory Visit: Payer: Self-pay | Admitting: Cardiovascular Disease

## 2020-08-01 DIAGNOSIS — E038 Other specified hypothyroidism: Secondary | ICD-10-CM | POA: Diagnosis not present

## 2020-08-01 DIAGNOSIS — I10 Essential (primary) hypertension: Secondary | ICD-10-CM | POA: Diagnosis not present

## 2020-08-05 LAB — SODIUM, URINE, 24 HOUR
Sodium, 24H Ur: 150 mmol/24 hr (ref 39–258)
Sodium, Ur: 100 mmol/L

## 2020-08-05 LAB — CREATINE, 24-HOUR URINE
Creat Conc: 5 mg/dL
Creatinine, 24H Ur: 75 mg/24 hr (ref 0–80)

## 2020-08-05 LAB — ALDOSTERONE, URINE
Aldosterone U,Random: 3.84 ug/L
Aldosterone, 24H Ur: 5.76 ug/24 hr (ref 0.00–19.00)

## 2020-08-13 ENCOUNTER — Telehealth: Payer: Self-pay | Admitting: Cardiovascular Disease

## 2020-08-13 DIAGNOSIS — E039 Hypothyroidism, unspecified: Secondary | ICD-10-CM

## 2020-08-13 NOTE — Telephone Encounter (Signed)
Patient states she received a call from the office, but her phone has been messed up. She states she is using someone else's number and to call: [phone], until she gets her phone fixed

## 2020-08-14 NOTE — Telephone Encounter (Signed)
Patient calling back she states her phone should be fixed now and to call her at her normal number: [phone]

## 2020-08-14 NOTE — Telephone Encounter (Signed)
Skeet Latch, MD  08/12/2020 11:36 AM EDT Back to Top     Labs are not consistent with hyperaldosteronism causing her high blood pressure.      Skeet Latch, MD  07/29/2020  6:01 PM EDT      Kidney function is stable.  Thyroid is mildly abnormal.  Follow-up with PCPabout this.    Called patient back with both lab results. Patient stated she is no longer seeing Dr. Legrand Rams and she is in the process of finding a new PCP. Gave patient number for [facility] to see if they could help her to locate a new PCP. Informed patient that in the mean time will send message to Dr. Oval Linsey for advisement on TSH results. Patient stated she does not have a thyroid.

## 2020-08-15 NOTE — Telephone Encounter (Signed)
Per Dr. Oval Linsey, TSH is very mildly abnormal.  It will need to be repeated when she gets a new PCP.  Tried to call patient, no answer and no voicemail to leave message. Will send to Dr. Blenda Mounts nurse to follow up.

## 2020-08-18 NOTE — Telephone Encounter (Signed)
Advised patient and referral placed for new endocrinologist at the request of patient

## 2020-08-19 ENCOUNTER — Other Ambulatory Visit: Payer: Self-pay | Admitting: *Deleted

## 2020-08-19 NOTE — Patient Outreach (Signed)
[place] [facility]) Care Management  08/19/2020  MAERYN MCGATH 30-May-1945 [id number]  Third and final telephone outreach for [facility] referral, unsuccessful. Left a message advising of last call but encouraging a call back to provide information.  [name]. [name], MSN, [facility] Gerontological Nurse Practitioner [facility] Care Management [phone]

## 2020-08-21 ENCOUNTER — Telehealth: Payer: Self-pay | Admitting: Cardiovascular Disease

## 2020-08-21 DIAGNOSIS — N393 Stress incontinence (female) (male): Secondary | ICD-10-CM

## 2020-08-21 DIAGNOSIS — E039 Hypothyroidism, unspecified: Secondary | ICD-10-CM

## 2020-08-21 NOTE — Telephone Encounter (Signed)
Attempted to contact patient, unable to leave message at this time due to no VM. Will try again at a later time and will route to Dr. Blenda Mounts nurse for follow up.

## 2020-08-21 NOTE — Telephone Encounter (Signed)
Patient states she she found an endocrinologist in [place] and she is hoping to have a referral sent to them. She is requesting to speak with Dr. Blenda Mounts nurse.

## 2020-08-22 NOTE — Telephone Encounter (Addendum)
Mailbox full, will continue to try to reach patient

## 2020-08-25 ENCOUNTER — Telehealth: Payer: Self-pay | Admitting: Cardiology

## 2020-08-25 DIAGNOSIS — H35033 Hypertensive retinopathy, bilateral: Secondary | ICD-10-CM | POA: Diagnosis not present

## 2020-08-25 DIAGNOSIS — H524 Presbyopia: Secondary | ICD-10-CM | POA: Diagnosis not present

## 2020-08-25 NOTE — Telephone Encounter (Signed)
Reports she no longer has blurred vision and was able to drive herself home after eye appointment. Reports continue to have headache rated 9/10. Advised to take tylenol and says she doesn't have tylenol and is unable to take tylenol due to it causing her stomach to get upset. Denies chest pain, sob, numbness or tingling BP checked while on phone and patient reports BP as 100/101 & HR 72  Reports unable to tolerate clonidine patch. Reports 10 minutes after placing patch, she develops a headache and nausea. Says she removes the clonidine patch and symptoms improve, then she replaces the same clonidine patch.  Advised to take tylenol for headache and continue monitoring symptoms. Advised to continue checking home BP's and bring to up coming visit with Dr. Juluis Rainier if symptoms get worse to go to the ED for an evaluation Verbalized understanding of plan

## 2020-08-25 NOTE — Telephone Encounter (Signed)
[facility] called back stating that patient feels stable and is going to go home. Please call her at home #

## 2020-08-25 NOTE — Telephone Encounter (Signed)
Patient is at [facility] for routine eye check. Started complaining of blurred vision with a headache.  BP was taken 180/110. States that the Clonidine is making her sick.  [phone].

## 2020-08-26 NOTE — Telephone Encounter (Signed)
Skeet Latch, MD  Merlene Laughter, RN; Satira Sark, MD Caller: Unspecified Wilburn Mylar, 10:43 AM) Thank you.  Agree with holding the clonidine patch.        Patient informed and verbalized understanding of plan.

## 2020-09-02 NOTE — Progress Notes (Addendum)
Advanced Hypertension Clinic Follow-up:    Date:  10/05/2020   ID:  [name] [name] [birth year], MRN [id number]  PCP:  Pcp, No  Cardiologist:  [name], MD  Nephrologist:  Referring MD: Rosita Fire, MD   CC: Hypertension  History of Present Illness:    [name] [name] is a 75 y.o. female with a hx of CAD, NSTEMI, hypertension, hyperlipidemia, diabetes, and OSA here for follow-up. She initially established care in the hypertension clinic 02/28/2020.  She was first diagnosed with hypertension in 1980 when she had her last child.  It has been elevated ever since.  Initially it was well-controlled.  For the last five years it has been more difficult to control.  She previously had an NSTEMI 09/2013.  She has a distal occluded OM1 that was medically managed.  Echo at that time revealed LVEF 55 to 60%.  She had a stress test 09/2015 that was low risk.  She has not tolerated amlodipine, Ranexa, Imdur, rosuvastatin, or pravastatin.  She was previously a patient of Dr. Bronson Ing and more recently has been seen by Katina Dung, NP.  It was noted that she was sporadically taking chlorthalidone only when having headaches or volume overload.  She also only has been taking metoprolol every other day.  She does take clonidine as prescribed.  She was started on hydralazine but it was discontinued due to headaches.  She has been seen in the ED 01/2020 with chest pain.  Cardiac enzymes were negative at that time.  She was previously referred to stress testing but declined the COVID test that was needed to have stress test.  Her stress test is again pending.  She gets headaches when her BP is high.  Metoprolol was switched to carvedilol. We discussed the importance of taking medications as prescribed. Since that time she followed up with Dr. [name] who switched her clonidine to a patch. She had normal renal artery dopplers 04/2020. Renin and aldosterone levels were indeterminate for  hyperaldosteronism. She was prescribed promethazine-dextromethorphan and this seems to help. At her last appointment her blood pressure was elevated so she was started on spironolactone. She had sodium loading for confirmation of hyperaldosteronism, which was negative. However, labs indicated that she was not appropriately sodium loaded. She called from her eye doctor's office 08/2020 with a blood pressure of 180/100. She also reported that the clonidine patch was making her sick.    Today, she states she does not feel good overall, but is pushing on throughout her days. She is able to function, but she does not sleep well. For insomnia she has tried melatonin, but this was not effective.  Lately, she does not have any issues with LE edema. For her regimen, she reports that she stopped taking chorthalidone and spironolactone for the lab testing, but then did not restart them afterwards. She confirms taking carvedilol. Currently she is wearing her clonidine patch. While wearing her patch, she will feel side effects of chest and back tightness that gradually worsens. Also, she is experiencing some dysphagia, and believes her esophagus needs dilation, and requests a GI referral. She denies any shortness of breath, palpitations, or exertional symptoms. No headaches, lightheadedness, or syncope to report. Also has no orthopnea or PND. She is tearful at times.  Previous antihypertensives: Amlodipine- swelling Lisinopril- unknown Imdur Hydralazine- headaches   Past Medical History:  Diagnosis Date   Anxiety    Dysphagia 09/03/2020   Essential hypertension    Hypothyroidism    NSTEMI (  non-ST elevated myocardial infarction) ([place]) 2015   a.  Related to occlusion of the distal Cx. The mid to distal OM1 also contains a significant stenosis that is best treated with medical therapy. Patent RCA/LAD with tortuosity. LM patent. EF 55%.    Sleep apnea     Past Surgical History:  Procedure Laterality Date    ABDOMINAL HYSTERECTOMY     CARDIAC CATHETERIZATION  ~ 2000   CORONARY ANGIOPLASTY  09/20/2013   LEFT HEART CATHETERIZATION WITH CORONARY ANGIOGRAM N/A 09/20/2013   Procedure: LEFT HEART CATHETERIZATION WITH CORONARY ANGIOGRAM;  Surgeon: Sinclair Grooms, MD;  Location: [name] CATH LAB;  Service: Cardiovascular;  Laterality: N/A;   LEFT HEART CATHETERIZATION WITH CORONARY ANGIOGRAM N/A 09/27/2013   Procedure: LEFT HEART CATHETERIZATION WITH CORONARY ANGIOGRAM;  Surgeon: Blane Ohara, MD;  Location: [facility] CATH LAB;  Service: Cardiovascular;  Laterality: N/A;   THYROIDECTOMY  10/08/2011   Procedure: THYROIDECTOMY;  Surgeon: [name], MD;  Location: [facility] ORS;  Service: General;  Laterality: N/A;  Total   VAGINAL HYSTERECTOMY  1993    Current Medications: Current Meds  Medication Sig   diclofenac Sodium (VOLTAREN) 1 % GEL Apply 2 g topically 4 (four) times daily as needed.   levothyroxine (SYNTHROID) 125 MCG tablet Take 125 mcg by mouth daily before breakfast. 1 HOUR PRIOR TO OTHER MEDICATIONS   nitroGLYCERIN (NITROSTAT) 0.4 MG SL tablet TAKE 1 TABLET UNDER THE TONGUE EVERY 5 MINS FOR CHEST PAIN - AFTER 2 DOSES GO TO ED   [DISCONTINUED] carvedilol (COREG) 25 MG tablet Take 1 tablet (25 mg total) by mouth 2 (two) times daily.   [DISCONTINUED] cloNIDine (CATAPRES - DOSED IN MG/24 HR) 0.3 mg/24hr patch Place 1 patch (0.3 mg total) onto the skin once a week.     Allergies:   Fluviral [influenza virus vaccine], Haemophilus influenzae, Amlodipine, Lisinopril, and Ranexa [ranolazine]   Social History   Socioeconomic History   Marital status: Widowed    Spouse name: Not on file   Number of children: Not on file   Years of education: Not on file   Highest education level: Not on file  Occupational History   Not on file  Tobacco Use   Smoking status: Never   Smokeless tobacco: Never  Vaping Use   Vaping Use: Never used  Substance and Sexual Activity   Alcohol use: No    Alcohol/week: 0.0  standard drinks   Drug use: No   Sexual activity: Never  Other Topics Concern   Not on file  Social History Narrative   ** Merged History Encounter **  Lives at home with family.  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      Social Determinants of Health   Financial Resource Strain: Low Risk    Difficulty of Paying Living Expenses: Not very hard  Food Insecurity: No Food Insecurity   Worried About Charity fundraiser in the Last Year: Never true   Ran Out of Food in the Last Year: Never true  Transportation Needs: No Transportation Needs   Lack of Transportation (Medical): No   Lack of Transportation (Non-Medical): No  Physical Activity: Sufficiently Active   Days of Exercise per Week: 5 days   Minutes of Exercise per Session: 30 min  Stress: No Stress Concern Present   Feeling of Stress : Not at all  Social Connections: Not on file     Family History: The patient's family history includes Colon cancer in her father.  ROS:  Please see the history of present illness.    (+) Insomnia (+) Chest and Back tightness (+) Dysphagia (+) Tearful All other systems reviewed and are negative.  EKGs/Labs/Other Studies Reviewed:    EKG:   09/02/2020: EKG is not ordered today. 07/21/2020: EKG was not ordered. 02/28/2020: EKG was not ordered.   01/23/2020: Sinus rhythm.  Rate 75 bpm.  Left axis deviation.  LVH with secondary repolarization abnormality.  Prior anteroseptal infarct.  Recent Labs: 01/23/2020: Hemoglobin 15.5; Platelets 550 07/28/2020: TSH 0.400 09/10/2020: BUN 22; Creatinine, Ser 1.16; Potassium 4.5; Sodium 143   Recent Lipid Panel    Component Value Date/Time   CHOL 194 09/04/2019 1216   TRIG 63 09/04/2019 1216   HDL 54 09/04/2019 1216   CHOLHDL 3.6 09/04/2019 1216   VLDL 13 09/04/2019 1216   LDLCALC 127 (H) 09/04/2019 1216   US Renal Artery Duplex 04/24/2020: Summary:  Largest Aortic Diameter: 2.3 cm     Renal:  Right: Normal size right kidney. Normal cortical thickness  of right         kidney. Normal right Resisitive Index. No evidence of right         renal artery stenosis. RRV flow present.  Left:  Normal size of left kidney. Normal left Resistive Index.         Normal cortical thickness of the left kidney. No evidence of         left renal artery stenosis. LRV flow present.  Mesenteric: Normal Celiac artery and Superior Mesenteric artery findings.   IVC is patent.   Physical Exam:    VS:  BP (!) 176/128   Pulse 84   Ht '5\' 3"'$  (1.6 m)   Wt 175 lb (79.4 kg)   SpO2 98%   BMI 31.00 kg/m  , BMI Body mass index is 31 kg/m. GENERAL:  Well appearing.  No acute distress HEENT: Pupils equal round and reactive, fundi not visualized, oral mucosa unremarkable NECK:  No jugular venous distention, waveform within normal limits, carotid upstroke brisk and symmetric, no bruits LUNGS:  Clear to auscultation bilaterally HEART:  RRR.  PMI not displaced or sustained,S1 and S2 within normal limits, no S3, no S4, no clicks, no rubs, no murmurs ABD:  Flat, positive bowel sounds normal in frequency in pitch, no bruits, no rebound, no guarding, no midline pulsatile mass, no hepatomegaly, no splenomegaly EXT:  2 plus pulses throughout, no edema, no cyanosis no clubbing SKIN:  No rashes no nodules NEURO:  Cranial nerves II through XII grossly intact, motor grossly intact throughout PSYCH:  Cognitively intact, oriented to person place and time   ASSESSMENT:    1. Dysphagia, unspecified type   2. Essential hypertension   3. CAD in native artery      PLAN:   Essential hypertension Blood pressure remains very poorly controlled.  There is some confusion about what she was supposed to be taking.  She never started taking spironolactone after she had her labs checked.  She also is not taking chlorthalidone as she did not think she was post be taking it anymore.  Therefore, her blood pressure is still consistently elevated.  She is going to start taking all 4 blood  pressure medications prescribed, including carvedilol, clonidine, chlorthalidone, and spironolactone.  She thinks that the clonidine patch is making her feel poorly.  We discussed her calling us before the next refill, as we may be able to lower the dose that she is actually taking all 3 of the  other medications.  CAD in native artery Stable.  She has no angina.  Dysphagia She reports difficulty swallowing pills and other food.  Will refer to GI.   Disposition:    FU with MD/PharmD in 1 month.    Medication Adjustments/Labs and Tests Ordered: Current medicines are reviewed at length with the patient today.  Concerns regarding medicines are outlined above.  Orders Placed This Encounter  Procedures   Basic metabolic panel   Ambulatory referral to Gastroenterology    Meds ordered this encounter  Medications   chlorthalidone (HYGROTON) 25 MG tablet    Sig: Take 1 tablet (25 mg total) by mouth daily.    Dispense:  90 tablet    Refill:  1    This prescription was filled on 01/29/2020. Any refills authorized will be placed on file.   cloNIDine (CATAPRES - DOSED IN MG/24 HR) 0.3 mg/24hr patch    Sig: Place 1 patch (0.3 mg total) onto the skin once a week.    Dispense:  4 patch    Refill:  6    07/15/2020 NEW-stop clonidine 0.1 mg tablets   spironolactone (ALDACTONE) 25 MG tablet    Sig: Take 1 tablet (25 mg total) by mouth daily.    Dispense:  90 tablet    Refill:  3   carvedilol (COREG) 25 MG tablet    Sig: Take 1 tablet (25 mg total) by mouth 2 (two) times daily.    Dispense:  180 tablet    Refill:  1    I,Mathew Stumpf,acting as a scribe for Skeet Latch, MD.,have documented all relevant documentation on the behalf of Skeet Latch, MD,as directed by  Skeet Latch, MD while in the presence of Skeet Latch, MD.  I, [place] Oval Linsey, MD have reviewed all documentation for this visit.  The documentation of the exam, diagnosis, procedures, and orders on 10/05/2020  are all accurate and complete.   Signed, Skeet Latch, MD  10/05/2020 9:11 AM    [place]

## 2020-09-03 ENCOUNTER — Encounter (HOSPITAL_BASED_OUTPATIENT_CLINIC_OR_DEPARTMENT_OTHER): Payer: Self-pay | Admitting: Cardiovascular Disease

## 2020-09-03 ENCOUNTER — Telehealth: Payer: Self-pay | Admitting: Cardiovascular Disease

## 2020-09-03 ENCOUNTER — Other Ambulatory Visit: Payer: Self-pay

## 2020-09-03 ENCOUNTER — Encounter: Payer: Self-pay | Admitting: Endocrinology

## 2020-09-03 ENCOUNTER — Ambulatory Visit (INDEPENDENT_AMBULATORY_CARE_PROVIDER_SITE_OTHER): Payer: Medicare HMO | Admitting: Cardiovascular Disease

## 2020-09-03 VITALS — BP 176/128 | HR 84 | Ht 63.0 in | Wt 175.0 lb

## 2020-09-03 DIAGNOSIS — I2510 Atherosclerotic heart disease of native coronary artery without angina pectoris: Secondary | ICD-10-CM

## 2020-09-03 DIAGNOSIS — I10 Essential (primary) hypertension: Secondary | ICD-10-CM

## 2020-09-03 DIAGNOSIS — R1310 Dysphagia, unspecified: Secondary | ICD-10-CM | POA: Diagnosis not present

## 2020-09-03 HISTORY — DX: Dysphagia, unspecified: R13.10

## 2020-09-03 MED ORDER — CHLORTHALIDONE 25 MG PO TABS: 25.0000 mg | ORAL_TABLET | Freq: Every day | ORAL | 1 refills | Status: DC

## 2020-09-03 MED ORDER — CARVEDILOL 25 MG PO TABS: 25.0000 mg | ORAL_TABLET | Freq: Two times a day (BID) | ORAL | 1 refills | Status: DC

## 2020-09-03 MED ORDER — CLONIDINE 0.3 MG/24HR TD PTWK: 0.3000 mg | MEDICATED_PATCH | TRANSDERMAL | 6 refills | Status: DC

## 2020-09-03 MED ORDER — SPIRONOLACTONE 25 MG PO TABS: 25.0000 mg | ORAL_TABLET | Freq: Every day | ORAL | 3 refills | Status: DC

## 2020-09-03 NOTE — Assessment & Plan Note (Signed)
She reports difficulty swallowing pills and other food.  Will refer to GI.

## 2020-09-03 NOTE — Assessment & Plan Note (Signed)
Blood pressure remains very poorly controlled.  There is some confusion about what she was supposed to be taking.  She never started taking spironolactone after she had her labs checked.  She also is not taking chlorthalidone as she did not think she was post be taking it anymore.  Therefore, her blood pressure is still consistently elevated.  She is going to start taking all 4 blood pressure medications prescribed, including carvedilol, clonidine, chlorthalidone, and spironolactone.  She thinks that the clonidine patch is making her feel poorly.  We discussed her calling us before the next refill, as we may be able to lower the dose that she is actually taking all 3 of the other medications.

## 2020-09-03 NOTE — Telephone Encounter (Signed)
Encounter not needed

## 2020-09-03 NOTE — Assessment & Plan Note (Signed)
Stable.  She has no angina.

## 2020-09-03 NOTE — Patient Instructions (Addendum)
Medication Instructions:  START TAKING THE FOLLOWING CHLORTHALIDONE 25 MG DAILY CARVEDILOL 25 MG TWICE A DAY SPIRONOLACTONE 25 MG DAILY  CLONIDINE  0.3 MG PATCH APPLY WEEKLY   Labwork: BMET IN 1 WEEK   Testing/Procedures: NONE  Follow-Up: 10/06/2020 AT 9:30 WITH PHARM D AT [place] have been referred to  [facility] Address:[address] [place] [address] Phone:[phone]  IF YOU DO NOT HEAR FROM THEM IN 2 WEEKS CALL THEM DIRECTLY AT NUMBER ABOVE   Any Other Special Instructions Will Be Listed Below (If Applicable). MONITOR YOUR BLOOD PRESSURE CALL THE OFFICE IN 1-2 WEEKS WITH YOUR READINGS TO MAKE SURE THE CLONIDINE DOES NOT NEED TO BE CHANGED PRIOR TO YOUR FOLLOW UP

## 2020-09-05 NOTE — Telephone Encounter (Signed)
Discussed with patient at visit this week  Referral placed for endocrinologist in [place]  Also patient stated she has been having stress incontinence  Ok per Dr Oval Linsey to refer to urology, referral placed

## 2020-09-08 ENCOUNTER — Other Ambulatory Visit: Payer: Self-pay | Admitting: *Deleted

## 2020-09-08 ENCOUNTER — Other Ambulatory Visit: Payer: Self-pay | Admitting: Family Medicine

## 2020-09-08 NOTE — Patient Outreach (Signed)
[name] [facility]) Care Management  [place]  09/08/2020   [name] [name] 11/18/1945 [id number]  Subjective: New pt referral from [place]. Pt agreed to [facility] after lengthy discussion about services, offer of BP monitor and HTN education.  Other medical hx: includes CAD, NSTEMI (pt denies), sleep apnea, hypothyroidism   Encounter Medications:  Outpatient Encounter Medications as of 09/08/2020  Medication Sig   carvedilol (COREG) 25 MG tablet Take 1 tablet (25 mg total) by mouth 2 (two) times daily.   chlorthalidone (HYGROTON) 25 MG tablet Take 1 tablet (25 mg total) by mouth daily.   cloNIDine (CATAPRES - DOSED IN MG/24 HR) 0.3 mg/24hr patch Place 1 patch (0.3 mg total) onto the skin once a week.   diclofenac Sodium (VOLTAREN) 1 % GEL Apply 2 g topically 4 (four) times daily as needed.   levothyroxine (SYNTHROID) 125 MCG tablet Take 125 mcg by mouth daily before breakfast. 1 HOUR PRIOR TO OTHER MEDICATIONS   nitroGLYCERIN (NITROSTAT) 0.4 MG SL tablet TAKE 1 TABLET UNDER THE TONGUE EVERY 5 MINS FOR CHEST PAIN - AFTER 2 DOSES GO TO ED   albuterol (ACCUNEB) 1.25 MG/3ML nebulizer solution SMARTSIG:3 Milliliter(s) By Mouth Every 6 Hours PRN (Patient not taking: Reported on 09/08/2020)   spironolactone (ALDACTONE) 25 MG tablet Take 1 tablet (25 mg total) by mouth daily. (Patient not taking: Reported on 09/08/2020)   [DISCONTINUED] promethazine-dextromethorphan (PROMETHAZINE-DM) 6.25-15 MG/5ML syrup Take 5 mLs by mouth at bedtime as needed. (Patient not taking: No sig reported)   No facility-administered encounter medications on file as of 09/08/2020.   Fall/Depression Screening: Fall Risk  11/06/2013  Falls in the past year? No   PHQ 2/9 Scores 09/08/2020 11/06/2013  PHQ - 2 Score 0 0    Assessment: HTN treated but not at goal per pt report  Care Plan   Goals Addressed             This Visit's Progress    Improve My Heart Health-Coronary Artery Disease  by being willing to discuss ways to reduce risk factors per NP notes each month X 3..       Timeframe:  Long-Range Goal Priority:  High Start Date:     09/08/20                        Expected End Date:    12/08/20                   Follow Up Date 9//622   - be open to making changes - I can manage, know and watch for signs of a heart attack - if I have chest pain, call for help - learn about small changes that will make a big difference - learn my personal risk factors    Why is this important?   Lifestyle changes are key to improving the blood flow to your heart. Think about the things you can change and set a goal to live healthy.  Remember, when the blood vessels to your heart start to get clogged you may not have any symptoms.  Over time, they can get worse.  Don't ignore the signs, like chest pain, and get help right away.     Notes:  09/08/20 Pt agrees to work on improving her CV health. She has lost 30# over a year. She exercises > 150 min/week. She does not eat meat. Takes her meds although she expresses distrust in medications and  medical providers in general. We will focus on diet which she already minimizes salt.      Track and Manage My Blood Pressure-Hypertension as evidenced by reporting to NP calls over the next 37 days.       Timeframe:  Short-Term Goal Priority:  High Start Date:     09/08/20                        Expected End Date:   10/14/20                    Follow Up Date 10/14/20    - check blood pressure daily - write blood pressure results in a log or diary    Why is this important?   You won't feel high blood pressure, but it can still hurt your blood vessels.  High blood pressure can cause heart or kidney problems. It can also cause a stroke.  Making lifestyle changes like losing a little weight or eating less salt will help.  Checking your blood pressure at home and at different times of the day can help to control blood pressure.  If the doctor prescribes  medicine remember to take it the way the doctor ordered.  Call the office if you cannot afford the medicine or if there are questions about it.     Notes: 09/08/20 Pt agrees to work with me in improving her blood pressure. NP to send a BP cuff and BP information and a calendar to log her readings. Reviewed that she does take her meds and minimizes salt.        Plan:  Follow-up: Patient agrees to Care Plan and Follow-up. Follow-up in 2 week(s)  Kayleen Memos C. [name], MSN, [facility] Gerontological Nurse Practitioner [facility] Care Management [phone]

## 2020-09-09 ENCOUNTER — Encounter: Payer: Self-pay | Admitting: *Deleted

## 2020-09-10 DIAGNOSIS — I10 Essential (primary) hypertension: Secondary | ICD-10-CM | POA: Diagnosis not present

## 2020-09-11 LAB — BASIC METABOLIC PANEL
BUN/Creatinine Ratio: 19 (ref 12–28)
BUN: 22 mg/dL (ref 8–27)
CO2: 23 mmol/L (ref 20–29)
Calcium: 9.7 mg/dL (ref 8.7–10.3)
Chloride: 100 mmol/L (ref 96–106)
Creatinine, Ser: 1.16 mg/dL — ABNORMAL HIGH (ref 0.57–1.00)
Glucose: 87 mg/dL (ref 65–99)
Potassium: 4.5 mmol/L (ref 3.5–5.2)
Sodium: 143 mmol/L (ref 134–144)
eGFR: 49 mL/min/{1.73_m2} — ABNORMAL LOW (ref >59)

## 2020-09-15 ENCOUNTER — Telehealth (HOSPITAL_BASED_OUTPATIENT_CLINIC_OR_DEPARTMENT_OTHER): Payer: Self-pay | Admitting: Cardiovascular Disease

## 2020-09-15 ENCOUNTER — Encounter: Payer: Self-pay | Admitting: Physician Assistant

## 2020-09-15 NOTE — Telephone Encounter (Signed)
Spoke with patient regarding the Friday 10/10/20 8:30 am appointment with Ellouise Newer at [facility]--[address]---phone [phone] .  Patient voiced her understanding.

## 2020-09-18 ENCOUNTER — Telehealth: Payer: Self-pay | Admitting: Cardiology

## 2020-09-18 ENCOUNTER — Telehealth (HOSPITAL_BASED_OUTPATIENT_CLINIC_OR_DEPARTMENT_OTHER): Payer: Self-pay | Admitting: Cardiovascular Disease

## 2020-09-18 NOTE — Telephone Encounter (Signed)
Returned call to patient of Dr. Oval Linsey  She reports she has been checking her BP  138/75 139/79  Previously had been in the 160s-170s  She just wanted to report the improvement in her readings  Routed to MD/LPN as Juluis Rainier

## 2020-09-18 NOTE — Telephone Encounter (Signed)
Patient called and wants to talk to Dr. Blenda Mounts Nurse. The patient only wanted to talk to the Nurse

## 2020-09-18 NOTE — Telephone Encounter (Signed)
Patient came into office thinking she had her apt w/ Dr. [name] this morning and requested that one of the nurses check her BP.    Pt states that she's been having low BP - she was unable to pull them up she's going to call and give the readings when she gets home.

## 2020-09-26 ENCOUNTER — Other Ambulatory Visit: Payer: Self-pay | Admitting: *Deleted

## 2020-09-26 NOTE — Patient Outreach (Signed)
[facility] [facility]) Care Management  [place]  09/26/2020   RONNESHIA CORRIE 08/27/1945 [id number]  Subjective:  Telephone follow up for HTN  Encounter Medications:  Outpatient Encounter Medications as of 09/26/2020  Medication Sig   albuterol (ACCUNEB) 1.25 MG/3ML nebulizer solution SMARTSIG:3 Milliliter(s) By Mouth Every 6 Hours PRN (Patient not taking: Reported on 09/08/2020)   carvedilol (COREG) 25 MG tablet Take 1 tablet (25 mg total) by mouth 2 (two) times daily.   chlorthalidone (HYGROTON) 25 MG tablet Take 1 tablet (25 mg total) by mouth daily.   cloNIDine (CATAPRES - DOSED IN MG/24 HR) 0.3 mg/24hr patch Place 1 patch (0.3 mg total) onto the skin once a week.   diclofenac Sodium (VOLTAREN) 1 % GEL Apply 2 g topically 4 (four) times daily as needed.   levothyroxine (SYNTHROID) 125 MCG tablet Take 125 mcg by mouth daily before breakfast. 1 HOUR PRIOR TO OTHER MEDICATIONS   nitroGLYCERIN (NITROSTAT) 0.4 MG SL tablet TAKE 1 TABLET UNDER THE TONGUE EVERY 5 MINS FOR CHEST PAIN - AFTER 2 DOSES GO TO ED   spironolactone (ALDACTONE) 25 MG tablet Take 1 tablet (25 mg total) by mouth daily. (Patient not taking: Reported on 09/08/2020)   No facility-administered encounter medications on file as of 09/26/2020.   Fall/Depression Screening: Fall Risk  11/06/2013  Falls in the past year? No   PHQ 2/9 Scores 09/08/2020 11/06/2013  PHQ - 2 Score 0 0    Assessment: HTN - controlled but needs frequent monitoring  Care Plan   Goals Addressed             This Visit's Progress    Improve My Heart Health-Coronary Artery Disease by being willing to discuss ways to reduce risk factors per NP notes each month X 3..       Timeframe:  Long-Range Goal Priority:  High Start Date:     09/08/20                        Expected End Date:    12/08/20                   Follow Up Date 9//19/22   - be open to making changes - I can manage, know and watch for signs of a heart attack -  if I have chest pain, call for help - learn about small changes that will make a big difference - learn my personal risk factors    Why is this important?   Lifestyle changes are key to improving the blood flow to your heart. Think about the things you can change and set a goal to live healthy.  Remember, when the blood vessels to your heart start to get clogged you may not have any symptoms.  Over time, they can get worse.  Don't ignore the signs, like chest pain, and get help right away.     Notes:  09/08/20 Pt agrees to work on improving her CV health. She has lost 30# over a year. She exercises > 150 min/week. She does not eat meat. Takes her meds although she expresses distrust in medications and medical providers in general. We will focus on diet which she already minimizes salt. 09/26/20 Has received the information I have sent her on DASH diet and Carb counting. She has lost wt over the last year. Was wearing a size 16 and is now wearing a size 12! She does not have a scale.  Current wt from MD office 175# 0n 09/03/20. Encouraged her to continue what she is doing!      Track and Manage My Blood Pressure-Hypertension as evidenced by reporting to NP calls over the next 37 days.       Timeframe:  Short-Term Goal Priority:  High Start Date:     09/08/20                        Expected End Date:   10/14/20                    Follow Up Date 9/19//22    - check blood pressure daily - write blood pressure results in a log or diary    Why is this important?   You won't feel high blood pressure, but it can still hurt your blood vessels.  High blood pressure can cause heart or kidney problems. It can also cause a stroke.  Making lifestyle changes like losing a little weight or eating less salt will help.  Checking your blood pressure at home and at different times of the day can help to control blood pressure.  If the doctor prescribes medicine remember to take it the way the doctor ordered.  Call  the office if you cannot afford the medicine or if there are questions about it.     Notes: 09/26/20  Pt has received her BP cuff and is checking her BP daily. Most of the time her systolic is in the [id number] and her diastolic readings are in the 89-90s range. Encouraged to continue her daily monitoring and to take her log with her to her [facility] OVs. 09/08/20 Pt agrees to work with me in improving her blood pressure. NP to send a BP cuff and BP information and a calendar to log her readings. Reviewed that she does take her meds and minimizes salt.        Plan:  Follow-up: Patient agrees to Care Plan and Follow-up. Follow-up in 1 month(s)  Kasee Hantz C. [name], MSN, [facility] Gerontological Nurse Practitioner [facility] Care Management [phone]

## 2020-10-06 ENCOUNTER — Ambulatory Visit: Payer: Medicare HMO

## 2020-10-06 NOTE — Progress Notes (Signed)
H&P  Chief Complaint: Urinary incontinence, back pain  History of Present Illness: [name] [name] is a 75 y.o. year old female presents for evaluation and management of incontinence, mainly with coughing and sneezing as well as back pain.  Back pain is not currently a significant issue.  It is sporadic.  Not associated with left or right side.  Not associated with gross hematuria.  No prior history of kidney stones.  Patient does experience urinary leakage, mainly with coughing and sneezing.  She states that it is worse 2 or 3 times a year when she has sinusitis.  She has a good urinary stream, feels like she empties well.  She does have some urgency and rare urgency incontinence.  Because of the stress incontinence, she wears pull-ups.  She changes these a couple times a day.  She wears it at night as well.  Usually, they are just damp.  Past Medical History:  Diagnosis Date   Anxiety    Dysphagia 09/03/2020   Essential hypertension    Hypothyroidism    NSTEMI (non-ST elevated myocardial infarction) ([place]) 2015   a.  Related to occlusion of the distal Cx. The mid to distal OM1 also contains a significant stenosis that is best treated with medical therapy. Patent RCA/LAD with tortuosity. LM patent. EF 55%.    Sleep apnea     Past Surgical History:  Procedure Laterality Date   ABDOMINAL HYSTERECTOMY     CARDIAC CATHETERIZATION  ~ 2000   CORONARY ANGIOPLASTY  09/20/2013   LEFT HEART CATHETERIZATION WITH CORONARY ANGIOGRAM N/A 09/20/2013   Procedure: LEFT HEART CATHETERIZATION WITH CORONARY ANGIOGRAM;  Surgeon: Sinclair Grooms, MD;  Location: [facility] CATH LAB;  Service: Cardiovascular;  Laterality: N/A;   LEFT HEART CATHETERIZATION WITH CORONARY ANGIOGRAM N/A 09/27/2013   Procedure: LEFT HEART CATHETERIZATION WITH CORONARY ANGIOGRAM;  Surgeon: Blane Ohara, MD;  Location: [facility] CATH LAB;  Service: Cardiovascular;  Laterality: N/A;   THYROIDECTOMY  10/08/2011   Procedure: THYROIDECTOMY;  Surgeon:  [name], MD;  Location: [facility] ORS;  Service: General;  Laterality: N/A;  Total   VAGINAL HYSTERECTOMY  1993    Home Medications:  (Not in a hospital admission)   Allergies:  Allergies  Allergen Reactions   Fluviral [Influenza Virus Vaccine] Shortness Of Breath, Swelling and Other (See Comments)    Including back pain that radiates in upper extremeties   Haemophilus Influenzae Other (See Comments), Shortness Of Breath and Swelling    Including back pain that radiates in upper extremeties   Amlodipine     Lip swelling per patient.    Lisinopril     Dr. Legrand Rams stopped due to side effects per patient.  Could remember what side effects were.     Ranexa [Ranolazine]     Chest pain    Family History  Problem Relation Age of Onset   Colon cancer Father     Social History:  reports that she has never smoked. She has never used smokeless tobacco. She reports that she does not drink alcohol and does not use drugs.  ROS: A complete review of systems was performed.  All systems are negative except for pertinent findings as noted.  Physical Exam:  Vital signs in last 24 hours: '@VSRANGES'$ @ General:  Alert and oriented, No acute distress HEENT: Normocephalic, atraumatic Neck: No JVD or lymphadenopathy Cardiovascular: Regular rate  Lungs: Normal inspiratory/expiratory excursion Abdomen: Soft, nontender, nondistended, no abdominal masses.  Well-healed lower midline incision.  Abdomen mildly obese.  No abdominal masses. Vaginal exam: Mild cystocele.  Urethral meatus normal.  Good posterior support.  No masses. Back: No CVA tenderness Extremities: No edema Neurologic: Grossly intact  I have reviewed prior pt notes  I have reviewed notes from referring/previous physicians  I have reviewed urinalysis results    Impression/Assessment:  1.  Back pain, none GU etiology  2.  Stress urinary incontinence.  Mild to moderate in nature.  Plan:  I think the patient will benefit from  physical therapy consultation.  We will set up an appointment for her to see pelvic floor therapy in [facility] at [facility] urology.  [name] 10/06/2020, 8:02 PM  [name] MD

## 2020-10-07 ENCOUNTER — Other Ambulatory Visit: Payer: Self-pay

## 2020-10-07 ENCOUNTER — Ambulatory Visit (INDEPENDENT_AMBULATORY_CARE_PROVIDER_SITE_OTHER): Payer: Medicare HMO | Admitting: Urology

## 2020-10-07 ENCOUNTER — Encounter: Payer: Self-pay | Admitting: Urology

## 2020-10-07 VITALS — BP 154/85 | HR 60

## 2020-10-07 DIAGNOSIS — N393 Stress incontinence (female) (male): Secondary | ICD-10-CM | POA: Diagnosis not present

## 2020-10-07 DIAGNOSIS — J0141 Acute recurrent pansinusitis: Secondary | ICD-10-CM | POA: Diagnosis not present

## 2020-10-07 DIAGNOSIS — M545 Low back pain, unspecified: Secondary | ICD-10-CM

## 2020-10-07 DIAGNOSIS — G8929 Other chronic pain: Secondary | ICD-10-CM | POA: Diagnosis not present

## 2020-10-07 LAB — URINALYSIS, ROUTINE W REFLEX MICROSCOPIC
Bilirubin, UA: NEGATIVE
Glucose, UA: NEGATIVE
Ketones, UA: NEGATIVE
Leukocytes,UA: NEGATIVE
Nitrite, UA: NEGATIVE
Protein,UA: NEGATIVE
RBC, UA: NEGATIVE
Specific Gravity, UA: 1.01 (ref 1.005–1.030)
Urobilinogen, Ur: 0.2 mg/dL (ref 0.2–1.0)
pH, UA: 6.5 (ref 5.0–7.5)

## 2020-10-07 NOTE — Progress Notes (Signed)
Urological Symptom Review  Patient is experiencing the following symptoms: Get up at night to urinate Leakage of urine   Review of Systems  Gastrointestinal (upper)  : Negative for upper GI symptoms  Gastrointestinal (lower) : Negative for lower GI symptoms  Constitutional : Negative for symptoms  Skin: Negative for skin symptoms  Eyes: Negative for eye symptoms  Ear/Nose/Throat : Sinus problems  Hematologic/Lymphatic: Negative for Hematologic/Lymphatic symptoms  Cardiovascular : Negative for cardiovascular symptoms  Respiratory : Cough  Endocrine: Negative for endocrine symptoms  Musculoskeletal: Back pain  Neurological: Headaches  Psychologic: Negative for psychiatric symptoms

## 2020-10-10 ENCOUNTER — Ambulatory Visit: Payer: Medicare HMO | Admitting: Physician Assistant

## 2020-10-27 ENCOUNTER — Other Ambulatory Visit: Payer: Self-pay | Admitting: *Deleted

## 2020-10-29 ENCOUNTER — Other Ambulatory Visit: Payer: Self-pay | Admitting: *Deleted

## 2020-10-29 NOTE — Patient Outreach (Signed)
[facility]) Care Management  [place]  10/29/2020   [name] [name] 02/14/1945 [id number]  Subjective: Telephone outreach to follow up on pt HTN management. She is checking her BP and she is eating healthy. She does not, however, believe in completing recommended diagnostic evaluations or vaccinations. She is very clear is stating that she does not have faith in medical recommendations for things that she knows are harmful. She is self directed in her care and will do what she feels is right for her.     Encounter Medications:  Outpatient Encounter Medications as of 10/29/2020  Medication Sig   albuterol (ACCUNEB) 1.25 MG/3ML nebulizer solution    carvedilol (COREG) 25 MG tablet Take 1 tablet (25 mg total) by mouth 2 (two) times daily.   chlorthalidone (HYGROTON) 25 MG tablet Take 1 tablet (25 mg total) by mouth daily.   cloNIDine (CATAPRES - DOSED IN MG/24 HR) 0.3 mg/24hr patch Place 1 patch (0.3 mg total) onto the skin once a week.   diclofenac Sodium (VOLTAREN) 1 % GEL Apply 2 g topically 4 (four) times daily as needed.   levothyroxine (SYNTHROID) 125 MCG tablet Take 125 mcg by mouth daily before breakfast. 1 HOUR PRIOR TO OTHER MEDICATIONS   nitroGLYCERIN (NITROSTAT) 0.4 MG SL tablet TAKE 1 TABLET UNDER THE TONGUE EVERY 5 MINS FOR CHEST PAIN - AFTER 2 DOSES GO TO ED   spironolactone (ALDACTONE) 25 MG tablet Take 1 tablet (25 mg total) by mouth daily.   No facility-administered encounter medications on file as of 10/29/2020.    Functional Status:  In your present state of health, do you have any difficulty performing the following activities: 09/26/2020  Hearing? N  Vision? N  Difficulty concentrating or making decisions? N  Walking or climbing stairs? N  Dressing or bathing? N  Doing errands, shopping? N  Preparing Food and eating ? N  Using the Toilet? N  Do you have problems with loss of bowel control? N  Managing your Medications? N  Managing  your Finances? N  Housekeeping or managing your Housekeeping? N  Some recent data might be hidden    Fall/Depression Screening: Fall Risk  11/06/2013  Falls in the past year? No   PHQ 2/9 Scores 09/08/2020 11/06/2013  PHQ - 2 Score 0 0    Assessment: HTN - controlled and self monitored at home.  Care Plan   Goals Addressed             This Visit's Progress    COMPLETED: Improve My Heart Health-Coronary Artery Disease by being willing to discuss ways to reduce risk factors per NP notes each month X 3..       Timeframe:  Long-Range Goal Priority:  High Start Date:     09/08/20                        Expected End Date:    12/08/20                   Follow Up Date 9//19/22   - be open to making changes - I can manage, know and watch for signs of a heart attack - if I have chest pain, call for help - learn about small changes that will make a big difference - learn my personal risk factors    Why is this important?   Lifestyle changes are key to improving the blood flow to your heart. Think about  the things you can change and set a goal to live healthy.  Remember, when the blood vessels to your heart start to get clogged you may not have any symptoms.  Over time, they can get worse.  Don't ignore the signs, like chest pain, and get help right away.     Notes:  09/08/20 Pt agrees to work on improving her CV health. She has lost 30# over a year. She exercises > 150 min/week. She does not eat meat. Takes her meds although she expresses distrust in medications and medical providers in general. We will focus on diet which she already minimizes salt. 09/26/20 Has received the information I have sent her on DASH diet and Carb counting. She has lost wt over the last year. Was wearing a size 16 and is now wearing a size 12! She does not have a scale. Current wt from MD office 175# 0n 09/03/20. Encouraged her to continue what she is doing! 10/29/20 Pt is willing to manage her CAD via good diet and  she does eat fresh foods, steamed vegetables. She does admit to adding some salt to her food. Discussed low na diet is recommended for high blood pressure, not to exceed 2000 Gms a day. Pt voices concern that she may not be getting enough na as all foods have been stripped of their nutrition through process. Reinforced that most processed foods have salt and labels should be read to choose those that have the least. She states she does not eat a lot of bread and chooses [employer] or [employer]. She eats appropriate sized portions of carbs. She is a busy lady looking after several grandchildren.     COMPLETED: Track and Manage My Blood Pressure-Hypertension as evidenced by reporting to NP calls over the next 37 days.   On track    Timeframe:  Short-Term Goal Priority:  High Start Date:     09/08/20                        Expected End Date:   10/14/20                    Follow Up Date 9/19//22    - check blood pressure daily - write blood pressure results in a log or diary    Why is this important?   You won't feel high blood pressure, but it can still hurt your blood vessels.  High blood pressure can cause heart or kidney problems. It can also cause a stroke.  Making lifestyle changes like losing a little weight or eating less salt will help.  Checking your blood pressure at home and at different times of the day can help to control blood pressure.  If the doctor prescribes medicine remember to take it the way the doctor ordered.  Call the office if you cannot afford the medicine or if there are questions about it.     Notes: 10/29/20 Checking BP most days and her readings are usually <140/90. She is most appreciative of the blood pressure cuff. 09/26/20  Pt has received her BP cuff and is checking her BP daily. Most of the time her systolic is in the 258N and her diastolic readings are in the 89-90s range. Encouraged to continue her daily monitoring and to take her log with her to her [facility] O[facility]. 09/08/20 Pt  agrees to work with me in improving her blood pressure. NP to send a BP cuff and BP  information and a calendar to log her readings. Reviewed that she does take her meds and minimizes salt.        Plan: We agreed to close her case today due to her personal beliefs about medical recommendations. I have encouraged her to call me if she has questions. Follow-up: Patient requests no follow-up at this time.  [name]. [name], MSN, [facility] Gerontological Nurse Practitioner [facility] Care Management [phone]

## 2020-10-29 NOTE — Patient Outreach (Signed)
[facility]) Care Management  10/29/2020  KELCEY KORUS 08/01/1945 [id number]  Telephone outreach unanswered. Left a message to return my call.  [name]. [name], MSN, [facility] Gerontological Nurse Practitioner [facility] Care Management [phone]

## 2020-11-02 DIAGNOSIS — J00 Acute nasopharyngitis [common cold]: Secondary | ICD-10-CM | POA: Diagnosis not present

## 2020-11-03 ENCOUNTER — Ambulatory Visit: Payer: Medicare HMO | Admitting: Internal Medicine

## 2020-11-17 ENCOUNTER — Encounter: Payer: Self-pay | Admitting: *Deleted

## 2020-11-18 ENCOUNTER — Encounter: Payer: Self-pay | Admitting: Cardiology

## 2020-11-18 ENCOUNTER — Ambulatory Visit (INDEPENDENT_AMBULATORY_CARE_PROVIDER_SITE_OTHER): Payer: Medicare HMO | Admitting: Cardiology

## 2020-11-18 ENCOUNTER — Other Ambulatory Visit: Payer: Self-pay | Admitting: *Deleted

## 2020-11-18 VITALS — BP 168/100 | HR 60 | Ht 63.0 in | Wt 174.2 lb

## 2020-11-18 DIAGNOSIS — E782 Mixed hyperlipidemia: Secondary | ICD-10-CM

## 2020-11-18 DIAGNOSIS — I10 Essential (primary) hypertension: Secondary | ICD-10-CM

## 2020-11-18 DIAGNOSIS — I25119 Atherosclerotic heart disease of native coronary artery with unspecified angina pectoris: Secondary | ICD-10-CM | POA: Diagnosis not present

## 2020-11-18 MED ORDER — CLONIDINE 0.2 MG/24HR TD PTWK: 0.2000 mg | MEDICATED_PATCH | TRANSDERMAL | 6 refills | Status: DC

## 2020-11-18 NOTE — Patient Instructions (Addendum)
Medication Instructions:  Your physician has recommended you make the following change in your medication:  Decrease clonidine 0.2 mg/24 hour patch  Continue other medications the same  Labwork: Your physician recommends that you return for a FASTING lipid profile as soon as possible. Please do not eat or drink for at least 8 hours when you have this done. You may take your medications that morning with a sip of water. This may be done at [facility] or [facility].  Testing/Procedures: none  Follow-Up: Your physician recommends that you schedule a follow-up appointment in: 6 months  Any Other Special Instructions Will Be Listed Below (If Applicable).  If you need a refill on your cardiac medications before your next appointment, please call your pharmacy.

## 2020-11-18 NOTE — Progress Notes (Signed)
Cardiology Office Note  Date: 11/18/2020   ID: [name], [name] 03-29-1945, MRN [id number]  PCP:  Pcp, No  Cardiologist:  [name], MD Electrophysiologist:  None   Chief Complaint  Patient presents with   Cardiac follow-up    History of Present Illness: [name] [name] is a 75 y.o. female last seen in June.  She is here for a routine visit.  From a cardiac perspective, she does not report any definite angina symptoms or nitroglycerin use.  Does have occasional discomfort in her neck and some dysphagia.  She had a visit with Dr. Oval Linsey in the hypertension clinic back in July, I reviewed the note.  At that time regimen included Coreg, clonidine, chlorthalidone, and Aldactone.  She tells me that she is having trouble with her clonidine patch, makes her feel unusual and sometimes has to take it off before completing the full week.  We discussed reducing the dose to 0.2 mg per 24 hours.  She tells me that when she checks her blood pressure at home and is typically systolic 242-353 over diastolic 61-44.  This trend is much better overall.  Today's blood pressure is higher in clinic.  Personally reviewed her ECG which shows sinus rhythm with increased voltage and nonspecific ST changes.  She is not on aspirin, states that she is concerned about bleeding.  Also has intolerances to statins and Zetia.  We have discussed the possibility of PCSK9 inhibitor, but she is hesitant to take other medications.  I did talk with her about diet and increased fiber.  Past Medical History:  Diagnosis Date   Anxiety    Dysphagia 09/03/2020   Essential hypertension    Hypothyroidism    NSTEMI (non-ST elevated myocardial infarction) ([place]) 2015   a.  Related to occlusion of the distal Cx. The mid to distal OM1 also contains a significant stenosis that is best treated with medical therapy. Patent RCA/LAD with tortuosity. LM patent. EF 55%.    Sleep apnea     Past Surgical History:   Procedure Laterality Date   ABDOMINAL HYSTERECTOMY     CARDIAC CATHETERIZATION  ~ 2000   CORONARY ANGIOPLASTY  09/20/2013   LEFT HEART CATHETERIZATION WITH CORONARY ANGIOGRAM N/A 09/20/2013   Procedure: LEFT HEART CATHETERIZATION WITH CORONARY ANGIOGRAM;  Surgeon: Sinclair Grooms, MD;  Location: [facility] CATH LAB;  Service: Cardiovascular;  Laterality: N/A;   LEFT HEART CATHETERIZATION WITH CORONARY ANGIOGRAM N/A 09/27/2013   Procedure: LEFT HEART CATHETERIZATION WITH CORONARY ANGIOGRAM;  Surgeon: Blane Ohara, MD;  Location: [facility] C[facility] LAB;  Service: Cardiovascular;  Laterality: N/A;   THYROIDECTOMY  10/08/2011   Procedure: THYROIDECTOMY;  Surgeon: [name], MD;  Location: [facility] ORS;  Service: General;  Laterality: N/A;  Total   VAGINAL HYSTERECTOMY  1993    Current Outpatient Medications  Medication Sig Dispense Refill   albuterol (ACCUNEB) 1.25 MG/3ML nebulizer solution      carvedilol (COREG) 25 MG tablet Take 1 tablet (25 mg total) by mouth 2 (two) times daily. 180 tablet 1   chlorthalidone (HYGROTON) 25 MG tablet Take 1 tablet (25 mg total) by mouth daily. 90 tablet 1   cloNIDine (CATAPRES - DOSED IN MG/24 HR) 0.2 mg/24hr patch Place 1 patch (0.2 mg total) onto the skin once a week. 4 patch 6   diclofenac Sodium (VOLTAREN) 1 % GEL Apply 2 g topically 4 (four) times daily as needed. 50 g 0   levothyroxine (SYNTHROID) 125 MCG tablet Take  125 mcg by mouth daily before breakfast. 1 HOUR PRIOR TO OTHER MEDICATIONS     nitroGLYCERIN (NITROSTAT) 0.4 MG SL tablet TAKE 1 TABLET UNDER THE TONGUE EVERY 5 MINS FOR CHEST PAIN - AFTER 2 DOSES GO TO ED 25 tablet 3   spironolactone (ALDACTONE) 25 MG tablet Take 1 tablet (25 mg total) by mouth daily. 90 tablet 3   No current facility-administered medications for this visit.   Allergies:  Fluviral [influenza virus vaccine], Haemophilus influenzae, Amlodipine, Lisinopril, and Ranexa [ranolazine]   ROS: No palpitations or syncope.  Physical Exam: VS:   BP (!) 168/100   Pulse 60   Ht 5\' 3"  (1.6 m)   Wt 174 lb 3.2 oz (79 kg)   SpO2 97%   BMI 30.86 kg/m , BMI Body mass index is 30.86 kg/m.  Wt Readings from Last 3 Encounters:  11/18/20 174 lb 3.2 oz (79 kg)  09/03/20 175 lb (79.4 kg)  07/21/20 178 lb (80.7 kg)    General: Patient appears comfortable at rest. HEENT: Conjunctiva and lids normal, wearing a mask. Neck: Supple, no elevated JVP or carotid bruits, no thyromegaly. Lungs: Clear to auscultation, nonlabored breathing at rest. Cardiac: Regular rate and rhythm, no S3 or significant systolic murmur. Extremities: No pitting edema.  ECG:  An ECG dated 05/09/2020 was personally reviewed today and demonstrated:  Sinus rhythm with nonspecific ST changes and poor R wave progression.  Recent Labwork: 01/23/2020: Hemoglobin 15.5; Platelets 550 07/28/2020: TSH 0.400 09/10/2020: BUN 22; Creatinine, Ser 1.16; Potassium 4.5; Sodium 143     Component Value Date/Time   CHOL 194 09/04/2019 1216   TRIG 63 09/04/2019 1216   HDL 54 09/04/2019 1216   CHOLHDL 3.6 09/04/2019 1216   VLDL 13 09/04/2019 1216   LDLCALC 127 (H) 09/04/2019 1216    Other Studies Reviewed Today:  Echocardiogram 09/21/2013: - Left ventricle: The cavity size was normal. There was mild    concentric hypertrophy. Systolic function was normal. The    estimated ejection fraction was in the range of 55% to 60%. Wall    motion was normal; there were no regional wall motion    abnormalities. Doppler parameters are consistent with abnormal    left ventricular relaxation (grade 1 diastolic dysfunction).  - Left atrium: The atrium was mildly dilated.  - Pulmonary arteries: Systolic pressure was mildly increased. PA    peak pressure: 37 mm Hg (S).  - Pericardium, extracardiac: A small pericardial effusion was    identified along the right ventricular free wall.    Renal artery Dopplers 04/24/2020: Summary:  Largest Aortic Diameter: 2.3 cm     Renal:     Right: Normal size  right kidney. Normal cortical thickness of right         kidney. Normal right Resisitive Index. No evidence of right         renal artery stenosis. RRV flow present.  Left:  Normal size of left kidney. Normal left Resistive Index.         Normal cortical thickness of the left kidney. No evidence of         left renal artery stenosis. LRV flow present.  Mesenteric:  Normal Celiac artery and Superior Mesenteric artery findings.  IVC is patent.  Assessment and Plan:  1.  Essential hypertension, difficult to control in the setting of medication intolerances and compliance over time.  Present regimen does look to be making a difference however.  Plan to continue Coreg, chlorthalidone, Aldactone, and reduce  clonidine patch to 0.2 mg per 24 hours to see if she tolerates that better.  Sodium restriction discussed.  Keep follow-up with Dr. Oval Linsey in the hypertension clinic.  2.  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  She does not report any nitroglycerin use.  As discussed above, she is hesitant to use aspirin given concerns about bleeding risk, also intolerant to statins and Zetia.  She is not interested in other medications such as PCSK9 inhibitor at this time.  She states that she has been following her diet more carefully.  We will check a follow-up FLP.  Medication Adjustments/Labs and Tests Ordered: Current medicines are reviewed at length with the patient today.  Concerns regarding medicines are outlined above.   Tests Ordered: Orders Placed This Encounter  Procedures   Lipid panel   EKG 12-Lead    Medication Changes: Meds ordered this encounter  Medications   cloNIDine (CATAPRES - DOSED IN MG/24 HR) 0.2 mg/24hr patch    Sig: Place 1 patch (0.2 mg total) onto the skin once a week.    Dispense:  4 patch    Refill:  6    11/18/2020 dose decrease     Disposition:  Follow up  6 months.  Signed, Satira Sark, MD, [facility] 11/18/2020  10:39 AM    [facility] [place], [place], [place] [address] Phone: [phone]; Fax: [phone]

## 2020-11-20 ENCOUNTER — Telehealth: Payer: Self-pay

## 2020-11-20 NOTE — Telephone Encounter (Signed)
Call to pt to inform of new PREP class starting closer to [facility]  Patient is not interested in participating. Losing weight on own 23lbs so far and BP is somewhat better.  Encouraged her to continue

## 2020-11-21 ENCOUNTER — Encounter: Payer: Self-pay | Admitting: Endocrinology

## 2020-11-21 ENCOUNTER — Ambulatory Visit (INDEPENDENT_AMBULATORY_CARE_PROVIDER_SITE_OTHER): Payer: Medicare HMO | Admitting: Endocrinology

## 2020-11-21 ENCOUNTER — Other Ambulatory Visit: Payer: Self-pay

## 2020-11-21 VITALS — BP 130/60 | HR 62 | Ht 63.0 in | Wt 173.6 lb

## 2020-11-21 DIAGNOSIS — E042 Nontoxic multinodular goiter: Secondary | ICD-10-CM | POA: Diagnosis not present

## 2020-11-21 DIAGNOSIS — E890 Postprocedural hypothyroidism: Secondary | ICD-10-CM

## 2020-11-21 LAB — T4, FREE: Free T4: 1.36 ng/dL (ref 0.60–1.60)

## 2020-11-21 LAB — TSH: TSH: 2.36 u[IU]/mL (ref 0.35–5.50)

## 2020-11-21 NOTE — Progress Notes (Signed)
Subjective:    Patient ID: [name] [name], female    DOB: 1945/09/30, 75 y.o.   MRN: [id number]  HPI Pt is referred by Dr Oval Linsey, for hypothyroidism.  Pt reports she had left thyroid lobect in 2013.  She was rx'ed synthroid 125/d, and she says the dosage has not change since then.  she has never taken kelp or any other type of non-prescribed thyroid product.  she has not recently had thyroid imaging.  she has never had XRT to the neck.  she has never been on amiodarone or lithium.  She has insomnia and dry skin.   Past Medical History:  Diagnosis Date   Anxiety    Dysphagia 09/03/2020   Essential hypertension    Hypothyroidism    NSTEMI (non-ST elevated myocardial infarction) ([place]) 2015   a.  Related to occlusion of the distal Cx. The mid to distal OM1 also contains a significant stenosis that is best treated with medical therapy. Patent RCA/LAD with tortuosity. LM patent. EF 55%.    Sleep apnea     Past Surgical History:  Procedure Laterality Date   ABDOMINAL HYSTERECTOMY     CARDIAC CATHETERIZATION  ~ 2000   CORONARY ANGIOPLASTY  09/20/2013   LEFT HEART CATHETERIZATION WITH CORONARY ANGIOGRAM N/A 09/20/2013   Procedure: LEFT HEART CATHETERIZATION WITH CORONARY ANGIOGRAM;  Surgeon: Sinclair Grooms, MD;  Location: [facility] CATH LAB;  Service: Cardiovascular;  Laterality: N/A;   LEFT HEART CATHETERIZATION WITH CORONARY ANGIOGRAM N/A 09/27/2013   Procedure: LEFT HEART CATHETERIZATION WITH CORONARY ANGIOGRAM;  Surgeon: Blane Ohara, MD;  Location: [facility] CATH LAB;  Service: Cardiovascular;  Laterality: N/A;   THYROIDECTOMY  10/08/2011   Procedure: THYROIDECTOMY;  Surgeon: [name], MD;  Location: [facility] ORS;  Service: General;  Laterality: N/A;  Total   VAGINAL HYSTERECTOMY  1993    Social History   Socioeconomic History   Marital status: Widowed    Spouse name: Not on file   Number of children: Not on file   Years of education: Not on file   Highest education level: Not on file   Occupational History   Not on file  Tobacco Use   Smoking status: Never   Smokeless tobacco: Never  Vaping Use   Vaping Use: Never used  Substance and Sexual Activity   Alcohol use: No    Alcohol/week: 0.0 standard drinks   Drug use: No   Sexual activity: Never  Other Topics Concern   Not on file  Social History Narrative   ** Merged History Encounter **  Lives at home with family.  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      Social Determinants of Health   Financial Resource Strain: Low Risk    Difficulty of Paying Living Expenses: Not very hard  Food Insecurity: No Food Insecurity   Worried About Charity fundraiser in the Last Year: Never true   Ran Out of Food in the Last Year: Never true  Transportation Needs: No Transportation Needs   Lack of Transportation (Medical): No   Lack of Transportation (Non-Medical): No  Physical Activity: Sufficiently Active   Days of Exercise per Week: 5 days   Minutes of Exercise per Session: 30 min  Stress: No Stress Concern Present   Feeling of Stress : Not at all  Social Connections: Not on file  Intimate Partner Violence: Not on file    Current Outpatient Medications on File Prior to Visit  Medication Sig Dispense Refill  albuterol (ACCUNEB) 1.25 MG/3ML nebulizer solution      carvedilol (COREG) 25 MG tablet Take 1 tablet (25 mg total) by mouth 2 (two) times daily. 180 tablet 1   chlorthalidone (HYGROTON) 25 MG tablet Take 1 tablet (25 mg total) by mouth daily. 90 tablet 1   cloNIDine (CATAPRES - DOSED IN MG/24 HR) 0.2 mg/24hr patch Place 1 patch (0.2 mg total) onto the skin once a week. 4 patch 6   diclofenac Sodium (VOLTAREN) 1 % GEL Apply 2 g topically 4 (four) times daily as needed. 50 g 0   levothyroxine (SYNTHROID) 125 MCG tablet Take 125 mcg by mouth daily before breakfast. 1 HOUR PRIOR TO OTHER MEDICATIONS     nitroGLYCERIN (NITROSTAT) 0.4 MG SL tablet TAKE 1 TABLET UNDER THE TONGUE EVERY 5 MINS FOR CHEST PAIN - AFTER 2 DOSES GO TO  ED 25 tablet 3   spironolactone (ALDACTONE) 25 MG tablet Take 1 tablet (25 mg total) by mouth daily. 90 tablet 3   No current facility-administered medications on file prior to visit.    Allergies  Allergen Reactions   Fluviral [Influenza Virus Vaccine] Shortness Of Breath, Swelling and Other (See Comments)    Including back pain that radiates in upper extremeties   Haemophilus Influenzae Other (See Comments), Shortness Of Breath and Swelling    Including back pain that radiates in upper extremeties   Amlodipine     Lip swelling per patient.    Lisinopril     Dr. Legrand Rams stopped due to side effects per patient.  Could remember what side effects were.     Ranexa [Ranolazine]     Chest pain    Family History  Problem Relation Age of Onset   Colon cancer Father    Thyroid disease Neg Hx     BP 130/60 (BP Location: Right Arm, Patient Position: Sitting, Cuff Size: Normal)   Pulse 62   Ht 5\' 3"  (1.6 m)   Wt 173 lb 9.6 oz (78.7 kg)   SpO2 95%   BMI 30.75 kg/m    Review of Systems denies depression, muscle cramps, weight gain, memory loss, constipation, numbness, cold intolerance.  She has lost a few lbs--intentional.      Objective:   Physical Exam VS: see vs page GEN: no distress HEAD: head: no deformity eyes: no periorbital swelling, no proptosis external nose and ears are normal NECK: a healed scar is present. Right side of the thyroid is slightly enlarged, with MNG surface, but no particular nodule is palpable.   CHEST WALL: no deformity LUNGS: clear to auscultation CV: reg rate and rhythm, no murmur.  MUSCULOSKELETAL: gait is normal and steady EXTEMITIES: no deformity.  no leg edema NEURO:  readily moves all 4's.  sensation is intact to touch on all 4's SKIN:  Normal texture and temperature.  No rash or suspicious lesion is visible.   NODES:  None palpable at the neck PSYCH: alert, well-oriented.  Does not appear anxious nor depressed.  Pathol (2013): Thyroid,  thyroidectomy, left lobe. NODULAR HYPERPLASIA.  I have reviewed outside records, and summarized: Pt was noted to have hypothyroidism, and referred here.  2013 op note says total thyroiect, but path report says left lobect.  Lab Results  Component Value Date   TSH 2.36 11/21/2020   T4TOTAL 5.4 09/04/2019   T4TOTAL 5.9 09/04/2019      Assessment & Plan:  Postsurgical hypothyroidism: well-controlled.  Please continue the same synthroid MNG, due for recheck  Patient Instructions  Blood tests are requested for you today.  We'll let you know about the results.  Let's check the ultrasound.  you will receive a phone call, about a day and time for an appointment.  Please come back for a follow-up appointment in 6 months.

## 2020-11-21 NOTE — Patient Instructions (Signed)
Blood tests are requested for you today.  We'll let you know about the results.  Let's check the ultrasound.  you will receive a phone call, about a day and time for an appointment.  Please come back for a follow-up appointment in 6 months.

## 2020-11-27 ENCOUNTER — Telehealth: Payer: Self-pay | Admitting: Endocrinology

## 2020-11-27 DIAGNOSIS — Z1331 Encounter for screening for depression: Secondary | ICD-10-CM | POA: Diagnosis not present

## 2020-11-27 DIAGNOSIS — I10 Essential (primary) hypertension: Secondary | ICD-10-CM | POA: Diagnosis not present

## 2020-11-27 DIAGNOSIS — I2510 Atherosclerotic heart disease of native coronary artery without angina pectoris: Secondary | ICD-10-CM | POA: Diagnosis not present

## 2020-11-27 DIAGNOSIS — E039 Hypothyroidism, unspecified: Secondary | ICD-10-CM | POA: Diagnosis not present

## 2020-11-27 DIAGNOSIS — Z0001 Encounter for general adult medical examination with abnormal findings: Secondary | ICD-10-CM | POA: Diagnosis not present

## 2020-11-27 DIAGNOSIS — E782 Mixed hyperlipidemia: Secondary | ICD-10-CM | POA: Diagnosis not present

## 2020-11-27 DIAGNOSIS — Z1389 Encounter for screening for other disorder: Secondary | ICD-10-CM | POA: Diagnosis not present

## 2020-11-27 NOTE — Progress Notes (Deleted)
Chief Complaint: ***  HPI :    Past Medical History:  Diagnosis Date   Anxiety    Dysphagia 09/03/2020   Essential hypertension    Hypothyroidism    NSTEMI (non-ST elevated myocardial infarction) ([place]) 2015   a.  Related to occlusion of the distal Cx. The mid to distal OM1 also contains a significant stenosis that is best treated with medical therapy. Patent RCA/LAD with tortuosity. LM patent. EF 55%.    Sleep apnea      Past Surgical History:  Procedure Laterality Date   ABDOMINAL HYSTERECTOMY     CARDIAC CATHETERIZATION  ~ 2000   CORONARY ANGIOPLASTY  09/20/2013   LEFT HEART CATHETERIZATION WITH CORONARY ANGIOGRAM N/A 09/20/2013   Procedure: LEFT HEART CATHETERIZATION WITH CORONARY ANGIOGRAM;  Surgeon: Sinclair Grooms, MD;  Location[facility] CATH LAB;  Service: Cardiovascular;  Laterality: N/A;   LEFT HEART CATHETERIZATION WITH CORONARY ANGIOGRAM N/A 09/27/2013   Procedure: LEFT HEART CATHETERIZATION WITH CORONARY ANGIOGRAM;  Surgeon: Blane Ohara, MD;  Location: [facility] CATH LAB;  Service: Cardiovascular;  Laterality: N/A;   THYROIDECTOMY  10/08/2011   Procedure: THYROIDECTOMY;  Surgeon: [name], MD;  Location: [facility] ORS;  Service: General;  Laterality: N/A;  Total   VAGINAL HYSTERECTOMY  1993   Family History  Problem Relation Age of Onset   Colon cancer Father    Thyroid disease Neg Hx    Social History   Tobacco Use   Smoking status: Never   Smokeless tobacco: Never  Vaping Use   Vaping Use: Never used  Substance Use Topics   Alcohol use: No    Alcohol/week: 0.0 standard drinks   Drug use: No   Current Outpatient Medications  Medication Sig Dispense Refill   albuterol (ACCUNEB) 1.25 MG/3ML nebulizer solution      carvedilol (COREG) 25 MG tablet Take 1 tablet (25 mg total) by mouth 2 (two) times daily. 180 tablet 1   chlorthalidone (HYGROTON) 25 MG tablet Take 1 tablet (25 mg total) by mouth daily. 90 tablet 1   cloNIDine (CATAPRES - DOSED IN MG/24 HR) 0.2  mg/24hr patch Place 1 patch (0.2 mg total) onto the skin once a week. 4 patch 6   diclofenac Sodium (VOLTAREN) 1 % GEL Apply 2 g topically 4 (four) times daily as needed. 50 g 0   levothyroxine (SYNTHROID) 125 MCG tablet Take 125 mcg by mouth daily before breakfast. 1 HOUR PRIOR TO OTHER MEDICATIONS     nitroGLYCERIN (NITROSTAT) 0.4 MG SL tablet TAKE 1 TABLET UNDER THE TONGUE EVERY 5 MINS FOR CHEST PAIN - AFTER 2 DOSES GO TO ED 25 tablet 3   spironolactone (ALDACTONE) 25 MG tablet Take 1 tablet (25 mg total) by mouth daily. 90 tablet 3   No current facility-administered medications for this visit.   Allergies  Allergen Reactions   Fluviral [Influenza Virus Vaccine] Shortness Of Breath, Swelling and Other (See Comments)    Including back pain that radiates in upper extremeties   Haemophilus Influenzae Other (See Comments), Shortness Of Breath and Swelling    Including back pain that radiates in upper extremeties   Amlodipine     Lip swelling per patient.    Lisinopril     Dr. Legrand Rams stopped due to side effects per patient.  Could remember what side effects were.     Ranexa [Ranolazine]     Chest pain     Review of Systems: All systems reviewed and negative except where noted in  HPI.   Physical Exam: There were no vitals taken for this visit. Constitutional: Pleasant,well-developed, ***female in no acute distress. HEENT: Normocephalic and atraumatic. Conjunctivae are normal. No scleral icterus. Cardiovascular: Normal rate, regular rhythm.  Pulmonary/chest: Effort normal and breath sounds normal. No wheezing, rales or rhonchi. Abdominal: Soft, nondistended, nontender. Bowel sounds active throughout. There are no masses palpable. No hepatomegaly. Extremities: No edema Neurological: Alert and oriented to person place and time. Skin: Skin is warm and dry. No rashes noted. Psychiatric: Normal mood and affect. Behavior is normal.  No results found.   ASSESSMENT AND PLAN:  [name] Reading, MD

## 2020-11-27 NOTE — Telephone Encounter (Signed)
Labs has been placed up front to be mailed to [employer] per pt's request.

## 2020-11-27 NOTE — Telephone Encounter (Signed)
Patient called and requested that her lab results be mailed to :  [facility]  [place] [phone]

## 2020-11-28 ENCOUNTER — Ambulatory Visit: Payer: Medicare HMO | Admitting: Internal Medicine

## 2020-11-28 ENCOUNTER — Telehealth: Payer: Self-pay

## 2020-11-28 ENCOUNTER — Telehealth: Payer: Self-pay | Admitting: *Deleted

## 2020-11-28 LAB — LIPID PANEL
Chol/HDL Ratio: 3.1 ratio (ref 0.0–4.4)
Cholesterol, Total: 155 mg/dL (ref 100–199)
HDL: 50 mg/dL (ref >39)
LDL Chol Calc (NIH): 92 mg/dL (ref 0–99)
Triglycerides: 65 mg/dL (ref 0–149)
VLDL Cholesterol Cal: 13 mg/dL (ref 5–40)

## 2020-11-28 NOTE — Telephone Encounter (Signed)
-----   Message from Satira Sark, MD sent at 11/28/2020  8:13 AM EDT ----- Results reviewed.  LDL was 92. Continue with diet management as per recent office note.

## 2020-11-28 NOTE — Telephone Encounter (Signed)
Patient informed. 

## 2020-11-28 NOTE — Telephone Encounter (Signed)
No show letter mailed to patient. 

## 2020-12-02 ENCOUNTER — Encounter: Payer: Self-pay | Admitting: Cardiovascular Disease

## 2020-12-11 DIAGNOSIS — I10 Essential (primary) hypertension: Secondary | ICD-10-CM | POA: Diagnosis not present

## 2020-12-11 DIAGNOSIS — Z0001 Encounter for general adult medical examination with abnormal findings: Secondary | ICD-10-CM | POA: Diagnosis not present

## 2020-12-11 DIAGNOSIS — E039 Hypothyroidism, unspecified: Secondary | ICD-10-CM | POA: Diagnosis not present

## 2020-12-12 ENCOUNTER — Other Ambulatory Visit (HOSPITAL_COMMUNITY): Payer: Self-pay | Admitting: Gerontology

## 2020-12-12 ENCOUNTER — Other Ambulatory Visit: Payer: Self-pay | Admitting: Gerontology

## 2020-12-12 DIAGNOSIS — N183 Chronic kidney disease, stage 3 unspecified: Secondary | ICD-10-CM

## 2020-12-17 ENCOUNTER — Telehealth: Payer: Self-pay | Admitting: Urology

## 2020-12-17 NOTE — Telephone Encounter (Signed)
[name] [name] at [facility] contacted the office to inform that patient declined an appt for pelvic floor therapy at [facility].

## 2020-12-25 ENCOUNTER — Other Ambulatory Visit: Payer: Self-pay

## 2020-12-25 ENCOUNTER — Ambulatory Visit (HOSPITAL_COMMUNITY)
Admission: RE | Admit: 2020-12-25 | Discharge: 2020-12-25 | Disposition: A | Discharge status: Home / Self Care | Payer: Medicare HMO | Source: Ambulatory Visit | Attending: Gerontology | Admitting: Gerontology

## 2020-12-25 DIAGNOSIS — N183 Chronic kidney disease, stage 3 unspecified: Secondary | ICD-10-CM | POA: Insufficient documentation

## 2020-12-25 DIAGNOSIS — N200 Calculus of kidney: Secondary | ICD-10-CM | POA: Diagnosis not present

## 2020-12-28 DIAGNOSIS — I10 Essential (primary) hypertension: Secondary | ICD-10-CM | POA: Diagnosis not present

## 2020-12-28 DIAGNOSIS — I2510 Atherosclerotic heart disease of native coronary artery without angina pectoris: Secondary | ICD-10-CM | POA: Diagnosis not present

## 2021-01-27 DIAGNOSIS — N183 Chronic kidney disease, stage 3 unspecified: Secondary | ICD-10-CM | POA: Diagnosis not present

## 2021-01-27 DIAGNOSIS — I10 Essential (primary) hypertension: Secondary | ICD-10-CM | POA: Diagnosis not present

## 2021-02-07 ENCOUNTER — Emergency Department (HOSPITAL_COMMUNITY): Payer: Medicare HMO

## 2021-02-07 ENCOUNTER — Emergency Department (HOSPITAL_COMMUNITY)
Admission: EM | Admit: 2021-02-07 | Discharge: 2021-02-07 | Disposition: A | Discharge status: Home / Self Care | Payer: Medicare HMO | Attending: Emergency Medicine | Admitting: Emergency Medicine

## 2021-02-07 ENCOUNTER — Encounter (HOSPITAL_COMMUNITY): Payer: Self-pay

## 2021-02-07 ENCOUNTER — Other Ambulatory Visit: Payer: Self-pay

## 2021-02-07 DIAGNOSIS — R079 Chest pain, unspecified: Secondary | ICD-10-CM | POA: Diagnosis not present

## 2021-02-07 DIAGNOSIS — Z955 Presence of coronary angioplasty implant and graft: Secondary | ICD-10-CM | POA: Insufficient documentation

## 2021-02-07 DIAGNOSIS — E279 Disorder of adrenal gland, unspecified: Secondary | ICD-10-CM | POA: Diagnosis not present

## 2021-02-07 DIAGNOSIS — R109 Unspecified abdominal pain: Secondary | ICD-10-CM | POA: Diagnosis not present

## 2021-02-07 DIAGNOSIS — I10 Essential (primary) hypertension: Secondary | ICD-10-CM | POA: Diagnosis not present

## 2021-02-07 DIAGNOSIS — R0789 Other chest pain: Secondary | ICD-10-CM | POA: Diagnosis not present

## 2021-02-07 DIAGNOSIS — R61 Generalized hyperhidrosis: Secondary | ICD-10-CM | POA: Diagnosis not present

## 2021-02-07 DIAGNOSIS — E039 Hypothyroidism, unspecified: Secondary | ICD-10-CM | POA: Diagnosis not present

## 2021-02-07 DIAGNOSIS — I700 Atherosclerosis of aorta: Secondary | ICD-10-CM | POA: Diagnosis not present

## 2021-02-07 DIAGNOSIS — Z9049 Acquired absence of other specified parts of digestive tract: Secondary | ICD-10-CM | POA: Diagnosis not present

## 2021-02-07 DIAGNOSIS — I2510 Atherosclerotic heart disease of native coronary artery without angina pectoris: Secondary | ICD-10-CM | POA: Insufficient documentation

## 2021-02-07 DIAGNOSIS — J9811 Atelectasis: Secondary | ICD-10-CM | POA: Diagnosis not present

## 2021-02-07 DIAGNOSIS — Z79899 Other long term (current) drug therapy: Secondary | ICD-10-CM | POA: Insufficient documentation

## 2021-02-07 DIAGNOSIS — Z7951 Long term (current) use of inhaled steroids: Secondary | ICD-10-CM | POA: Insufficient documentation

## 2021-02-07 DIAGNOSIS — Z20822 Contact with and (suspected) exposure to covid-19: Secondary | ICD-10-CM | POA: Insufficient documentation

## 2021-02-07 DIAGNOSIS — M549 Dorsalgia, unspecified: Secondary | ICD-10-CM | POA: Diagnosis not present

## 2021-02-07 LAB — CBC
HCT: 53.3 % — ABNORMAL HIGH (ref 36.0–46.0)
Hemoglobin: 17.2 g/dL — ABNORMAL HIGH (ref 12.0–15.0)
MCH: 29.5 pg (ref 26.0–34.0)
MCHC: 32.3 g/dL (ref 30.0–36.0)
MCV: 91.4 fL (ref 80.0–100.0)
Platelets: 595 10*3/uL — ABNORMAL HIGH (ref 150–400)
RBC: 5.83 MIL/uL — ABNORMAL HIGH (ref 3.87–5.11)
RDW: 18.2 % — ABNORMAL HIGH (ref 11.5–15.5)
WBC: 5.7 10*3/uL (ref 4.0–10.5)
nRBC: 0 % (ref 0.0–0.2)

## 2021-02-07 LAB — TROPONIN I (HIGH SENSITIVITY)
Troponin I (High Sensitivity): 8 ng/L (ref <18)
Troponin I (High Sensitivity): 7 ng/L (ref <18)
Troponin I (High Sensitivity): 7 ng/L (ref <18)

## 2021-02-07 LAB — BASIC METABOLIC PANEL
Anion gap: 9 (ref 5–15)
BUN: 27 mg/dL — ABNORMAL HIGH (ref 8–23)
CO2: 24 mmol/L (ref 22–32)
Calcium: 9.2 mg/dL (ref 8.9–10.3)
Chloride: 104 mmol/L (ref 98–111)
Creatinine, Ser: 1.34 mg/dL — ABNORMAL HIGH (ref 0.44–1.00)
GFR, Estimated: 42 mL/min — ABNORMAL LOW (ref >60)
Glucose, Bld: 97 mg/dL (ref 70–99)
Potassium: 3.9 mmol/L (ref 3.5–5.1)
Sodium: 137 mmol/L (ref 135–145)

## 2021-02-07 LAB — RESP PANEL BY RT-PCR (FLU A&B, COVID) ARPGX2
Influenza A by PCR: NEGATIVE
Influenza B by PCR: NEGATIVE
SARS Coronavirus 2 by RT PCR: NEGATIVE

## 2021-02-07 LAB — D-DIMER, QUANTITATIVE: D-Dimer, Quant: 0.79 ug/mL-FEU — ABNORMAL HIGH (ref 0.00–0.50)

## 2021-02-07 MED ORDER — ALUM & MAG HYDROXIDE-SIMETH 200-200-20 MG/5ML PO SUSP
30.0000 mL | Freq: Once | ORAL | Status: AC
  Administered 2021-02-07: 30 mL via ORAL
  Filled 2021-02-07: qty 30

## 2021-02-07 MED ORDER — LABETALOL HCL 5 MG/ML IV SOLN: 10.0000 mg | INTRAVENOUS | Status: DC | PRN

## 2021-02-07 MED ORDER — ASPIRIN EC 81 MG PO TBEC: 81.0000 mg | DELAYED_RELEASE_TABLET | Freq: Every day | ORAL | 2 refills | Status: DC

## 2021-02-07 MED ORDER — NITROGLYCERIN 0.4 MG SL SUBL: 0.4000 mg | SUBLINGUAL_TABLET | Freq: Once | SUBLINGUAL | Status: DC

## 2021-02-07 MED ORDER — LEVOTHYROXINE SODIUM 125 MCG PO TABS: 125.0000 ug | ORAL_TABLET | Freq: Every day | ORAL | 2 refills | Status: DC

## 2021-02-07 MED ORDER — IOHEXOL 350 MG/ML SOLN
80.0000 mL | Freq: Once | INTRAVENOUS | Status: AC | PRN
  Administered 2021-02-07: 80 mL via INTRAVENOUS

## 2021-02-07 MED ORDER — ASPIRIN 81 MG PO CHEW
324.0000 mg | CHEWABLE_TABLET | Freq: Once | ORAL | Status: AC
  Administered 2021-02-07: 324 mg via ORAL
  Filled 2021-02-07: qty 4

## 2021-02-07 MED ORDER — CLONIDINE 0.2 MG/24HR TD PTWK: 0.2000 mg | MEDICATED_PATCH | TRANSDERMAL | 2 refills | Status: DC

## 2021-02-07 MED ORDER — MORPHINE SULFATE (PF) 2 MG/ML IV SOLN
2.0000 mg | Freq: Once | INTRAVENOUS | Status: AC
  Administered 2021-02-07: 2 mg via INTRAVENOUS
  Filled 2021-02-07: qty 1

## 2021-02-07 MED ORDER — ATORVASTATIN CALCIUM 10 MG PO TABS: 10.0000 mg | ORAL_TABLET | Freq: Every day | ORAL | 2 refills | Status: DC

## 2021-02-07 MED ORDER — SPIRONOLACTONE 25 MG PO TABS: 25.0000 mg | ORAL_TABLET | Freq: Every day | ORAL | 3 refills | Status: DC

## 2021-02-07 MED ORDER — CARVEDILOL 25 MG PO TABS: 25.0000 mg | ORAL_TABLET | Freq: Two times a day (BID) | ORAL | 1 refills | Status: DC

## 2021-02-07 MED ORDER — CHLORTHALIDONE 25 MG PO TABS: 12.5000 mg | ORAL_TABLET | Freq: Every day | ORAL | 1 refills | Status: DC

## 2021-02-07 MED ORDER — LIDOCAINE VISCOUS HCL 2 % MT SOLN
15.0000 mL | Freq: Once | OROMUCOSAL | Status: AC
  Administered 2021-02-07: 15 mL via ORAL
  Filled 2021-02-07: qty 15

## 2021-02-07 NOTE — ED Notes (Signed)
Patient to CT at this time

## 2021-02-07 NOTE — Discharge Instructions (Signed)
1)Avoid ibuprofen/Advil/Aleve/Motrin/Goody Powders/Naproxen/BC powders/Meloxicam/Diclofenac/Indomethacin and other Nonsteroidal anti-inflammatory medications as these will make you more likely to bleed and can cause stomach ulcers, can also cause Kidney problems.   2)follow up with Rosita Fire, MD (Primary Care Physician) within a week for recheck---you may need a Heart Stress Test  3)Please Note changes to your medications

## 2021-02-07 NOTE — ED Triage Notes (Signed)
Pt arrived from home via POV w c/o chest pain, radiating from left side of neck down to chest. 10/10. Pt states it starts approx 230 this am

## 2021-02-07 NOTE — ED Provider Notes (Signed)
[facility] EMERGENCY DEPARTMENT Provider Note   CSN: [id number] Arrival date & time: 02/07/21  4315     History Chief Complaint  Patient presents with   Chest Pain    [name] [name] is a 75 y.o. female.  Patient with a history of previous MI, hypertension, CKD presenting with left-sided chest pain, burning and tightness that onset about 145 this morning.  States pain started while she was cleaning her cabinets and has been constant since.  Pain radiates to her left neck and upper arm.  Feels like a tightness and a burning.  Nothing makes it better nothing makes it worse.  No shortness of breath, nausea or vomiting.  Did have some diaphoresis at home.  Last time she had this pain was in 2006 when she was having an MI. She thinks she had a stress test last year that was reassuring. Not take any medications for it at home.  There is no radiation of the pain to her back.  No abdominal pain.  No vomiting. States she had an MI in the past but does not have any stents  The history is provided by the patient.  Chest Pain Associated symptoms: diaphoresis   Associated symptoms: no abdominal pain, no cough, no fatigue, no fever, no headache, no nausea, no palpitations, no shortness of breath and no vomiting       Past Medical History:  Diagnosis Date   Anxiety    Dysphagia 09/03/2020   Essential hypertension    Hypothyroidism    NSTEMI (non-ST elevated myocardial infarction) ([place]) 2015   a.  Related to occlusion of the distal Cx. The mid to distal OM1 also contains a significant stenosis that is best treated with medical therapy. Patent RCA/LAD with tortuosity. LM patent. EF 55%.    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   Dysphagia 09/03/2020   Statin intolerance 11/16/2019   Statin myopathy 11/16/2019   Neck pain 09/14/2016   Paresthesia 09/14/2016   Chest pain 09/26/2013   Hypothyroidism    Sleep apnea     Anxiety    NSTEMI (non-ST elevated myocardial infarction) ([place]) 09/20/2013   HLD (hyperlipidemia) 01/17/2008   Essential hypertension 01/17/2008   CAD in native artery 01/17/2008    Past Surgical History:  Procedure Laterality Date   ABDOMINAL HYSTERECTOMY     CARDIAC CATHETERIZATION  ~ 2000   CORONARY ANGIOPLASTY  09/20/2013   LEFT HEART CATHETERIZATION WITH CORONARY ANGIOGRAM N/A 09/20/2013   Procedure: LEFT HEART CATHETERIZATION WITH CORONARY ANGIOGRAM;  Surgeon: Sinclair Grooms, MD;  Location: [facility] CATH LAB;  Service: Cardiovascular;  Laterality: N/A;   LEFT HEART CATHETERIZATION WITH CORONARY ANGIOGRAM N/A 09/27/2013   Procedure: LEFT HEART CATHETERIZATION WITH CORONARY ANGIOGRAM;  Surgeon: Blane Ohara, MD;  Location: [facility] CATH LAB;  Service: Cardiovascular;  Laterality: N/A;   THYROIDECTOMY  10/08/2011   Procedure: THYROIDECTOMY;  Surgeon: [name], MD;  Location: [facility] ORS;  Service: General;  Laterality: N/A;  Total   VAGINAL HYSTERECTOMY  1993     OB History     Gravida  2   Para  2   Term  2   Preterm  0   AB  0   Living  2      SAB  0   IAB  0   Ectopic  0   Multiple      Live Births  Family History  Problem Relation Age of Onset   Colon cancer Father    Thyroid disease Neg Hx     Social History   Tobacco Use   Smoking status: Never   Smokeless tobacco: Never  Vaping Use   Vaping Use: Never used  Substance Use Topics   Alcohol use: No    Alcohol/week: 0.0 standard drinks   Drug use: No    Home Medications Prior to Admission medications   Medication Sig Start Date End Date Taking? Authorizing Provider  albuterol (ACCUNEB) 1.25 MG/3ML nebulizer solution  05/10/20   [provider]  carvedilol (COREG) 25 MG tablet Take 1 tablet (25 mg total) by mouth 2 (two) times daily. 09/03/20 12/02/20  Skeet Latch, MD  chlorthalidone (HYGROTON) 25 MG tablet Take 1 tablet (25 mg total) by mouth daily. 09/03/20   Skeet Latch, MD  cloNIDine (CATAPRES - DOSED IN MG/24 HR) 0.2 mg/24hr patch Place 1 patch (0.2 mg total) onto the skin once a week. 11/18/20   Satira Sark, MD  diclofenac Sodium (VOLTAREN) 1 % GEL Apply 2 g topically 4 (four) times daily as needed. 01/23/20   Long, Wonda Olds, MD  levothyroxine (SYNTHROID) 125 MCG tablet Take 125 mcg by mouth daily before breakfast. 1 HOUR PRIOR TO OTHER MEDICATIONS    [provider]  nitroGLYCERIN (NITROSTAT) 0.4 MG SL tablet TAKE 1 TABLET UNDER THE TONGUE EVERY 5 MINS FOR CHEST PAIN - AFTER 2 DOSES GO TO ED 11/12/19   Verta Ellen., NP  spironolactone (ALDACTONE) 25 MG tablet Take 1 tablet (25 mg total) by mouth daily. 09/03/20 12/02/20  Skeet Latch, MD    Allergies    Fluviral [influenza virus vaccine], Haemophilus influenzae, Amlodipine, Lisinopril, and Ranexa [ranolazine]  Review of Systems   Review of Systems  Constitutional:  Positive for diaphoresis. Negative for activity change, appetite change, fatigue and fever.  HENT:  Negative for congestion and rhinorrhea.   Eyes:  Negative for visual disturbance.  Respiratory:  Positive for chest tightness. Negative for cough and shortness of breath.   Cardiovascular:  Positive for chest pain. Negative for palpitations.  Gastrointestinal:  Negative for abdominal pain, nausea and vomiting.  Genitourinary:  Negative for dysuria, flank pain and hematuria.  Musculoskeletal:  Negative for arthralgias and myalgias.  Skin:  Negative for rash.  Neurological:  Negative for headaches.   all other systems are negative except as noted in the HPI and PMH.   Physical Exam Updated Vital Signs BP (!) 167/102 (BP Location: Left Arm)    Pulse (!) 57    Temp 98.2 F (36.8 C) (Oral)    Resp 18    Ht 5\' 3"  (1.6 m)    Wt 77.1 kg    SpO2 98%    BMI 30.11 kg/m   Physical Exam Vitals and nursing note reviewed.  Constitutional:      General: She is not in acute distress.    Appearance: She is  well-developed. She is not ill-appearing.  HENT:     Head: Normocephalic and atraumatic.     Mouth/Throat:     Pharynx: No oropharyngeal exudate.  Eyes:     Conjunctiva/sclera: Conjunctivae normal.     Pupils: Pupils are equal, round, and reactive to light.  Neck:     Comments: No meningismus. Cardiovascular:     Rate and Rhythm: Normal rate and regular rhythm.     Heart sounds: Normal heart sounds. No murmur heard. Pulmonary:  Effort: Pulmonary effort is normal. No respiratory distress.     Breath sounds: Normal breath sounds.  Chest:     Chest wall: No tenderness.  Abdominal:     Palpations: Abdomen is soft.     Tenderness: There is no abdominal tenderness. There is no guarding or rebound.  Musculoskeletal:        General: No tenderness. Normal range of motion.     Cervical back: Normal range of motion and neck supple.  Skin:    General: Skin is warm.  Neurological:     Mental Status: She is alert and oriented to person, place, and time.     Cranial Nerves: No cranial nerve deficit.     Motor: No abnormal muscle tone.     Coordination: Coordination normal.     Comments:  5/5 strength throughout. CN 2-12 intact.Equal grip strength.   Psychiatric:        Behavior: Behavior normal.    ED Results / Procedures / Treatments   Labs (all labs ordered are listed, but only abnormal results are displayed) Labs Reviewed  CBC - Abnormal; Notable for the following components:      Result Value   RBC 5.83 (*)    Hemoglobin 17.2 (*)    HCT 53.3 (*)    RDW 18.2 (*)    Platelets 595 (*)    All other components within normal limits  BASIC METABOLIC PANEL - Abnormal; Notable for the following components:   BUN 27 (*)    Creatinine, Ser 1.34 (*)    GFR, Estimated 42 (*)    All other components within normal limits  RESP PANEL BY RT-PCR (FLU A&B, COVID) ARPGX2  D-DIMER, QUANTITATIVE  TROPONIN I (HIGH SENSITIVITY)  TROPONIN I (HIGH SENSITIVITY)    EKG EKG  Interpretation  Date/Time:  Saturday February 07 2021 03:35:40 EST Ventricular Rate:  56 PR Interval:  188 QRS Duration: 88 QT Interval:  443 QTC Calculation: 428 R Axis:   14 Text Interpretation: Sinus rhythm Anterior infarct, old No significant change was found Confirmed by Ezequiel Essex [phone]) on 02/07/2021 3:41:49 AM  Radiology DG Chest Port 1 View  Result Date: 02/07/2021 CLINICAL DATA:  Chest pain. EXAM: PORTABLE CHEST 1 VIEW COMPARISON:  May 09, 2020 FINDINGS: Mild, diffuse, chronic appearing increased interstitial lung markings are noted. A trace amount of atelectasis is seen within the left lung base. There is no evidence of acute infiltrate, pleural effusion or pneumothorax. The heart size and mediastinal contours are within normal limits. The visualized skeletal structures are unremarkable. IMPRESSION: Chronic appearing increased interstitial lung markings without evidence of acute or active cardiopulmonary disease. Electronically Signed   By: Virgina Norfolk M.D.   On: 02/07/2021 04:35    Procedures Procedures   Medications Ordered in ED Medications  aspirin chewable tablet 324 mg (has no administration in time range)  alum & mag hydroxide-simeth (MAALOX/MYLANTA) 200-200-20 MG/5ML suspension 30 mL (has no administration in time range)    And  lidocaine (XYLOCAINE) 2 % viscous mouth solution 15 mL (has no administration in time range)    ED Course  I have reviewed the triage vital signs and the nursing notes.  Pertinent labs & imaging results that were available during my care of the patient were reviewed by me and considered in my medical decision making (see chart for details).    MDM Rules/Calculators/A&P  chest pain that onset while she was cleaning her cabinets.  Similar previous MI.  Anterior Q waves with ST elevation.  Patient given aspirin.  She declines nitroglycerin as it causes her to have a headache. It appears her last  stress test was 2017.  She does not have any stents.  EKG without acute ST elevation.  Chest x-ray with chronic changes.  Troponin is negative x1.  Heart score is 5.  Will likely need admission for rule out.  Patient now saying the pain radiates to her "kidney area" and flank.  Given her ongoing chest pain as well as left flank pain.  Will obtain CT scan to rule out aortic dissection.  Care to be transferred at shift change to Dr. Roderic Palau. Will plan admission for chest pain rule out after CT.     Final Clinical Impression(s) / ED Diagnoses Final diagnoses:  Chest pain    Rx / DC Orders ED Discharge Orders     None        Demarion Pondexter, Annie Main, MD 02/07/21 [phone]

## 2021-02-07 NOTE — ED Notes (Signed)
Hospitalist at bedside speaking with patient at this time.

## 2021-02-07 NOTE — Consult Note (Signed)
[name] [name], is a 75 y.o. female  DOB [birth year]  MRN [id number].  Admission date:  02/07/2021  Admitting Physician  No admitting provider for patient encounter.  Discharge Date:  02/07/2021   Primary MD  Rosita Fire, MD  Recommendations for primary care physician for things to follow:  1)Avoid ibuprofen/Advil/Aleve/Motrin/Goody Powders/Naproxen/BC powders/Meloxicam/Diclofenac/Indomethacin and other Nonsteroidal anti-inflammatory medications as these will make you more likely to bleed and can cause stomach ulcers, can also cause Kidney problems.   2)follow up with Rosita Fire, MD (Primary Care Physician) within a week for recheck---you may need a Heart Stress Test  3)Please Note changes to your medications   Admission Diagnosis  chest pain   Discharge Diagnosis  chest pain    Active Problems:   * No active hospital problems. *      Past Medical History:  Diagnosis Date   Anxiety    Dysphagia 09/03/2020   Essential hypertension    Hypothyroidism    NSTEMI (non-ST elevated myocardial infarction) ([place]) 2015   a.  Related to occlusion of the distal Cx. The mid to distal OM1 also contains a significant stenosis that is best treated with medical therapy. Patent RCA/LAD with tortuosity. LM patent. EF 55%.    Sleep apnea     Past Surgical History:  Procedure Laterality Date   ABDOMINAL HYSTERECTOMY     CARDIAC CATHETERIZATION  ~ 2000   CORONARY ANGIOPLASTY  09/20/2013   LEFT HEART CATHETERIZATION WITH CORONARY ANGIOGRAM N/A 09/20/2013   Procedure: LEFT HEART CATHETERIZATION WITH CORONARY ANGIOGRAM;  Surgeon: Sinclair Grooms, MD;  Location: [facility] CATH LAB;  Service: Cardiovascular;  Laterality: N/A;   LEFT HEART CATHETERIZATION WITH CORONARY ANGIOGRAM N/A 09/27/2013   Procedure: LEFT HEART CATHETERIZATION WITH CORONARY ANGIOGRAM;  Surgeon: Blane Ohara, MD;  Location: [facility] CATH LAB;   Service: Cardiovascular;  Laterality: N/A;   THYROIDECTOMY  10/08/2011   Procedure: THYROIDECTOMY;  Surgeon: [name], MD;  Location: [facility] ORS;  Service: General;  Laterality: N/A;  Total   VAGINAL HYSTERECTOMY  1993      HPI  from the history and physical done on the day of admission:    75 y.o. female with a hx of CAD, NSTEMI, hypertension, hyperlipidemia, diabetes, and OSA , She previously had an NSTEMI 09/2013.  She has a distal occluded OM1 that was medically managed.  Echo at that time revealed LVEF 55 to 60%.  She had a stress test 09/2015 that was low risk.  -She has had episodes of chest discomfort in the past which was deemed to be secondary to costochondritis and she was prescribed Voltaren gel -- For most of the evening/night yesterday she was cleaning out cabinets reaching up and down into the high and low cabinets--around 2 AM she started to notice left-sided chest wall discomfort with movement of the arm the chest discomfort radiated into her left shoulder and neck area -No pressure, no dizziness no palpitations no shortness of breath HI discomfort with somewhat--- she did not have any  dyspnea on exertion -in the ED serial EKG today shows sinus rhythm without acute findings -CTA chest without acute PE or aortic aneurysm or dissection -CT abdomen pelvis without acute findings Troponin >>8>>7>>7 D-Dimer 0.79 --No fever  Or chills  No Nausea, Vomiting or Diarrhea -COVID and influenza negative WBC 5.7, Creatinine 1.34      Hospital Course:        1) atypical chest pain----in the ED serial EKG today shows sinus rhythm without acute findings -CTA chest without acute PE or aortic aneurysm or dissection -CT abdomen pelvis without acute findings Troponin >>8>>7>>7 D-Dimer 0.79 -Patient onset of chest pain over 7 hours now -Physical exam reveals consistently reproducible left costochondral tenderness -Patient is ruled out for ACS by above labs and EKG -Patient with  history of CAD with prior NSTEMI in 2015 --discharged home on aspirin,coreg and  Lipitor -LDL on 11/27/2020 was 92, given history of CAD/prior history of NSTEMI LDL goal should be less than 70 -Patient apparently did not tolerate Ranexa in the past -PTA patient was not on aspirin and Lipitor this was added during this ED visit  2)HTN--decrease chlorthalidone to 12.5 mg, continue Coreg and spironolactone  Discharge Condition: Stable,  Follow UP   Follow-up Information     Rosita Fire, MD Follow up in 1 week(s).   Specialty: Internal Medicine Why: may need stress test Contact information: [place] [address] [phone]         Satira Sark, MD .   Specialty: Cardiology Contact information: [place] [address] [phone]                 Diet and Activity recommendation:  As advised  Discharge Instructions    Discharge Instructions     Call MD for:  difficulty breathing, headache or visual disturbances   Complete by: As directed    Call MD for:  persistant dizziness or light-headedness   Complete by: As directed    Call MD for:  persistant nausea and vomiting   Complete by: As directed    Call MD for:  severe uncontrolled pain   Complete by: As directed    Call MD for:  temperature >100.4   Complete by: As directed    Diet - low sodium heart healthy   Complete by: As directed    Discharge instructions   Complete by: As directed    1)Avoid ibuprofen/Advil/Aleve/Motrin/Goody Powders/Naproxen/BC powders/Meloxicam/Diclofenac/Indomethacin and other Nonsteroidal anti-inflammatory medications as these will make you more likely to bleed and can cause stomach ulcers, can also cause Kidney problems.   2)follow up with Rosita Fire, MD (Primary Care Physician) within a week for recheck---you may need a Heart Stress Test  3)Please Note changes to your medications   Increase activity slowly   Complete by: As  directed          Discharge Medications     Allergies as of 02/07/2021       Reactions   Fluviral [influenza Virus Vaccine] Shortness Of Breath, Swelling, Other (See Comments)   Including back pain that radiates in upper extremeties   Haemophilus Influenzae Other (See Comments), Shortness Of Breath, Swelling   Including back pain that radiates in upper extremeties   Amlodipine    Lip swelling per patient.    Lisinopril    Dr. Legrand Rams stopped due to side effects per patient.  Could remember what side effects were.     Ranexa [ranolazine]  Chest pain        Medication List     STOP taking these medications    albuterol 1.25 MG/3ML nebulizer solution Commonly known as: ACCUNEB   nitroGLYCERIN 0.4 MG SL tablet Commonly known as: NITROSTAT       TAKE these medications    aspirin EC 81 MG tablet Take 1 tablet (81 mg total) by mouth daily with breakfast.   atorvastatin 10 MG tablet Commonly known as: Lipitor Take 1 tablet (10 mg total) by mouth daily.   carvedilol 25 MG tablet Commonly known as: COREG Take 1 tablet (25 mg total) by mouth 2 (two) times daily.   chlorthalidone 25 MG tablet Commonly known as: HYGROTON Take 0.5 tablets (12.5 mg total) by mouth daily. What changed: how much to take   cloNIDine 0.2 mg/24hr patch Commonly known as: CATAPRES - Dosed in mg/24 hr Place 1 patch (0.2 mg total) onto the skin once a week.   diclofenac Sodium 1 % Gel Commonly known as: Voltaren Apply 2 g topically 4 (four) times daily as needed.   levothyroxine 125 MCG tablet Commonly known as: SYNTHROID Take 1 tablet (125 mcg total) by mouth daily before breakfast. 1 HOUR PRIOR TO OTHER MEDICATIONS   spironolactone 25 MG tablet Commonly known as: ALDACTONE Take 1 tablet (25 mg total) by mouth daily.        Major procedures and Radiology Reports - PLEASE review detailed and final reports for all details, in brief    DG Chest Port 1 View  Result Date:  02/07/2021 CLINICAL DATA:  Chest pain. EXAM: PORTABLE CHEST 1 VIEW COMPARISON:  May 09, 2020 FINDINGS: Mild, diffuse, chronic appearing increased interstitial lung markings are noted. A trace amount of atelectasis is seen within the left lung base. There is no evidence of acute infiltrate, pleural effusion or pneumothorax. The heart size and mediastinal contours are within normal limits. The visualized skeletal structures are unremarkable. IMPRESSION: Chronic appearing increased interstitial lung markings without evidence of acute or active cardiopulmonary disease. Electronically Signed   By: Virgina Norfolk M.D.   On: 02/07/2021 04:35   CT Angio Chest/Abd/Pel for Dissection W and/or Wo Contrast  Result Date: 02/07/2021 CLINICAL DATA:  Chest pain and back pain. Assess for aortic dissection. EXAM: CT ANGIOGRAPHY CHEST, ABDOMEN AND PELVIS TECHNIQUE: Non-contrast CT of the chest was initially obtained. Multidetector CT imaging through the chest, abdomen and pelvis was performed using the standard protocol during bolus administration of intravenous contrast. Multiplanar reconstructed images and MIPs were obtained and reviewed to evaluate the vascular anatomy. CONTRAST:  31mL OMNIPAQUE IOHEXOL 350 MG/ML SOLN COMPARISON:  Chest CT March 18, 2014 FINDINGS: CTA CHEST FINDINGS Cardiovascular: Preferential opacification of the thoracic aorta. No evidence of thoracic aortic aneurysm or dissection. Normal heart size. No pericardial effusion. Atherosclerosis of the aorta is identified. Mediastinum/Nodes: No enlarged mediastinal, hilar, or axillary lymph nodes. Thyroid gland, trachea, and esophagus demonstrate no significant findings. Lungs/Pleura: Mild atelectasis of posterior lung bases are identified. Previously noted subcentimeter pulmonary nodules in the right middle lobe and in the right lower lobe are stable compared to prior CT of 2016. No pleural effusion or pneumothorax. Musculoskeletal: No chest wall  abnormality. No acute or significant osseous findings. Review of the MIP images confirms the above findings. CTA ABDOMEN AND PELVIS FINDINGS VASCULAR Aorta: Normal caliber aorta without aneurysm, dissection, vasculitis or significant stenosis. Celiac: Patent without evidence of aneurysm, dissection, vasculitis or significant stenosis. SMA: Patent without evidence of aneurysm, dissection, vasculitis or significant  stenosis. Renals: Both renal arteries are patent without evidence of aneurysm, dissection, vasculitis, fibromuscular dysplasia or significant stenosis. IMA: Patent without evidence of aneurysm, dissection, vasculitis or significant stenosis. Inflow: No significant abnormality Veins: No obvious venous abnormality within the limitations of this arterial phase study. Review of the MIP images confirms the above findings. NON-VASCULAR Hepatobiliary: No focal liver abnormality is seen. Status post cholecystectomy. No biliary dilatation. Pancreas: Unremarkable. No pancreatic ductal dilatation or surrounding inflammatory changes. Spleen: Normal in size without focal abnormality. Adrenals/Urinary Tract: There is a 1.3 cm low-density mass in the left adrenal gland unchanged compared prior exam 2016. This is probably an adrenal adenoma. Right adrenal gland is normal. The kidneys are normal. There is no hydronephrosis bilaterally. The bladder is normal. Stomach/Bowel: Stomach is within normal limits. Appendix is not seen but no inflammation is noted cecum. No evidence of bowel wall thickening, distention, or inflammatory changes. Lymphatic: No abnormal lymphadenopathy is identified. Reproductive: Status post hysterectomy. No adnexal masses. Other: None. Musculoskeletal: Mild degenerative joint changes of the spine are noted. Review of the MIP images confirms the above findings. IMPRESSION: 1. No evidence of aortic aneurysm or dissection. 2. No acute abnormality identified in the chest, abdomen, and pelvis. 3. 1.3 cm  low-density mass in the left adrenal gland unchanged compared to prior exam of 2016, probably an adrenal adenoma. More than 2 years stability is established. No follow-up is necessary. 4. Stable small pulmonary nodules in the right lung. More than 2 years stability is established, no follow-up is necessary. Electronically Signed   By: Abelardo Diesel M.D.   On: 02/07/2021 09:09    Micro Results   Recent Results (from the past 240 hour(s))  Resp Panel by RT-PCR (Flu A&B, Covid) Nasopharyngeal Swab     Status: None   Collection Time: 02/07/21  6:38 AM   Specimen: Nasopharyngeal Swab; Nasopharyngeal(NP) swabs in vial transport medium  Result Value Ref Range Status   SARS Coronavirus 2 by RT PCR NEGATIVE NEGATIVE Final    Comment: (NOTE) SARS-CoV-2 target nucleic acids are NOT DETECTED.  The SARS-CoV-2 RNA is generally detectable in upper respiratory specimens during the acute phase of infection. The lowest concentration of SARS-CoV-2 viral copies this assay can detect is 138 copies/mL. A negative result does not preclude SARS-Cov-2 infection and should not be used as the sole basis for treatment or other patient management decisions. A negative result may occur with  improper specimen collection/handling, submission of specimen other than nasopharyngeal swab, presence of viral mutation(s) within the areas targeted by this assay, and inadequate number of viral copies(<138 copies/mL). A negative result must be combined with clinical observations, patient history, and epidemiological information. The expected result is Negative.  Fact Sheet for Patients:  EntrepreneurPulse.com.au  Fact Sheet for Healthcare Providers:  IncredibleEmployment.be  This test is no t yet approved or cleared by the [place] FDA and  has been authorized for detection and/or diagnosis of SARS-CoV-2 by FDA under an Emergency Use Authorization (EUA). This EUA will remain  in  effect (meaning this test can be used) for the duration of the COVID-19 declaration under Section 564(b)(1) of the Act, 21 U.S.C.section 360bbb-3(b)(1), unless the authorization is terminated  or revoked sooner.       Influenza A by PCR NEGATIVE NEGATIVE Final   Influenza B by PCR NEGATIVE NEGATIVE Final    Comment: (NOTE) The Xpert Xpress SARS-CoV-2/FLU/RSV plus assay is intended as an aid in the diagnosis of influenza from Nasopharyngeal swab specimens and should  not be used as a sole basis for treatment. Nasal washings and aspirates are unacceptable for Xpert Xpress SARS-CoV-2/FLU/RSV testing.  Fact Sheet for Patients: EntrepreneurPulse.com.au  Fact Sheet for Healthcare Providers: IncredibleEmployment.be  This test is not yet approved or cleared by the [place] FDA and has been authorized for detection and/or diagnosis of SARS-CoV-2 by FDA under an Emergency Use Authorization (EUA). This EUA will remain in effect (meaning this test can be used) for the duration of the COVID-19 declaration under Section 564(b)(1) of the Act, 21 U.S.C. section 360bbb-3(b)(1), unless the authorization is terminated or revoked.  Performed at [facility], [address], [place], [place] [address]        Today   Subjective    [name] [name] today has no new complaints  -Ambulated without dyspnea on exertion or chest with ambulation          Patient has been seen and examined prior to discharge   Objective   Blood pressure 135/76, pulse (!) 52, temperature 98.2 F (36.8 C), temperature source Oral, resp. rate 14, height 5\' 3"  (1.6 m), weight 77.1 kg, SpO2 97 %.  No intake or output data in the 24 hours ending 02/07/21 1052  Exam Gen:- Awake Alert, no acute distress  HEENT:- [place].AT, No sclera icterus Neck-Supple Neck,No JVD,.  Lungs-  CTAB , good air movement bilaterally  CV- S1, S2 normal, regular, consistently reproducible left sterno-  chondral area Tenderness Abd-  +ve B.Sounds, Abd Soft, No tenderness,    Extremity/Skin:- No  edema,   good pulses Psych-affect is appropriate, oriented x3 Neuro-no new focal deficits, no tremors    Data Review   CBC w Diff:  Lab Results  Component Value Date   WBC 5.7 02/07/2021   HGB 17.2 (H) 02/07/2021   HCT 53.3 (H) 02/07/2021   PLT 595 (H) 02/07/2021   LYMPHOPCT 30 09/04/2019   MONOPCT 5 09/04/2019   EOSPCT 6 09/04/2019   BASOPCT 0 09/04/2019    CMP:  Lab Results  Component Value Date   NA 137 02/07/2021   NA 143 09/10/2020   K 3.9 02/07/2021   CL 104 02/07/2021   CO2 24 02/07/2021   BUN 27 (H) 02/07/2021   BUN 22 09/10/2020   CREATININE 1.34 (H) 02/07/2021   PROT 7.7 09/04/2019   ALBUMIN 4.2 09/04/2019   BILITOT 0.5 09/04/2019   ALKPHOS 51 09/04/2019   AST 27 09/04/2019   ALT 23 09/04/2019  .   Total Discharge time is about 33 minutes  Roxan Hockey M.D on 02/07/2021 at 10:52 AM  Go to [email or web address] -  for contact info  [facility] - Office  [phone]

## 2021-02-13 DIAGNOSIS — I129 Hypertensive chronic kidney disease with stage 1 through stage 4 chronic kidney disease, or unspecified chronic kidney disease: Secondary | ICD-10-CM | POA: Diagnosis not present

## 2021-02-13 DIAGNOSIS — N200 Calculus of kidney: Secondary | ICD-10-CM | POA: Diagnosis not present

## 2021-02-13 DIAGNOSIS — N1832 Chronic kidney disease, stage 3b: Secondary | ICD-10-CM | POA: Diagnosis not present

## 2021-02-13 DIAGNOSIS — D751 Secondary polycythemia: Secondary | ICD-10-CM | POA: Diagnosis not present

## 2021-02-13 DIAGNOSIS — I5032 Chronic diastolic (congestive) heart failure: Secondary | ICD-10-CM | POA: Diagnosis not present

## 2021-02-27 DIAGNOSIS — N183 Chronic kidney disease, stage 3 unspecified: Secondary | ICD-10-CM | POA: Diagnosis not present

## 2021-02-27 DIAGNOSIS — I10 Essential (primary) hypertension: Secondary | ICD-10-CM | POA: Diagnosis not present

## 2021-03-12 DIAGNOSIS — Z79899 Other long term (current) drug therapy: Secondary | ICD-10-CM | POA: Diagnosis not present

## 2021-03-12 DIAGNOSIS — I5032 Chronic diastolic (congestive) heart failure: Secondary | ICD-10-CM | POA: Diagnosis not present

## 2021-03-12 DIAGNOSIS — N200 Calculus of kidney: Secondary | ICD-10-CM | POA: Diagnosis not present

## 2021-03-12 DIAGNOSIS — I129 Hypertensive chronic kidney disease with stage 1 through stage 4 chronic kidney disease, or unspecified chronic kidney disease: Secondary | ICD-10-CM | POA: Diagnosis not present

## 2021-03-12 DIAGNOSIS — D751 Secondary polycythemia: Secondary | ICD-10-CM | POA: Diagnosis not present

## 2021-03-12 DIAGNOSIS — N1832 Chronic kidney disease, stage 3b: Secondary | ICD-10-CM | POA: Diagnosis not present

## 2021-03-15 DIAGNOSIS — K449 Diaphragmatic hernia without obstruction or gangrene: Secondary | ICD-10-CM | POA: Diagnosis not present

## 2021-03-15 DIAGNOSIS — N1831 Chronic kidney disease, stage 3a: Secondary | ICD-10-CM | POA: Diagnosis not present

## 2021-03-15 DIAGNOSIS — K5730 Diverticulosis of large intestine without perforation or abscess without bleeding: Secondary | ICD-10-CM | POA: Diagnosis not present

## 2021-03-15 DIAGNOSIS — J0101 Acute recurrent maxillary sinusitis: Secondary | ICD-10-CM | POA: Diagnosis not present

## 2021-03-15 DIAGNOSIS — D3502 Benign neoplasm of left adrenal gland: Secondary | ICD-10-CM | POA: Diagnosis not present

## 2021-03-15 DIAGNOSIS — R109 Unspecified abdominal pain: Secondary | ICD-10-CM | POA: Diagnosis not present

## 2021-03-15 DIAGNOSIS — N3289 Other specified disorders of bladder: Secondary | ICD-10-CM | POA: Diagnosis not present

## 2021-03-15 DIAGNOSIS — N200 Calculus of kidney: Secondary | ICD-10-CM | POA: Diagnosis not present

## 2021-03-15 DIAGNOSIS — E278 Other specified disorders of adrenal gland: Secondary | ICD-10-CM | POA: Diagnosis not present

## 2021-03-19 ENCOUNTER — Ambulatory Visit (HOSPITAL_COMMUNITY)
Admission: RE | Admit: 2021-03-19 | Discharge: 2021-03-19 | Disposition: A | Discharge status: Home / Self Care | Payer: Medicare HMO | Source: Ambulatory Visit | Attending: Nephrology | Admitting: Nephrology

## 2021-03-19 ENCOUNTER — Other Ambulatory Visit: Payer: Self-pay

## 2021-03-19 ENCOUNTER — Other Ambulatory Visit (HOSPITAL_COMMUNITY): Payer: Self-pay | Admitting: Nephrology

## 2021-03-19 DIAGNOSIS — N1832 Chronic kidney disease, stage 3b: Secondary | ICD-10-CM | POA: Insufficient documentation

## 2021-03-19 DIAGNOSIS — N183 Chronic kidney disease, stage 3 unspecified: Secondary | ICD-10-CM | POA: Diagnosis not present

## 2021-03-19 DIAGNOSIS — I5032 Chronic diastolic (congestive) heart failure: Secondary | ICD-10-CM | POA: Diagnosis not present

## 2021-03-19 DIAGNOSIS — M5459 Other low back pain: Secondary | ICD-10-CM | POA: Diagnosis not present

## 2021-03-19 DIAGNOSIS — I10 Essential (primary) hypertension: Secondary | ICD-10-CM | POA: Diagnosis not present

## 2021-03-19 DIAGNOSIS — E790 Hyperuricemia without signs of inflammatory arthritis and tophaceous disease: Secondary | ICD-10-CM | POA: Diagnosis not present

## 2021-03-19 DIAGNOSIS — M545 Low back pain, unspecified: Secondary | ICD-10-CM | POA: Diagnosis not present

## 2021-03-19 DIAGNOSIS — D75838 Other thrombocytosis: Secondary | ICD-10-CM | POA: Diagnosis not present

## 2021-03-19 DIAGNOSIS — R7303 Prediabetes: Secondary | ICD-10-CM | POA: Diagnosis not present

## 2021-03-19 DIAGNOSIS — D751 Secondary polycythemia: Secondary | ICD-10-CM | POA: Diagnosis not present

## 2021-03-19 DIAGNOSIS — D3502 Benign neoplasm of left adrenal gland: Secondary | ICD-10-CM | POA: Diagnosis not present

## 2021-03-19 DIAGNOSIS — M4316 Spondylolisthesis, lumbar region: Secondary | ICD-10-CM | POA: Diagnosis not present

## 2021-03-19 DIAGNOSIS — I129 Hypertensive chronic kidney disease with stage 1 through stage 4 chronic kidney disease, or unspecified chronic kidney disease: Secondary | ICD-10-CM | POA: Diagnosis not present

## 2021-03-19 DIAGNOSIS — E559 Vitamin D deficiency, unspecified: Secondary | ICD-10-CM | POA: Diagnosis not present

## 2021-04-17 ENCOUNTER — Ambulatory Visit (HOSPITAL_COMMUNITY): Payer: Medicare HMO | Admitting: Hematology

## 2021-05-14 DIAGNOSIS — S29012A Strain of muscle and tendon of back wall of thorax, initial encounter: Secondary | ICD-10-CM | POA: Diagnosis not present

## 2021-05-14 DIAGNOSIS — R21 Rash and other nonspecific skin eruption: Secondary | ICD-10-CM | POA: Diagnosis not present

## 2021-05-14 DIAGNOSIS — L739 Follicular disorder, unspecified: Secondary | ICD-10-CM | POA: Diagnosis not present

## 2021-05-14 DIAGNOSIS — X58XXXA Exposure to other specified factors, initial encounter: Secondary | ICD-10-CM | POA: Diagnosis not present

## 2021-05-14 DIAGNOSIS — S29011A Strain of muscle and tendon of front wall of thorax, initial encounter: Secondary | ICD-10-CM | POA: Diagnosis not present

## 2021-05-14 DIAGNOSIS — M79601 Pain in right arm: Secondary | ICD-10-CM | POA: Diagnosis not present

## 2021-05-14 DIAGNOSIS — I10 Essential (primary) hypertension: Secondary | ICD-10-CM | POA: Diagnosis not present

## 2021-05-17 DIAGNOSIS — R21 Rash and other nonspecific skin eruption: Secondary | ICD-10-CM | POA: Diagnosis not present

## 2021-05-17 DIAGNOSIS — B029 Zoster without complications: Secondary | ICD-10-CM | POA: Diagnosis not present

## 2021-05-18 DIAGNOSIS — N1832 Chronic kidney disease, stage 3b: Secondary | ICD-10-CM | POA: Diagnosis not present

## 2021-05-18 DIAGNOSIS — Z0001 Encounter for general adult medical examination with abnormal findings: Secondary | ICD-10-CM | POA: Diagnosis not present

## 2021-05-18 DIAGNOSIS — Z1331 Encounter for screening for depression: Secondary | ICD-10-CM | POA: Diagnosis not present

## 2021-05-18 DIAGNOSIS — E039 Hypothyroidism, unspecified: Secondary | ICD-10-CM | POA: Diagnosis not present

## 2021-05-18 DIAGNOSIS — I10 Essential (primary) hypertension: Secondary | ICD-10-CM | POA: Diagnosis not present

## 2021-05-18 DIAGNOSIS — Z1389 Encounter for screening for other disorder: Secondary | ICD-10-CM | POA: Diagnosis not present

## 2021-05-26 ENCOUNTER — Ambulatory Visit: Payer: Medicare HMO | Admitting: Endocrinology

## 2021-06-21 DIAGNOSIS — W208XXA Other cause of strike by thrown, projected or falling object, initial encounter: Secondary | ICD-10-CM | POA: Diagnosis not present

## 2021-06-21 DIAGNOSIS — S9032XA Contusion of left foot, initial encounter: Secondary | ICD-10-CM | POA: Diagnosis not present

## 2021-06-21 DIAGNOSIS — M7989 Other specified soft tissue disorders: Secondary | ICD-10-CM | POA: Diagnosis not present

## 2021-06-21 DIAGNOSIS — Z79890 Hormone replacement therapy (postmenopausal): Secondary | ICD-10-CM | POA: Diagnosis not present

## 2021-06-21 DIAGNOSIS — E039 Hypothyroidism, unspecified: Secondary | ICD-10-CM | POA: Diagnosis not present

## 2021-06-21 DIAGNOSIS — M79672 Pain in left foot: Secondary | ICD-10-CM | POA: Diagnosis not present

## 2021-06-25 DIAGNOSIS — J0140 Acute pansinusitis, unspecified: Secondary | ICD-10-CM | POA: Diagnosis not present

## 2021-06-30 DIAGNOSIS — H35033 Hypertensive retinopathy, bilateral: Secondary | ICD-10-CM | POA: Diagnosis not present

## 2021-08-03 DIAGNOSIS — E039 Hypothyroidism, unspecified: Secondary | ICD-10-CM | POA: Diagnosis not present

## 2021-08-03 DIAGNOSIS — D751 Secondary polycythemia: Secondary | ICD-10-CM | POA: Diagnosis not present

## 2021-08-03 DIAGNOSIS — I129 Hypertensive chronic kidney disease with stage 1 through stage 4 chronic kidney disease, or unspecified chronic kidney disease: Secondary | ICD-10-CM | POA: Diagnosis not present

## 2021-08-03 DIAGNOSIS — Z79899 Other long term (current) drug therapy: Secondary | ICD-10-CM | POA: Diagnosis not present

## 2021-08-03 DIAGNOSIS — I10 Essential (primary) hypertension: Secondary | ICD-10-CM | POA: Diagnosis not present

## 2021-08-03 DIAGNOSIS — N1832 Chronic kidney disease, stage 3b: Secondary | ICD-10-CM | POA: Diagnosis not present

## 2021-08-03 DIAGNOSIS — Z79890 Hormone replacement therapy (postmenopausal): Secondary | ICD-10-CM | POA: Diagnosis not present

## 2021-08-03 DIAGNOSIS — J019 Acute sinusitis, unspecified: Secondary | ICD-10-CM | POA: Diagnosis not present

## 2021-08-03 DIAGNOSIS — E559 Vitamin D deficiency, unspecified: Secondary | ICD-10-CM | POA: Diagnosis not present

## 2021-08-03 DIAGNOSIS — B9689 Other specified bacterial agents as the cause of diseases classified elsewhere: Secondary | ICD-10-CM | POA: Diagnosis not present

## 2021-08-03 DIAGNOSIS — D3502 Benign neoplasm of left adrenal gland: Secondary | ICD-10-CM | POA: Diagnosis not present

## 2021-08-03 DIAGNOSIS — I517 Cardiomegaly: Secondary | ICD-10-CM | POA: Diagnosis not present

## 2021-08-03 DIAGNOSIS — D75838 Other thrombocytosis: Secondary | ICD-10-CM | POA: Diagnosis not present

## 2021-08-03 DIAGNOSIS — R059 Cough, unspecified: Secondary | ICD-10-CM | POA: Diagnosis not present

## 2021-08-03 DIAGNOSIS — J329 Chronic sinusitis, unspecified: Secondary | ICD-10-CM | POA: Diagnosis not present

## 2021-08-03 DIAGNOSIS — N3000 Acute cystitis without hematuria: Secondary | ICD-10-CM | POA: Diagnosis not present

## 2021-08-03 DIAGNOSIS — I5032 Chronic diastolic (congestive) heart failure: Secondary | ICD-10-CM | POA: Diagnosis not present

## 2021-08-03 DIAGNOSIS — M5459 Other low back pain: Secondary | ICD-10-CM | POA: Diagnosis not present

## 2021-08-03 DIAGNOSIS — I700 Atherosclerosis of aorta: Secondary | ICD-10-CM | POA: Diagnosis not present

## 2021-08-03 DIAGNOSIS — I252 Old myocardial infarction: Secondary | ICD-10-CM | POA: Diagnosis not present

## 2021-08-03 DIAGNOSIS — R7303 Prediabetes: Secondary | ICD-10-CM | POA: Diagnosis not present

## 2021-08-10 ENCOUNTER — Ambulatory Visit (INDEPENDENT_AMBULATORY_CARE_PROVIDER_SITE_OTHER): Payer: Medicare HMO | Admitting: Cardiology

## 2021-08-10 ENCOUNTER — Encounter: Payer: Self-pay | Admitting: Cardiology

## 2021-08-10 VITALS — BP 148/82 | HR 59 | Ht 63.0 in | Wt 177.0 lb

## 2021-08-10 DIAGNOSIS — Z789 Other specified health status: Secondary | ICD-10-CM

## 2021-08-10 DIAGNOSIS — E782 Mixed hyperlipidemia: Secondary | ICD-10-CM

## 2021-08-10 DIAGNOSIS — I25119 Atherosclerotic heart disease of native coronary artery with unspecified angina pectoris: Secondary | ICD-10-CM | POA: Diagnosis not present

## 2021-08-10 DIAGNOSIS — M791 Myalgia, unspecified site: Secondary | ICD-10-CM

## 2021-08-10 DIAGNOSIS — T466X5A Adverse effect of antihyperlipidemic and antiarteriosclerotic drugs, initial encounter: Secondary | ICD-10-CM

## 2021-08-10 NOTE — Patient Instructions (Signed)
Medication Instructions:  Continue all current medications.   Labwork: none  Testing/Procedures: none  Follow-Up: 6 months   Any Other Special Instructions Will Be Listed Below (If Applicable).   If you need a refill on your cardiac medications before your next appointment, please call your pharmacy.  

## 2021-08-10 NOTE — Progress Notes (Signed)
Cardiology Office Note  Date: 08/10/2021   ID: [name], [name] 1946/01/05, MRN [id number]  PCP:  [name], MD  Cardiologist:  [name], MD Electrophysiologist:  None   Chief Complaint  Patient presents with   Cardiac follow-up    History of Present Illness: [name] [name] is a 76 y.o. female last seen in October 2022.  She is here for a follow-up visit.  Reports no angina symptoms or worsening shortness of breath with activity.  She states that she enjoys dancing, enjoys jazz in particular.  Tries to stay active.  She has had trouble with interval shingles, sinus infection, and UTI by report.  She reports fluctuations in blood pressure, not unusual that it is in normal range however by home checks particular and she is not in discomfort.  She states that she has been taking her medications regularly.  I rechecked her blood pressure today from initial assessment finding it to be 148/82.  She is following with Dr. Theador Hawthorne, I reviewed the note from February.  I reviewed her recent lab work, creatinine was 1.35 with normal potassium.  Past Medical History:  Diagnosis Date   Anxiety    Dysphagia 09/03/2020   Essential hypertension    Hypothyroidism    NSTEMI (non-ST elevated myocardial infarction) ([place]) 2015   a.  Related to occlusion of the distal Cx. The mid to distal OM1 also contains a significant stenosis that is best treated with medical therapy. Patent RCA/LAD with tortuosity. LM patent. EF 55%.    Sleep apnea     Past Surgical History:  Procedure Laterality Date   ABDOMINAL HYSTERECTOMY     CARDIAC CATHETERIZATION  ~ 2000   CORONARY ANGIOPLASTY  09/20/2013   LEFT HEART CATHETERIZATION WITH CORONARY ANGIOGRAM N/A 09/20/2013   Procedure: LEFT HEART CATHETERIZATION WITH CORONARY ANGIOGRAM;  Surgeon: Sinclair Grooms, MD;  Location: [facility] CATH LAB;  Service: Cardiovascular;  Laterality: N/A;   LEFT HEART CATHETERIZATION WITH CORONARY ANGIOGRAM N/A  09/27/2013   Procedure: LEFT HEART CATHETERIZATION WITH CORONARY ANGIOGRAM;  Surgeon: Blane Ohara, MD;  Location: [facility] CATH LAB;  Service: Cardiovascular;  Laterality: N/A;   THYROIDECTOMY  10/08/2011   Procedure: THYROIDECTOMY;  Surgeon: [name], MD;  Location: [facility] ORS;  Service: General;  Laterality: N/A;  Total   VAGINAL HYSTERECTOMY  1993    Current Outpatient Medications  Medication Sig Dispense Refill   aspirin EC 81 MG tablet Take 1 tablet (81 mg total) by mouth daily with breakfast. 30 tablet 2   carvedilol (COREG) 25 MG tablet Take 1 tablet (25 mg total) by mouth 2 (two) times daily. 180 tablet 1   chlorthalidone (HYGROTON) 25 MG tablet Take 0.5 tablets (12.5 mg total) by mouth daily. 90 tablet 1   cloNIDine (CATAPRES - DOSED IN MG/24 HR) 0.2 mg/24hr patch Place 1 patch (0.2 mg total) onto the skin once a week. 4 patch 2   diclofenac Sodium (VOLTAREN) 1 % GEL Apply 2 g topically 4 (four) times daily as needed. 50 g 0   levothyroxine (SYNTHROID) 125 MCG tablet Take 1 tablet (125 mcg total) by mouth daily before breakfast. 1 HOUR PRIOR TO OTHER MEDICATIONS 90 tablet 2   spironolactone (ALDACTONE) 25 MG tablet Take 1 tablet (25 mg total) by mouth daily. 90 tablet 3   No current facility-administered medications for this visit.   Allergies:  Fluviral [influenza virus vaccine], Haemophilus influenzae, Amlodipine, Lisinopril, and Ranexa [ranolazine]   ROS: No  palpitations or syncope.  Physical Exam: VS:  BP (!) 148/82   Pulse (!) 59   Ht '5\' 3"'$  (1.6 m)   Wt 177 lb (80.3 kg)   SpO2 96%   BMI 31.35 kg/m , BMI Body mass index is 31.35 kg/m.  Wt Readings from Last 3 Encounters:  08/10/21 177 lb (80.3 kg)  02/07/21 170 lb (77.1 kg)  11/21/20 173 lb 9.6 oz (78.7 kg)    General: Patient appears comfortable at rest. HEENT: Conjunctiva and lids normal, oropharynx clear. Neck: Supple, no elevated JVP or carotid bruits, no thyromegaly. Lungs: Clear to auscultation, nonlabored  breathing at rest. Cardiac: Regular rate and rhythm, no S3 or significant systolic murmur. Extremities: No pitting edema.  ECG:  An ECG dated 02/07/2021 was personally reviewed today and demonstrated:  Sinus rhythm, rule out old anterior infarct pattern.  Recent Labwork: 11/21/2020: TSH 2.36 02/07/2021: BUN 27; Creatinine, Ser 1.34; Hemoglobin 17.2; Platelets 595; Potassium 3.9; Sodium 137     Component Value Date/Time   CHOL 155 11/27/2020 1012   TRIG 65 11/27/2020 1012   HDL 50 11/27/2020 1012   CHOLHDL 3.1 11/27/2020 1012   CHOLHDL 3.6 09/04/2019 1216   VLDL 13 09/04/2019 1216   LDLCALC 92 11/27/2020 1012  June 2023: Hemoglobin 17.6, platelets 520, potassium 3.8, BUN 21, creatinine 1.35, AST 26, ALT 28  Other Studies Reviewed Today:  Renal artery Dopplers 04/24/2020: Summary:  Largest Aortic Diameter: 2.3 cm     Renal:     Right: Normal size right kidney. Normal cortical thickness of right         kidney. Normal right Resisitive Index. No evidence of right         renal artery stenosis. RRV flow present.  Left:  Normal size of left kidney. Normal left Resistive Index.         Normal cortical thickness of the left kidney. No evidence of         left renal artery stenosis. LRV flow present.  Mesenteric:  Normal Celiac artery and Superior Mesenteric artery findings.  IVC is patent.  Assessment and Plan:  1.  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  She does not report any progressive angina symptoms.  She is currently on aspirin and Coreg.  Has history of statin intolerance.  2.  Essential hypertension.  Currently on multimodal treatment including Coreg, chlorthalidone, clonidine patch, and Aldactone.  No changes were made today.  3.  CKD stage IIIb, following with Dr. Theador Hawthorne.  Most recent creatinine 1.35 with normal potassium.  4.  Mixed hyperlipidemia with history of statin intolerance and also intolerance to Zetia.  She is  not interested in pursuing PCSK9 inhibitors based on discussion today.  Last LDL was 92.  Medication Adjustments/Labs and Tests Ordered: Current medicines are reviewed at length with the patient today.  Concerns regarding medicines are outlined above.   Tests Ordered: No orders of the defined types were placed in this encounter.   Medication Changes: No orders of the defined types were placed in this encounter.   Disposition:  Follow up  6 months.  Signed, Satira Sark, MD, [facility] 08/10/2021 11:37 AM    [facility] [place], [place], [place] [address] Phone: [phone]; Fax: [phone]

## 2021-10-22 ENCOUNTER — Telehealth: Payer: Self-pay | Admitting: Cardiology

## 2021-10-22 NOTE — Telephone Encounter (Signed)
Gave date of NSTEMI 09/20/2013 from office visit on 10/17/2013

## 2021-10-22 NOTE — Telephone Encounter (Signed)
Patient would like to confirm the date she had her heart attack.

## 2021-10-26 DIAGNOSIS — N1832 Chronic kidney disease, stage 3b: Secondary | ICD-10-CM | POA: Diagnosis not present

## 2021-10-28 DIAGNOSIS — D751 Secondary polycythemia: Secondary | ICD-10-CM | POA: Diagnosis not present

## 2021-10-28 DIAGNOSIS — D75838 Other thrombocytosis: Secondary | ICD-10-CM | POA: Diagnosis not present

## 2021-10-28 DIAGNOSIS — I129 Hypertensive chronic kidney disease with stage 1 through stage 4 chronic kidney disease, or unspecified chronic kidney disease: Secondary | ICD-10-CM | POA: Diagnosis not present

## 2021-10-28 DIAGNOSIS — N1832 Chronic kidney disease, stage 3b: Secondary | ICD-10-CM | POA: Diagnosis not present

## 2021-10-28 DIAGNOSIS — N393 Stress incontinence (female) (male): Secondary | ICD-10-CM | POA: Diagnosis not present

## 2021-10-28 DIAGNOSIS — E038 Other specified hypothyroidism: Secondary | ICD-10-CM | POA: Diagnosis not present

## 2021-10-28 DIAGNOSIS — E559 Vitamin D deficiency, unspecified: Secondary | ICD-10-CM | POA: Diagnosis not present

## 2021-10-28 DIAGNOSIS — I5032 Chronic diastolic (congestive) heart failure: Secondary | ICD-10-CM | POA: Diagnosis not present

## 2021-11-06 DIAGNOSIS — M791 Myalgia, unspecified site: Secondary | ICD-10-CM | POA: Insufficient documentation

## 2021-11-20 ENCOUNTER — Inpatient Hospital Stay: Payer: Medicare HMO | Attending: Hematology | Admitting: Hematology

## 2021-11-20 ENCOUNTER — Inpatient Hospital Stay: Payer: Medicare HMO | Admitting: Hematology

## 2021-11-20 ENCOUNTER — Inpatient Hospital Stay: Payer: Medicare HMO

## 2021-11-20 VITALS — BP 203/111 | HR 81 | Temp 98.4°F | Resp 18 | Ht 63.0 in | Wt 175.3 lb

## 2021-11-20 DIAGNOSIS — D75839 Thrombocytosis, unspecified: Secondary | ICD-10-CM | POA: Insufficient documentation

## 2021-11-20 DIAGNOSIS — Z90710 Acquired absence of both cervix and uterus: Secondary | ICD-10-CM | POA: Diagnosis not present

## 2021-11-20 DIAGNOSIS — Z800 Family history of malignant neoplasm of digestive organs: Secondary | ICD-10-CM | POA: Insufficient documentation

## 2021-11-20 DIAGNOSIS — I10 Essential (primary) hypertension: Secondary | ICD-10-CM | POA: Insufficient documentation

## 2021-11-20 DIAGNOSIS — D751 Secondary polycythemia: Secondary | ICD-10-CM | POA: Diagnosis not present

## 2021-11-20 LAB — CBC WITH DIFFERENTIAL/PLATELET
Abs Immature Granulocytes: 0.03 10*3/uL (ref 0.00–0.07)
Basophils Absolute: 0 10*3/uL (ref 0.0–0.1)
Basophils Relative: 1 %
Eosinophils Absolute: 0.3 10*3/uL (ref 0.0–0.5)
Eosinophils Relative: 4 %
HCT: 51.1 % — ABNORMAL HIGH (ref 36.0–46.0)
Hemoglobin: 16.9 g/dL — ABNORMAL HIGH (ref 12.0–15.0)
Immature Granulocytes: 1 %
Lymphocytes Relative: 23 %
Lymphs Abs: 1.5 10*3/uL (ref 0.7–4.0)
MCH: 30 pg (ref 26.0–34.0)
MCHC: 33.1 g/dL (ref 30.0–36.0)
MCV: 90.6 fL (ref 80.0–100.0)
Monocytes Absolute: 0.3 10*3/uL (ref 0.1–1.0)
Monocytes Relative: 5 %
Neutro Abs: 4.4 10*3/uL (ref 1.7–7.7)
Neutrophils Relative %: 66 %
Platelets: 613 10*3/uL — ABNORMAL HIGH (ref 150–400)
RBC: 5.64 MIL/uL — ABNORMAL HIGH (ref 3.87–5.11)
RDW: 15.7 % — ABNORMAL HIGH (ref 11.5–15.5)
WBC: 6.5 10*3/uL (ref 4.0–10.5)
nRBC: 0 % (ref 0.0–0.2)

## 2021-11-20 LAB — SEDIMENTATION RATE: Sed Rate: 13 mm/hr (ref 0–22)

## 2021-11-20 LAB — C-REACTIVE PROTEIN: CRP: 0.5 mg/dL (ref <1.0)

## 2021-11-20 NOTE — Progress Notes (Signed)
CONSULT NOTE  Patient Care Team: [name], MD as PCP - General (Internal Medicine) Satira Sark, MD as PCP - Cardiology (Cardiology) Skeet Latch, MD as Attending Physician (Cardiology) Derek Jack, MD as Medical Oncologist (Hematology)  CHIEF COMPLAINTS/PURPOSE OF CONSULTATION:  Erythrocytosis and thrombocytosis  HISTORY OF PRESENTING ILLNESS:  [name] [name] 76 y.o. female is seen at the request of Dr. Theador Hawthorne for further work-up and management of erythrocytosis and thrombocytosis.  CBC on 10/26/2021 showed white count 7.3, hemoglobin 16.4, hematocrit 50.2 and platelet count 541.  Differential was normal.  Review of labs in our EMR showed elevated hemoglobin since 2019, and elevated platelet count since August 2015.  She does not report any history of thromboembolism/MI/CVA.  Denies any aquagenic pruritus or vasomotor symptoms.  Denies any B symptoms including fevers, night sweats or weight loss.  Had 1 sinus infection earlier this year.  Usually has 1-2 sinus infections a year but no other infections.  She lives at home with her family.  She is a never smoker.  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  No exposure to chemicals.  No family history of leukemias or malignancies.  No personal history of sleep apnea.    MEDICAL HISTORY:  Past Medical History:  Diagnosis Date   Anxiety    Dysphagia 09/03/2020   Essential hypertension    Hypothyroidism    NSTEMI (non-ST elevated myocardial infarction) ([place]) 2015   a.  Related to occlusion of the distal Cx. The mid to distal OM1 also contains a significant stenosis that is best treated with medical therapy. Patent RCA/LAD with tortuosity. LM patent. EF 55%.    Sleep apnea     SURGICAL HISTORY: Past Surgical History:  Procedure Laterality Date   ABDOMINAL HYSTERECTOMY     CARDIAC CATHETERIZATION  ~ 2000   CORONARY ANGIOPLASTY  09/20/2013   LEFT HEART CATHETERIZATION WITH  CORONARY ANGIOGRAM N/A 09/20/2013   Procedure: LEFT HEART CATHETERIZATION WITH CORONARY ANGIOGRAM;  Surgeon: Sinclair Grooms, MD;  Location: [facility] CATH LAB;  Service: Cardiovascular;  Laterality: N/A;   LEFT HEART CATHETERIZATION WITH CORONARY ANGIOGRAM N/A 09/27/2013   Procedure: LEFT HEART CATHETERIZATION WITH CORONARY ANGIOGRAM;  Surgeon: Blane Ohara, MD;  Location: [facility] CATH LAB;  Service: Cardiovascular;  Laterality: N/A;   THYROIDECTOMY  10/08/2011   Procedure: THYROIDECTOMY;  Surgeon: [name], MD;  Location: [facility] ORS;  Service: General;  Laterality: N/A;  Total   VAGINAL HYSTERECTOMY  1993    SOCIAL HISTORY: Social History   Socioeconomic History   Marital status: Widowed    Spouse name: Not on file   Number of children: Not on file   Years of education: Not on file   Highest education level: Not on file  Occupational History   Not on file  Tobacco Use   Smoking status: Never   Smokeless tobacco: Never  Vaping Use   Vaping Use: Never used  Substance and Sexual Activity   Alcohol use: No    Alcohol/week: 0.0 standard drinks of alcohol   Drug use: No   Sexual activity: Never  Other Topics Concern   Not on file  Social History Narrative   ** Merged History Encounter **  Lives at home with family.  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      Social Determinants of Health   Financial Resource Strain: Low Risk  (07/21/2020)   Overall Financial Resource Strain (CARDIA)    Difficulty of Paying Living  Expenses: Not very hard  Food Insecurity: No Food Insecurity (07/21/2020)   Hunger Vital Sign    Worried About Running Out of Food in the Last Year: Never true    Ran Out of Food in the Last Year: Never true  Transportation Needs: No Transportation Needs (09/08/2020)   PRAPARE - Hydrologist (Medical): No    Lack of Transportation (Non-Medical): No  Physical Activity: Sufficiently Active (03/11/2020)   Exercise Vital Sign    Days of Exercise per Week: 5  days    Minutes of Exercise per Session: 30 min  Stress: No Stress Concern Present (03/11/2020)   [place]    Feeling of Stress : Not at all  Social Connections: Not on file  Intimate Partner Violence: Not on file    FAMILY HISTORY: Family History  Problem Relation Age of Onset   Colon cancer Father    Thyroid disease Neg Hx     ALLERGIES:  is allergic to fluviral [influenza virus vaccine], haemophilus influenzae, amlodipine, lisinopril, and ranexa [ranolazine].  MEDICATIONS:  Current Outpatient Medications  Medication Sig Dispense Refill   aspirin EC 81 MG tablet Take 1 tablet (81 mg total) by mouth daily with breakfast. 30 tablet 2   carvedilol (COREG) 25 MG tablet Take 1 tablet (25 mg total) by mouth 2 (two) times daily. 180 tablet 1   chlorthalidone (HYGROTON) 25 MG tablet Take 0.5 tablets (12.5 mg total) by mouth daily. 90 tablet 1   cloNIDine (CATAPRES - DOSED IN MG/24 HR) 0.2 mg/24hr patch Place 1 patch (0.2 mg total) onto the skin once a week. 4 patch 2   diclofenac Sodium (VOLTAREN) 1 % GEL Apply 2 g topically 4 (four) times daily as needed. 50 g 0   gabapentin (NEURONTIN) 300 MG capsule Take 300 mg by mouth 3 (three) times daily.     levothyroxine (SYNTHROID) 125 MCG tablet Take 1 tablet (125 mcg total) by mouth daily before breakfast. 1 HOUR PRIOR TO OTHER MEDICATIONS 90 tablet 2   spironolactone (ALDACTONE) 25 MG tablet Take 1 tablet (25 mg total) by mouth daily. 90 tablet 3   No current facility-administered medications for this visit.    REVIEW OF SYSTEMS:   Constitutional: Denies fevers, chills or abnormal night sweats Eyes: Denies blurriness of vision, double vision or watery eyes Ears, nose, mouth, throat, and face: Denies mucositis or sore throat Respiratory: Denies cough, dyspnea or wheezes Cardiovascular: Denies palpitation, chest discomfort or lower extremity swelling Gastrointestinal:   Denies nausea, heartburn or change in bowel habits Skin: Denies abnormal skin rashes Lymphatics: Denies new lymphadenopathy or easy bruising Neurological:Denies numbness, tingling or new weaknesses Behavioral/Psych: Mood is stable, no new changes  All other systems were reviewed with the patient and are negative.  PHYSICAL EXAMINATION: ECOG PERFORMANCE STATUS: 1 - Symptomatic but completely ambulatory  Vitals:   11/20/21 1006  BP: (!) 203/111  Pulse: 81  Resp: 18  Temp: 98.4 F (36.9 C)  SpO2: 98%   Filed Weights   11/20/21 1006  Weight: 175 lb 4.8 oz (79.5 kg)    GENERAL:alert, no distress and comfortable SKIN: skin color, texture, turgor are normal, no rashes or significant lesions EYES: normal, conjunctiva are pink and non-injected, sclera clear OROPHARYNX:no exudate, no erythema and lips, buccal mucosa, and tongue normal  NECK: supple, thyroid normal size, non-tender, without nodularity LYMPH:  no palpable lymphadenopathy in the cervical, axillary or inguinal LUNGS: clear to  auscultation and percussion with normal breathing effort HEART: regular rate & rhythm and no murmurs and no lower extremity edema ABDOMEN:abdomen soft, non-tender and normal bowel sounds Musculoskeletal:no cyanosis of digits and no clubbing  PSYCH: alert & oriented x 3 with fluent speech NEURO: no focal motor/sensory deficits  LABORATORY DATA:  I have reviewed the data as listed No results found for this or any previous visit (from the past 2160 hour(s)).  RADIOGRAPHIC STUDIES: I have personally reviewed the radiological images as listed and agreed with the findings in the report. No results found.  ASSESSMENT:  1.  Erythrocytosis and thrombocytosis: - Patient seen at the request of Dr. Theador Hawthorne for elevated hemoglobin and platelet count. - Review of EMR shows platelet count elevated since August 2015, ranging between 425-613. - Hemoglobin elevated since January 2019, ranging between  15.5-17.2. - Does not report any aquagenic pruritus or vasomotor symptoms.  No prior history of thrombosis.  No B symptoms.  2.  Social/family history: - Lives at home with her family.  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  She was never smoker. - No family history of leukemias or malignancies.   PLAN:  1.  Erythrocytosis and thrombocytosis: - We discussed differential diagnosis of myeloproliferative neoplasms. - We will check CBC today along with serum EPO level, ESR and CRP.  We will also check JAK2 V617F with reflex testing as well as BCR/ABL by FISH. - We will see her back in 4 weeks for follow-up to discuss results and further plan.    All questions were answered. The patient knows to call the clinic with any problems, questions or concerns.      Derek Jack, MD 11/20/21 11:16 AM

## 2021-11-20 NOTE — Patient Instructions (Addendum)
[facility]  Discharge Instructions  You were seen and examined today by Dr. [name]. Dr. [name] is a hematologist, meaning that he specializes in blood abnormalities. Dr. [name] discussed your past medical history, family history of cancers/blood conditions and the events that led to you being here today.  You were referred to Dr. [name] due to abnormal blood work. You were referred for Thrombocytosis (elevated platelets). Your platelets are the clotting factor within the blood. Your red blood cells and hemoglobin are also elevated. Dr. [name] has recommended additional lab work today for further evaluation - he is checking for a condition known as Polycythemia.  Follow-up as scheduled.  Thank you for choosing [facility] to provide your oncology and hematology care.   To afford each patient quality time with our provider, please arrive at least 15 minutes before your scheduled appointment time. You may need to reschedule your appointment if you arrive late (10 or more minutes). Arriving late affects you and other patients whose appointments are after yours.  Also, if you miss three or more appointments without notifying the office, you may be dismissed from the clinic at the provider's discretion.    Again, thank you for choosing [facility].  Our hope is that these requests will decrease the amount of time that you wait before being seen by our physicians.   If you have a lab appointment with the [facility] please come in thru the Main Entrance and check in at the main information desk.           _____________________________________________________________  Should you have questions after your visit to [facility], please contact our office at [phone] and follow the prompts.  Our office hours are 8:00 a.m. to 4:30 p.m. Monday - Thursday and 8:00 a.m. to 2:30 p.m. Friday.  Please note  that voicemails left after 4:00 p.m. may not be returned until the following business day.  We are closed weekends and all major holidays.  You do have access to a nurse 24-7, just call the main number to the clinic [phone] and do not press any options, hold on the line and a nurse will answer the phone.    For prescription refill requests, have your pharmacy contact our office and allow 72 hours.    Masks are optional in the cancer centers. If you would like for your care team to wear a mask while they are taking care of you, please let them know. You may have one support person who is at least 75 years old accompany you for your appointments.

## 2021-11-21 LAB — ERYTHROPOIETIN: Erythropoietin: 1.8 m[IU]/mL — ABNORMAL LOW (ref 2.6–18.5)

## 2021-11-24 LAB — BCR-ABL1 FISH
Cells Analyzed: 200
Cells Counted: 200

## 2021-11-27 LAB — JAK2 V617F RFX CALR/MPL/E12-15: JAK2 V617F %: 26.03 %

## 2021-12-03 ENCOUNTER — Ambulatory Visit: Payer: Medicare HMO | Admitting: Cardiology

## 2021-12-03 NOTE — Progress Notes (Deleted)
Cardiology Office Note  Date: 12/03/2021   ID: [name], [name] 16-Jun-1945, MRN [id number]  PCP:  [name], MD  Cardiologist:  [name], MD Electrophysiologist:  None   No chief complaint on file.   History of Present Illness: [name] [name] is a 76 y.o. female last seen in July.  She is being evaluated by Dr. [name] for erythrocytosis and thrombocytosis, I reviewed the recent note.  Past Medical History:  Diagnosis Date   Anxiety    Dysphagia 09/03/2020   Essential hypertension    Hypothyroidism    NSTEMI (non-ST elevated myocardial infarction) ([place]) 2015   a.  Related to occlusion of the distal Cx. The mid to distal OM1 also contains a significant stenosis that is best treated with medical therapy. Patent RCA/LAD with tortuosity. LM patent. EF 55%.    Sleep apnea     Past Surgical History:  Procedure Laterality Date   ABDOMINAL HYSTERECTOMY     CARDIAC CATHETERIZATION  ~ 2000   CORONARY ANGIOPLASTY  09/20/2013   LEFT HEART CATHETERIZATION WITH CORONARY ANGIOGRAM N/A 09/20/2013   Procedure: LEFT HEART CATHETERIZATION WITH CORONARY ANGIOGRAM;  Surgeon: Sinclair Grooms, MD;  Location: [facility] CATH LAB;  Service: Cardiovascular;  Laterality: N/A;   LEFT HEART CATHETERIZATION WITH CORONARY ANGIOGRAM N/A 09/27/2013   Procedure: LEFT HEART CATHETERIZATION WITH CORONARY ANGIOGRAM;  Surgeon: Blane Ohara, MD;  Location: [facility] CATH LAB;  Service: Cardiovascular;  Laterality: N/A;   THYROIDECTOMY  10/08/2011   Procedure: THYROIDECTOMY;  Surgeon: [name], MD;  Location: [facility] ORS;  Service: General;  Laterality: N/A;  Total   VAGINAL HYSTERECTOMY  1993    Current Outpatient Medications  Medication Sig Dispense Refill   aspirin EC 81 MG tablet Take 1 tablet (81 mg total) by mouth daily with breakfast. 30 tablet 2   carvedilol (COREG) 25 MG tablet Take 1 tablet (25 mg total) by mouth 2 (two) times daily. 180 tablet 1   chlorthalidone (HYGROTON)  25 MG tablet Take 0.5 tablets (12.5 mg total) by mouth daily. 90 tablet 1   cloNIDine (CATAPRES - DOSED IN MG/24 HR) 0.2 mg/24hr patch Place 1 patch (0.2 mg total) onto the skin once a week. 4 patch 2   diclofenac Sodium (VOLTAREN) 1 % GEL Apply 2 g topically 4 (four) times daily as needed. 50 g 0   gabapentin (NEURONTIN) 300 MG capsule Take 300 mg by mouth 3 (three) times daily.     levothyroxine (SYNTHROID) 125 MCG tablet Take 1 tablet (125 mcg total) by mouth daily before breakfast. 1 HOUR PRIOR TO OTHER MEDICATIONS 90 tablet 2   spironolactone (ALDACTONE) 25 MG tablet Take 1 tablet (25 mg total) by mouth daily. 90 tablet 3   No current facility-administered medications for this visit.   Allergies:  Fluviral [influenza virus vaccine], Haemophilus influenzae, Amlodipine, Lisinopril, and Ranexa [ranolazine]   Social History: The patient  reports that she has never smoked. She has never used smokeless tobacco. She reports that she does not drink alcohol and does not use drugs.   Family History: The patient's family history includes Colon cancer in her father.   ROS:  Please see the history of present illness. Otherwise, complete review of systems is positive for {NONE DEFAULTED:18576}.  All other systems are reviewed and negative.   Physical Exam: VS:  There were no vitals taken for this visit., BMI There is no height or weight on file to calculate BMI.  Wt Readings  from Last 3 Encounters:  11/20/21 175 lb 4.8 oz (79.5 kg)  08/10/21 177 lb (80.3 kg)  02/07/21 170 lb (77.1 kg)    General: Patient appears comfortable at rest. HEENT: Conjunctiva and lids normal, oropharynx clear with moist mucosa. Neck: Supple, no elevated JVP or carotid bruits, no thyromegaly. Lungs: Clear to auscultation, nonlabored breathing at rest. Cardiac: Regular rate and rhythm, no S3 or significant systolic murmur, no pericardial rub. Abdomen: Soft, nontender, no hepatomegaly, bowel sounds present, no guarding or  rebound. Extremities: No pitting edema, distal pulses 2+. Skin: Warm and dry. Musculoskeletal: No kyphosis. Neuropsychiatric: Alert and oriented x3, affect grossly appropriate.  ECG:  An ECG dated 02/07/2021 was personally reviewed today and demonstrated:  Sinus rhythm with old anterior infarct pattern.  Recent Labwork: 02/07/2021: BUN 27; Creatinine, Ser 1.34; Potassium 3.9; Sodium 137 11/20/2021: Hemoglobin 16.9; Platelets 613     Component Value Date/Time   CHOL 155 11/27/2020 1012   TRIG 65 11/27/2020 1012   HDL 50 11/27/2020 1012   CHOLHDL 3.1 11/27/2020 1012   CHOLHDL 3.6 09/04/2019 1216   VLDL 13 09/04/2019 1216   LDLCALC 92 11/27/2020 1012  September 2023: Hemoglobin 16.4, platelets 541, BUN 22, creatinine 1.39, potassium 4.3  Other Studies Reviewed Today:  Renal artery Dopplers 04/24/2020: Summary:  Largest Aortic Diameter: 2.3 cm     Renal:     Right: Normal size right kidney. Normal cortical thickness of right         kidney. Normal right Resisitive Index. No evidence of right         renal artery stenosis. RRV flow present.  Left:  Normal size of left kidney. Normal left Resistive Index.         Normal cortical thickness of the left kidney. No evidence of         left renal artery stenosis. LRV flow present.  Mesenteric:  Normal Celiac artery and Superior Mesenteric artery findings.  IVC is patent.  Assessment and Plan:    Medication Adjustments/Labs and Tests Ordered: Current medicines are reviewed at length with the patient today.  Concerns regarding medicines are outlined above.   Tests Ordered: No orders of the defined types were placed in this encounter.   Medication Changes: No orders of the defined types were placed in this encounter.   Disposition:  Follow up {follow up:15908}  Signed, Satira Sark, MD, [facility] 12/03/2021 10:46 AM    [facility] [address], [place], [place] [address] Phone: [phone]; Fax: [phone]

## 2021-12-04 ENCOUNTER — Telehealth: Payer: Self-pay | Admitting: Cardiology

## 2021-12-04 NOTE — Telephone Encounter (Signed)
*  STAT* If patient is at the pharmacy, call can be transferred to refill team.   1. Which medications need to be refilled? (please list name of each medication and dose if known) cloNIDine (CATAPRES - DOSED IN MG/24 HR) 0.3 mg/24hr patch  2. Which pharmacy/location (including street and city if local pharmacy) is medication to be sent to?[place], [place] - [place]  3. Do they need a 30 day or 90 day supply? 90 day

## 2021-12-12 NOTE — Progress Notes (Unsigned)
[name] [name], [place] [address]   CLINIC:  Medical Oncology/Hematology  PCP:  [name] Meiers, [place] [id number] [phone]   REASON FOR VISIT:  Follow-up for erythrocytosis and thrombocytosis  PRIOR THERAPY: None  CURRENT THERAPY: Under work-up  INTERVAL HISTORY:  [name] [name] 76 y.o. 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  She was seen for initial consultation by Dr. Delton [name] on 11/20/2021.  At today's visit, she reports feeling fairly well.  She denies any changes in her baseline health status since her visit with Dr. Delton [name] 3 weeks ago.  She continues to deny any history of DVT, PE, MI, CVA.  No aquagenic pruritus or vasomotor symptoms.  She denies any B symptoms such as fever, night sweats, or weight loss.  She reports sinus infections 1-2 times a year, but no other infections.  She has 100% energy and 100% appetite. She endorses that she is maintaining a stable weight.    REVIEW OF SYSTEMS:  Review of Systems  Constitutional:  Negative for appetite change, chills, diaphoresis, fatigue, fever and unexpected weight change.  HENT:   Negative for lump/mass and nosebleeds.   Eyes:  Negative for eye problems.  Respiratory:  Negative for cough, hemoptysis and shortness of breath.   Cardiovascular:  Negative for chest pain, leg swelling and palpitations.  Gastrointestinal:  Negative for abdominal pain, blood in stool, constipation, diarrhea, nausea and vomiting.  Genitourinary:  Positive for difficulty urinating. Negative for hematuria.   Skin: Negative.   Neurological:  Positive for headaches. Negative for dizziness and light-headedness.  Hematological:  Does not bruise/bleed easily.      PAST MEDICAL/SURGICAL HISTORY:  Past Medical History:  Diagnosis Date   Anxiety    Dysphagia 09/03/2020   Essential hypertension    Hypothyroidism    NSTEMI  (non-ST elevated myocardial infarction) ([place]) 2015   a.  Related to occlusion of the distal Cx. The mid to distal OM1 also contains a significant stenosis that is best treated with medical therapy. Patent RCA/LAD with tortuosity. LM patent. EF 55%.    Sleep apnea    Past Surgical History:  Procedure Laterality Date   ABDOMINAL HYSTERECTOMY     CARDIAC CATHETERIZATION  ~ 2000   CORONARY ANGIOPLASTY  09/20/2013   LEFT HEART CATHETERIZATION WITH CORONARY ANGIOGRAM N/A 09/20/2013   Procedure: LEFT HEART CATHETERIZATION WITH CORONARY ANGIOGRAM;  Surgeon: Sinclair Grooms, MD;  Location: [facility] CATH LAB;  Service: Cardiovascular;  Laterality: N/A;   LEFT HEART CATHETERIZATION WITH CORONARY ANGIOGRAM N/A 09/27/2013   Procedure: LEFT HEART CATHETERIZATION WITH CORONARY ANGIOGRAM;  Surgeon: Blane Ohara, MD;  Location: [facility] CATH LAB;  Service: Cardiovascular;  Laterality: N/A;   THYROIDECTOMY  10/08/2011   Procedure: THYROIDECTOMY;  Surgeon: [name], MD;  Location: [facility] ORS;  Service: General;  Laterality: N/A;  Total   VAGINAL HYSTERECTOMY  1993     SOCIAL HISTORY:  Social History   Socioeconomic History   Marital status: Widowed    Spouse name: Not on file   Number of children: Not on file   Years of education: Not on file   Highest education level: Not on file  Occupational History   Not on file  Tobacco Use   Smoking status: Never   Smokeless tobacco: Never  Vaping Use   Vaping Use: Never used  Substance and Sexual Activity   Alcohol use: No    Alcohol/week: 0.0  standard drinks of alcohol   Drug use: No   Sexual activity: Never  Other Topics Concern   Not on file  Social History Narrative   ** Merged History Encounter **  Lives at home with family.  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      Social Determinants of Health   Financial Resource Strain: Low Risk  (07/21/2020)   Overall Financial Resource Strain (CARDIA)    Difficulty of Paying Living Expenses: Not very hard  Food  Insecurity: No Food Insecurity (07/21/2020)   Hunger Vital Sign    Worried About Running Out of Food in the Last Year: Never true    Ran Out of Food in the Last Year: Never true  Transportation Needs: No Transportation Needs (09/08/2020)   PRAPARE - Hydrologist (Medical): No    Lack of Transportation (Non-Medical): No  Physical Activity: Sufficiently Active (03/11/2020)   Exercise Vital Sign    Days of Exercise per Week: 5 days    Minutes of Exercise per Session: 30 min  Stress: No Stress Concern Present (03/11/2020)   [place]    Feeling of Stress : Not at all  Social Connections: Not on file  Intimate Partner Violence: Not on file    FAMILY HISTORY:  Family History  Problem Relation Age of Onset   Colon cancer Father    Thyroid disease Neg Hx     CURRENT MEDICATIONS:  Outpatient Encounter Medications as of 12/14/2021  Medication Sig   aspirin EC 81 MG tablet Take 1 tablet (81 mg total) by mouth daily with breakfast.   carvedilol (COREG) 25 MG tablet Take 1 tablet (25 mg total) by mouth 2 (two) times daily.   chlorthalidone (HYGROTON) 25 MG tablet Take 0.5 tablets (12.5 mg total) by mouth daily.   cloNIDine (CATAPRES - DOSED IN MG/24 HR) 0.2 mg/24hr patch Place 1 patch (0.2 mg total) onto the skin once a week.   diclofenac Sodium (VOLTAREN) 1 % GEL Apply 2 g topically 4 (four) times daily as needed.   gabapentin (NEURONTIN) 300 MG capsule Take 300 mg by mouth 3 (three) times daily.   levothyroxine (SYNTHROID) 125 MCG tablet Take 1 tablet (125 mcg total) by mouth daily before breakfast. 1 HOUR PRIOR TO OTHER MEDICATIONS   spironolactone (ALDACTONE) 25 MG tablet Take 1 tablet (25 mg total) by mouth daily.   No facility-administered encounter medications on file as of 12/14/2021.    ALLERGIES:  Allergies  Allergen Reactions   Fluviral [Influenza Virus Vaccine] Shortness Of Breath,  Swelling and Other (See Comments)    Including back pain that radiates in upper extremeties   Haemophilus Influenzae Other (See Comments), Shortness Of Breath and Swelling    Including back pain that radiates in upper extremeties   Amlodipine     Lip swelling per patient.    Lisinopril     Dr. Legrand Rams stopped due to side effects per patient.  Could remember what side effects were.     Ranexa [Ranolazine]     Chest pain     PHYSICAL EXAM:  ECOG PERFORMANCE STATUS: 0 - Asymptomatic  There were no vitals filed for this visit. There were no vitals filed for this visit. Physical Exam Constitutional:      Appearance: Normal appearance. She is obese.  HENT:     Head: Normocephalic and atraumatic.     Mouth/Throat:     Mouth: Mucous membranes are moist.  Eyes:     Extraocular Movements: Extraocular movements intact.     Pupils: Pupils are equal, round, and reactive to light.  Cardiovascular:     Rate and Rhythm: Normal rate and regular rhythm.     Pulses: Normal pulses.     Heart sounds: Normal heart sounds.  Pulmonary:     Effort: Pulmonary effort is normal.     Breath sounds: Normal breath sounds.  Abdominal:     General: Bowel sounds are normal.     Palpations: Abdomen is soft.     Tenderness: There is no abdominal tenderness.  Musculoskeletal:        General: No swelling.     Right lower leg: No edema.     Left lower leg: No edema.  Lymphadenopathy:     Cervical: No cervical adenopathy.  Skin:    General: Skin is warm and dry.  Neurological:     General: No focal deficit present.     Mental Status: She is alert and oriented to person, place, and time.  Psychiatric:        Mood and Affect: Mood normal.        Behavior: Behavior normal.      LABORATORY DATA:  I have reviewed the labs as listed.  CBC    Component Value Date/Time   WBC 6.5 11/20/2021 1118   RBC 5.64 (H) 11/20/2021 1118   HGB 16.9 (H) 11/20/2021 1118   HCT 51.1 (H) 11/20/2021 1118   PLT 613 (H)  11/20/2021 1118   MCV 90.6 11/20/2021 1118   MCH 30.0 11/20/2021 1118   MCHC 33.1 11/20/2021 1118   RDW 15.7 (H) 11/20/2021 1118   LYMPHSABS 1.5 11/20/2021 1118   MONOABS 0.3 11/20/2021 1118   EOSABS 0.3 11/20/2021 1118   BASOSABS 0.0 11/20/2021 1118      Latest Ref Rng & Units 02/07/2021    4:50 AM 09/10/2020   11:16 AM 07/28/2020    9:17 AM  CMP  Glucose 70 - 99 mg/dL 97  87  119   BUN 8 - 23 mg/dL _0 Creatinine 0.44 - 1.00 mg/dL 1.34  1.16  1.04   Sodium 135 - 145 mmol/L 137  143  140   Potassium 3.5 - 5.1 mmol/L 3.9  4.5  4.6   Chloride 98 - 111 mmol/L 104  100  102   CO2 22 - 32 mmol/L _1 Calcium 8.9 - 10.3 mg/dL 9.2  9.7  9.9     DIAGNOSTIC IMAGING:  I have independently reviewed the relevant imaging and discussed with the patient.  ASSESSMENT & PLAN: 1.  JAK2 positive polycythemia vera - Seen at request of Dr. Theador Hawthorne for elevated hemoglobin and platelet count - Review of EMR shows platelet count elevated since August 2015, ranging between 425-613 - Hemoglobin elevated since January 2019, ranging between 15.5-78.2 - No aquagenic pruritus or vasomotor symptoms.  No B symptoms. - No history of DVT, PE, MI, or CVA. - She is a lifelong non-smoker and denies any chemical exposure or known history of sleep apnea. - Hematology work-up (11/20/2021): JAK2 [id number] positive Low erythropoietin 1.8, in keeping with polycythemia vera BCR ABL FISH negative.  ESR and CRP negative. - Most recent CBC (11/20/2021): Hgb 16.9/HCT 51.1, platelets 613 - PLAN: Extensive discussion with patient regarding diagnosis of polycythemia vera, including risks of thrombosis as well as risk of myelofibrosis and leukemia in some patients. - Based on  her age, she is considered high risk and therefore recommend cytoreductive therapy with hydroxyurea.  We discussed potential side effects of hydroxyurea. - Patient is extremely hesitant to start any medications, reports that she would like  to do her own research first since the medical field has been so heavily influenced by the "[employer]."  We did discuss the risks of NOT receiving treatment for her polycythemia vera, and she verbalizes understanding and acceptance of these risks for the time being. - We did also discuss that if she does not want to be on Hydrea for her elevated Hgb/platelets, we could at least treat her elevated hemoglobin with intermittent therapeutic phlebotomy, although this would have no effect on her platelets. - She is also hesitant to start aspirin 81 mg daily due to concern that it would "thin her blood too much."  Education was provided to reinforce that antiplatelet agent such as aspirin would decrease the risk of thrombosis in the case of her MPN.  She reports that she will think about this and decide at a later time. - We will recheck labs (CBC/D, CMP, LDH) in 4 weeks and discuss further whether or not she would like to start treatment for her polycythemia vera.  2.  Social/family history: - Lives at home with her family.  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  She was never smoker. - No family history of leukemias or malignancies.  PLAN SUMMARY & DISPOSITION: Same-day labs and office visit in 4 weeks  All questions were answered. The patient knows to call the clinic with any problems, questions or concerns.  Medical decision making: Moderate  Time spent on visit: I spent 25 minutes counseling the patient face to face. The total time spent in the appointment was 40 minutes and more than 50% was on counseling.   Harriett Rush, PA-C  12/14/2021 1:01 PM

## 2021-12-14 ENCOUNTER — Inpatient Hospital Stay: Payer: Medicare HMO | Attending: Hematology | Admitting: Physician Assistant

## 2021-12-14 VITALS — BP 170/89 | HR 89 | Temp 97.5°F | Resp 18 | Ht 63.0 in | Wt 177.7 lb

## 2021-12-14 DIAGNOSIS — D75839 Thrombocytosis, unspecified: Secondary | ICD-10-CM | POA: Diagnosis not present

## 2021-12-14 DIAGNOSIS — D45 Polycythemia vera: Secondary | ICD-10-CM | POA: Insufficient documentation

## 2021-12-14 DIAGNOSIS — Z800 Family history of malignant neoplasm of digestive organs: Secondary | ICD-10-CM | POA: Diagnosis not present

## 2021-12-14 DIAGNOSIS — Z90710 Acquired absence of both cervix and uterus: Secondary | ICD-10-CM | POA: Insufficient documentation

## 2021-12-14 DIAGNOSIS — Z1589 Genetic susceptibility to other disease: Secondary | ICD-10-CM

## 2021-12-14 DIAGNOSIS — J00 Acute nasopharyngitis [common cold]: Secondary | ICD-10-CM | POA: Diagnosis not present

## 2021-12-14 DIAGNOSIS — R0982 Postnasal drip: Secondary | ICD-10-CM | POA: Diagnosis not present

## 2021-12-14 DIAGNOSIS — I10 Essential (primary) hypertension: Secondary | ICD-10-CM | POA: Diagnosis not present

## 2021-12-14 NOTE — Patient Instructions (Signed)
[name] [name] at [facility] **   You were seen today by Tarri Abernethy PA-C for your elevated red blood cells and elevated platelets.  This is due to a genetic mutation (called JAK2) that causes a blood condition called "polycythemia vera" where your bone marrow makes too many blood cells. Having too many blood cells places you at increased risk of blood clots, heart attack, and stroke (due to your blood being too thick). The best treatment for this is a medication called Hydrea (hydroxyurea) which decreases the amount of blood cells made by your bone marrow. It is also recommended that you take aspirin 81 mg daily to make your blood less sticky and decrease your risk of blood clots. I understand your hesitation about starting the new medications.  We will discuss this further at a follow-up visit in about 2 months.  FOLLOW-UP APPOINTMENT: Same-day labs and office visit in 4 weeks  ** Thank you for trusting me with your healthcare!  I strive to provide all of my patients with quality care at each visit.  If you receive a survey for this visit, I would be so grateful to you for taking the time to provide feedback.  Thank you in advance!  ~ Agostino Gorin                   Dr. Derek Jack   &   Tarri Abernethy, PA-C   - - - - - - - - - - - - - - - - - -    Thank you for choosing [place] [facility] to provide your oncology and hematology care.  To afford each patient quality time with our provider, please arrive at least 15 minutes before your scheduled appointment time.   If you have a lab appointment with the [facility] please come in thru the Main Entrance and check in at the main information desk.  You need to re-schedule your appointment should you arrive 10 or more minutes late.  We strive to give you quality time with our providers, and arriving late affects you and other patients whose  appointments are after yours.  Also, if you no show three or more times for appointments you may be dismissed from the clinic at the providers discretion.     Again, thank you for choosing [facility].  Our hope is that these requests will decrease the amount of time that you wait before being seen by our physicians.       _____________________________________________________________  Should you have questions after your visit to [facility], please contact our office at [phone] and follow the prompts.  Our office hours are 8:00 a.m. and 4:30 p.m. Monday - Friday.  Please note that voicemails left after 4:00 p.m. may not be returned until the following business day.  We are closed weekends and major holidays.  You do have access to a nurse 24-7, just call the main number to the clinic [phone] and do not press any options, hold on the line and a nurse will answer the phone.    For prescription refill requests, have your pharmacy contact our office and allow 72 hours.

## 2021-12-23 ENCOUNTER — Ambulatory Visit: Payer: Medicare HMO | Admitting: Cardiology

## 2022-01-13 NOTE — Progress Notes (Signed)
[name] [name], [place] [id number]   CLINIC:  Medical Oncology/Hematology  PCP:  [name], MD [place] [address] [phone]   REASON FOR VISIT:  Follow-up for erythrocytosis and thrombocytosis  PRIOR THERAPY: None  CURRENT THERAPY: Surveillance (patient refused treatment with Hydrea at last visit)  INTERVAL HISTORY:  [name] [name] 76 y.o. 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  She was last seen by Tarri Abernethy PA-C on 12/14/2021.  At today's visit, she reports feeling fairly well.  She denies any changes in her baseline health status since her visit with me last month.  *** She continues to deny any history of DVT, PE, MI, CVA.  *** No aquagenic pruritus or vasomotor symptoms.  *** She denies any B symptoms such as fever, night sweats, or weight loss.  *** She reports sinus infections 1-2 times a year, but no other infections.  She has ***% energy and ***% appetite. She endorses that she is maintaining a stable weight.    REVIEW OF SYSTEMS: *** Review of Systems  Constitutional:  Negative for appetite change, chills, diaphoresis, fatigue, fever and unexpected weight change.  HENT:   Negative for lump/mass and nosebleeds.   Eyes:  Negative for eye problems.  Respiratory:  Negative for cough, hemoptysis and shortness of breath.   Cardiovascular:  Negative for chest pain, leg swelling and palpitations.  Gastrointestinal:  Negative for abdominal pain, blood in stool, constipation, diarrhea, nausea and vomiting.  Genitourinary:  Positive for difficulty urinating. Negative for hematuria.   Skin: Negative.   Neurological:  Positive for headaches. Negative for dizziness and light-headedness.  Hematological:  Does not bruise/bleed easily.      PAST MEDICAL/SURGICAL HISTORY:  Past Medical History:  Diagnosis Date   Anxiety    Dysphagia 09/03/2020   Essential  hypertension    Hypothyroidism    NSTEMI (non-ST elevated myocardial infarction) ([place]) 2015   a.  Related to occlusion of the distal Cx. The mid to distal OM1 also contains a significant stenosis that is best treated with medical therapy. Patent RCA/LAD with tortuosity. LM patent. EF 55%.    Sleep apnea    Past Surgical History:  Procedure Laterality Date   ABDOMINAL HYSTERECTOMY     CARDIAC CATHETERIZATION  ~ 2000   CORONARY ANGIOPLASTY  09/20/2013   LEFT HEART CATHETERIZATION WITH CORONARY ANGIOGRAM N/A 09/20/2013   Procedure: LEFT HEART CATHETERIZATION WITH CORONARY ANGIOGRAM;  Surgeon: Sinclair Grooms, MD;  Location: [facility] CATH LAB;  Service: Cardiovascular;  Laterality: N/A;   LEFT HEART CATHETERIZATION WITH CORONARY ANGIOGRAM N/A 09/27/2013   Procedure: LEFT HEART CATHETERIZATION WITH CORONARY ANGIOGRAM;  Surgeon: Blane Ohara, MD;  Location: [facility] CATH LAB;  Service: Cardiovascular;  Laterality: N/A;   THYROIDECTOMY  10/08/2011   Procedure: THYROIDECTOMY;  Surgeon: [name], MD;  Location: [facility] ORS;  Service: General;  Laterality: N/A;  Total   VAGINAL HYSTERECTOMY  1993     SOCIAL HISTORY:  Social History   Socioeconomic History   Marital status: Widowed    Spouse name: Not on file   Number of children: Not on file   Years of education: Not on file   Highest education level: Not on file  Occupational History   Not on file  Tobacco Use   Smoking status: Never   Smokeless tobacco: Never  Vaping Use   Vaping Use: Never used  Substance and Sexual Activity  Alcohol use: No    Alcohol/week: 0.0 standard drinks of alcohol   Drug use: No   Sexual activity: Never  Other Topics Concern   Not on file  Social History Narrative   ** Merged History Encounter **  Lives at home with family.  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      Social Determinants of Health   Financial Resource Strain: Low Risk  (07/21/2020)   Overall Financial Resource Strain (CARDIA)    Difficulty of Paying  Living Expenses: Not very hard  Food Insecurity: No Food Insecurity (07/21/2020)   Hunger Vital Sign    Worried About Running Out of Food in the Last Year: Never true    Ran Out of Food in the Last Year: Never true  Transportation Needs: No Transportation Needs (09/08/2020)   PRAPARE - Hydrologist (Medical): No    Lack of Transportation (Non-Medical): No  Physical Activity: Sufficiently Active (03/11/2020)   Exercise Vital Sign    Days of Exercise per Week: 5 days    Minutes of Exercise per Session: 30 min  Stress: No Stress Concern Present (03/11/2020)   [place]    Feeling of Stress : Not at all  Social Connections: Not on file  Intimate Partner Violence: Not on file    FAMILY HISTORY:  Family History  Problem Relation Age of Onset   Colon cancer Father    Thyroid disease Neg Hx     CURRENT MEDICATIONS:  Outpatient Encounter Medications as of 01/14/2022  Medication Sig   aspirin EC 81 MG tablet Take 1 tablet (81 mg total) by mouth daily with breakfast.   carvedilol (COREG) 25 MG tablet Take 1 tablet (25 mg total) by mouth 2 (two) times daily.   chlorthalidone (HYGROTON) 25 MG tablet Take 0.5 tablets (12.5 mg total) by mouth daily.   cloNIDine (CATAPRES - DOSED IN MG/24 HR) 0.2 mg/24hr patch Place 1 patch (0.2 mg total) onto the skin once a week.   diclofenac Sodium (VOLTAREN) 1 % GEL Apply 2 g topically 4 (four) times daily as needed.   gabapentin (NEURONTIN) 300 MG capsule Take 300 mg by mouth 3 (three) times daily.   levothyroxine (SYNTHROID) 125 MCG tablet Take 1 tablet (125 mcg total) by mouth daily before breakfast. 1 HOUR PRIOR TO OTHER MEDICATIONS   spironolactone (ALDACTONE) 25 MG tablet Take 1 tablet (25 mg total) by mouth daily.   No facility-administered encounter medications on file as of 01/14/2022.    ALLERGIES:  Allergies  Allergen Reactions   Fluviral [Influenza Virus  Vaccine] Shortness Of Breath, Swelling and Other (See Comments)    Including back pain that radiates in upper extremeties   Haemophilus Influenzae Other (See Comments), Shortness Of Breath and Swelling    Including back pain that radiates in upper extremeties   Amlodipine     Lip swelling per patient.    Lisinopril     Dr. Legrand Rams stopped due to side effects per patient.  Could remember what side effects were.     Ranexa [Ranolazine]     Chest pain     PHYSICAL EXAM:*** ECOG PERFORMANCE STATUS: 0 - Asymptomatic  There were no vitals filed for this visit. There were no vitals filed for this visit. Physical Exam Constitutional:      Appearance: Normal appearance. She is obese.  HENT:     Head: Normocephalic and atraumatic.     Mouth/Throat:  Mouth: Mucous membranes are moist.  Eyes:     Extraocular Movements: Extraocular movements intact.     Pupils: Pupils are equal, round, and reactive to light.  Cardiovascular:     Rate and Rhythm: Normal rate and regular rhythm.     Pulses: Normal pulses.     Heart sounds: Normal heart sounds.  Pulmonary:     Effort: Pulmonary effort is normal.     Breath sounds: Normal breath sounds.  Abdominal:     General: Bowel sounds are normal.     Palpations: Abdomen is soft.     Tenderness: There is no abdominal tenderness.  Musculoskeletal:        General: No swelling.     Right lower leg: No edema.     Left lower leg: No edema.  Lymphadenopathy:     Cervical: No cervical adenopathy.  Skin:    General: Skin is warm and dry.  Neurological:     General: No focal deficit present.     Mental Status: She is alert and oriented to person, place, and time.  Psychiatric:        Mood and Affect: Mood normal.        Behavior: Behavior normal.      LABORATORY DATA:  I have reviewed the labs as listed.  CBC    Component Value Date/Time   WBC 6.5 11/20/2021 1118   RBC 5.64 (H) 11/20/2021 1118   HGB 16.9 (H) 11/20/2021 1118   HCT 51.1 (H)  11/20/2021 1118   PLT 613 (H) 11/20/2021 1118   MCV 90.6 11/20/2021 1118   MCH 30.0 11/20/2021 1118   MCHC 33.1 11/20/2021 1118   RDW 15.7 (H) 11/20/2021 1118   LYMPHSABS 1.5 11/20/2021 1118   MONOABS 0.3 11/20/2021 1118   EOSABS 0.3 11/20/2021 1118   BASOSABS 0.0 11/20/2021 1118      Latest Ref Rng & Units 02/07/2021    4:50 AM 09/10/2020   11:16 AM 07/28/2020    9:17 AM  CMP  Glucose 70 - 99 mg/dL 97  87  119   BUN 8 - 23 mg/dL _0 Creatinine 0.44 - 1.00 mg/dL 1.34  1.16  1.04   Sodium 135 - 145 mmol/L 137  143  140   Potassium 3.5 - 5.1 mmol/L 3.9  4.5  4.6   Chloride 98 - 111 mmol/L 104  100  102   CO2 22 - 32 mmol/L _1 Calcium 8.9 - 10.3 mg/dL 9.2  9.7  9.9     DIAGNOSTIC IMAGING:  I have independently reviewed the relevant imaging and discussed with the patient.  ASSESSMENT & PLAN: 1.  JAK2 positive polycythemia vera - Seen at request of Dr. Theador Hawthorne for elevated hemoglobin and platelet count - Review of EMR shows platelet count elevated since August 2015, ranging between 425-613 - Hemoglobin elevated since January 2019, ranging between 15.5-17.2 - No aquagenic pruritus or vasomotor symptoms.  No B symptoms.*** - No history of DVT, PE, MI, or CVA.*** - She is a lifelong non-smoker and denies any chemical exposure or known history of sleep apnea. - Hematology work-up (11/20/2021): JAK2 [id number] positive Low erythropoietin 1.8, in keeping with polycythemia vera BCR ABL FISH negative.  ESR and CRP negative. - Reviewed CBC (11/20/2021): Hgb 16.9/HCT 51.1, platelets 613 - Labs today (01/14/2022): *** - PLAN: Extensive discussion with patient regarding diagnosis of polycythemia vera, including risks of thrombosis as well as risk  of myelofibrosis and leukemia in some patients.*** - Based on her age, she is considered high risk and therefore recommend cytoreductive therapy with hydroxyurea.  We discussed potential side effects of hydroxyurea.*** - Patient is  extremely hesitant to start any medications, reports that she would like to do her own research first since the medical field has been so heavily influenced by the "[employer]."  We did discuss the risks of NOT receiving treatment for her polycythemia vera, and she verbalizes understanding and acceptance of these risks for the time being.*** - We did also discuss that if she does not want to be on Hydrea for her elevated Hgb/platelets, we could at least treat her elevated hemoglobin with intermittent therapeutic phlebotomy, although this would have no effect on her platelets.*** - She is also hesitant to start aspirin 81 mg daily due to concern that it would "thin her blood too much."  Education was provided to reinforce that antiplatelet agent such as aspirin would decrease the risk of thrombosis in the case of her MPN.  She reports that she will think about this and decide at a later time.*** - We will recheck labs (CBC/D, CMP, LDH) in 4 weeks and discuss further whether or not she would like to start treatment for her polycythemia vera.***  2.  Social/family history: - Lives at home with her family.  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  She was never smoker. - No family history of leukemias or malignancies.   PLAN SUMMARY: >> *** >> *** >> ***   All questions were answered. The patient knows to call the clinic with any problems, questions or concerns.  Medical decision making:***  Time spent on visit: I spent *** minutes counseling the patient face to face. The total time spent in the appointment was *** minutes and more than 50% was on counseling.   Harriett Rush, PA-C  ***

## 2022-01-14 ENCOUNTER — Inpatient Hospital Stay: Payer: Medicare HMO | Admitting: Hematology

## 2022-01-14 ENCOUNTER — Inpatient Hospital Stay: Payer: Medicare HMO | Attending: Hematology | Admitting: Physician Assistant

## 2022-01-14 VITALS — BP 176/107 | HR 72 | Temp 97.7°F | Resp 16 | Wt 173.0 lb

## 2022-01-14 DIAGNOSIS — R519 Headache, unspecified: Secondary | ICD-10-CM | POA: Diagnosis not present

## 2022-01-14 DIAGNOSIS — I10 Essential (primary) hypertension: Secondary | ICD-10-CM | POA: Insufficient documentation

## 2022-01-14 DIAGNOSIS — D75839 Thrombocytosis, unspecified: Secondary | ICD-10-CM | POA: Insufficient documentation

## 2022-01-14 DIAGNOSIS — Z1589 Genetic susceptibility to other disease: Secondary | ICD-10-CM

## 2022-01-14 DIAGNOSIS — Z800 Family history of malignant neoplasm of digestive organs: Secondary | ICD-10-CM | POA: Diagnosis not present

## 2022-01-14 DIAGNOSIS — D45 Polycythemia vera: Secondary | ICD-10-CM

## 2022-01-14 DIAGNOSIS — Z90710 Acquired absence of both cervix and uterus: Secondary | ICD-10-CM | POA: Diagnosis not present

## 2022-01-14 LAB — CBC WITH DIFFERENTIAL/PLATELET
Abs Immature Granulocytes: 0.06 10*3/uL (ref 0.00–0.07)
Basophils Absolute: 0.1 10*3/uL (ref 0.0–0.1)
Basophils Relative: 1 %
Eosinophils Absolute: 0.3 10*3/uL (ref 0.0–0.5)
Eosinophils Relative: 4 %
HCT: 54.2 % — ABNORMAL HIGH (ref 36.0–46.0)
Hemoglobin: 17.4 g/dL — ABNORMAL HIGH (ref 12.0–15.0)
Immature Granulocytes: 1 %
Lymphocytes Relative: 24 %
Lymphs Abs: 1.8 10*3/uL (ref 0.7–4.0)
MCH: 28.9 pg (ref 26.0–34.0)
MCHC: 32.1 g/dL (ref 30.0–36.0)
MCV: 89.9 fL (ref 80.0–100.0)
Monocytes Absolute: 0.4 10*3/uL (ref 0.1–1.0)
Monocytes Relative: 5 %
Neutro Abs: 5 10*3/uL (ref 1.7–7.7)
Neutrophils Relative %: 65 %
Platelets: 650 10*3/uL — ABNORMAL HIGH (ref 150–400)
RBC: 6.03 MIL/uL — ABNORMAL HIGH (ref 3.87–5.11)
RDW: 17.7 % — ABNORMAL HIGH (ref 11.5–15.5)
WBC: 7.7 10*3/uL (ref 4.0–10.5)
nRBC: 0 % (ref 0.0–0.2)

## 2022-01-14 LAB — LACTATE DEHYDROGENASE: LDH: 318 U/L — ABNORMAL HIGH (ref 98–192)

## 2022-01-14 LAB — COMPREHENSIVE METABOLIC PANEL
ALT: 29 U/L (ref 0–44)
AST: 31 U/L (ref 15–41)
Albumin: 4.1 g/dL (ref 3.5–5.0)
Alkaline Phosphatase: 50 U/L (ref 38–126)
Anion gap: 9 (ref 5–15)
BUN: 19 mg/dL (ref 8–23)
CO2: 26 mmol/L (ref 22–32)
Calcium: 9.3 mg/dL (ref 8.9–10.3)
Chloride: 100 mmol/L (ref 98–111)
Creatinine, Ser: 1.48 mg/dL — ABNORMAL HIGH (ref 0.44–1.00)
GFR, Estimated: 37 mL/min — ABNORMAL LOW (ref >60)
Glucose, Bld: 91 mg/dL (ref 70–99)
Potassium: 4.2 mmol/L (ref 3.5–5.1)
Sodium: 135 mmol/L (ref 135–145)
Total Bilirubin: 0.7 mg/dL (ref 0.3–1.2)
Total Protein: 8 g/dL (ref 6.5–8.1)

## 2022-01-14 LAB — URINALYSIS, DIPSTICK ONLY
Bilirubin Urine: NEGATIVE
Glucose, UA: NEGATIVE mg/dL
Hgb urine dipstick: NEGATIVE
Ketones, ur: NEGATIVE mg/dL
Leukocytes,Ua: NEGATIVE
Nitrite: NEGATIVE
Protein, ur: NEGATIVE mg/dL
Specific Gravity, Urine: 1.005 (ref 1.005–1.030)
pH: 6 (ref 5.0–8.0)

## 2022-01-14 MED ORDER — HYDROXYUREA 500 MG PO CAPS: 500.0000 mg | ORAL_CAPSULE | Freq: Every day | ORAL | 3 refills | Status: DC

## 2022-01-14 MED ORDER — ASPIRIN 81 MG PO TBEC: 81.0000 mg | DELAYED_RELEASE_TABLET | Freq: Every day | ORAL | 12 refills | Status: DC

## 2022-01-14 NOTE — Patient Instructions (Addendum)
[facility] **   You were seen today by Tarri Abernethy PA-C for your elevated red blood cells and elevated platelets.  This is due to a genetic mutation (called JAK2) that causes a blood condition called "polycythemia vera" where your bone marrow makes too many blood cells. Having too many blood cells places you at increased risk of blood clots, heart attack, and stroke (due to your blood being too thick). The best treatment for this is a medication called Hydrea (hydroxyurea) which decreases the amount of blood cells made by your bone marrow. It is also recommended that you take aspirin 81 mg daily to make your blood less sticky and decrease your risk of blood clots. Prescription has been sent to your pharmacy to start taking Hydrea 500 mg once a day and aspirin 81 mg once a day. We will check your blood counts in 2 weeks. We will check blood counts AND see you for an office visit in 4 weeks.  ELEVATED BLOOD PRESSURE: Please reach out to your cardiologist and/or nephrologist for adjustment of your blood pressure medications.  If you continue to have elevated blood pressure and severe headache, I recommend evaluation in the emergency department.  RASH ON BACK: This may or may not represent shingles or other skin rash.  Recommend seeking care at urgent care department since he did not yet have primary care provider.  PRIMARY CARE PROVIDER: Please reach out to [facility] [phone]) located on [address], [place], [place] [address], as they have primary care doctors who are accepting new patients.  ** Thank you for trusting me with your healthcare!  I strive to provide all of my patients with quality care at each visit.  If you receive a survey for this visit, I would be so grateful to you for taking the time to provide feedback.  Thank you in advance!  ~ Alp [place]                   Dr. Derek Jack   &   Tarri Abernethy, PA-C   - - - - - - - - - - - - - - - - - -    Thank you for choosing [facility] to provide your oncology and hematology care.  To afford each patient quality time with our provider, please arrive at least 15 minutes before your scheduled appointment time.   If you have a lab appointment with the [facility] please come in thru the Main Entrance and check in at the main information desk.  You need to re-schedule your appointment should you arrive 10 or more minutes late.  We strive to give you quality time with our providers, and arriving late affects you and other patients whose appointments are after yours.  Also, if you no show three or more times for appointments you may be dismissed from the clinic at the providers discretion.     Again, thank you for choosing [facility].  Our hope is that these requests will decrease the amount of time that you wait before being seen by our physicians.       _____________________________________________________________  Should you have questions after your visit to [facility], please contact our office at [phone] and follow the prompts.  Our office hours are 8:00 a.m. and 4:30 p.m. Monday - Friday.  Please  note that voicemails left after 4:00 p.m. may not be returned until the following business day.  We are closed weekends and major holidays.  You do have access to a nurse 24-7, just call the main number to the clinic [phone] and do not press any options, hold on the line and a nurse will answer the phone.    For prescription refill requests, have your pharmacy contact our office and allow 72 hours.

## 2022-01-25 ENCOUNTER — Encounter: Payer: Self-pay | Admitting: *Deleted

## 2022-01-25 NOTE — Progress Notes (Unsigned)
Cardiology Office Note:    Date:  01/26/2022  ID:  [name], [name] Nov 08, 1945, MRN [id number]  PCP:  [name], [place] [facility] Cardiologist:  [name], MD     Referring MD: [name]*   CC: Here for follow-up  History of Present Illness:    [name] [name] is a 76 y.o. female with a hx of the following:  CAD, s/p NSTEMI HTN Daytime fatigue/sleepiness Hypothyroidism  Multinodular goiter Statin intolreance HLD Stage IIIb chronic kidney disease (sees kidney doctor)   Patient is a very pleasant 76 year old female with past medical history as mentioned above.  Last seen by Dr. [name] on August 10, 2021 and was overall doing well from a cardiac perspective.  Since her last office visit with him, she noted that she had sinus infection, interval shingles and UTI.  Noted fluctuations in her blood pressure in office that day. No medication changes were made.  Was told to follow-up in 6 months.  Today she presents for follow-up. She states she has been diagnosed with polycythemia vera, she is following Dr. [name]. She is receiving PO chemo.  Tolerating pretty well.  Denies any chest pain, but feels a fullness along her upper neck/chest that she thinks is related to her medication, denies any exertional chest pain or other associated symptoms.  She is here because she has been having blood pressure medication issues and has been recommended to follow-up with cardiology.  Blood pressure at home tends to remain elevated, and she says she is not a fan of taking medications.  She has brought in her medications to be checked today.  Denies any shortness of breath, palpitations, syncope, presyncope, dizziness, orthopnea, PND, swelling, significant weight changes, acute bleeding, or claudication.  STOP-BANG score today 3.  In her free time, she enjoys going to yard sales.  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  Denies any  other questions or concerns today.  Past Medical History:  Diagnosis Date   Anxiety    Dysphagia 09/03/2020   Essential hypertension    HLD (hyperlipidemia)    Hx of statin intolerance   Hypothyroidism    NSTEMI (non-ST elevated myocardial infarction) ([place]) 2015   a.  Related to occlusion of the distal Cx. The mid to distal OM1 also contains a significant stenosis that is best treated with medical therapy. Patent RCA/LAD with tortuosity. LM patent. EF 55%.    Polycythemia vera ([place])    Stage 3b chronic kidney disease     Past Surgical History:  Procedure Laterality Date   ABDOMINAL HYSTERECTOMY     CARDIAC CATHETERIZATION  ~ 2000   CORONARY ANGIOPLASTY  09/20/2013   LEFT HEART CATHETERIZATION WITH CORONARY ANGIOGRAM N/A 09/20/2013   Procedure: LEFT HEART CATHETERIZATION WITH CORONARY ANGIOGRAM;  Surgeon: Sinclair Grooms, MD;  Location: [facility] C[facility] LAB;  Service: Cardiovascular;  Laterality: N/A;   LEFT HEART CATHETERIZATION WITH CORONARY ANGIOGRAM N/A 09/27/2013   Procedure: LEFT HEART CATHETERIZATION WITH CORONARY ANGIOGRAM;  Surgeon: Blane Ohara, MD;  Location: [facility] CATH LAB;  Service: Cardiovascular;  Laterality: N/A;   THYROIDECTOMY  10/08/2011   Procedure: THYROIDECTOMY;  Surgeon: [name], MD;  Location: [facility] ORS;  Service: General;  Laterality: N/A;  Total   VAGINAL HYSTERECTOMY  1993    Current Medications: Current Meds  Medication Sig   aspirin EC 81 MG tablet Take 1 tablet (81 mg total) by mouth daily. Swallow whole.   carvedilol (  COREG) 25 MG tablet Take 1 tablet (25 mg total) by mouth 2 (two) times daily.   chlorthalidone (HYGROTON) 25 MG tablet Take 0.5 tablets (12.5 mg total) by mouth daily.   hydroxyurea (HYDREA) 500 MG capsule Take 1 capsule (500 mg total) by mouth daily. May take with food to minimize GI side effects.   levothyroxine (SYNTHROID) 125 MCG tablet Take 1 tablet (125 mcg total) by mouth daily before breakfast. 1 HOUR PRIOR TO OTHER MEDICATIONS    minoxidil (LONITEN) 10 MG tablet Take 5 mg by mouth daily.   spironolactone (ALDACTONE) 25 MG tablet Take 1 tablet (25 mg total) by mouth daily.     Allergies:   Fluviral [influenza virus vaccine], Haemophilus influenzae, Amlodipine, Lisinopril, and Ranexa [ranolazine]   Social History   Socioeconomic History   Marital status: Widowed    Spouse name: Not on file   Number of children: Not on file   Years of education: Not on file   Highest education level: Not on file  Occupational History   Not on file  Tobacco Use   Smoking status: Never   Smokeless tobacco: Never  Vaping Use   Vaping Use: Never used  Substance and Sexual Activity   Alcohol use: No    Alcohol/week: 0.0 standard drinks of alcohol   Drug use: No   Sexual activity: Never  Other Topics Concern   Not on file  Social History Narrative   ** Merged History Encounter **  Lives at home with family.  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      Social Determinants of Health   Financial Resource Strain: Low Risk  (07/21/2020)   Overall Financial Resource Strain (CARDIA)    Difficulty of Paying Living Expenses: Not very hard  Food Insecurity: No Food Insecurity (07/21/2020)   Hunger Vital Sign    Worried About Running Out of Food in the Last Year: Never true    Ran Out of Food in the Last Year: Never true  Transportation Needs: No Transportation Needs (09/08/2020)   PRAPARE - Hydrologist (Medical): No    Lack of Transportation (Non-Medical): No  Physical Activity: Sufficiently Active (03/11/2020)   Exercise Vital Sign    Days of Exercise per Week: 5 days    Minutes of Exercise per Session: 30 min  Stress: No Stress Concern Present (03/11/2020)   [place]    Feeling of Stress : Not at all  Social Connections: Not on file     Family History: The patient's family history includes Colon cancer in her father. There is no history of  Thyroid disease.  ROS:   Review of Systems  Constitutional: Negative.   HENT: Negative.    Eyes: Negative.   Respiratory: Negative.    Cardiovascular:  Negative for chest pain, palpitations, orthopnea, claudication, leg swelling and PND.       See HPI.   Gastrointestinal: Negative.        See HPI.   Genitourinary: Negative.   Musculoskeletal: Negative.   Skin: Negative.   Neurological: Negative.   Endo/Heme/Allergies: Negative.   Psychiatric/Behavioral: Negative.      Please see the history of present illness.    All other systems reviewed and are negative.  EKGs/Labs/Other Studies Reviewed:    The following studies were reviewed today:   EKG:  EKG is ordered today.  The ekg ordered today demonstrates normal sinus rhythm, 66 bpm, old MI, with T  wave abnormality, seen on previous EKG.  Addendum 01/27/22: Consulted Dr. [name] who reviewed EKG and stated pt has poor R wave progression but not necessarily diagnostic of anterior infarct.   Bilateral renal artery ultrasound on April 24, 2020: Right: Normal size right kidney. Normal cortical thickness of right         kidney. Normal right Resisitive Index. No evidence of right         renal artery stenosis. RRV flow present.  Left:  Normal size of left kidney. Normal left Resistive Index.         Normal cortical thickness of the left kidney. No evidence of         left renal artery stenosis. LRV flow present.  Mesenteric:  Normal Celiac artery and Superior Mesenteric artery findings.  IVC is patent.  Myoview on September 24, 2015: Blood pressure demonstrated a hypertensive response to exercise. There was no ST segment deviation noted during stress. The study is normal. This is a low risk study. The left ventricular ejection fraction is normal (55-65%). Duke treadmill score of 7 consistent with low risk for major cardiac events.    2D echocardiogram on September 21, 2013: Left ventricle: The cavity size was normal. There was  mild    concentric hypertrophy. Systolic function was normal. The    estimated ejection fraction was in the range of 55% to 60%. Wall    motion was normal; there were no regional wall motion    abnormalities. Doppler parameters are consistent with abnormal    left ventricular relaxation (grade 1 diastolic dysfunction).  - Left atrium: The atrium was mildly dilated.  - Pulmonary arteries: Systolic pressure was mildly increased. PA    peak pressure: 37 mm Hg (S).  - Pericardium, extracardiac: A small pericardial effusion was    identified along the right ventricular free wall.   Transthoracic echocardiography.  M-mode, complete 2D, spectral  Doppler, and color Doppler.  Birthdate:  Patient birthdate:  October 25, 1945.  Age:  Patient is 76 yr old.  Sex:  Gender: female.  BMI: 37 kg/m^2.  Blood pressure:     109/55  Patient status:  Inpatient.  Study date:  Study date: 09/21/2013. Study time: 09:24  AM.  Location:  Echo laboratory.    Recent Labs: 01/14/2022: ALT 29; BUN 19; Creatinine, Ser 1.48; Hemoglobin 17.4; Platelets 650; Potassium 4.2; Sodium 135  Recent Lipid Panel    Component Value Date/Time   CHOL 155 11/27/2020 1012   TRIG 65 11/27/2020 1012   HDL 50 11/27/2020 1012   CHOLHDL 3.1 11/27/2020 1012   CHOLHDL 3.6 09/04/2019 1216   VLDL 13 09/04/2019 1216   LDLCALC 92 11/27/2020 1012     Risk Assessment/Calculations:      HYPERTENSION CONTROL Vitals:   01/26/22 1110 01/26/22 1125  BP: (!) 150/100 (!) 140/99    The patient's blood pressure is elevated above target today.  In order to address the patient's elevated BP: Blood pressure will be monitored at home to determine if medication changes need to be made.; Follow up with general cardiology has been recommended.; A referral to the [facility] will be placed.       STOP-Bang Score:  3  {    Physical Exam:    VS:  BP (!) 140/99 (BP Location: Right Arm, Patient Position: Sitting, Cuff Size: Normal)    Pulse 66   Ht _0  (1.6 m)   Wt 174 lb 9.6 oz (79.2 kg)   SpO2  95%   BMI 30.93 kg/m     Wt Readings from Last 3 Encounters:  01/26/22 174 lb 9.6 oz (79.2 kg)  01/14/22 173 lb (78.5 kg)  12/14/21 177 lb 11.2 oz (80.6 kg)     GEN: Obese, 76 y.o. female in no acute distress HEENT: Normal NECK: No JVD; No carotid bruits CARDIAC: S1/S2, RRR, no murmurs, rubs, gallops; 2+ peripheral pulses throughout, strong equal bilaterally RESPIRATORY:  Clear to auscultation without rales, wheezing or rhonchi  MUSCULOSKELETAL:  No edema; No deformity  SKIN: Warm and dry NEUROLOGIC:  Alert and oriented x 3 PSYCHIATRIC:  Normal affect   ASSESSMENT:    1. Coronary artery disease involving native coronary artery of native heart with angina pectoris ([place])   2. Essential hypertension   3. Resistant hypertension   4. Daytime sleepiness   5. Snoring   6. Hypothyroidism, unspecified type   7. Fatigue, unspecified type   8. Mixed hyperlipidemia   9. Statin intolerance   10. Medication management   11. Stage 3b chronic kidney disease (HCC)    PLAN:    In order of problems listed above:  CAD, s/p NSTEMI Denies any exertional chest pain, however does admit to upper neck fullness. Denies similar symptoms from past NSTEMI. EKG today shows normal sinus rhythm, 66 bpm, old MI, with T wave abnormality, seen on previous EKG. Normal Lexiscan in 2017. If symptoms do not improve by next follow-up, plan to discuss ischemic evaluation. Continue ASA and carvedilol. Will order 2D echocardiogram to evaluate for [facility] and obtain FLP as mentioned below, due for labs to be checked. Heart healthy diet and regular cardiovascular exercise encouraged. ED precautions discussed.   Addendum 01/27/22: Dr. [name] consulted regarding EKG and reviewed her previous EKG's. He stated she does "have poor R wave progression but not necessarily diagnostic of anterior infarct. Would be reasonable to get an echocardiogram and if she  does not have an anterior wall motion abnormality this would be further argument against anterior infarct scar. Not unreasonable to do ischemic workup if symptoms are concerning however, she did have a low risk Myoview in 2017."  Will arrange 2D echo as mentioned above.   HTN, resistant hypertension Initial BP on arrival 150/100, repeat BP 140/99. Similar BP readings at home. Unable to tolerate lisinopril and amlodipine. No evidence of renal artery stenosis. Discussed to monitor BP at home at least 2 hours after medications and sitting for 5-10 minutes. BP log given today and recommended to obtain Omron cuff. She will monitor and log her BP and bring back to OV. Continue Carvedilol and aldactone. Refer to Advanced HTN clinic. Will r/o secondary htn causes including sleep apnea (Will set up split night sleep study) and check thyroid panel. Heart healthy diet and regular cardiovascular exercise encouraged.   3. Daytime sleepiness, hx of snoring STOP-Bang score 3 today. Says she has not had sleep study performed before. Will arrange split night sleep study. Heart healthy diet and regular cardiovascular exercise encouraged.   Hypothyroidism, fatigue TSH 1 year ago was 0.497. She is due for repeat labs. Will check thyroid panel as mentioned above. Continue Synthroid. Heart healthy diet and regular cardiovascular exercise encouraged.   HLD, statin intolerance Labs from 1 year ago revealed total cholesterol 159, HDL 45, LDL 98, triglycerides 84.  She is due for labs to be rechecked.  Will obtain fasting lipid panel as mentioned above.  She has a history of statin intolerance.  Depending on lab results, will discuss/consider lipid  clinic referral if needed at next follow-up visit. Heart healthy diet and regular cardiovascular exercise encouraged.   Stage IIIb chronic kidney disease (sees kidney doctor) Most recent serum creatinine 1.48 with eGFR at 37.  She currently follows a kidney doctor.  Avoid  nephrotoxic agents.  Encouraged to stay adequately well-hydrated.    7. Disposition: Follow up with advanced hypertension clinic in 1 month and follow-up with me in 2 months or sooner if anything changes.  Reitred from social services. [employer].  Medication Adjustments/Labs and Tests Ordered: Current medicines are reviewed at length with the patient today.  Concerns regarding medicines are outlined above.  Orders Placed This Encounter  Procedures   T3, free   T4, free   TSH   Lipid panel   AMB REFERRAL TO ADVANCED HTN CLINIC   EKG 12-Lead   Split night study   No orders of the defined types were placed in this encounter.   Patient Instructions  Medication Instructions:  Your physician recommends that you continue on your current medications as directed. Please refer to the Current Medication list given to you today.  Labwork: Your physician recommends that you return for a FASTING lipid profile, TSH, T3 & T4. Please do not eat or drink for at least 8 hours when you have this done. You may take your medications that morning with a sip of water. [employer] ([place] [facility])  Testing/Procedures: Your physician has recommended that you have a sleep study. This test records several body functions during sleep, including: brain activity, eye movement, oxygen and carbon dioxide blood levels, heart rate and rhythm, breathing rate and rhythm, the flow of air through your mouth and nose, snoring, body muscle movements, and chest and belly movement.  Follow-Up: Your physician recommends that you schedule a follow-up appointment in: 2 months  Any Other Special Instructions Will Be Listed Below (If Applicable). You have been referred to [facility] Your physician has requested that you regularly monitor and record your blood pressure readings at home. Please use the same machine at the same time of day to check your readings and record them to  bring to your follow-up visit. [place] information given  If you need a refill on your cardiac medications before your next appointment, please call your pharmacy.   Signed, Finis Bud, NP  01/27/2022 1:37 PM    [place]

## 2022-01-25 NOTE — Progress Notes (Addendum)
[place] form completed and faxed to [facility] along with labs @ [phone], per her request.

## 2022-01-26 ENCOUNTER — Ambulatory Visit: Payer: Medicare HMO | Attending: Cardiology | Admitting: Nurse Practitioner

## 2022-01-26 ENCOUNTER — Encounter: Payer: Self-pay | Admitting: Nurse Practitioner

## 2022-01-26 VITALS — BP 140/99 | HR 66 | Ht 63.0 in | Wt 174.6 lb

## 2022-01-26 DIAGNOSIS — Z79899 Other long term (current) drug therapy: Secondary | ICD-10-CM | POA: Diagnosis not present

## 2022-01-26 DIAGNOSIS — Z789 Other specified health status: Secondary | ICD-10-CM | POA: Diagnosis not present

## 2022-01-26 DIAGNOSIS — I25119 Atherosclerotic heart disease of native coronary artery with unspecified angina pectoris: Secondary | ICD-10-CM

## 2022-01-26 DIAGNOSIS — R5383 Other fatigue: Secondary | ICD-10-CM | POA: Diagnosis not present

## 2022-01-26 DIAGNOSIS — N1832 Chronic kidney disease, stage 3b: Secondary | ICD-10-CM | POA: Diagnosis not present

## 2022-01-26 DIAGNOSIS — E039 Hypothyroidism, unspecified: Secondary | ICD-10-CM | POA: Diagnosis not present

## 2022-01-26 DIAGNOSIS — I10 Essential (primary) hypertension: Secondary | ICD-10-CM | POA: Diagnosis not present

## 2022-01-26 DIAGNOSIS — R400 Somnolence: Secondary | ICD-10-CM

## 2022-01-26 DIAGNOSIS — I1A0 Resistant hypertension: Secondary | ICD-10-CM | POA: Diagnosis not present

## 2022-01-26 DIAGNOSIS — R0683 Snoring: Secondary | ICD-10-CM

## 2022-01-26 DIAGNOSIS — E782 Mixed hyperlipidemia: Secondary | ICD-10-CM | POA: Diagnosis not present

## 2022-01-26 NOTE — Patient Instructions (Addendum)
Medication Instructions:  Your physician recommends that you continue on your current medications as directed. Please refer to the Current Medication list given to you today.  Labwork: Your physician recommends that you return for a FASTING lipid profile, TSH, T3 & T4. Please do not eat or drink for at least 8 hours when you have this done. You may take your medications that morning with a sip of water. [employer] ([place] [facility])  Testing/Procedures: Your physician has recommended that you have a sleep study. This test records several body functions during sleep, including: brain activity, eye movement, oxygen and carbon dioxide blood levels, heart rate and rhythm, breathing rate and rhythm, the flow of air through your mouth and nose, snoring, body muscle movements, and chest and belly movement.  Follow-Up: Your physician recommends that you schedule a follow-up appointment in: 2 months  Any Other Special Instructions Will Be Listed Below (If Applicable). You have been referred to [facility] Your physician has requested that you regularly monitor and record your blood pressure readings at home. Please use the same machine at the same time of day to check your readings and record them to bring to your follow-up visit. [place] information given  If you need a refill on your cardiac medications before your next appointment, please call your pharmacy.

## 2022-01-27 ENCOUNTER — Other Ambulatory Visit: Payer: Self-pay | Admitting: *Deleted

## 2022-01-27 ENCOUNTER — Ambulatory Visit: Payer: Medicare HMO | Admitting: Cardiology

## 2022-01-27 ENCOUNTER — Other Ambulatory Visit: Payer: Self-pay

## 2022-01-27 ENCOUNTER — Encounter: Payer: Self-pay | Admitting: Nurse Practitioner

## 2022-01-27 DIAGNOSIS — D45 Polycythemia vera: Secondary | ICD-10-CM

## 2022-01-27 DIAGNOSIS — I25119 Atherosclerotic heart disease of native coronary artery with unspecified angina pectoris: Secondary | ICD-10-CM

## 2022-01-27 NOTE — Progress Notes (Signed)
Per Benjamine Mola Peck-I have consulted Dr. [name] regarding this patient. Can we please arrange a 2D Echo to be arranged? Did admit to atypical chest pain yesterday and want to rule out wall motion abnormalities on her echo, want to see how the walls of her heart are functioning based on her EKG.   Patient informed and verbalized understanding of plan.

## 2022-01-28 ENCOUNTER — Inpatient Hospital Stay: Payer: Medicare HMO

## 2022-01-28 DIAGNOSIS — I25119 Atherosclerotic heart disease of native coronary artery with unspecified angina pectoris: Secondary | ICD-10-CM | POA: Diagnosis not present

## 2022-01-28 DIAGNOSIS — Z800 Family history of malignant neoplasm of digestive organs: Secondary | ICD-10-CM | POA: Diagnosis not present

## 2022-01-28 DIAGNOSIS — D45 Polycythemia vera: Secondary | ICD-10-CM | POA: Diagnosis not present

## 2022-01-28 DIAGNOSIS — E782 Mixed hyperlipidemia: Secondary | ICD-10-CM | POA: Diagnosis not present

## 2022-01-28 DIAGNOSIS — D75839 Thrombocytosis, unspecified: Secondary | ICD-10-CM | POA: Diagnosis not present

## 2022-01-28 DIAGNOSIS — Z789 Other specified health status: Secondary | ICD-10-CM | POA: Diagnosis not present

## 2022-01-28 DIAGNOSIS — I10 Essential (primary) hypertension: Secondary | ICD-10-CM | POA: Diagnosis not present

## 2022-01-28 DIAGNOSIS — R519 Headache, unspecified: Secondary | ICD-10-CM | POA: Diagnosis not present

## 2022-01-28 DIAGNOSIS — Z90710 Acquired absence of both cervix and uterus: Secondary | ICD-10-CM | POA: Diagnosis not present

## 2022-01-28 DIAGNOSIS — Z79899 Other long term (current) drug therapy: Secondary | ICD-10-CM | POA: Diagnosis not present

## 2022-01-28 LAB — CBC WITH DIFFERENTIAL/PLATELET
Abs Immature Granulocytes: 0.01 10*3/uL (ref 0.00–0.07)
Basophils Absolute: 0 10*3/uL (ref 0.0–0.1)
Basophils Relative: 1 %
Eosinophils Absolute: 0.3 10*3/uL (ref 0.0–0.5)
Eosinophils Relative: 5 %
HCT: 52 % — ABNORMAL HIGH (ref 36.0–46.0)
Hemoglobin: 17.2 g/dL — ABNORMAL HIGH (ref 12.0–15.0)
Immature Granulocytes: 0 %
Lymphocytes Relative: 28 %
Lymphs Abs: 1.6 10*3/uL (ref 0.7–4.0)
MCH: 29.8 pg (ref 26.0–34.0)
MCHC: 33.1 g/dL (ref 30.0–36.0)
MCV: 90.1 fL (ref 80.0–100.0)
Monocytes Absolute: 0.3 10*3/uL (ref 0.1–1.0)
Monocytes Relative: 5 %
Neutro Abs: 3.4 10*3/uL (ref 1.7–7.7)
Neutrophils Relative %: 61 %
Platelets: 469 10*3/uL — ABNORMAL HIGH (ref 150–400)
RBC: 5.77 MIL/uL — ABNORMAL HIGH (ref 3.87–5.11)
RDW: 17.8 % — ABNORMAL HIGH (ref 11.5–15.5)
WBC: 5.6 10*3/uL (ref 4.0–10.5)
nRBC: 0 % (ref 0.0–0.2)

## 2022-01-29 ENCOUNTER — Ambulatory Visit (HOSPITAL_COMMUNITY)
Admission: RE | Admit: 2022-01-29 | Discharge: 2022-01-29 | Disposition: A | Discharge status: Home / Self Care | Payer: Medicare HMO | Source: Ambulatory Visit | Attending: Nurse Practitioner | Admitting: Nurse Practitioner

## 2022-01-29 DIAGNOSIS — I25119 Atherosclerotic heart disease of native coronary artery with unspecified angina pectoris: Secondary | ICD-10-CM | POA: Insufficient documentation

## 2022-01-29 LAB — ECHOCARDIOGRAM COMPLETE
AR max vel: 2.9 cm2
AV Area VTI: 3.03 cm2
AV Area mean vel: 3.01 cm2
AV Mean grad: 3 mmHg
AV Peak grad: 5.3 mmHg
Ao pk vel: 1.15 m/s
Area-P 1/2: 2.19 cm2
MV VTI: 2.26 cm2
S' Lateral: 2.7 cm

## 2022-01-29 LAB — LIPID PANEL
Chol/HDL Ratio: 3.4 ratio (ref 0.0–4.4)
Cholesterol, Total: 172 mg/dL (ref 100–199)
HDL: 51 mg/dL (ref >39)
LDL Chol Calc (NIH): 106 mg/dL — ABNORMAL HIGH (ref 0–99)
Triglycerides: 78 mg/dL (ref 0–149)
VLDL Cholesterol Cal: 15 mg/dL (ref 5–40)

## 2022-01-29 LAB — TSH: TSH: 6.53 u[IU]/mL — ABNORMAL HIGH (ref 0.450–4.500)

## 2022-01-29 LAB — T3, FREE: T3, Free: 2.5 pg/mL (ref 2.0–4.4)

## 2022-01-29 LAB — T4, FREE: Free T4: 1.17 ng/dL (ref 0.82–1.77)

## 2022-01-29 NOTE — Progress Notes (Signed)
*  PRELIMINARY RESULTS* Echocardiogram 2D Echocardiogram has been performed.  [name] [name] 01/29/2022, 9:21 AM

## 2022-02-16 ENCOUNTER — Other Ambulatory Visit: Payer: Self-pay | Admitting: *Deleted

## 2022-02-16 ENCOUNTER — Ambulatory Visit (INDEPENDENT_AMBULATORY_CARE_PROVIDER_SITE_OTHER): Payer: Medicare HMO | Admitting: Family Medicine

## 2022-02-16 ENCOUNTER — Inpatient Hospital Stay: Payer: Medicare HMO

## 2022-02-16 ENCOUNTER — Inpatient Hospital Stay: Payer: Medicare HMO | Admitting: Physician Assistant

## 2022-02-16 ENCOUNTER — Encounter: Payer: Self-pay | Admitting: Family Medicine

## 2022-02-16 VITALS — BP 152/90 | HR 66 | Ht 63.0 in | Wt 170.0 lb

## 2022-02-16 DIAGNOSIS — D45 Polycythemia vera: Secondary | ICD-10-CM

## 2022-02-16 DIAGNOSIS — I10 Essential (primary) hypertension: Secondary | ICD-10-CM

## 2022-02-16 NOTE — Assessment & Plan Note (Signed)
Uncontrolled Secondary to polycythemia vera She reports following up with cardiology and oncology She takes hygroton  25 mg (12.'5mg'$  tablet daily), carvedilol '25mg'$  twice daily and spironolactone 25 mg daily She is following up with the hypertensive clinic on 03/08/22 She follows up with the nephrologist on 02/18/2022 She complains of severe headaches and ringing in her ears that she reports is only relieved by taking Xanax Inform patient that a referral will be placed to psychiatry for chronic Xanax management and patient declines referral She reports that she will be getting labs done by her nephrologist to assess her kidneys Will wait and see lab result before making alterations in her BP meds Encourage low-sodium diet with increased physical activities Will follow-up on BP in 2 weeks BP Readings from Last 3 Encounters:  02/16/22 (!) 152/90  01/26/22 (!) 140/99  01/14/22 (!) 176/107

## 2022-02-16 NOTE — Progress Notes (Unsigned)
New Patient Office Visit  Subjective:  Patient ID: [name] [name], female    DOB: 04/10/1945  Age: 77 y.o. MRN: [id number]  CC:  Chief Complaint  Patient presents with   Establish Care    New patient, previously seeing Dr. Legrand Rams. Establishing care, reports ringing In her head all the time, severe headaches, only thing that helps her is 1/2 tablet of xanax.    HPI [name] is a 77 y.o. female with past medical history of polycythemia vera, and hypertension presents for establishing care. For the details of today's visit, please refer to the assessment and plan.     Past Medical History:  Diagnosis Date   Anxiety    Dysphagia 09/03/2020   Essential hypertension    HLD (hyperlipidemia)    Hx of statin intolerance   Hypothyroidism    NSTEMI (non-ST elevated myocardial infarction) ([place]) 2015   a.  Related to occlusion of the distal Cx. The mid to distal OM1 also contains a significant stenosis that is best treated with medical therapy. Patent RCA/LAD with tortuosity. LM patent. EF 55%.    Polycythemia vera ([place])    Stage 3b chronic kidney disease     Past Surgical History:  Procedure Laterality Date   ABDOMINAL HYSTERECTOMY     CARDIAC CATHETERIZATION  ~ 2000   CORONARY ANGIOPLASTY  09/20/2013   LEFT HEART CATHETERIZATION WITH CORONARY ANGIOGRAM N/A 09/20/2013   Procedure: LEFT HEART CATHETERIZATION WITH CORONARY ANGIOGRAM;  Surgeon: Sinclair Grooms, MD;  Location: [facility] CATH LAB;  Service: Cardiovascular;  Laterality: N/A;   LEFT HEART CATHETERIZATION WITH CORONARY ANGIOGRAM N/A 09/27/2013   Procedure: LEFT HEART CATHETERIZATION WITH CORONARY ANGIOGRAM;  Surgeon: Blane Ohara, MD;  Location: [facility] CATH LAB;  Service: Cardiovascular;  Laterality: N/A;   THYROIDECTOMY  10/08/2011   Procedure: THYROIDECTOMY;  Surgeon: [name], MD;  Location: [facility] ORS;  Service: General;  Laterality: N/A;  Total   VAGINAL HYSTERECTOMY  1993    Family History  Problem Relation Age  of Onset   Colon cancer Father    Thyroid disease Neg Hx     Social History   Socioeconomic History   Marital status: Widowed    Spouse name: Not on file   Number of children: Not on file   Years of education: Not on file   Highest education level: Not on file  Occupational History   Not on file  Tobacco Use   Smoking status: Never   Smokeless tobacco: Never  Vaping Use   Vaping Use: Never used  Substance and Sexual Activity   Alcohol use: No    Alcohol/week: 0.0 standard drinks of alcohol   Drug use: No   Sexual activity: Never  Other Topics Concern   Not on file  Social History Narrative   ** Merged History Encounter **  Lives at home with family.  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      Social Determinants of Health   Financial Resource Strain: Low Risk  (07/21/2020)   Overall Financial Resource Strain (CARDIA)    Difficulty of Paying Living Expenses: Not very hard  Food Insecurity: No Food Insecurity (07/21/2020)   Hunger Vital Sign    Worried About Running Out of Food in the Last Year: Never true    Ran Out of Food in the Last Year: Never true  Transportation Needs: No Transportation Needs (09/08/2020)   PRAPARE - Hydrologist (Medical): No  Lack of Transportation (Non-Medical): No  Physical Activity: Sufficiently Active (03/11/2020)   Exercise Vital Sign    Days of Exercise per Week: 5 days    Minutes of Exercise per Session: 30 min  Stress: No Stress Concern Present (03/11/2020)   [place]    Feeling of Stress : Not at all  Social Connections: Not on file  Intimate Partner Violence: Not on file    ROS Review of Systems  Constitutional:  Negative for chills and fever.  HENT:         Ringing in the ears  Eyes:  Negative for visual disturbance.  Respiratory:  Negative for chest tightness and shortness of breath.   Neurological:  Positive for headaches. Negative for  dizziness.    Objective:   Today's Vitals: BP (!) 152/90 (BP Location: Left Arm)   Pulse 66   Ht '5\' 3"'$  (1.6 m)   Wt 170 lb 0.6 oz (77.1 kg)   SpO2 93%   BMI 30.12 kg/m   Physical Exam HENT:     Head: Normocephalic.     Mouth/Throat:     Mouth: Mucous membranes are moist.  Cardiovascular:     Rate and Rhythm: Normal rate.     Heart sounds: Normal heart sounds.  Pulmonary:     Effort: Pulmonary effort is normal.     Breath sounds: Normal breath sounds.  Neurological:     Mental Status: She is alert.      Assessment & Plan:   Essential hypertension Assessment & Plan: Uncontrolled Secondary to polycythemia vera She reports following up with cardiology and oncology She takes hygroton  25 mg (12.'5mg'$  tablet daily), carvedilol '25mg'$  twice daily and spironolactone 25 mg daily She is following up with the hypertensive clinic on 03/08/22 She follows up with the nephrologist on 02/18/2022 She complains of severe headaches and ringing in her ears that she reports is only relieved by taking Xanax Inform patient that a referral will be placed to psychiatry for chronic Xanax management and patient declines referral She reports that she will be getting labs done by her nephrologist to assess her kidneys Will wait and see lab result before making alterations in her BP meds Encourage low-sodium diet with increased physical activities Will follow-up on BP in 2 weeks BP Readings from Last 3 Encounters:  02/16/22 (!) 152/90  01/26/22 (!) 140/99  01/14/22 (!) 176/107        Polycythemia vera ([place]) Assessment & Plan: Diagnosed a few months ago She is following with oncology      Follow-up: Return in about 2 weeks (around 03/02/2022).   Alvira Monday, FNP

## 2022-02-16 NOTE — Patient Instructions (Signed)
I appreciate the opportunity to provide care to you today!    Follow up:2 weeks  Labs: next visit     Please continue to a heart-healthy diet and increase your physical activities. Try to exercise for 51mns at least five times a week.      It was a pleasure to see you and I look forward to continuing to work together on your health and well-being. Please do not hesitate to call the office if you need care or have questions about your care.   Have a wonderful day and week. With Gratitude, GAlvira MondayMSN, FNP-BC

## 2022-02-16 NOTE — Assessment & Plan Note (Signed)
Diagnosed a few months ago She is following with oncology

## 2022-02-18 NOTE — Progress Notes (Signed)
[name] [name], [place] [address]   CLINIC:  Medical Oncology/Hematology  PCP:  Alvira Monday, [address] [place] [address] [phone]   REASON FOR VISIT:  Follow-up for JAK2 polycythemia vera  PRIOR THERAPY: None  CURRENT THERAPY: Hydrea 500 mg daily + aspirin 81 mg  INTERVAL HISTORY:   Ms. [name] [name] 77 y.o. female returns for routine follow-up of her polycythemia vera.  She was last seen by Tarri Abernethy PA-C on 01/14/2022.  At today's visit, she reports feeling fair.  No recent hospitalizations, surgeries, or changes in baseline health status.  She has been taking Hydrea 500 mg daily for the past month and is tolerating it fairly well.  She denies any significant increase in fatigue.   No mouth sores, skin sores, or GI upset.  She continues to have ongoing headaches and tinnitus.  No aquagenic pruritus or erythromelalgia.  No B symptoms such as fever, night sweats, or weight loss.   She reports sinus infections 1-2 times a year, but no other infections.   She has 100% energy and 100% appetite. She endorses that she is maintaining a stable weight.   ASSESSMENT & PLAN:  1.   JAK2 positive polycythemia vera - Seen at request of Dr. Theador Hawthorne for elevated hemoglobin and platelet count - Review of EMR shows platelet count elevated since August 2015,  Hemoglobin elevated since January 2019 - Symptomatic with persistent headaches and tinnitus.  No aquagenic pruritus or erythromelalgia.  No B symptoms. - No history of DVT, PE, MI, or CVA. - She is a lifelong non-smoker and denies any chemical exposure or known history of sleep apnea. - Hematology work-up (11/20/2021): JAK2 V617F positive Low erythropoietin 1.8, in keeping with polycythemia vera BCR ABL FISH negative.  ESR and CRP negative. - Reviewed CBC (11/20/2021): Hgb 16.9/HCT 51.1, platelets 613 - Hydrea 500 mg daily + aspirin 81 mg daily started 01/14/2022 - Labs today  (02/19/2022): Platelets improved at 459.  Hgb 17.5/HCT 53.6.  Normal WBC and differential. - Extensive discussion with patient (and her daughter) regarding diagnosis of polycythemia vera, including risk of thrombosis (CVA, MI, DVT, PE) as well as risk of myelofibrosis and leukemia in some patients. - Based on her age, she is considered high risk and therefore recommended cytoreductive therapy with hydroxyurea.  We discussed potential side effects of hydroxyurea.  Although patient was very hesitant and anxious about starting Hydrea, she agreed to do so as we have extensively discussed that the risks of not receiving treatment for PV would be high risk of thrombosis and fatality within the next 1 to 2 years. - PLAN: Blood counts are NOT at goal.  We will INCREASE Hydrea to 1000 mg daily.  Continue aspirin 81 mg daily. - Same-day labs (CBC/D, CMP, LDH) and office visit in 4 weeks   2.  Blood pressure -Elevated blood pressure today 163/83, which is in keeping with past readings per review of EMR - Patient reports ongoing headache for the past 2 months - No neurologic deficits, acute chest pain, or dyspnea - PLAN: Patient advised to proceed to emergency department if blood pressure does not decrease to <160/90 after she returns home. - Advised to speak with her cardiologist and nephrologist regarding antihypertensive medication adjustments. - Advised to establish care with local PCP, with information given regarding [facility], who is currently accepting new patients. - Educated on alarm symptoms that would prompt immediate medical evaluation in emergency department  3.  Social/family history: - Lives at home with her family.  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  She was never smoker. - No family history of leukemias or malignancies.   PLAN SUMMARY: >> Same day labs (CBC/D) + OFFICE visit in 4 weeks    REVIEW  OF SYSTEMS:   Review of Systems  Constitutional:  Negative for appetite change, chills, diaphoresis, fatigue, fever and unexpected weight change.  HENT:   Positive for tinnitus. Negative for lump/mass and nosebleeds.   Eyes:  Negative for eye problems.  Respiratory:  Positive for cough. Negative for hemoptysis and shortness of breath.   Cardiovascular:  Negative for chest pain, leg swelling and palpitations.  Gastrointestinal:  Negative for abdominal pain, blood in stool, constipation, diarrhea, nausea and vomiting.  Genitourinary:  Negative for hematuria.   Musculoskeletal:  Positive for back pain.  Skin: Negative.   Neurological:  Positive for headaches. Negative for dizziness and light-headedness.  Hematological:  Does not bruise/bleed easily.     PHYSICAL EXAM:  ECOG PERFORMANCE STATUS: 1 - Symptomatic but completely ambulatory There were no vitals filed for this visit. There were no vitals filed for this visit. Physical Exam Constitutional:      Appearance: Normal appearance. She is obese.  HENT:     Head: Normocephalic and atraumatic.     Mouth/Throat:     Mouth: Mucous membranes are moist.  Eyes:     Extraocular Movements: Extraocular movements intact.     Pupils: Pupils are equal, round, and reactive to light.  Cardiovascular:     Rate and Rhythm: Normal rate and regular rhythm.     Pulses: Normal pulses.     Heart sounds: Normal heart sounds.  Pulmonary:     Effort: Pulmonary effort is normal.     Breath sounds: Normal breath sounds.  Abdominal:     General: Bowel sounds are normal.     Palpations: Abdomen is soft.     Tenderness: There is no abdominal tenderness.  Musculoskeletal:        General: No swelling.     Right lower leg: No edema.     Left lower leg: No edema.  Lymphadenopathy:     Cervical: No cervical adenopathy.  Skin:    General: Skin is warm and dry.  Neurological:     General: No focal deficit present.     Mental Status: She is alert and  oriented to person, place, and time.  Psychiatric:        Mood and Affect: Mood normal.        Behavior: Behavior normal.     PAST MEDICAL/SURGICAL HISTORY:  Past Medical History:  Diagnosis Date   Anxiety    Dysphagia 09/03/2020   Essential hypertension    HLD (hyperlipidemia)    Hx of statin intolerance   Hypothyroidism    NSTEMI (non-ST elevated myocardial infarction) ([place]) 2015   a.  Related to occlusion of the distal Cx. The mid to distal OM1 also contains a significant stenosis that is best treated with medical therapy. Patent RCA/LAD with tortuosity. LM patent. EF 55%.    Polycythemia vera ([place])    Stage 3b chronic kidney disease    Past Surgical History:  Procedure Laterality Date   ABDOMINAL HYSTERECTOMY     CARDIAC CATHETERIZATION  ~ 2000   CORONARY ANGIOPLASTY  09/20/2013   LEFT HEART CATHETERIZATION WITH CORONARY ANGIOGRAM N/A 09/20/2013   Procedure:  LEFT HEART CATHETERIZATION WITH CORONARY ANGIOGRAM;  Surgeon: Sinclair Grooms, MD;  Location: [facility] CATH LAB;  Service: Cardiovascular;  Laterality: N/A;   LEFT HEART CATHETERIZATION WITH CORONARY ANGIOGRAM N/A 09/27/2013   Procedure: LEFT HEART CATHETERIZATION WITH CORONARY ANGIOGRAM;  Surgeon: Blane Ohara, MD;  Location: [facility] CATH LAB;  Service: Cardiovascular;  Laterality: N/A;   THYROIDECTOMY  10/08/2011   Procedure: THYROIDECTOMY;  Surgeon: [name], MD;  Location: [facility] ORS;  Service: General;  Laterality: N/A;  Total   VAGINAL HYSTERECTOMY  1993    SOCIAL HISTORY:  Social History   Socioeconomic History   Marital status: Widowed    Spouse name: Not on file   Number of children: Not on file   Years of education: Not on file   Highest education level: Not on file  Occupational History   Not on file  Tobacco Use   Smoking status: Never   Smokeless tobacco: Never  Vaping Use   Vaping Use: Never used  Substance and Sexual Activity   Alcohol use: No    Alcohol/week: 0.0 standard drinks of alcohol   Drug  use: No   Sexual activity: Never  Other Topics Concern   Not on file  Social History Narrative   ** Merged History Encounter **  Lives at home with family.  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      Social Determinants of Health   Financial Resource Strain: Low Risk  (07/21/2020)   Overall Financial Resource Strain (CARDIA)    Difficulty of Paying Living Expenses: Not very hard  Food Insecurity: No Food Insecurity (07/21/2020)   Hunger Vital Sign    Worried About Running Out of Food in the Last Year: Never true    Ran Out of Food in the Last Year: Never true  Transportation Needs: No Transportation Needs (09/08/2020)   PRAPARE - Hydrologist (Medical): No    Lack of Transportation (Non-Medical): No  Physical Activity: Sufficiently Active (03/11/2020)   Exercise Vital Sign    Days of Exercise per Week: 5 days    Minutes of Exercise per Session: 30 min  Stress: No Stress Concern Present (03/11/2020)   [place]    Feeling of Stress : Not at all  Social Connections: Not on file  Intimate Partner Violence: Not on file    FAMILY HISTORY:  Family History  Problem Relation Age of Onset   Colon cancer Father    Thyroid disease Neg Hx     CURRENT MEDICATIONS:  Outpatient Encounter Medications as of 02/19/2022  Medication Sig   aspirin EC 81 MG tablet Take 1 tablet (81 mg total) by mouth daily. Swallow whole.   carvedilol (COREG) 25 MG tablet Take 1 tablet (25 mg total) by mouth 2 (two) times daily.   chlorthalidone (HYGROTON) 25 MG tablet Take 0.5 tablets (12.5 mg total) by mouth daily.   hydroxyurea (HYDREA) 500 MG capsule Take 1 capsule (500 mg total) by mouth daily. May take with food to minimize GI side effects. (Patient not taking: Reported on 02/16/2022)   levothyroxine (SYNTHROID) 125 MCG tablet Take 1 tablet (125 mcg total) by mouth daily before breakfast. 1 HOUR PRIOR TO OTHER MEDICATIONS    spironolactone (ALDACTONE) 25 MG tablet Take 1 tablet (25 mg total) by mouth daily.   No facility-administered encounter medications on file as of 02/19/2022.    ALLERGIES:  Allergies  Allergen Reactions   Fluviral [  Influenza Virus Vaccine] Shortness Of Breath, Swelling and Other (See Comments)    Including back pain that radiates in upper extremeties   Haemophilus Influenzae Other (See Comments), Shortness Of Breath and Swelling    Including back pain that radiates in upper extremeties   Amlodipine     Lip swelling per patient.    Lisinopril     Dr. Legrand Rams stopped due to side effects per patient.  Could remember what side effects were.     Ranexa [Ranolazine]     Chest pain    LABORATORY DATA:  I have reviewed the labs as listed.  CBC    Component Value Date/Time   WBC 5.6 01/28/2022 1114   RBC 5.77 (H) 01/28/2022 1114   HGB 17.2 (H) 01/28/2022 1114   HCT 52.0 (H) 01/28/2022 1114   PLT 469 (H) 01/28/2022 1114   MCV 90.1 01/28/2022 1114   MCH 29.8 01/28/2022 1114   MCHC 33.1 01/28/2022 1114   RDW 17.8 (H) 01/28/2022 1114   LYMPHSABS 1.6 01/28/2022 1114   MONOABS 0.3 01/28/2022 1114   EOSABS 0.3 01/28/2022 1114   BASOSABS 0.0 01/28/2022 1114      Latest Ref Rng & Units 01/14/2022   10:52 AM 02/07/2021    4:50 AM 09/10/2020   11:16 AM  CMP  Glucose 70 - 99 mg/dL 91  97  87   BUN 8 - 23 mg/dL '19  27  22   '$ Creatinine 0.44 - 1.00 mg/dL 1.48  1.34  1.16   Sodium 135 - 145 mmol/L 135  137  143   Potassium 3.5 - 5.1 mmol/L 4.2  3.9  4.5   Chloride 98 - 111 mmol/L 100  104  100   CO2 22 - 32 mmol/L '26  24  23   '$ Calcium 8.9 - 10.3 mg/dL 9.3  9.2  9.7   Total Protein 6.5 - 8.1 g/dL 8.0     Total Bilirubin 0.3 - 1.2 mg/dL 0.7     Alkaline Phos 38 - 126 U/L 50     AST 15 - 41 U/L 31     ALT 0 - 44 U/L 29       DIAGNOSTIC IMAGING:  I have independently reviewed the relevant imaging and discussed with the patient.   WRAP UP:  All questions were answered. The patient knows  to call the clinic with any problems, questions or concerns.  Medical decision making: Moderate  Time spent on visit: I spent 20 minutes counseling the patient face to face. The total time spent in the appointment was 30 minutes and more than 50% was on counseling.  Harriett Rush, PA-C  02/19/22 12:12 PM

## 2022-02-19 ENCOUNTER — Inpatient Hospital Stay: Payer: Medicare HMO

## 2022-02-19 ENCOUNTER — Inpatient Hospital Stay: Payer: Medicare HMO | Attending: Hematology | Admitting: Physician Assistant

## 2022-02-19 VITALS — BP 163/83 | HR 69 | Temp 97.9°F | Resp 16 | Wt 169.5 lb

## 2022-02-19 DIAGNOSIS — I129 Hypertensive chronic kidney disease with stage 1 through stage 4 chronic kidney disease, or unspecified chronic kidney disease: Secondary | ICD-10-CM | POA: Diagnosis not present

## 2022-02-19 DIAGNOSIS — Z800 Family history of malignant neoplasm of digestive organs: Secondary | ICD-10-CM | POA: Diagnosis not present

## 2022-02-19 DIAGNOSIS — D45 Polycythemia vera: Secondary | ICD-10-CM

## 2022-02-19 DIAGNOSIS — Z90710 Acquired absence of both cervix and uterus: Secondary | ICD-10-CM | POA: Diagnosis not present

## 2022-02-19 DIAGNOSIS — I10 Essential (primary) hypertension: Secondary | ICD-10-CM

## 2022-02-19 DIAGNOSIS — N1832 Chronic kidney disease, stage 3b: Secondary | ICD-10-CM | POA: Insufficient documentation

## 2022-02-19 LAB — COMPREHENSIVE METABOLIC PANEL
ALT: 23 U/L (ref 0–44)
AST: 26 U/L (ref 15–41)
Albumin: 3.9 g/dL (ref 3.5–5.0)
Alkaline Phosphatase: 46 U/L (ref 38–126)
Anion gap: 9 (ref 5–15)
BUN: 25 mg/dL — ABNORMAL HIGH (ref 8–23)
CO2: 24 mmol/L (ref 22–32)
Calcium: 9.2 mg/dL (ref 8.9–10.3)
Chloride: 106 mmol/L (ref 98–111)
Creatinine, Ser: 1.25 mg/dL — ABNORMAL HIGH (ref 0.44–1.00)
GFR, Estimated: 45 mL/min — ABNORMAL LOW (ref >60)
Glucose, Bld: 86 mg/dL (ref 70–99)
Potassium: 4.4 mmol/L (ref 3.5–5.1)
Sodium: 139 mmol/L (ref 135–145)
Total Bilirubin: 0.6 mg/dL (ref 0.3–1.2)
Total Protein: 7.5 g/dL (ref 6.5–8.1)

## 2022-02-19 LAB — CBC WITH DIFFERENTIAL/PLATELET
Abs Immature Granulocytes: 0.01 10*3/uL (ref 0.00–0.07)
Basophils Absolute: 0 10*3/uL (ref 0.0–0.1)
Basophils Relative: 1 %
Eosinophils Absolute: 0.3 10*3/uL (ref 0.0–0.5)
Eosinophils Relative: 5 %
HCT: 53.6 % — ABNORMAL HIGH (ref 36.0–46.0)
Hemoglobin: 17.5 g/dL — ABNORMAL HIGH (ref 12.0–15.0)
Immature Granulocytes: 0 %
Lymphocytes Relative: 32 %
Lymphs Abs: 1.6 10*3/uL (ref 0.7–4.0)
MCH: 29.9 pg (ref 26.0–34.0)
MCHC: 32.6 g/dL (ref 30.0–36.0)
MCV: 91.5 fL (ref 80.0–100.0)
Monocytes Absolute: 0.3 10*3/uL (ref 0.1–1.0)
Monocytes Relative: 6 %
Neutro Abs: 2.9 10*3/uL (ref 1.7–7.7)
Neutrophils Relative %: 56 %
Platelets: 459 10*3/uL — ABNORMAL HIGH (ref 150–400)
RBC: 5.86 MIL/uL — ABNORMAL HIGH (ref 3.87–5.11)
RDW: 18.9 % — ABNORMAL HIGH (ref 11.5–15.5)
WBC: 5.1 10*3/uL (ref 4.0–10.5)
nRBC: 0 % (ref 0.0–0.2)

## 2022-02-19 LAB — PROTEIN / CREATININE RATIO, URINE
Creatinine, Urine: 185.01 mg/dL
Protein Creatinine Ratio: 0.05 mg/mg{Cre} (ref 0.00–0.15)
Total Protein, Urine: 9 mg/dL

## 2022-02-19 LAB — LACTATE DEHYDROGENASE: LDH: 212 U/L — ABNORMAL HIGH (ref 98–192)

## 2022-02-19 MED ORDER — HYDROXYUREA 500 MG PO CAPS: 1000.0000 mg | ORAL_CAPSULE | Freq: Every day | ORAL | 0 refills | Status: DC

## 2022-02-19 NOTE — Patient Instructions (Signed)
[place] at [place] **   You were seen today by Tarri Abernethy PA-C for your elevated red blood cells and elevated platelets.  This is due to a genetic mutation (called JAK2) that causes a blood condition called "polycythemia vera" where your bone marrow makes too many blood cells. Having too many blood cells places you at increased risk of blood clots, heart attack, and stroke (due to your blood being too thick). The best treatment for this is a medication called Hydrea (hydroxyurea) which decreases the amount of blood cells made by your bone marrow. It is also recommended that you take aspirin 81 mg daily to make your blood less sticky and decrease your risk of blood clots. We will INCREASE the dose of your Hydrea to 1000 mg (2 capsules) once a day and aspirin 81 mg once a day. We will check blood counts AND see you for an office visit in 4 weeks.  ELEVATED BLOOD PRESSURE: Please reach out to your cardiologist and/or nephrologist for adjustment of your blood pressure medications.  If you continue to have elevated blood pressure and severe headache, I recommend evaluation in the emergency department.  ** Thank you for trusting me with your healthcare!  I strive to provide all of my patients with quality care at each visit.  If you receive a survey for this visit, I would be so grateful to you for taking the time to provide feedback.  Thank you in advance!  ~ [place] Farnell                   Dr. Derek Jack   &   Tarri Abernethy, PA-C   - - - - - - - - - - - - - - - - - -    Thank you for choosing [place] at [facility] to provide your oncology and hematology care.  To afford each patient quality time with our provider, please arrive at least 15 minutes before your scheduled appointment time.   If you have a lab appointment with the [facility] please come in thru the Main Entrance and check  in at the main information desk.  You need to re-schedule your appointment should you arrive 10 or more minutes late.  We strive to give you quality time with our providers, and arriving late affects you and other patients whose appointments are after yours.  Also, if you no show three or more times for appointments you may be dismissed from the clinic at the providers discretion.     Again, thank you for choosing [facility].  Our hope is that these requests will decrease the amount of time that you wait before being seen by our physicians.       _____________________________________________________________  Should you have questions after your visit to [facility], please contact our office at [phone] and follow the prompts.  Our office hours are 8:00 a.m. and 4:30 p.m. Monday - Friday.  Please note that voicemails left after 4:00 p.m. may not be returned until the following business day.  We are closed weekends and major holidays.  You do have access to a nurse 24-7, just call the main number to the clinic [phone] and do not press any options, hold on the line and a nurse will answer the phone.    For prescription refill requests, have your pharmacy contact our office and allow  72 hours.

## 2022-03-03 ENCOUNTER — Ambulatory Visit: Payer: Medicaid Other | Admitting: Family Medicine

## 2022-03-05 NOTE — Progress Notes (Incomplete)
Advanced [facility]pert[facility] Initial Assessment:    Date:  03/05/2022   ID:  [name], [name] 1945-06-03, MRN [id number]  PCP:  [name], MD  Cardiologist:  [name], MD  Nephrologist:  Referring MD: Finis Bud, NP   CC: Hypertension  History of Present Illness:    [name] [name] is a 77 y.o. female with a hx of hypertension, *** here to establish care in the [facility].   Today,  She denies any palpitations, chest pain, shortness of breath, or peripheral edema. No lightheadedness, headaches, syncope, orthopnea, or PND.  (+)  ***Plan: -  Previous antihypertensives:   Past Medical History:  Diagnosis Date   Anxiety    Dysphagia 09/03/2020   Essential hypertension    HLD (hyperlipidemia)    Hx of statin intolerance   Hypothyroidism    NSTEMI (non-ST elevated myocardial infarction) ([place]) 2015   a.  Related to occlusion of the distal Cx. The mid to distal OM1 also contains a significant stenosis that is best treated with medical therapy. Patent RCA/LAD with tortuosity. LM patent. EF 55%.    Polycythemia vera ([place])    Stage 3b chronic kidney disease     Past Surgical History:  Procedure Laterality Date   ABDOMINAL HYSTERECTOMY     CARDIAC CATHETERIZATION  ~ 2000   CORONARY ANGIOPLASTY  09/20/2013   LEFT HEART CATHETERIZATION WITH CORONARY ANGIOGRAM N/A 09/20/2013   Procedure: LEFT HEART CATHETERIZATION WITH CORONARY ANGIOGRAM;  Surgeon: Sinclair Grooms, MD;  Location: [facility] CATH LAB;  Service: Cardiovascular;  Laterality: N/A;   LEFT HEART CATHETERIZATION WITH CORONARY ANGIOGRAM N/A 09/27/2013   Procedure: LEFT HEART CATHETERIZATION WITH CORONARY ANGIOGRAM;  Surgeon: Blane Ohara, MD;  Location: [facility] CATH LAB;  Service: Cardiovascular;  Laterality: N/A;   THYROIDECTOMY  10/08/2011   Procedure: THYROIDECTOMY;  Surgeon: [name], MD;  Location: [facility];  Service: General;  Laterality: N/A;  Total   VAGINAL HYSTERECTOMY   1993    Current Medications: No outpatient medications have been marked as taking for the 03/08/22 encounter (Appointment) with Skeet Latch, MD.     Allergies:   Fluviral [influenza virus vaccine], Haemophilus influenzae, Amlodipine, Lisinopril, and Ranexa [ranolazine]   Social History   Socioeconomic History   Marital status: Widowed    Spouse name: Not on file   Number of children: Not on file   Years of education: Not on file   Highest education level: Not on file  Occupational History   Not on file  Tobacco Use   Smoking status: Never   Smokeless tobacco: Never  Vaping Use   Vaping Use: Never used  Substance and Sexual Activity   Alcohol use: No    Alcohol/week: 0.0 standard drinks of alcohol   Drug use: No   Sexual activity: Never  Other Topics Concern   Not on file  Social History Narrative   ** Merged History Encounter **  Lives at home with family.  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      Social Determinants of Health   Financial Resource Strain: Low Risk  (07/21/2020)   Overall Financial Resource Strain (CARDIA)    Difficulty of Paying Living Expenses: Not very hard  Food Insecurity: No Food Insecurity (07/21/2020)   Hunger Vital Sign    Worried About Running Out of Food in the Last Year: Never true    Ran Out of Food in the Last Year: Never true  Transportation Needs: No Transportation Needs (  09/08/2020)   PRAPARE - Hydrologist (Medical): No    Lack of Transportation (Non-Medical): No  Physical Activity: Sufficiently Active (03/11/2020)   Exercise Vital Sign    Days of Exercise per Week: 5 days    Minutes of Exercise per Session: 30 min  Stress: No Stress Concern Present (03/11/2020)   [place]    Feeling of Stress : Not at all  Social Connections: Not on file     Family History: The patient's family history includes Colon cancer in her father. There is no history  of Thyroid disease.  ROS:   Please see the history of present illness.     All other systems reviewed and are negative.  EKGs/Labs/Other Studies Reviewed:    Echo  01/29/2022: 1. Left ventricular ejection fraction, by estimation, is 55 to 60%. The  left ventricle has normal function. The left ventricle has no regional  wall motion abnormalities. 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 Left ventricular diastolic   parameters are consistent with Grade I diastolic dysfunction (impaired  relaxation).   2. Right ventricular systolic function is normal. The right ventricular  size is normal. There is normal pulmonary artery systolic pressure. The  estimated right ventricular systolic pressure is 32.9 mmHg.   3. Left atrial size was mildly dilated.   4. The mitral valve is normal in structure. Trivial mitral valve  regurgitation. No evidence of mitral stenosis.   5. The aortic valve is tricuspid. Aortic valve regurgitation is not  visualized. No aortic stenosis is present.   6. The inferior vena cava is normal in size with greater than 50%  respiratory variability, suggesting right atrial pressure of 3 mmHg.   Comparison(s): No prior Echocardiogram.   Bilateral Renal Artery Dopplers  04/24/2020: Summary:  Largest Aortic Diameter: 2.3 cm    Renal:    Right: Normal size right kidney. Normal cortical thickness of right         kidney. Normal right Resisitive Index. No evidence of right         renal artery stenosis. RRV flow present.  Left:  Normal size of left kidney. Normal left Resistive Index.         Normal cortical thickness of the left kidney. No evidence of         left renal artery stenosis. LRV flow present.  Mesenteric:  Normal Celiac artery and Superior Mesenteric artery findings.  IVC is patent.   Nuclear Myoview  09/24/2015: Blood pressure demonstrated a hypertensive response to exercise. There was no ST segment deviation noted during stress. The  study is normal. This is a low risk study. The left ventricular ejection fraction is normal (55-65%). Duke treadmill score of 7 consistent with low risk for major cardiac events.  EKG:  EKG is personally reviewed. 03/08/2022: Sinus ***. Rate *** bpm.  Recent Labs: 01/28/2022: TSH 6.530 02/19/2022: ALT 23; BUN 25; Creatinine, Ser 1.25; Hemoglobin 17.5; Platelets 459; Potassium 4.4; Sodium 139   Recent Lipid Panel    Component Value Date/Time   CHOL 172 01/28/2022 1013   TRIG 78 01/28/2022 1013   HDL 51 01/28/2022 1013   CHOLHDL 3.4 01/28/2022 1013   CHOLHDL 3.6 09/04/2019 1216   VLDL 13 09/04/2019 1216   LDLCALC 106 (H) 01/28/2022 1013    Physical Exam:    VS:  There were no vitals taken for this visit. , BMI There is no height or  weight on file to calculate BMI. GENERAL:  Well appearing HEENT: Pupils equal round and reactive, fundi not visualized, oral mucosa unremarkable NECK:  No jugular venous distention, waveform within normal limits, carotid upstroke brisk and symmetric, no bruits, no thyromegaly LYMPHATICS:  No cervical adenopathy LUNGS:  Clear to auscultation bilaterally HEART:  RRR.  PMI not displaced or sustained,S1 and S2 within normal limits, no S3, no S4, no clicks, no rubs, *** murmurs ABD:  Flat, positive bowel sounds normal in frequency in pitch, no bruits, no rebound, no guarding, no midline pulsatile mass, no hepatomegaly, no splenomegaly EXT:  2 plus pulses throughout, no edema, no cyanosis, no clubbing SKIN:  No rashes, no nodules NEURO:  Cranial nerves II through XII grossly intact, motor grossly intact throughout PSYCH:  Cognitively intact, oriented to person place and time   ASSESSMENT/PLAN:    No problem-specific Assessment & Plan notes found for this encounter.   Screening for Secondary Hypertension: { Click here to document screening for secondary causes of HTN  :[id number]}    Relevant Labs/Studies:    Latest Ref Rng & Units 02/19/2022   10:16  AM 01/14/2022   10:52 AM 02/07/2021    4:50 AM  Basic Labs  Sodium 135 - 145 mmol/L 139  135  137   Potassium 3.5 - 5.1 mmol/L 4.4  4.2  3.9   Creatinine 0.44 - 1.00 mg/dL 1.25  1.48  1.34        Latest Ref Rng & Units 01/28/2022   10:13 AM 11/21/2020   11:00 AM  Thyroid   TSH 0.450 - 4.500 uIU/mL 6.530  2.36        Latest Ref Rng & Units 03/31/2020   11:58 AM 02/28/2020   11:01 AM  Renin/Aldosterone   Aldosterone 0.0 - 30.0 ng/dL 13.3  7.9   Renin 0.167 - 5.380 ng/mL/hr <0.167  CANCELED   Aldos/Renin Ratio 0.0 - 30.0 >79.6  CANCELED              04/24/2020   10:06 AM  Renovascular   Renal Artery [place] Completed Yes        she consents to be monitored in our remote patient monitoring program through [facility].  she will track his blood pressure twice daily and understands that these trends will help [place] to adjust her medications as needed prior to his next appointment.  she *** interested in enrolling in the PREP exercise and nutrition program through the [facility]     Disposition:   *** FU with APP/PharmD in 1 month for the next 3 months.   FU with Tiffany C. Oval Linsey, MD, [facility] in 4 months.  Medication Adjustments/Labs and Tests Ordered: Current medicines are reviewed at length with the patient today.  Concerns regarding medicines are outlined above.   No orders of the defined types were placed in this encounter.  No orders of the defined types were placed in this encounter.   I,Mathew Stumpf,acting as a Education administrator for Skeet Latch, MD.,have documented all relevant documentation on the behalf of Skeet Latch, MD,as directed by  Skeet Latch, MD while in the presence of Skeet Latch, MD.  ***  Signed, Madelin Rear  03/05/2022 1:44 PM    [place]

## 2022-03-08 ENCOUNTER — Ambulatory Visit (HOSPITAL_BASED_OUTPATIENT_CLINIC_OR_DEPARTMENT_OTHER): Payer: Medicare HMO | Admitting: Cardiovascular Disease

## 2022-03-15 ENCOUNTER — Ambulatory Visit: Payer: Medicare HMO | Attending: Nurse Practitioner | Admitting: Nurse Practitioner

## 2022-03-15 ENCOUNTER — Encounter: Payer: Self-pay | Admitting: Nurse Practitioner

## 2022-03-15 ENCOUNTER — Telehealth: Payer: Self-pay | Admitting: Cardiology

## 2022-03-15 VITALS — BP 140/85 | HR 68 | Ht 63.5 in | Wt 168.2 lb

## 2022-03-15 DIAGNOSIS — N1831 Chronic kidney disease, stage 3a: Secondary | ICD-10-CM

## 2022-03-15 DIAGNOSIS — E785 Hyperlipidemia, unspecified: Secondary | ICD-10-CM

## 2022-03-15 DIAGNOSIS — E039 Hypothyroidism, unspecified: Secondary | ICD-10-CM | POA: Diagnosis not present

## 2022-03-15 DIAGNOSIS — I1A0 Resistant hypertension: Secondary | ICD-10-CM | POA: Diagnosis not present

## 2022-03-15 DIAGNOSIS — R400 Somnolence: Secondary | ICD-10-CM | POA: Diagnosis not present

## 2022-03-15 DIAGNOSIS — I2510 Atherosclerotic heart disease of native coronary artery without angina pectoris: Secondary | ICD-10-CM | POA: Diagnosis not present

## 2022-03-15 DIAGNOSIS — I10 Essential (primary) hypertension: Secondary | ICD-10-CM

## 2022-03-15 DIAGNOSIS — R5383 Other fatigue: Secondary | ICD-10-CM

## 2022-03-15 DIAGNOSIS — G43809 Other migraine, not intractable, without status migrainosus: Secondary | ICD-10-CM

## 2022-03-15 DIAGNOSIS — Z87898 Personal history of other specified conditions: Secondary | ICD-10-CM | POA: Diagnosis not present

## 2022-03-15 DIAGNOSIS — Z789 Other specified health status: Secondary | ICD-10-CM | POA: Diagnosis not present

## 2022-03-15 MED ORDER — CARVEDILOL 25 MG PO TABS: 37.5000 mg | ORAL_TABLET | Freq: Two times a day (BID) | ORAL | 1 refills | Status: DC

## 2022-03-15 NOTE — Telephone Encounter (Signed)
*  STAT* If patient is at the pharmacy, call can be transferred to refill team.   1. Which medications need to be refilled? (please list name of each medication and dose if known)   carvedilol (COREG) 25 MG tablet  2. Which pharmacy/location (including street and city if local pharmacy) is medication to be sent to?   [place], [place] - [place]  3. Do they need a 30 day or 90 day supply?   90 day    Patient stated her medication was increased.  Patient stated she is almost out of this medication.

## 2022-03-15 NOTE — Telephone Encounter (Signed)
Patient notified that a new prescription was sent to [facility] this morning after her visit with provider. Patient verbalized understanding and stated she would go pick medication up from pharmacy.

## 2022-03-15 NOTE — Patient Instructions (Addendum)
Medication Instructions:  Your physician has recommended you make the following change in your medication:  Increase Coreg to 1.5 tablets twice daily   Labwork: None  Testing/Procedures: None  Follow-Up: Follow up with Dr. [name] or Finis Bud, NP in 4-5 months. Keep your scheduled follow up with Dr. Oval Linsey.   Any Other Special Instructions Will Be Listed Below (If Applicable).  You have been referred to Nephrology. They will contact you with your first appointment.    If you need a refill on your cardiac medications before your next appointment, please call your pharmacy.   Your physician has requested that you regularly monitor and record your blood pressure readings at home. Please use the same machine at the same time of day to check your readings and record them to bring to your follow-up visit.

## 2022-03-15 NOTE — Progress Notes (Unsigned)
Cardiology Office Note:    Date:  03/15/2022  ID:  [name], [name] July 24, 1945, MRN [id number]  PCP:  [name], MD   [facility] Prov[facility] Cardiologist:  [name], MD     Referring MD: [name]*   CC: Here for follow-up  History of Present Illness:    [name] [name] is a 77 y.o. female with a hx of the following:  CAD, s/p NSTEMI HTN Daytime fatigue/sleepiness Hypothyroidism  Multinodular goiter Statin intolreance HLD Stage IIIb chronic kidney disease (sees kidney doctor)   Patient is a very pleasant 77 year old female with past medical history as mentioned above.  Last seen by Dr. [name] on August 10, 2021 and was overall doing well from a cardiac perspective.  Since her last office visit with him, she noted that she had sinus infection, interval shingles and UTI.  Noted fluctuations in her blood pressure in office that day. No medication changes were made.  Was told to follow-up in 6 months.  01/26/2022 - I saw her for follow-up. Was diagnosed with polycythemia vera, was following Dr. [name]. Was receiving PO chemo. Described a fullness along her upper neck/chest that was attributed to medication, denied any exertional chest pain or other associated symptoms.  Was having blood pressure medication issues and has been recommended to follow-up with cardiology.  STOP-BANG score 3. Echo revealed EF 55 to 60%, severe asymmetric left ventricle hypertrophy, grade 1 DD, trivial MR, no other significant valvular abnormalities.  Was referred to advanced hypertension clinic as she has several allergies to antihypertensives.  Was set up for split-night sleep study.  03/15/22 - Doing well.  She has changed her diet.  She is now adhering to plant-based diet and performing regular exercise.  She admits to frequent headaches/migraines to give her issues.  Has not seen a neurologist in the past.  States systolic blood pressures average 140s-150s.  Denies any chest pain, shortness of breath, palpitations, syncope, presyncope, dizziness, orthopnea, PND, swelling or significant weight changes, acute bleeding, or claudication.   SH: In her free time, she enjoys going to yard sales.  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    Past Medical History:  Diagnosis Date   Anxiety    Dysphagia 09/03/2020   Essential hypertension    HLD (hyperlipidemia)    Hx of statin intolerance   Hypothyroidism    NSTEMI (non-ST elevated myocardial infarction) ([place]) 2015   a.  Related to occlusion of the distal Cx. The mid to distal OM1 also contains a significant stenosis that is best treated with medical therapy. Patent RCA/LAD with tortuosity. LM patent. EF 55%.    Polycythemia vera ([place])    Stage 3b chronic kidney disease     Past Surgical History:  Procedure Laterality Date   ABDOMINAL HYSTERECTOMY     CARDIAC CATHETERIZATION  ~ 2000   CORONARY ANGIOPLASTY  09/20/2013   LEFT HEART CATHETERIZATION WITH CORONARY ANGIOGRAM N/A 09/20/2013   Procedure: LEFT HEART CATHETERIZATION WITH CORONARY ANGIOGRAM;  Surgeon: Sinclair Grooms, MD;  Location: [facility] CATH LAB;  Service: Cardiovascular;  Laterality: N/A;   LEFT HEART CATHETERIZATION WITH CORONARY ANGIOGRAM N/A 09/27/2013   Procedure: LEFT HEART CATHETERIZATION WITH CORONARY ANGIOGRAM;  Surgeon: Blane Ohara, MD;  Location: [facility] CATH LAB;  Service: Cardiovascular;  Laterality: N/A;   THYROIDECTOMY  10/08/2011   Procedure: THYROIDECTOMY;  Surgeon: [name], MD;  Location: [facility] ORS;  Service: General;  Laterality: N/A;  Total  VAGINAL HYSTERECTOMY  1993    Current Medications: Current Meds  Medication Sig   aspirin EC 81 MG tablet Take 1 tablet (81 mg total) by mouth daily. Swallow whole.   chlorthalidone (HYGROTON) 25 MG tablet Take 0.5 tablets (12.5 mg total) by mouth daily.   hydroxyurea (HYDREA) 500 MG capsule Take 2 capsules (1,000 mg total) by mouth daily. May take with food to minimize  GI side effects.   levothyroxine (SYNTHROID) 125 MCG tablet Take 1 tablet (125 mcg total) by mouth daily before breakfast. 1 HOUR PRIOR TO OTHER MEDICATIONS   spironolactone (ALDACTONE) 25 MG tablet Take 1 tablet (25 mg total) by mouth daily.   carvedilol (COREG) 25 MG tablet Take 1 tablet (25 mg total) by mouth 2 (two) times daily.     Allergies:   Fluviral [influenza virus vaccine], Haemophilus influenzae, Amlodipine, Lisinopril, and Ranexa [ranolazine]   Social History   Socioeconomic History   Marital status: Widowed    Spouse name: Not on file   Number of children: Not on file   Years of education: Not on file   Highest education level: Not on file  Occupational History   Not on file  Tobacco Use   Smoking status: Never   Smokeless tobacco: Never  Vaping Use   Vaping Use: Never used  Substance and Sexual Activity   Alcohol use: No    Alcohol/week: 0.0 standard drinks of alcohol   Drug use: No   Sexual activity: Never  Other Topics Concern   Not on file  Social History Narrative   ** Merged History Encounter **  Lives at home with family.  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      Social Determinants of Health   Financial Resource Strain: Low Risk  (07/21/2020)   Overall Financial Resource Strain (CARDIA)    Difficulty of Paying Living Expenses: Not very hard  Food Insecurity: No Food Insecurity (07/21/2020)   Hunger Vital Sign    Worried About Running Out of Food in the Last Year: Never true    Ran Out of Food in the Last Year: Never true  Transportation Needs: No Transportation Needs (09/08/2020)   PRAPARE - Hydrologist (Medical): No    Lack of Transportation (Non-Medical): No  Physical Activity: Sufficiently Active (03/11/2020)   Exercise Vital Sign    Days of Exercise per Week: 5 days    Minutes of Exercise per Session: 30 min  Stress: No Stress Concern Present (03/11/2020)   [place]    Feeling of Stress : Not at all  Social Connections: Not on file     Family History: The patient's family history includes Colon cancer in her father. There is no history of Thyroid disease.  ROS:   Review of Systems  Constitutional: Negative.   HENT: Negative.    Eyes: Negative.   Respiratory: Negative.    Cardiovascular:  Negative for chest pain, palpitations, orthopnea, claudication, leg swelling and PND.  Gastrointestinal:  Positive for abdominal pain.       RUQ pain. Frequent "catching" sensation per patient's report.   Genitourinary: Negative.   Musculoskeletal: Negative.   Skin: Negative.   Neurological:  Positive for headaches. Negative for dizziness, tingling, tremors, sensory change, speech change, focal weakness, seizures, loss of consciousness and weakness.  Endo/Heme/Allergies: Negative.   Psychiatric/Behavioral: Negative.      Please see the history of present illness.    All other  systems reviewed and are negative.  EKGs/Labs/Other Studies Reviewed:    The following studies were reviewed today:   EKG:  EKG is not ordered today.   Echocardiogram on January 29, 2022:  1. Left ventricular ejection fraction, by estimation, is 55 to 60%. The  left ventricle has normal function. The left ventricle has no regional  wall motion abnormalities. 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 Left ventricular diastolic   parameters are consistent with Grade I diastolic dysfunction (impaired  relaxation).   2. Right ventricular systolic function is normal. The right ventricular  size is normal. There is normal pulmonary artery systolic pressure. The  estimated right ventricular systolic pressure is 19.1 mmHg.   3. Left atrial size was mildly dilated.   4. The mitral valve is normal in structure. Trivial mitral valve  regurgitation. No evidence of mitral stenosis.   5. The aortic valve is tricuspid. Aortic valve regurgitation is not   visualized. No aortic stenosis is present.   6. The inferior vena cava is normal in size with greater than 50%  respiratory variability, suggesting right atrial pressure of 3 mmHg.   Comparison(s): No prior Echocardiogram.  Bilateral renal artery ultrasound on April 24, 2020: Right: Normal size right kidney. Normal cortical thickness of right         kidney. Normal right Resisitive Index. No evidence of right         renal artery stenosis. RRV flow present.  Left:  Normal size of left kidney. Normal left Resistive Index.         Normal cortical thickness of the left kidney. No evidence of         left renal artery stenosis. LRV flow present.  Mesenteric:  Normal Celiac artery and Superior Mesenteric artery findings.  IVC is patent.  Myoview on September 24, 2015: Blood pressure demonstrated a hypertensive response to exercise. There was no ST segment deviation noted during stress. The study is normal. This is a low risk study. The left ventricular ejection fraction is normal (55-65%). Duke treadmill score of 7 consistent with low risk for major cardiac events.    2D echocardiogram on September 21, 2013: Left ventricle: The cavity size was normal. There was mild    concentric hypertrophy. Systolic function was normal. The    estimated ejection fraction was in the range of 55% to 60%. Wall    motion was normal; there were no regional wall motion    abnormalities. Doppler parameters are consistent with abnormal    left ventricular relaxation (grade 1 diastolic dysfunction).  - Left atrium: The atrium was mildly dilated.  - Pulmonary arteries: Systolic pressure was mildly increased. PA    peak pressure: 37 mm Hg (S).  - Pericardium, extracardiac: A small pericardial effusion was    identified along the right ventricular free wall.   Transthoracic echocardiography.  M-mode, complete 2D, spectral  Doppler, and color Doppler.  Birthdate:  Patient birthdate:  1945/06/05.  Age:  Patient  is 77 yr old.  Sex:  Gender: female.  BMI: 37 kg/m^2.  Blood pressure:     109/55  Patient status:  Inpatient.  Study date:  Study date: 09/21/2013. Study time: 09:24  AM.  Location:  Echo laboratory.    Recent Labs: 01/28/2022: TSH 6.530 02/19/2022: ALT 23; BUN 25; Creatinine, Ser 1.25; Hemoglobin 17.5; Platelets 459; Potassium 4.4; Sodium 139  Recent Lipid Panel    Component Value Date/Time   CHOL 172 01/28/2022 1013   TRIG  78 01/28/2022 1013   HDL 51 01/28/2022 1013   CHOLHDL 3.4 01/28/2022 1013   CHOLHDL 3.6 09/04/2019 1216   VLDL 13 09/04/2019 1216   LDLCALC 106 (H) 01/28/2022 1013     Risk Assessment/Calculations:      HYPERTENSION CONTROL Vitals:   03/15/22 1119 03/15/22 1130  BP: (!) 150/96 (!) 140/85    The patient's blood pressure is elevated above target today.  In order to address the patient's elevated BP: A current anti-hypertensive medication was adjusted today.; Blood pressure will be monitored at home to determine if medication changes need to be made.; A referral to the Advanced Hypertension Clinic will be placed.; Follow up with general cardiology has been recommended.        STOP-Bang Score:  3  {    Physical Exam:    VS:  BP (!) 140/85 (BP Location: Left Arm, Patient Position: Sitting, Cuff Size: Normal)   Pulse 68   Ht 5' 3.5" (1.613 m)   Wt 168 lb 3.2 oz (76.3 kg)   SpO2 97%   BMI 29.33 kg/m     Wt Readings from Last 3 Encounters:  03/15/22 168 lb 3.2 oz (76.3 kg)  02/19/22 169 lb 8.5 oz (76.9 kg)  02/16/22 170 lb 0.6 oz (77.1 kg)     GEN: Overweight, 77 y.o. female in no acute distress HEENT: Normal NECK: No JVD; No carotid bruits CARDIAC: S1/S2, RRR, no murmurs, rubs, gallops; 2+ peripheral pulses throughout, strong equal bilaterally RESPIRATORY:  Clear to auscultation without rales, wheezing or rhonchi  MUSCULOSKELETAL:  No edema; No deformity  SKIN: Warm and dry NEUROLOGIC:  Alert and oriented x 3 PSYCHIATRIC:  Normal affect    ASSESSMENT:    1. Coronary artery disease involving native heart without angina pectoris, unspecified vessel or lesion type   2. Essential hypertension, benign   3. Resistant hypertension   4. Daytime sleepiness   5. History of snoring   6. Hypothyroidism, unspecified type   7. Fatigue, unspecified type   8. Hyperlipidemia, unspecified hyperlipidemia type   9. Statin intolerance   10. Chronic renal failure, stage 3a ([place])   11. Other migraine without status migrainosus, not intractable    PLAN:    In order of problems listed above:  CAD, s/p NSTEMI Stable with no anginal symptoms. No indication for ischemic evaluation. Normal Lexiscan in 2017. Heart healthy diet and regular cardiovascular exercise encouraged. ED precautions discussed.  Continue current medication regimen.  HTN, resistant hypertension Initial BP on arrival 150/96, repeat BP 140/85. Similar BP readings at home. Unable to tolerate lisinopril and amlodipine. No evidence of renal artery stenosis. Discussed to monitor BP at home at least 2 hours after medications and sitting for 5-10 minutes. BP log given today and recommended to obtain Omron cuff. Will increase Carvedilol and and continue aldactone.  Previous referral made to Advanced HTN clinic, has upcoming appt next month. Heart healthy diet and regular cardiovascular exercise encouraged. Will await split night sleep study to be scheduled - see below.   3. Daytime sleepiness, hx of snoring STOP-Bang score 3 today. Says she has not had sleep study performed before. Pt is awaiting scheduling split night sleep study. Heart healthy diet and regular cardiovascular exercise encouraged.   Hypothyroidism, fatigue Recent TSH 6.530, T3 and T4 normal. Continue Synthroid.  Continue to follow with PCP who is managing this medication. Heart healthy diet and regular cardiovascular exercise encouraged.   HLD, statin intolerance Recent labs revealed total cholesterol 172, HDL  51, LDL  106, triglycerides 78.  She has a history of statin intolerance.  She has requested to work on diet and exercise before starting on medication.  Heart healthy diet and regular cardiovascular exercise encouraged.  Plan to repeat FLP in 6 months.  If LDL is still elevated at that time, plan to have discussion about starting Repatha/Praluent or Zetia.   Stage IIIa chronic kidney disease (sees kidney doctor) Most recent serum creatinine 1.25 with eGFR at 45.  She currently follows a kidney doctor.  Avoid nephrotoxic agents.  Encouraged to stay adequately well-hydrated.  6. Headaches Explains frequent episodes of what sounds like migraine headaches, difficult to manage medically. Denies any red flag signs or symptoms. Will place referral to Neurology.     7. Disposition: Follow up with Dr. Oval Linsey as scheduled. Follow-up with Dr. [name] or me in 4-5 months or sooner if anything changes.  Medication Adjustments/Labs and Tests Ordered: Current medicines are reviewed at length with the patient today.  Concerns regarding medicines are outlined above.  No orders of the defined types were placed in this encounter.  Meds ordered this encounter  Medications   carvedilol (COREG) 25 MG tablet    Sig: Take 1.5 tablets (37.5 mg total) by mouth 2 (two) times daily.    Dispense:  270 tablet    Refill:  1    Patient Instructions  Medication Instructions:  Your physician has recommended you make the following change in your medication:  Increase Coreg to 1.5 tablets twice daily   Labwork: None  Testing/Procedures: None  Follow-Up: Follow up with Dr. [name] or Finis Bud, NP in 4-5 months. Keep your scheduled follow up with Dr. Oval Linsey.   Any Other Special Instructions Will Be Listed Below (If Applicable).  You have been referred to Neurology. They will contact you with your first appointment.    If you need a refill on your cardiac medications before your next appointment, please call  your pharmacy.   Your physician has requested that you regularly monitor and record your blood pressure readings at home. Please use the same machine at the same time of day to check your readings and record them to bring to your follow-up visit.    Signed, Finis Bud, NP  03/16/2022 3:43 PM    [place]

## 2022-03-16 ENCOUNTER — Encounter: Payer: Self-pay | Admitting: Nurse Practitioner

## 2022-03-16 ENCOUNTER — Encounter: Payer: Self-pay | Admitting: Neurology

## 2022-03-22 ENCOUNTER — Ambulatory Visit: Payer: Medicaid Other | Admitting: Internal Medicine

## 2022-03-23 ENCOUNTER — Other Ambulatory Visit: Payer: Self-pay | Admitting: *Deleted

## 2022-03-23 DIAGNOSIS — I10 Essential (primary) hypertension: Secondary | ICD-10-CM

## 2022-03-26 ENCOUNTER — Encounter: Payer: Self-pay | Admitting: Internal Medicine

## 2022-03-26 ENCOUNTER — Ambulatory Visit (INDEPENDENT_AMBULATORY_CARE_PROVIDER_SITE_OTHER): Payer: Medicare HMO | Admitting: Internal Medicine

## 2022-03-26 VITALS — BP 186/110 | HR 64 | Ht 63.0 in | Wt 169.6 lb

## 2022-03-26 DIAGNOSIS — H6123 Impacted cerumen, bilateral: Secondary | ICD-10-CM | POA: Diagnosis not present

## 2022-03-26 DIAGNOSIS — I10 Essential (primary) hypertension: Secondary | ICD-10-CM | POA: Diagnosis not present

## 2022-03-26 MED ORDER — CHLORTHALIDONE 25 MG PO TABS: 25.0000 mg | ORAL_TABLET | Freq: Every day | ORAL | 1 refills | Status: DC

## 2022-03-26 MED ORDER — MINOXIDIL 10 MG PO TABS: 10.0000 mg | ORAL_TABLET | Freq: Every day | ORAL | 2 refills | Status: DC

## 2022-03-26 NOTE — Patient Instructions (Signed)
It was a pleasure to see you today.  Thank you for giving [place] the opportunity to be involved in your care.  Below is a brief recap of your visit and next steps.  We will plan to see you again in 2 weeks.  Summary Increase minoxidil to 10 mg daily (one whole tablet) Increase chlorthalidone to 25 mg daily (one whole tablet) Follow up in 2 weeks

## 2022-03-26 NOTE — Progress Notes (Unsigned)
Established Patient Office Visit  Subjective   Patient ID: [name] [name], female    DOB: 06/20/1945  Age: 77 y.o. MRN: [id number]  Chief Complaint  Patient presents with   Hypertension    Follow up   Ms. Patla returns to care today for HTN follow-up.  She was last seen at [facility] on 1/9 as a new patient presenting to establish care.  Her blood pressure was significantly elevated at that time.  2-week follow-up was arranged for HTN.  In the interim she has been seen by hematology for follow-up of polycythemia vera as well as cardiology for follow-up of CAD.  There have otherwise been no acute interval events.  Ms. Birman reports feeling well today.  She is asymptomatic and has no acute concerns to discuss.  Past Medical History:  Diagnosis Date   Anxiety    Dysphagia 09/03/2020   Essential hypertension    HLD (hyperlipidemia)    Hx of statin intolerance   Hypothyroidism    NSTEMI (non-ST elevated myocardial infarction) ([place]) 2015   a.  Related to occlusion of the distal Cx. The mid to distal OM1 also contains a significant stenosis that is best treated with medical therapy. Patent RCA/LAD with tortuosity. LM patent. EF 55%.    Polycythemia vera ([place])    Stage 3b chronic kidney disease    Past Surgical History:  Procedure Laterality Date   ABDOMINAL HYSTERECTOMY     CARDIAC CATHETERIZATION  ~ 2000   CORONARY ANGIOPLASTY  09/20/2013   LEFT HEART CATHETERIZATION WITH CORONARY ANGIOGRAM N/A 09/20/2013   Procedure: LEFT HEART CATHETERIZATION WITH CORONARY ANGIOGRAM;  Surgeon: Sinclair Grooms, MD;  Location: [facility] CATH LAB;  Service: Cardiovascular;  Laterality: N/A;   LEFT HEART CATHETERIZATION WITH CORONARY ANGIOGRAM N/A 09/27/2013   Procedure: LEFT HEART CATHETERIZATION WITH CORONARY ANGIOGRAM;  Surgeon: Blane Ohara, MD;  Location: [facility] CATH LAB;  Service: Cardiovascular;  Laterality: N/A;   THYROIDECTOMY  10/08/2011   Procedure: THYROIDECTOMY;  Surgeon: [name], MD;   Location: [facility] ORS;  Service: General;  Laterality: N/A;  Total   VAGINAL HYSTERECTOMY  1993   Social History   Tobacco Use   Smoking status: Never   Smokeless tobacco: Never  Vaping Use   Vaping Use: Never used  Substance Use Topics   Alcohol use: No    Alcohol/week: 0.0 standard drinks of alcohol   Drug use: No   Family History  Problem Relation Age of Onset   Colon cancer Father    Thyroid disease Neg Hx    Allergies  Allergen Reactions   Fluviral [Influenza Virus Vaccine] Shortness Of Breath, Swelling and Other (See Comments)    Including back pain that radiates in upper extremeties   Haemophilus Influenzae Other (See Comments), Shortness Of Breath and Swelling    Including back pain that radiates in upper extremeties   Amlodipine     Lip swelling per patient.    Lisinopril     Dr. Legrand Rams stopped due to side effects per patient.  Could remember what side effects were.     Ranexa [Ranolazine]     Chest pain   Review of Systems  Constitutional:  Negative for chills and fever.  HENT:  Negative for sore throat.   Respiratory:  Negative for cough and shortness of breath.   Cardiovascular:  Negative for chest pain, palpitations and leg swelling.  Gastrointestinal:  Negative for abdominal pain, blood in stool, constipation, diarrhea, nausea and vomiting.  Genitourinary:  Negative for dysuria and hematuria.  Musculoskeletal:  Negative for myalgias.  Skin:  Negative for itching and rash.  Neurological:  Negative for dizziness and headaches.  Psychiatric/Behavioral:  Negative for depression and suicidal ideas.      Objective:     BP (!) 186/110   Pulse 64   Ht 5' 3"$  (1.6 m)   Wt 169 lb 9.6 oz (76.9 kg)   SpO2 96%   BMI 30.04 kg/m  BP Readings from Last 3 Encounters:  03/30/22 (!) 165/89  03/26/22 (!) 186/110  03/15/22 (!) 140/85   Physical Exam Vitals reviewed.  Constitutional:      General: She is not in acute distress.    Appearance: Normal appearance. She is  not toxic-appearing.  HENT:     Head: Normocephalic and atraumatic.     Right Ear: External ear normal. There is impacted cerumen.     Left Ear: External ear normal. There is impacted cerumen.     Nose: Nose normal. No congestion or rhinorrhea.     Mouth/Throat:     Mouth: Mucous membranes are moist.     Pharynx: Oropharynx is clear. No oropharyngeal exudate or posterior oropharyngeal erythema.  Eyes:     General: No scleral icterus.    Extraocular Movements: Extraocular movements intact.     Conjunctiva/sclera: Conjunctivae normal.     Pupils: Pupils are equal, round, and reactive to light.  Cardiovascular:     Rate and Rhythm: Normal rate and regular rhythm.     Pulses: Normal pulses.     Heart sounds: Normal heart sounds. No murmur heard.    No friction rub. No gallop.  Pulmonary:     Effort: Pulmonary effort is normal.     Breath sounds: Normal breath sounds. No wheezing, rhonchi or rales.  Abdominal:     General: Abdomen is flat. Bowel sounds are normal. There is no distension.     Palpations: Abdomen is soft.     Tenderness: There is no abdominal tenderness.  Musculoskeletal:        General: No swelling. Normal range of motion.     Cervical back: Normal range of motion.     Right lower leg: No edema.     Left lower leg: No edema.  Lymphadenopathy:     Cervical: No cervical adenopathy.  Skin:    General: Skin is warm and dry.     Capillary Refill: Capillary refill takes less than 2 seconds.     Coloration: Skin is not jaundiced.  Neurological:     General: No focal deficit present.     Mental Status: She is alert and oriented to person, place, and time.  Psychiatric:        Mood and Affect: Mood normal.        Behavior: Behavior normal.   Last CBC Lab Results  Component Value Date   WBC 5.5 03/30/2022   HGB 17.4 (H) 03/30/2022   HCT 53.4 (H) 03/30/2022   MCV 92.1 03/30/2022   MCH 30.0 03/30/2022   RDW 19.1 (H) 03/30/2022   PLT 401 (H) 0000000   Last  metabolic panel Lab Results  Component Value Date   GLUCOSE 101 (H) 03/30/2022   NA 140 03/30/2022   K 4.3 03/30/2022   CL 104 03/30/2022   CO2 27 03/30/2022   BUN 18 03/30/2022   CREATININE 1.07 (H) 03/30/2022   GFRNONAA 54 (L) 03/30/2022   CALCIUM 9.0 03/30/2022   PHOS 3.4 03/30/2022   PROT 7.5  02/19/2022   ALBUMIN 3.9 03/30/2022   BILITOT 0.6 02/19/2022   ALKPHOS 46 02/19/2022   AST 26 02/19/2022   ALT 23 02/19/2022   ANIONGAP 9 03/30/2022   Last lipids Lab Results  Component Value Date   CHOL 172 01/28/2022   HDL 51 01/28/2022   LDLCALC 106 (H) 01/28/2022   TRIG 78 01/28/2022   CHOLHDL 3.4 01/28/2022   Last hemoglobin A1c Lab Results  Component Value Date   HGBA1C 6.2 (H) 09/26/2013   Last thyroid functions Lab Results  Component Value Date   TSH 6.530 (H) 01/28/2022   T4TOTAL 5.4 09/04/2019   T4TOTAL 5.9 09/04/2019     Assessment & Plan:   Problem List Items Addressed This Visit       Essential hypertension - Primary    Remains poorly controlled.  BP 192/102 initially and 186/110 on repeat.  She has recently been referred to the advanced HTN clinic.  Her current antihypertensive regimen includes carvedilol 37.5 mg twice daily, chlorthalidone 12.5 mg daily, minoxidil 5 mg daily, and spironolactone 25 mg daily.  Carvedilol was recently increased at cardiology follow-up. -Increase chlorthalidone to 25 mg daily and increase minoxidil to 10 mg daily -Continue carvedilol and spironolactone at current doses -Follow-up in 2 weeks for HTN      Bilateral impacted cerumen    Noted on exam today.  Resolved with thorough irrigation.      Return in about 2 weeks (around 04/09/2022) for HTN.   [name], MD

## 2022-03-30 ENCOUNTER — Inpatient Hospital Stay: Payer: Medicare HMO | Attending: Hematology | Admitting: Physician Assistant

## 2022-03-30 ENCOUNTER — Other Ambulatory Visit: Payer: Self-pay

## 2022-03-30 ENCOUNTER — Inpatient Hospital Stay: Payer: Medicare HMO

## 2022-03-30 VITALS — BP 165/89 | HR 88 | Temp 98.8°F | Resp 18 | Ht 63.0 in | Wt 172.7 lb

## 2022-03-30 DIAGNOSIS — D45 Polycythemia vera: Secondary | ICD-10-CM

## 2022-03-30 DIAGNOSIS — I129 Hypertensive chronic kidney disease with stage 1 through stage 4 chronic kidney disease, or unspecified chronic kidney disease: Secondary | ICD-10-CM | POA: Insufficient documentation

## 2022-03-30 DIAGNOSIS — Z90710 Acquired absence of both cervix and uterus: Secondary | ICD-10-CM | POA: Diagnosis not present

## 2022-03-30 DIAGNOSIS — N1832 Chronic kidney disease, stage 3b: Secondary | ICD-10-CM | POA: Insufficient documentation

## 2022-03-30 DIAGNOSIS — R519 Headache, unspecified: Secondary | ICD-10-CM | POA: Diagnosis not present

## 2022-03-30 DIAGNOSIS — Z1589 Genetic susceptibility to other disease: Secondary | ICD-10-CM

## 2022-03-30 DIAGNOSIS — Z800 Family history of malignant neoplasm of digestive organs: Secondary | ICD-10-CM | POA: Insufficient documentation

## 2022-03-30 DIAGNOSIS — D751 Secondary polycythemia: Secondary | ICD-10-CM

## 2022-03-30 DIAGNOSIS — I10 Essential (primary) hypertension: Secondary | ICD-10-CM

## 2022-03-30 DIAGNOSIS — D75839 Thrombocytosis, unspecified: Secondary | ICD-10-CM

## 2022-03-30 LAB — CBC WITH DIFFERENTIAL/PLATELET
Abs Immature Granulocytes: 0.01 10*3/uL (ref 0.00–0.07)
Basophils Absolute: 0 10*3/uL (ref 0.0–0.1)
Basophils Relative: 1 %
Eosinophils Absolute: 0.2 10*3/uL (ref 0.0–0.5)
Eosinophils Relative: 3 %
HCT: 53.4 % — ABNORMAL HIGH (ref 36.0–46.0)
Hemoglobin: 17.4 g/dL — ABNORMAL HIGH (ref 12.0–15.0)
Immature Granulocytes: 0 %
Lymphocytes Relative: 35 %
Lymphs Abs: 2 10*3/uL (ref 0.7–4.0)
MCH: 30 pg (ref 26.0–34.0)
MCHC: 32.6 g/dL (ref 30.0–36.0)
MCV: 92.1 fL (ref 80.0–100.0)
Monocytes Absolute: 0.3 10*3/uL (ref 0.1–1.0)
Monocytes Relative: 5 %
Neutro Abs: 3.1 10*3/uL (ref 1.7–7.7)
Neutrophils Relative %: 56 %
Platelets: 401 10*3/uL — ABNORMAL HIGH (ref 150–400)
RBC: 5.8 MIL/uL — ABNORMAL HIGH (ref 3.87–5.11)
RDW: 19.1 % — ABNORMAL HIGH (ref 11.5–15.5)
WBC: 5.5 10*3/uL (ref 4.0–10.5)
nRBC: 0 % (ref 0.0–0.2)

## 2022-03-30 LAB — RENAL FUNCTION PANEL
Albumin: 3.9 g/dL (ref 3.5–5.0)
Anion gap: 9 (ref 5–15)
BUN: 18 mg/dL (ref 8–23)
CO2: 27 mmol/L (ref 22–32)
Calcium: 9 mg/dL (ref 8.9–10.3)
Chloride: 104 mmol/L (ref 98–111)
Creatinine, Ser: 1.07 mg/dL — ABNORMAL HIGH (ref 0.44–1.00)
GFR, Estimated: 54 mL/min — ABNORMAL LOW (ref >60)
Glucose, Bld: 101 mg/dL — ABNORMAL HIGH (ref 70–99)
Phosphorus: 3.4 mg/dL (ref 2.5–4.6)
Potassium: 4.3 mmol/L (ref 3.5–5.1)
Sodium: 140 mmol/L (ref 135–145)

## 2022-03-30 LAB — PROTEIN / CREATININE RATIO, URINE
Creatinine, Urine: 99 mg/dL
Total Protein, Urine: <6 mg/dL

## 2022-03-30 NOTE — Progress Notes (Signed)
[address] [place], [place] [address]   CLINIC:  Medical Oncology/Hematology  PCP:  [name], MD [place] [address] [phone]   REASON FOR VISIT:  Follow-up for JAK2 polycythemia vera  PRIOR THERAPY: None  CURRENT THERAPY: Hydrea 500 mg daily + aspirin 81 mg  INTERVAL HISTORY:   Ms. Brownfield 77 y.o. female returns for routine follow-up of her polycythemia vera.  She was last seen by Tarri Abernethy PA-C on 02/19/2022.  At today's visit, she reports feeling good.  No recent hospitalizations, surgeries, or changes in baseline health status.  At last visit, Hydrea was increased to 1000 mg daily.  She reports that she takes this "most days," but admits to skipping it once or twice a week due to headache that she feels is made worse by her Hydrea.  (Of note, CBC does not show any of the expected macrocytosis.)  Otherwise, she is tolerating her Hydrea fairly well.  She denies any significant increase in fatigue.   No mouth sores, skin sores, or GI upset. She continues to have ongoing headaches and tinnitus.  No aquagenic pruritus or erythromelalgia.  No B symptoms such as fever, night sweats, or weight loss.   She reports sinus infections 1-2 times a year, but no other infections.   She is not taking her aspirin 81 mg daily, because she "did her own research and knows that it could give her a heart attack."  She has 85% energy and 100% appetite. She endorses that she is maintaining a stable weight.   ASSESSMENT & PLAN:  1.   JAK2 positive polycythemia vera - Seen at request of Dr. Theador Hawthorne for elevated hemoglobin and platelet count - Review of EMR shows platelet count elevated since August 2015,  Hemoglobin elevated since January 2019 - Symptomatic with persistent headaches and tinnitus.  No aquagenic pruritus or erythromelalgia.  No B symptoms. - No history of DVT, PE, MI, or CVA. - She is a lifelong non-smoker and  denies any chemical exposure or known history of sleep apnea. - Hematology work-up (11/20/2021): JAK2 [id number] positive Low erythropoietin 1.8, in keeping with polycythemia vera BCR ABL FISH negative.  ESR and CRP negative. - Reviewed CBC (11/20/2021): Hgb 16.9/HCT 51.1, platelets 613 - Hydrea started on 01/14/2022, current dose 1000 mg daily, but she reports that she skips a day sometimes due to headache.  (CBC does NOT show expected macrocytosis) - Aspirin 81 mg recommended with extensive discussion regarding medical indication for antiplatelet agents.  Patient refuses to take aspirin because she "did her own research and knows that it could give her a heart attack" - Labs today (03/30/2022): Platelets are improved at 401, but she has persistent elevation in hemoglobin at 17.4 with hematocrit 53.4. - Extensive discussion with patient (and her daughter) regarding diagnosis of polycythemia vera, including risk of thrombosis (CVA, MI, DVT, PE) as well as risk of myelofibrosis and leukemia in some patients. - Based on her age, she is considered high risk and therefore recommended cytoreductive therapy with hydroxyurea.  We discussed potential side effects of hydroxyurea.  Although patient was very hesitant and anxious about starting Hydrea, she agreed to do so as we have extensively discussed that the risks of not receiving treatment for PV would be high risk of thrombosis and fatality within the next 1 to 2 years. - PLAN: Blood counts are NOT at goal.  Patient hesitant to increase Hydrea due to worsening headaches  that she feels is a side effect of the medication.  Discussed that her headaches may be related to her elevated hemoglobin/hematocrit and that Hydrea would help this in the long run. - Patient will continue on Hydrea 1000 mg daily. - Recommend phlebotomy x 2 for additional control of elevated hemoglobin/hematocrit - Same-day labs (CBC/D, CMP, LDH) and office visit in 4 weeks   2.  Social/family  history: - Lives at home with her family.  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  She was never smoker. - No family history of leukemias or malignancies.   PLAN SUMMARY: >> Weekly phlebotomy x 2 >> Same-day labs (CBC/D, CMP, LDH) + OFFICE visit in 4 weeks    REVIEW OF SYSTEMS:  Review of Systems  Constitutional:  Negative for appetite change, chills, diaphoresis, fatigue, fever and unexpected weight change.  HENT:   Positive for tinnitus. Negative for lump/mass and nosebleeds.   Eyes:  Negative for eye problems.  Respiratory:  Negative for cough, hemoptysis and shortness of breath.   Cardiovascular:  Negative for chest pain, leg swelling and palpitations.  Gastrointestinal:  Negative for abdominal pain, blood in stool, constipation, diarrhea, nausea and vomiting.  Genitourinary:  Negative for hematuria.   Musculoskeletal:  Positive for back pain.  Skin: Negative.   Neurological:  Positive for headaches. Negative for dizziness and light-headedness.  Hematological:  Does not bruise/bleed easily.  Psychiatric/Behavioral:  Positive for sleep disturbance.      PHYSICAL EXAM:  ECOG PERFORMANCE STATUS: 1 - Symptomatic but completely ambulatory There were no vitals filed for this visit. There were no vitals filed for this visit. Physical Exam Constitutional:      Appearance: Normal appearance. She is obese.  HENT:     Head: Normocephalic and atraumatic.     Mouth/Throat:     Mouth: Mucous membranes are moist.  Eyes:     Extraocular Movements: Extraocular movements intact.     Pupils: Pupils are equal, round, and reactive to light.  Cardiovascular:     Rate and Rhythm: Normal rate and regular rhythm.     Pulses: Normal pulses.     Heart sounds: Normal heart sounds.  Pulmonary:     Effort: Pulmonary effort is normal.     Breath sounds: Normal breath sounds.  Abdominal:     General: Bowel sounds are normal.      Palpations: Abdomen is soft.     Tenderness: There is no abdominal tenderness.  Musculoskeletal:        General: No swelling.     Right lower leg: No edema.     Left lower leg: No edema.  Lymphadenopathy:     Cervical: No cervical adenopathy.  Skin:    General: Skin is warm and dry.  Neurological:     General: No focal deficit present.     Mental Status: She is alert and oriented to person, place, and time.  Psychiatric:        Mood and Affect: Mood normal.        Behavior: Behavior normal.     PAST MEDICAL/SURGICAL HISTORY:  Past Medical History:  Diagnosis Date   Anxiety    Dysphagia 09/03/2020   Essential hypertension    HLD (hyperlipidemia)    Hx of statin intolerance   Hypothyroidism    NSTEMI (non-ST elevated myocardial infarction) ([facility]) 2015   a.  Related to occlusion of the distal Cx. The mid to distal  OM1 also contains a significant stenosis that is best treated with medical therapy. Patent RCA/LAD with tortuosity. LM patent. EF 55%.    Polycythemia vera ([facility])    Stage 3b chronic kidney disease    Past Surgical History:  Procedure Laterality Date   ABDOMINAL HYSTERECTOMY     CARDIAC CATHETERIZATION  ~ 2000   CORONARY ANGIOPLASTY  09/20/2013   LEFT HEART CATHETERIZATION WITH CORONARY ANGIOGRAM N/A 09/20/2013   Procedure: LEFT HEART CATHETERIZATION WITH CORONARY ANGIOGRAM;  Surgeon: Sinclair Grooms, MD;  Location: [facility] C[facility] LAB;  Service: Cardiovascular;  Laterality: N/A;   LEFT HEART CATHETERIZATION WITH CORONARY ANGIOGRAM N/A 09/27/2013   Procedure: LEFT HEART CATHETERIZATION WITH CORONARY ANGIOGRAM;  Surgeon: Blane Ohara, MD;  Location: [facility] CATH LAB;  Service: Cardiovascular;  Laterality: N/A;   THYROIDECTOMY  10/08/2011   Procedure: THYROIDECTOMY;  Surgeon: [name], MD;  Location: [facility] ORS;  Service: General;  Laterality: N/A;  Total   VAGINAL HYSTERECTOMY  1993    SOCIAL HISTORY:  Social History   Socioeconomic History   Marital status: Widowed     Spouse name: Not on file   Number of children: Not on file   Years of education: Not on file   Highest education level: Not on file  Occupational History   Not on file  Tobacco Use   Smoking status: Never   Smokeless tobacco: Never  Vaping Use   Vaping Use: Never used  Substance and Sexual Activity   Alcohol use: No    Alcohol/week: 0.0 standard drinks of alcohol   Drug use: No   Sexual activity: Never  Other Topics Concern   Not on file  Social History Narrative   ** Merged History Encounter **  Lives at home with family.  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      Social Determinants of Health   Financial Resource Strain: Low Risk  (07/21/2020)   Overall Financial Resource Strain (CARDIA)    Difficulty of Paying Living Expenses: Not very hard  Food Insecurity: No Food Insecurity (07/21/2020)   Hunger Vital Sign    Worried About Running Out of Food in the Last Year: Never true    Ran Out of Food in the Last Year: Never true  Transportation Needs: No Transportation Needs (09/08/2020)   PRAPARE - Hydrologist (Medical): No    Lack of Transportation (Non-Medical): No  Physical Activity: Sufficiently Active (03/11/2020)   Exercise Vital Sign    Days of Exercise per Week: 5 days    Minutes of Exercise per Session: 30 min  Stress: No Stress Concern Present (03/11/2020)   [place]    Feeling of Stress : Not at all  Social Connections: Not on file  Intimate Partner Violence: Not on file    FAMILY HISTORY:  Family History  Problem Relation Age of Onset   Colon cancer Father    Thyroid disease Neg Hx     CURRENT MEDICATIONS:  Outpatient Encounter Medications as of 03/30/2022  Medication Sig   aspirin EC 81 MG tablet Take 1 tablet (81 mg total) by mouth daily. Swallow whole.   carvedilol (COREG) 25 MG tablet Take 1.5 tablets (37.5 mg total) by mouth 2 (two) times daily.   chlorthalidone  (HYGROTON) 25 MG tablet Take 1 tablet (25 mg total) by mouth daily.   hydroxyurea (HYDREA) 500 MG capsule Take 2 capsules (1,000 mg total) by mouth daily. May  take with food to minimize GI side effects.   levothyroxine (SYNTHROID) 125 MCG tablet Take 1 tablet (125 mcg total) by mouth daily before breakfast. 1 HOUR PRIOR TO OTHER MEDICATIONS   minoxidil (LONITEN) 10 MG tablet Take 1 tablet (10 mg total) by mouth daily.   spironolactone (ALDACTONE) 25 MG tablet Take 1 tablet (25 mg total) by mouth daily.   No facility-administered encounter medications on file as of 03/30/2022.    ALLERGIES:  Allergies  Allergen Reactions   Fluviral [Influenza Virus Vaccine] Shortness Of Breath, Swelling and Other (See Comments)    Including back pain that radiates in upper extremeties   Haemophilus Influenzae Other (See Comments), Shortness Of Breath and Swelling    Including back pain that radiates in upper extremeties   Amlodipine     Lip swelling per patient.    Lisinopril     Dr. Legrand Rams stopped due to side effects per patient.  Could remember what side effects were.     Ranexa [Ranolazine]     Chest pain    LABORATORY DATA:  I have reviewed the labs as listed.  CBC    Component Value Date/Time   WBC 5.1 02/19/2022 1016   RBC 5.86 (H) 02/19/2022 1016   HGB 17.5 (H) 02/19/2022 1016   HCT 53.6 (H) 02/19/2022 1016   PLT 459 (H) 02/19/2022 1016   MCV 91.5 02/19/2022 1016   MCH 29.9 02/19/2022 1016   MCHC 32.6 02/19/2022 1016   RDW 18.9 (H) 02/19/2022 1016   LYMPHSABS 1.6 02/19/2022 1016   MONOABS 0.3 02/19/2022 1016   EOSABS 0.3 02/19/2022 1016   BASOSABS 0.0 02/19/2022 1016      Latest Ref Rng & Units 02/19/2022   10:16 AM 01/14/2022   10:52 AM 02/07/2021    4:50 AM  CMP  Glucose 70 - 99 mg/dL 86  91  97   BUN 8 - 23 mg/dL 25  19  27   $ Creatinine 0.44 - 1.00 mg/dL 1.25  1.48  1.34   Sodium 135 - 145 mmol/L 139  135  137   Potassium 3.5 - 5.1 mmol/L 4.4  4.2  3.9   Chloride 98 - 111  mmol/L 106  100  104   CO2 22 - 32 mmol/L 24  26  24   $ Calcium 8.9 - 10.3 mg/dL 9.2  9.3  9.2   Total Protein 6.5 - 8.1 g/dL 7.5  8.0    Total Bilirubin 0.3 - 1.2 mg/dL 0.6  0.7    Alkaline Phos 38 - 126 U/L 46  50    AST 15 - 41 U/L 26  31    ALT 0 - 44 U/L 23  29      DIAGNOSTIC IMAGING:  I have independently reviewed the relevant imaging and discussed with the patient.   WRAP UP:  All questions were answered. The patient knows to call the clinic with any problems, questions or concerns.  Medical decision making: Moderate  Time spent on visit: I spent 20 minutes counseling the patient face to face. The total time spent in the appointment was 30 minutes and more than 50% was on counseling.  Harriett Rush, PA-C  03/30/2022 12:09 PM

## 2022-03-30 NOTE — Patient Instructions (Signed)
[place] [facility] [place] **   You were seen today by Tarri Abernethy PA-C for your elevated red blood cells and elevated platelets.  This is due to a genetic mutation (called JAK2) that causes a blood condition called "polycythemia vera" where your bone marrow makes too many blood cells. Having too many blood cells places you at increased risk of blood clots, heart attack, and stroke (due to your blood being too thick). The best treatment for this is a medication called Hydrea (hydroxyurea) which decreases the amount of blood cells made by your bone marrow. It is also recommended that you take aspirin 81 mg daily to make your blood less sticky and decrease your risk of blood clots. We will schedule you for phlebotomy ("blood donation") x 2 to try to bring your blood counts back to a safer level. You should continue your Hydrea 1000 mg (2 capsules) once a day and aspirin 81 mg once a day. We will check blood counts and see you for an office visit in 4 weeks.  ** Thank you for trusting me with your healthcare!  I strive to provide all of my patients with quality care at each visit.  If you receive a survey for this visit, I would be so grateful to you for taking the time to provide feedback.  Thank you in advance!  ~ Kali Ambler                   Dr. Derek Jack   &   Tarri Abernethy, PA-C   - - - - - - - - - - - - - - - - - -    Thank you for choosing [facility] to provide your oncology and hematology care.  To afford each patient quality time with our provider, please arrive at least 15 minutes before your scheduled appointment time.   If you have a lab appointment with the [facility] please come in thru the Main Entrance and check in at the main information desk.  You need to re-schedule your appointment should you arrive 10 or more minutes late.  We strive to give you quality  time with our providers, and arriving late affects you and other patients whose appointments are after yours.  Also, if you no show three or more times for appointments you may be dismissed from the clinic at the providers discretion.     Again, thank you for choosing [facility].  Our hope is that these requests will decrease the amount of time that you wait before being seen by our physicians.       _____________________________________________________________  Should you have questions after your visit to [facility], please contact our office at [phone] and follow the prompts.  Our office hours are 8:00 a.m. and 4:30 p.m. Monday - Friday.  Please note that voicemails left after 4:00 p.m. may not be returned until the following business day.  We are closed weekends and major holidays.  You do have access to a nurse 24-7, just call the main number to the clinic [phone] and do not press any options, hold on the line and a nurse will answer the phone.    For prescription refill requests, have your pharmacy contact our office and allow 72 hours.

## 2022-03-31 DIAGNOSIS — D45 Polycythemia vera: Secondary | ICD-10-CM | POA: Diagnosis not present

## 2022-03-31 DIAGNOSIS — I129 Hypertensive chronic kidney disease with stage 1 through stage 4 chronic kidney disease, or unspecified chronic kidney disease: Secondary | ICD-10-CM | POA: Diagnosis not present

## 2022-03-31 DIAGNOSIS — N1831 Chronic kidney disease, stage 3a: Secondary | ICD-10-CM | POA: Diagnosis not present

## 2022-03-31 DIAGNOSIS — I5032 Chronic diastolic (congestive) heart failure: Secondary | ICD-10-CM | POA: Diagnosis not present

## 2022-04-01 DIAGNOSIS — H6123 Impacted cerumen, bilateral: Secondary | ICD-10-CM | POA: Insufficient documentation

## 2022-04-01 NOTE — Assessment & Plan Note (Signed)
Noted on exam today.  Resolved with thorough irrigation.

## 2022-04-01 NOTE — Assessment & Plan Note (Signed)
Remains poorly controlled.  BP 192/102 initially and 186/110 on repeat.  She has recently been referred to the advanced HTN clinic.  Her current antihypertensive regimen includes carvedilol 37.5 mg twice daily, chlorthalidone 12.5 mg daily, minoxidil 5 mg daily, and spironolactone 25 mg daily.  Carvedilol was recently increased at cardiology follow-up. -Increase chlorthalidone to 25 mg daily and increase minoxidil to 10 mg daily -Continue carvedilol and spironolactone at current doses -Follow-up in 2 weeks for HTN

## 2022-04-07 ENCOUNTER — Inpatient Hospital Stay: Payer: Medicare HMO

## 2022-04-07 DIAGNOSIS — N1832 Chronic kidney disease, stage 3b: Secondary | ICD-10-CM | POA: Diagnosis not present

## 2022-04-07 DIAGNOSIS — Z90710 Acquired absence of both cervix and uterus: Secondary | ICD-10-CM | POA: Diagnosis not present

## 2022-04-07 DIAGNOSIS — D45 Polycythemia vera: Secondary | ICD-10-CM | POA: Diagnosis not present

## 2022-04-07 DIAGNOSIS — Z800 Family history of malignant neoplasm of digestive organs: Secondary | ICD-10-CM | POA: Diagnosis not present

## 2022-04-07 DIAGNOSIS — R519 Headache, unspecified: Secondary | ICD-10-CM | POA: Diagnosis not present

## 2022-04-07 DIAGNOSIS — I129 Hypertensive chronic kidney disease with stage 1 through stage 4 chronic kidney disease, or unspecified chronic kidney disease: Secondary | ICD-10-CM | POA: Diagnosis not present

## 2022-04-07 NOTE — Progress Notes (Signed)
[name] [name] presents today for phlebotomy per MD orders. Phlebotomy procedure started at 1455 and ended at 1545. 515 cc removed using a 22g secondary phlebotomy kit in the Right AC.  Patient tolerated procedure well. IV needle removed intact.  Patient provided with nutrition. Observed 20 minutes post procedure. VSS at discharge

## 2022-04-07 NOTE — Progress Notes (Signed)
Information brought in today by the patient regarding PTG-300, [place]. Reviewed information both with Dr. [name] and Tarri Abernethy, PA-C and this medication is not currently on the market but is in the clinical trial phase. Reviewed clinicaltrials.gov and noted there is clinical trial availability at [facility] and [facility]. I met with the patient today and discussed the above information. She would like to proceed with trying Hydrea and Therapeutic Phlebotomies as discussed and scheduled at her last visit with Tarri Abernethy, PA-C. Patient states that she will let us know at her next office visit if she wishes to seek a second opinion and evaluation at a clinical trial site. Tarri Abernethy, PA-C aware of the above conversation. Patient to follow-up as previously scheduled.

## 2022-04-07 NOTE — Patient Instructions (Signed)

## 2022-04-12 ENCOUNTER — Other Ambulatory Visit: Payer: Self-pay | Admitting: *Deleted

## 2022-04-12 DIAGNOSIS — D45 Polycythemia vera: Secondary | ICD-10-CM

## 2022-04-12 MED ORDER — HYDROXYUREA 500 MG PO CAPS: 1000.0000 mg | ORAL_CAPSULE | Freq: Every day | ORAL | 6 refills | Status: DC

## 2022-04-12 NOTE — Telephone Encounter (Signed)
Refill for Hydrea approved.  Patient is tolerating and is to continue therapy.

## 2022-04-14 ENCOUNTER — Inpatient Hospital Stay: Payer: Medicare HMO | Attending: Hematology

## 2022-04-14 VITALS — BP 155/62 | HR 72 | Temp 98.2°F | Resp 18

## 2022-04-14 DIAGNOSIS — N1832 Chronic kidney disease, stage 3b: Secondary | ICD-10-CM | POA: Insufficient documentation

## 2022-04-14 DIAGNOSIS — Z800 Family history of malignant neoplasm of digestive organs: Secondary | ICD-10-CM | POA: Insufficient documentation

## 2022-04-14 DIAGNOSIS — I129 Hypertensive chronic kidney disease with stage 1 through stage 4 chronic kidney disease, or unspecified chronic kidney disease: Secondary | ICD-10-CM | POA: Insufficient documentation

## 2022-04-14 DIAGNOSIS — Z90710 Acquired absence of both cervix and uterus: Secondary | ICD-10-CM | POA: Insufficient documentation

## 2022-04-14 DIAGNOSIS — D45 Polycythemia vera: Secondary | ICD-10-CM | POA: Insufficient documentation

## 2022-04-14 NOTE — Progress Notes (Signed)
  [name] [name] presents today for phlebotomy per MD orders. Phlebotomy procedure started at 1455 and ended at 1510. 506 cc removed using a 16g phlebotomy kit in the Right AC.  Patient tolerated procedure well. IV needle removed intact.   Patient provided with nutrition. Observed 20 minutes post procedure. VSS at discharge

## 2022-04-16 ENCOUNTER — Encounter: Payer: Self-pay | Admitting: Internal Medicine

## 2022-04-16 ENCOUNTER — Ambulatory Visit (INDEPENDENT_AMBULATORY_CARE_PROVIDER_SITE_OTHER): Payer: Medicare HMO | Admitting: Internal Medicine

## 2022-04-16 VITALS — BP 133/76 | HR 70 | Ht 63.0 in | Wt 177.2 lb

## 2022-04-16 DIAGNOSIS — I10 Essential (primary) hypertension: Secondary | ICD-10-CM

## 2022-04-16 NOTE — Assessment & Plan Note (Signed)
Presenting today for HTN follow-up.  Her blood pressure has significantly improved, 133/76 today.  She has been taking her medications as prescribed and has also made significant dietary changes, focusing on eating more natural foods. -No additional medication changes today -We will tentatively plan for follow-up in 3 months

## 2022-04-16 NOTE — Patient Instructions (Signed)
It was a pleasure to see you today.  Thank you for giving [place] the opportunity to be involved in your care.  Below is a brief recap of your visit and next steps.  We will plan to see you again in 3 months.  Summary No medication changes today. Your blood pressure looks great. We will plan for follow up in 3 months.

## 2022-04-16 NOTE — Progress Notes (Signed)
Established Patient Office Visit  Subjective   Patient ID: [name] [name], female    DOB: 1945/07/21  Age: 77 y.o. MRN: [id number]  Chief Complaint  Patient presents with   Hypertension    Follow up   Ms. Danner returns to care today for HTN follow-up.  She was last evaluated by me on 2/16 at which time her blood pressure remains significantly elevated (192/102 initially and 186/110 on repeat).  Chlorthalidone was increased to 25 mg daily and minoxidil was increased to 10 mg daily.  She was continued on carvedilol 37.5 mg twice daily and spironolactone 25 mg daily.  There have been no acute interval events.  Ms. Rascon reports feeling well today.  Her blood pressure has significantly improved, 133/76 today.  She has no additional concerns to discuss.  Past Medical History:  Diagnosis Date   Anxiety    Dysphagia 09/03/2020   Essential hypertension    HLD (hyperlipidemia)    Hx of statin intolerance   Hypothyroidism    NSTEMI (non-ST elevated myocardial infarction) ([place]) 2015   a.  Related to occlusion of the distal Cx. The mid to distal OM1 also contains a significant stenosis that is best treated with medical therapy. Patent RCA/LAD with tortuosity. LM patent. EF 55%.    Polycythemia vera ([place])    Stage 3b chronic kidney disease    Past Surgical History:  Procedure Laterality Date   ABDOMINAL HYSTERECTOMY     CARDIAC CATHETERIZATION  ~ 2000   CORONARY ANGIOPLASTY  09/20/2013   LEFT HEART CATHETERIZATION WITH CORONARY ANGIOGRAM N/A 09/20/2013   Procedure: LEFT HEART CATHETERIZATION WITH CORONARY ANGIOGRAM;  Surgeon: Sinclair Grooms, MD;  Location: [facility] CATH LAB;  Service: Cardiovascular;  Laterality: N/A;   LEFT HEART CATHETERIZATION WITH CORONARY ANGIOGRAM N/A 09/27/2013   Procedure: LEFT HEART CATHETERIZATION WITH CORONARY ANGIOGRAM;  Surgeon: Blane Ohara, MD;  Location: [facility] CATH LAB;  Service: Cardiovascular;  Laterality: N/A;   THYROIDECTOMY  10/08/2011   Procedure:  THYROIDECTOMY;  Surgeon: [name], MD;  Location: [facility] ORS;  Service: General;  Laterality: N/A;  Total   VAGINAL HYSTERECTOMY  1993   Social History   Tobacco Use   Smoking status: Never   Smokeless tobacco: Never  Vaping Use   Vaping Use: Never used  Substance Use Topics   Alcohol use: No    Alcohol/week: 0.0 standard drinks of alcohol   Drug use: No   Family History  Problem Relation Age of Onset   Colon cancer Father    Thyroid disease Neg Hx    Allergies  Allergen Reactions   Fluviral [Influenza Virus Vaccine] Shortness Of Breath, Swelling and Other (See Comments)    Including back pain that radiates in upper extremeties   Haemophilus Influenzae Other (See Comments), Shortness Of Breath and Swelling    Including back pain that radiates in upper extremeties   Amlodipine     Lip swelling per patient.    Lisinopril     Dr. Legrand Rams stopped due to side effects per patient.  Could remember what side effects were.     Ranexa [Ranolazine]     Chest pain   Review of Systems  Constitutional:  Negative for chills and fever.  HENT:  Negative for sore throat.   Respiratory:  Negative for cough and shortness of breath.   Cardiovascular:  Negative for chest pain, palpitations and leg swelling.  Gastrointestinal:  Negative for abdominal pain, blood in stool, constipation, diarrhea, nausea and  vomiting.  Genitourinary:  Negative for dysuria and hematuria.  Musculoskeletal:  Negative for myalgias.  Skin:  Negative for itching and rash.  Neurological:  Negative for dizziness and headaches.  Psychiatric/Behavioral:  Negative for depression and suicidal ideas.      Objective:     BP 133/76   Pulse 70   Ht '5\' 3"'$  (1.6 m)   Wt 177 lb 3.2 oz (80.4 kg)   SpO2 95%   BMI 31.39 kg/m  BP Readings from Last 3 Encounters:  04/16/22 133/76  04/14/22 (!) 155/62  04/07/22 (!) 159/92   Physical Exam Vitals reviewed.  Constitutional:      General: She is not in acute distress.     Appearance: Normal appearance. She is not toxic-appearing.  HENT:     Head: Normocephalic and atraumatic.     Right Ear: External ear normal.     Left Ear: External ear normal.     Nose: Nose normal. No congestion or rhinorrhea.     Mouth/Throat:     Mouth: Mucous membranes are moist.     Pharynx: Oropharynx is clear. No oropharyngeal exudate or posterior oropharyngeal erythema.  Eyes:     General: No scleral icterus.    Extraocular Movements: Extraocular movements intact.     Conjunctiva/sclera: Conjunctivae normal.     Pupils: Pupils are equal, round, and reactive to light.  Cardiovascular:     Rate and Rhythm: Normal rate and regular rhythm.     Pulses: Normal pulses.     Heart sounds: Normal heart sounds. No murmur heard.    No friction rub. No gallop.  Pulmonary:     Effort: Pulmonary effort is normal.     Breath sounds: Normal breath sounds. No wheezing, rhonchi or rales.  Abdominal:     General: Abdomen is flat. Bowel sounds are normal. There is no distension.     Palpations: Abdomen is soft.     Tenderness: There is no abdominal tenderness.  Musculoskeletal:        General: No swelling. Normal range of motion.     Cervical back: Normal range of motion.     Right lower leg: No edema.     Left lower leg: No edema.  Lymphadenopathy:     Cervical: No cervical adenopathy.  Skin:    General: Skin is warm and dry.     Capillary Refill: Capillary refill takes less than 2 seconds.     Coloration: Skin is not jaundiced.  Neurological:     General: No focal deficit present.     Mental Status: She is alert and oriented to person, place, and time.  Psychiatric:        Mood and Affect: Mood normal.        Behavior: Behavior normal.   Last CBC Lab Results  Component Value Date   WBC 5.5 03/30/2022   HGB 17.4 (H) 03/30/2022   HCT 53.4 (H) 03/30/2022   MCV 92.1 03/30/2022   MCH 30.0 03/30/2022   RDW 19.1 (H) 03/30/2022   PLT 401 (H) 0000000   Last metabolic panel Lab  Results  Component Value Date   GLUCOSE 101 (H) 03/30/2022   NA 140 03/30/2022   K 4.3 03/30/2022   CL 104 03/30/2022   CO2 27 03/30/2022   BUN 18 03/30/2022   CREATININE 1.07 (H) 03/30/2022   GFRNONAA 54 (L) 03/30/2022   CALCIUM 9.0 03/30/2022   PHOS 3.4 03/30/2022   PROT 7.5 02/19/2022   ALBUMIN 3.9 03/30/2022  BILITOT 0.6 02/19/2022   ALKPHOS 46 02/19/2022   AST 26 02/19/2022   ALT 23 02/19/2022   ANIONGAP 9 03/30/2022   Last lipids Lab Results  Component Value Date   CHOL 172 01/28/2022   HDL 51 01/28/2022   LDLCALC 106 (H) 01/28/2022   TRIG 78 01/28/2022   CHOLHDL 3.4 01/28/2022   Last hemoglobin A1c Lab Results  Component Value Date   HGBA1C 6.2 (H) 09/26/2013   Last thyroid functions Lab Results  Component Value Date   TSH 6.530 (H) 01/28/2022   T4TOTAL 5.4 09/04/2019   T4TOTAL 5.9 09/04/2019     Assessment & Plan:   Problem List Items Addressed This Visit       Essential hypertension - Primary    Presenting today for HTN follow-up.  Her blood pressure has significantly improved, 133/76 today.  She has been taking her medications as prescribed and has also made significant dietary changes, focusing on eating more natural foods. -No additional medication changes today -We will tentatively plan for follow-up in 3 months       Return in about 3 months (around 07/17/2022).    [name], MD

## 2022-04-19 ENCOUNTER — Encounter: Payer: Self-pay | Admitting: Internal Medicine

## 2022-04-19 ENCOUNTER — Encounter: Payer: Medicaid Other | Admitting: Internal Medicine

## 2022-04-28 ENCOUNTER — Ambulatory Visit (HOSPITAL_BASED_OUTPATIENT_CLINIC_OR_DEPARTMENT_OTHER): Payer: Medicare HMO | Admitting: Cardiovascular Disease

## 2022-04-30 ENCOUNTER — Inpatient Hospital Stay: Payer: Medicare HMO

## 2022-04-30 DIAGNOSIS — D75839 Thrombocytosis, unspecified: Secondary | ICD-10-CM

## 2022-04-30 DIAGNOSIS — D751 Secondary polycythemia: Secondary | ICD-10-CM

## 2022-04-30 DIAGNOSIS — Z800 Family history of malignant neoplasm of digestive organs: Secondary | ICD-10-CM | POA: Diagnosis not present

## 2022-04-30 DIAGNOSIS — D45 Polycythemia vera: Secondary | ICD-10-CM | POA: Diagnosis not present

## 2022-04-30 DIAGNOSIS — I129 Hypertensive chronic kidney disease with stage 1 through stage 4 chronic kidney disease, or unspecified chronic kidney disease: Secondary | ICD-10-CM | POA: Diagnosis not present

## 2022-04-30 DIAGNOSIS — Z1589 Genetic susceptibility to other disease: Secondary | ICD-10-CM

## 2022-04-30 DIAGNOSIS — N1832 Chronic kidney disease, stage 3b: Secondary | ICD-10-CM | POA: Diagnosis not present

## 2022-04-30 DIAGNOSIS — Z90710 Acquired absence of both cervix and uterus: Secondary | ICD-10-CM | POA: Diagnosis not present

## 2022-04-30 LAB — CBC WITH DIFFERENTIAL/PLATELET
Abs Immature Granulocytes: 0.02 10*3/uL (ref 0.00–0.07)
Basophils Absolute: 0 10*3/uL (ref 0.0–0.1)
Basophils Relative: 0 %
Eosinophils Absolute: 0.2 10*3/uL (ref 0.0–0.5)
Eosinophils Relative: 5 %
HCT: 49.3 % — ABNORMAL HIGH (ref 36.0–46.0)
Hemoglobin: 16.1 g/dL — ABNORMAL HIGH (ref 12.0–15.0)
Immature Granulocytes: 0 %
Lymphocytes Relative: 32 %
Lymphs Abs: 1.6 10*3/uL (ref 0.7–4.0)
MCH: 30.8 pg (ref 26.0–34.0)
MCHC: 32.7 g/dL (ref 30.0–36.0)
MCV: 94.3 fL (ref 80.0–100.0)
Monocytes Absolute: 0.3 10*3/uL (ref 0.1–1.0)
Monocytes Relative: 5 %
Neutro Abs: 3 10*3/uL (ref 1.7–7.7)
Neutrophils Relative %: 58 %
Platelets: 296 10*3/uL (ref 150–400)
RBC: 5.23 MIL/uL — ABNORMAL HIGH (ref 3.87–5.11)
RDW: 18.3 % — ABNORMAL HIGH (ref 11.5–15.5)
WBC: 5.2 10*3/uL (ref 4.0–10.5)
nRBC: 0 % (ref 0.0–0.2)

## 2022-04-30 LAB — COMPREHENSIVE METABOLIC PANEL
ALT: 21 U/L (ref 0–44)
AST: 22 U/L (ref 15–41)
Albumin: 3.9 g/dL (ref 3.5–5.0)
Alkaline Phosphatase: 53 U/L (ref 38–126)
Anion gap: 8 (ref 5–15)
BUN: 21 mg/dL (ref 8–23)
CO2: 24 mmol/L (ref 22–32)
Calcium: 8.9 mg/dL (ref 8.9–10.3)
Chloride: 104 mmol/L (ref 98–111)
Creatinine, Ser: 1 mg/dL (ref 0.44–1.00)
GFR, Estimated: 58 mL/min — ABNORMAL LOW (ref >60)
Glucose, Bld: 93 mg/dL (ref 70–99)
Potassium: 4.2 mmol/L (ref 3.5–5.1)
Sodium: 136 mmol/L (ref 135–145)
Total Bilirubin: 0.8 mg/dL (ref 0.3–1.2)
Total Protein: 7.5 g/dL (ref 6.5–8.1)

## 2022-04-30 LAB — LACTATE DEHYDROGENASE: LDH: 197 U/L — ABNORMAL HIGH (ref 98–192)

## 2022-04-30 NOTE — Progress Notes (Unsigned)
[name] [name], [place] [address]   CLINIC:  Medical Oncology/Hematology  PCP:  [name], MD [place] [address] [phone]   REASON FOR VISIT:  Follow-up for JAK2 polycythemia vera  PRIOR THERAPY: None  CURRENT THERAPY: Hydrea 500 mg daily + aspirin 81 mg + phlebotomy  INTERVAL HISTORY:   [name] [name] 77 y.o. female returns for routine follow-up of her polycythemia vera.  She was last seen by Tarri Abernethy PA-C on 03/30/2022.  At today's visit, she reports feeling fairly well.  Since her last visit she underwent phlebotomy x 2 (04/07/2022 and 04/14/2022), which she tolerated well.  She is prescribed Hydrea 1000 mg daily, but she reports that she has been taking this approximately 4 days each week and that she will intentionally skip doses due to concern regarding meal changes and hyperpigmentation of her fingers.  She denies any other adverse effects such as mouth sores, skin sores, or GI upset.  She has good energy, and stays active doing cardio and weightlifting throughout the week.  She takes aspirin "sometimes," but cannot specify how often.  Overall, she feels better after starting phlebotomy, and reports decreased headaches and improved tinnitus.  She has also been following closely with her PCP (Dr. Doren Custard) for management of hypertension, which has significantly improved after new medications and phlebotomy.  She denies any aquagenic pruritus, erythromelalgia, or B symptoms.  She has 90% energy and 100% appetite. She endorses that she is maintaining a stable weight.  ASSESSMENT & PLAN:  1.   JAK2 positive polycythemia vera - Seen at request of Dr. Theador Hawthorne for elevated hemoglobin and platelet count (CBC from 11/20/2021 showed Hgb 16.9/hematocrit 51.1, platelets 613) - Review of EMR shows platelet count elevated since August 2015,  Hemoglobin elevated since January 2019 - No history of DVT, PE, MI, or CVA. -  Hematology work-up (11/20/2021):  JAK2 V617F positive.  Low erythropoietin 1.8 - Hydrea started on 01/14/2022.  Current dose 1000 mg daily, but she reports that she is only taking this 3 to 4 days each week. - Aspirin 81 mg recommended with extensive discussion regarding medical indication for antiplatelet agents.  Patient states that she "did her own research and knows that it could give her a heart attack," so she only takes it sometimes - Unable to increase Hydrea past 1000 mg daily due to side effects, therefore started on phlebotomy in February 2024, which she is tolerating well.   - Most recent labs (04/30/2022): Hgb/hematocrit improved with Hgb 16.1/hematocrit 49.3.  Platelets have normalized at 296.  LDH trending downward at 197. - She is feeling better (decreased headaches and tinnitus, improved blood pressure) after starting phlebotomy - Extensive discussion regarding diagnosis of polycythemia vera, including risk of thrombosis (CVA, MI, DVT, PE) as well as risk of myelofibrosis and leukemia in some patients. - Based on her age, she is considered high risk and therefore recommended cytoreductive therapy with hydroxyurea.  Due to side effects from increased dose of hydroxyurea, we will use combination of Hydrea and phlebotomy to achieve target blood counts. Patient was inquiring about treatment with PTG-300 ([place]), which is currently only available in clinical trials.  Discussed with patient that clinical trials are available in [place], and if she would like to pursue this we can place referral. - PLAN: Currently prescribed Hydrea 1000 mg daily, but only taking this 3 to 4 days weekly.  We will decrease Hydrea to 500  mg daily, which patient states she will be compliant with. - Continue phlebotomy every other week x 2 months. - Continue to recommend aspirin 81 mg daily (patient refuses)  - Labs in 2 months = CBC/D, CMP, LDH - Office visit 1 to 2 days after labs    2.  Social/family  history: - Lives at home with her family.  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  She was never smoker. - No family history of leukemias or malignancies.   PLAN SUMMARY: >> Therapeutic phlebotomy every 2 weeks >> Labs in 2 months = CBC/D, CMP, LDH >> Next day OFFICE visit in 2 months (same day as phlebotomy, 1 day after labs)    REVIEW OF SYSTEMS:  Review of Systems  Constitutional:  Negative for appetite change, chills, diaphoresis, fatigue, fever and unexpected weight change.  HENT:   Positive for tinnitus (Improved). Negative for lump/mass and nosebleeds.   Eyes:  Negative for eye problems.  Respiratory:  Positive for cough (Seasonal allergies). Negative for hemoptysis and shortness of breath.   Cardiovascular:  Negative for chest pain, leg swelling and palpitations.  Gastrointestinal:  Negative for abdominal pain, blood in stool, constipation, diarrhea, nausea and vomiting.  Genitourinary:  Negative for hematuria.   Musculoskeletal:  Positive for back pain.  Skin: Negative.   Neurological:  Positive for headaches (Improved). Negative for dizziness and light-headedness.  Hematological:  Does not bruise/bleed easily.  Psychiatric/Behavioral:  Positive for sleep disturbance.      PHYSICAL EXAM:  ECOG PERFORMANCE STATUS: 1 - Symptomatic but completely ambulatory There were no vitals filed for this visit. There were no vitals filed for this visit. Physical Exam Constitutional:      Appearance: Normal appearance. She is obese.  HENT:     Head: Normocephalic and atraumatic.     Mouth/Throat:     Mouth: Mucous membranes are moist.  Eyes:     Extraocular Movements: Extraocular movements intact.     Pupils: Pupils are equal, round, and reactive to light.  Cardiovascular:     Rate and Rhythm: Normal rate and regular rhythm.     Pulses: Normal pulses.     Heart sounds: Normal heart sounds.  Pulmonary:      Effort: Pulmonary effort is normal.     Breath sounds: Normal breath sounds.  Abdominal:     General: Bowel sounds are normal.     Palpations: Abdomen is soft.     Tenderness: There is no abdominal tenderness.  Musculoskeletal:        General: No swelling.     Right lower leg: No edema.     Left lower leg: No edema.  Lymphadenopathy:     Cervical: No cervical adenopathy.  Skin:    General: Skin is warm and dry.  Neurological:     General: No focal deficit present.     Mental Status: She is alert and oriented to person, place, and time.  Psychiatric:        Mood and Affect: Mood normal.        Behavior: Behavior normal.    PAST MEDICAL/SURGICAL HISTORY:  Past Medical History:  Diagnosis Date   Anxiety    Dysphagia 09/03/2020   Essential hypertension    HLD (hyperlipidemia)    Hx of statin intolerance   Hypothyroidism    NSTEMI (non-ST elevated myocardial infarction) ([place]) 2015   a.  Related to occlusion of the distal Cx.  The mid to distal OM1 also contains a significant stenosis that is best treated with medical therapy. Patent RCA/LAD with tortuosity. LM patent. EF 55%.    Polycythemia vera ([place])    Stage 3b chronic kidney disease    Past Surgical History:  Procedure Laterality Date   ABDOMINAL HYSTERECTOMY     CARDIAC CATHETERIZATION  ~ 2000   CORONARY ANGIOPLASTY  09/20/2013   LEFT HEART CATHETERIZATION WITH CORONARY ANGIOGRAM N/A 09/20/2013   Procedure: LEFT HEART CATHETERIZATION WITH CORONARY ANGIOGRAM;  Surgeon: Sinclair Grooms, MD;  Location: [facility] CATH LAB;  Service: Cardiovascular;  Laterality: N/A;   LEFT HEART CATHETERIZATION WITH CORONARY ANGIOGRAM N/A 09/27/2013   Procedure: LEFT HEART CATHETERIZATION WITH CORONARY ANGIOGRAM;  Surgeon: Blane Ohara, MD;  Location: [facility] CATH LAB;  Service: Cardiovascular;  Laterality: N/A;   THYROIDECTOMY  10/08/2011   Procedure: THYROIDECTOMY;  Surgeon: [name], MD;  Location: [facility] ORS;  Service: General;  Laterality: N/A;   Total   VAGINAL HYSTERECTOMY  1993    SOCIAL HISTORY:  Social History   Socioeconomic History   Marital status: Widowed    Spouse name: Not on file   Number of children: Not on file   Years of education: Not on file   Highest education level: Not on file  Occupational History   Not on file  Tobacco Use   Smoking status: Never   Smokeless tobacco: Never  Vaping Use   Vaping Use: Never used  Substance and Sexual Activity   Alcohol use: No    Alcohol/week: 0.0 standard drinks of alcohol   Drug use: No   Sexual activity: Never  Other Topics Concern   Not on file  Social History Narrative   ** Merged History Encounter **  Lives at home with family.  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      Social Determinants of Health   Financial Resource Strain: Low Risk  (07/21/2020)   Overall Financial Resource Strain (CARDIA)    Difficulty of Paying Living Expenses: Not very hard  Food Insecurity: No Food Insecurity (07/21/2020)   Hunger Vital Sign    Worried About Running Out of Food in the Last Year: Never true    Ran Out of Food in the Last Year: Never true  Transportation Needs: No Transportation Needs (09/08/2020)   PRAPARE - Hydrologist (Medical): No    Lack of Transportation (Non-Medical): No  Physical Activity: Sufficiently Active (03/11/2020)   Exercise Vital Sign    Days of Exercise per Week: 5 days    Minutes of Exercise per Session: 30 min  Stress: No Stress Concern Present (03/11/2020)   [place]    Feeling of Stress : Not at all  Social Connections: Not on file  Intimate Partner Violence: Not on file    FAMILY HISTORY:  Family History  Problem Relation Age of Onset   Colon cancer Father    Thyroid disease Neg Hx     CURRENT MEDICATIONS:  Outpatient Encounter Medications as of 05/03/2022  Medication Sig   aspirin EC 81 MG tablet Take 1 tablet (81 mg total) by mouth daily.  Swallow whole.   carvedilol (COREG) 25 MG tablet Take 1.5 tablets (37.5 mg total) by mouth 2 (two) times daily.   chlorthalidone (HYGROTON) 25 MG tablet Take 1 tablet (25 mg total) by mouth daily.   hydroxyurea (HYDREA) 500 MG capsule Take 2 capsules (1,000 mg total)  by mouth daily. May take with food to minimize GI side effects.   levothyroxine (SYNTHROID) 125 MCG tablet Take 1 tablet (125 mcg total) by mouth daily before breakfast. 1 HOUR PRIOR TO OTHER MEDICATIONS   minoxidil (LONITEN) 10 MG tablet Take 1 tablet (10 mg total) by mouth daily.   spironolactone (ALDACTONE) 25 MG tablet Take 1 tablet (25 mg total) by mouth daily.   No facility-administered encounter medications on file as of 05/03/2022.    ALLERGIES:  Allergies  Allergen Reactions   Fluviral [Influenza Virus Vaccine] Shortness Of Breath, Swelling and Other (See Comments)    Including back pain that radiates in upper extremeties   Haemophilus Influenzae Other (See Comments), Shortness Of Breath and Swelling    Including back pain that radiates in upper extremeties   Amlodipine     Lip swelling per patient.    Lisinopril     Dr. Legrand Rams stopped due to side effects per patient.  Could remember what side effects were.     Ranexa [Ranolazine]     Chest pain    LABORATORY DATA:  I have reviewed the labs as listed.  CBC    Component Value Date/Time   WBC 5.2 04/30/2022 1311   RBC 5.23 (H) 04/30/2022 1311   HGB 16.1 (H) 04/30/2022 1311   HCT 49.3 (H) 04/30/2022 1311   PLT 296 04/30/2022 1311   MCV 94.3 04/30/2022 1311   MCH 30.8 04/30/2022 1311   MCHC 32.7 04/30/2022 1311   RDW 18.3 (H) 04/30/2022 1311   LYMPHSABS 1.6 04/30/2022 1311   MONOABS 0.3 04/30/2022 1311   EOSABS 0.2 04/30/2022 1311   BASOSABS 0.0 04/30/2022 1311      Latest Ref Rng & Units 04/30/2022    1:11 PM 03/30/2022    9:09 AM 02/19/2022   10:16 AM  CMP  Glucose 70 - 99 mg/dL 93  101  86   BUN 8 - 23 mg/dL 21  18  25    Creatinine 0.44 - 1.00  mg/dL 1.00  1.07  1.25   Sodium 135 - 145 mmol/L 136  140  139   Potassium 3.5 - 5.1 mmol/L 4.2  4.3  4.4   Chloride 98 - 111 mmol/L 104  104  106   CO2 22 - 32 mmol/L 24  27  24    Calcium 8.9 - 10.3 mg/dL 8.9  9.0  9.2   Total Protein 6.5 - 8.1 g/dL 7.5   7.5   Total Bilirubin 0.3 - 1.2 mg/dL 0.8   0.6   Alkaline Phos 38 - 126 U/L 53   46   AST 15 - 41 U/L 22   26   ALT 0 - 44 U/L 21   23     DIAGNOSTIC IMAGING:  I have independently reviewed the relevant imaging and discussed with the patient.   WRAP UP:  All questions were answered. The patient knows to call the clinic with any problems, questions or concerns.  Medical decision making: Moderate  Time spent on visit: I spent 20 minutes counseling the patient face to face. The total time spent in the appointment was 30 minutes and more than 50% was on counseling.  Harriett Rush, PA-C  05/03/2022 8:14 PM

## 2022-05-03 ENCOUNTER — Inpatient Hospital Stay (HOSPITAL_BASED_OUTPATIENT_CLINIC_OR_DEPARTMENT_OTHER): Payer: Medicare HMO | Admitting: Physician Assistant

## 2022-05-03 VITALS — BP 138/85 | HR 73 | Temp 97.5°F | Resp 18 | Ht 63.0 in | Wt 176.0 lb

## 2022-05-03 DIAGNOSIS — D45 Polycythemia vera: Secondary | ICD-10-CM | POA: Diagnosis not present

## 2022-05-03 DIAGNOSIS — N1832 Chronic kidney disease, stage 3b: Secondary | ICD-10-CM | POA: Diagnosis not present

## 2022-05-03 DIAGNOSIS — Z800 Family history of malignant neoplasm of digestive organs: Secondary | ICD-10-CM | POA: Diagnosis not present

## 2022-05-03 DIAGNOSIS — Z90710 Acquired absence of both cervix and uterus: Secondary | ICD-10-CM | POA: Diagnosis not present

## 2022-05-03 DIAGNOSIS — I129 Hypertensive chronic kidney disease with stage 1 through stage 4 chronic kidney disease, or unspecified chronic kidney disease: Secondary | ICD-10-CM | POA: Diagnosis not present

## 2022-05-03 MED ORDER — HYDROXYUREA 500 MG PO CAPS: 500.0000 mg | ORAL_CAPSULE | Freq: Every day | ORAL | 1 refills | Status: DC

## 2022-05-03 NOTE — Patient Instructions (Signed)
[facility] **   You were seen today by Tarri Abernethy PA-C for your polycythemia vera.    POLYCYTHEMIA VERA Your blood counts look much better than at your previous appointments! We will decrease your Hydrea so that you are taking 500 mg once a day. We will continue phlebotomy once every 2 weeks for the next 2 months. I will see you for follow-up visit in 2 months with labs checked the day before.  FOLLOW-UP APPOINTMENT: Office visit in 2 months  ** Thank you for trusting me with your healthcare!  I strive to provide all of my patients with quality care at each visit.  If you receive a survey for this visit, I would be so grateful to you for taking the time to provide feedback.  Thank you in advance!  ~ [place]                   Dr. Derek Jack   &   Tarri Abernethy, PA-C   - - - - - - - - - - - - - - - - - -    Thank you for choosing [place] at [facility] to provide your oncology and hematology care.  To afford each patient quality time with our provider, please arrive at least 15 minutes before your scheduled appointment time.   If you have a lab appointment with the [facility] please come in thru the Main Entrance and check in at the main information desk.  You need to re-schedule your appointment should you arrive 10 or more minutes late.  We strive to give you quality time with our providers, and arriving late affects you and other patients whose appointments are after yours.  Also, if you no show three or more times for appointments you may be dismissed from the clinic at the providers discretion.     Again, thank you for choosing [facility].  Our hope is that these requests will decrease the amount of time that you wait before being seen by our physicians.       _____________________________________________________________  Should you have  questions after your visit to [facility], please contact our office at [phone] and follow the prompts.  Our office hours are 8:00 a.m. and 4:30 p.m. Monday - Friday.  Please note that voicemails left after 4:00 p.m. may not be returned until the following business day.  We are closed weekends and major holidays.  You do have access to a nurse 24-7, just call the main number to the clinic [phone] and do not press any options, hold on the line and a nurse will answer the phone.    For prescription refill requests, have your pharmacy contact our office and allow 72 hours.

## 2022-05-04 ENCOUNTER — Other Ambulatory Visit: Payer: Self-pay

## 2022-05-04 DIAGNOSIS — D751 Secondary polycythemia: Secondary | ICD-10-CM

## 2022-05-04 DIAGNOSIS — D45 Polycythemia vera: Secondary | ICD-10-CM

## 2022-05-04 DIAGNOSIS — Z1589 Genetic susceptibility to other disease: Secondary | ICD-10-CM

## 2022-05-05 ENCOUNTER — Encounter: Payer: Self-pay | Admitting: Cardiovascular Disease

## 2022-05-14 ENCOUNTER — Encounter: Payer: Medicaid Other | Admitting: Family Medicine

## 2022-05-17 ENCOUNTER — Inpatient Hospital Stay: Payer: Medicare HMO

## 2022-05-19 ENCOUNTER — Inpatient Hospital Stay: Payer: Medicare HMO | Attending: Hematology

## 2022-05-19 DIAGNOSIS — D45 Polycythemia vera: Secondary | ICD-10-CM | POA: Diagnosis not present

## 2022-05-19 NOTE — Patient Instructions (Signed)
[facility] [place]  Discharge Instructions: Thank you for choosing [facility] to provide your oncology and hematology care.  If you have a lab appointment with the Cancer Center - please note that after April 8th, 2024, all labs will be drawn in the cancer center.  You do not have to check in or register with the main entrance as you have in the past but will complete your check-in in the cancer center.  Wear comfortable clothing and clothing appropriate for easy access to any Portacath or PICC line.   We strive to give you quality time with your provider. You may need to reschedule your appointment if you arrive late (15 or more minutes).  Arriving late affects you and other patients whose appointments are after yours.  Also, if you miss three or more appointments without notifying the office, you may be dismissed from the clinic at the provider's discretion.      For prescription refill requests, have your pharmacy contact our office and allow 72 hours for refills to be completed.  To help prevent nausea and vomiting after your treatment, we encourage you to take your nausea medication as directed.  BELOW ARE SYMPTOMS THAT SHOULD BE REPORTED IMMEDIATELY: *FEVER GREATER THAN 100.4 F (38 C) OR HIGHER *CHILLS OR SWEATING *NAUSEA AND VOMITING THAT IS NOT CONTROLLED WITH YOUR NAUSEA MEDICATION *UNUSUAL SHORTNESS OF BREATH *UNUSUAL BRUISING OR BLEEDING *URINARY PROBLEMS (pain or burning when urinating, or frequent urination) *BOWEL PROBLEMS (unusual diarrhea, constipation, pain near the anus) TENDERNESS IN MOUTH AND THROAT WITH OR WITHOUT PRESENCE OF ULCERS (sore throat, sores in mouth, or a toothache) UNUSUAL RASH, SWELLING OR PAIN  UNUSUAL VAGINAL DISCHARGE OR ITCHING   Items with * indicate a potential emergency and should be followed up as soon as possible or go to the Emergency Department if any problems should occur.  Please show the CHEMOTHERAPY ALERT CARD or  IMMUNOTHERAPY ALERT CARD at check-in to the Emergency Department and triage nurse.  Should you have questions after your visit or need to cancel or reschedule your appointment, please contact [facility] [place] [phone]  and follow the prompts.  Office hours are 8:00 a.m. to 4:30 p.m. Monday - Friday. Please note that voicemails left after 4:00 p.m. may not be returned until the following business day.  We are closed weekends and major holidays. You have access to a nurse at all times for urgent questions. Please call the main number to the clinic [phone] and follow the prompts.  For any non-urgent questions, you may also contact your provider using MyChart. We now offer e-Visits for anyone 18 and older to request care online for non-urgent symptoms. For details visit [email or web address].   Also download the MyChart app! Go to the app store, search "MyChart", open the app, select [place], and log in with your MyChart username and password.   

## 2022-05-19 NOTE — Progress Notes (Signed)
[name] [name] presents today for theraputic phlebotomy per MD orders. VSS prior to procedure. Pt reports eating before arrival. Procedure started at 1530 using patients R AC. 500 grams of blood removed. Procedure ended at 1544. Gauze and coban applied to [facility], site clean and dry. VSS upon completion of procedure. Pt denies dizziness, lightheadedness, or feeling faint. Observed for 10 minutes post procedure. Patient refused to wait the full recommended 30 minutes.  Discharged in satisfactory condition with follow up instructions.

## 2022-05-26 ENCOUNTER — Encounter: Payer: Medicaid Other | Admitting: Family Medicine

## 2022-05-31 ENCOUNTER — Inpatient Hospital Stay: Payer: Medicare HMO

## 2022-06-02 ENCOUNTER — Inpatient Hospital Stay: Payer: Medicare HMO

## 2022-06-02 DIAGNOSIS — D45 Polycythemia vera: Secondary | ICD-10-CM | POA: Diagnosis not present

## 2022-06-02 NOTE — Progress Notes (Signed)
Patient presents today for phlebotomy per MD orders. Phlebotomy procedure started at 1505 and ended at 1530. 250 cc removed.  PA notified that nurse was unable to pull the full 500 cc off. Patient tolerated procedure well. Procedure tolerated well and without incident. Discharged ambulatory in stable condition.

## 2022-06-02 NOTE — Patient Instructions (Signed)
[facility]  Discharge Instructions: Thank you for choosing [facility] to provide your oncology and hematology care.  If you have a lab appointment with the Cancer Center - please note that after April 8th, 2024, all labs will be drawn in the cancer center.  You do not have to check in or register with the main entrance as you have in the past but will complete your check-in in the cancer center.  Wear comfortable clothing and clothing appropriate for easy access to any Portacath or PICC line.   We strive to give you quality time with your provider. You may need to reschedule your appointment if you arrive late (15 or more minutes).  Arriving late affects you and other patients whose appointments are after yours.  Also, if you miss three or more appointments without notifying the office, you may be dismissed from the clinic at the provider's discretion.      For prescription refill requests, have your pharmacy contact our office and allow 72 hours for refills to be completed.    Today you received the following chemotherapy and/or immunotherapy agents therapeutic phlebotomy      To help prevent nausea and vomiting after your treatment, we encourage you to take your nausea medication as directed.  BELOW ARE SYMPTOMS THAT SHOULD BE REPORTED IMMEDIATELY: *FEVER GREATER THAN 100.4 F (38 C) OR HIGHER *CHILLS OR SWEATING *NAUSEA AND VOMITING THAT IS NOT CONTROLLED WITH YOUR NAUSEA MEDICATION *UNUSUAL SHORTNESS OF BREATH *UNUSUAL BRUISING OR BLEEDING *URINARY PROBLEMS (pain or burning when urinating, or frequent urination) *BOWEL PROBLEMS (unusual diarrhea, constipation, pain near the anus) TENDERNESS IN MOUTH AND THROAT WITH OR WITHOUT PRESENCE OF ULCERS (sore throat, sores in mouth, or a toothache) UNUSUAL RASH, SWELLING OR PAIN  UNUSUAL VAGINAL DISCHARGE OR ITCHING   Items with * indicate a potential emergency and should be followed up as soon as possible  or go to the Emergency Department if any problems should occur.  Please show the CHEMOTHERAPY ALERT CARD or IMMUNOTHERAPY ALERT CARD at check-in to the Emergency Department and triage nurse.  Should you have questions after your visit or need to cancel or reschedule your appointment, please contact [facility] [phone]  and follow the prompts.  Office hours are 8:00 a.m. to 4:30 p.m. Monday - Friday. Please note that voicemails left after 4:00 p.m. may not be returned until the following business day.  We are closed weekends and major holidays. You have access to a nurse at all times for urgent questions. Please call the main number to the clinic [phone] and follow the prompts.  For any non-urgent questions, you may also contact your provider using MyChart. We now offer e-Visits for anyone 16 and older to request care online for non-urgent symptoms. For details visit [email or web address].   Also download the MyChart app! Go to the app store, search "MyChart", open the app, select [place], and log in with your MyChart username and password.

## 2022-06-12 ENCOUNTER — Encounter (HOSPITAL_COMMUNITY): Payer: Self-pay

## 2022-06-12 ENCOUNTER — Emergency Department (HOSPITAL_COMMUNITY)
Admission: EM | Admit: 2022-06-12 | Discharge: 2022-06-12 | Disposition: A | Discharge status: Home / Self Care | Payer: Medicare HMO | Attending: Emergency Medicine | Admitting: Emergency Medicine

## 2022-06-12 DIAGNOSIS — I10 Essential (primary) hypertension: Secondary | ICD-10-CM | POA: Diagnosis not present

## 2022-06-12 DIAGNOSIS — Z7982 Long term (current) use of aspirin: Secondary | ICD-10-CM | POA: Insufficient documentation

## 2022-06-12 DIAGNOSIS — E669 Obesity, unspecified: Secondary | ICD-10-CM | POA: Diagnosis not present

## 2022-06-12 DIAGNOSIS — M25422 Effusion, left elbow: Secondary | ICD-10-CM | POA: Diagnosis not present

## 2022-06-12 DIAGNOSIS — M79622 Pain in left upper arm: Secondary | ICD-10-CM | POA: Diagnosis not present

## 2022-06-12 DIAGNOSIS — Z6832 Body mass index (BMI) 32.0-32.9, adult: Secondary | ICD-10-CM | POA: Diagnosis not present

## 2022-06-12 DIAGNOSIS — R030 Elevated blood-pressure reading, without diagnosis of hypertension: Secondary | ICD-10-CM | POA: Diagnosis not present

## 2022-06-12 MED ORDER — DIPHENHYDRAMINE HCL 25 MG PO CAPS
25.0000 mg | ORAL_CAPSULE | Freq: Once | ORAL | Status: AC
  Administered 2022-06-12: 25 mg via ORAL
  Filled 2022-06-12: qty 1

## 2022-06-12 MED ORDER — CEPHALEXIN 500 MG PO CAPS
500.0000 mg | ORAL_CAPSULE | Freq: Once | ORAL | Status: AC
  Administered 2022-06-12: 500 mg via ORAL
  Filled 2022-06-12: qty 1

## 2022-06-12 MED ORDER — CEPHALEXIN 500 MG PO CAPS: 500.0000 mg | ORAL_CAPSULE | Freq: Four times a day (QID) | ORAL | 0 refills | Status: DC

## 2022-06-12 NOTE — ED Provider Notes (Signed)
[place] EMERGENCY DEPARTMENT AT [facility] Provider Note   CSN: [id number] Arrival date & time: 06/12/22  1140     History {Add pertinent medical, surgical, social history, OB history to HPI:1} Chief Complaint  Patient presents with   Arm Swelling    [name] [name] is a 77 y.o. female.  Pt indicates noted a localized area of itching, mild swelling to left upper arm posteriorly, feeling as if possibly bitten by an insect. Area seemed a bit more pronounced today, and called doctors office and was told to go to ER. No generalized arm swelling or pain, no hx dvt or pe. No neck/radicular pain. No other area of body itchy or rash. No fever or chills.   The history is provided by the patient and medical records.       Home Medications Prior to Admission medications   Medication Sig Start Date End Date Taking? Authorizing Provider  aspirin EC 81 MG tablet Take 1 tablet (81 mg total) by mouth daily. Swallow whole. 01/14/22   Carnella Guadalajara, PA-C  carvedilol (COREG) 25 MG tablet Take 1.5 tablets (37.5 mg total) by mouth 2 (two) times daily. 03/15/22 09/11/22  Sharlene Dory, NP  chlorthalidone (HYGROTON) 25 MG tablet Take 1 tablet (25 mg total) by mouth daily. 03/26/22   Billie Lade, MD  hydroxyurea (HYDREA) 500 MG capsule Take 1 capsule (500 mg total) by mouth daily. May take with food to minimize GI side effects. 05/03/22   Carnella Guadalajara, PA-C  levothyroxine (SYNTHROID) 125 MCG tablet Take 1 tablet (125 mcg total) by mouth daily before breakfast. 1 HOUR PRIOR TO OTHER MEDICATIONS 02/07/21   Shon Hale, MD  minoxidil (LONITEN) 10 MG tablet Take 1 tablet (10 mg total) by mouth daily. 03/26/22 06/24/22  Billie Lade, MD  spironolactone (ALDACTONE) 25 MG tablet Take 1 tablet (25 mg total) by mouth daily. 02/07/21 08/11/22  Shon Hale, MD      Allergies    Fluviral [influenza virus vaccine], Haemophilus influenzae, Amlodipine, Lisinopril, and Ranexa  [ranolazine]    Review of Systems   Review of Systems  Constitutional:  Negative for chills and fever.  Respiratory:  Negative for shortness of breath.   Cardiovascular:  Negative for chest pain.  Musculoskeletal:  Negative for neck pain.  Skin:  Negative for rash.    Physical Exam Updated Vital Signs BP (!) 163/93 (BP Location: Right Arm)   Pulse 69   Temp 97.8 F (36.6 C) (Oral)   Resp 18   Ht 1.6 m (5\' 3" )   Wt 81.2 kg   SpO2 97%   BMI 31.71 kg/m  Physical Exam Vitals and nursing note reviewed.  Constitutional:      Appearance: Normal appearance. She is well-developed.  HENT:     Head: Atraumatic.     Nose: Nose normal.     Mouth/Throat:     Mouth: Mucous membranes are moist.  Eyes:     General: No scleral icterus.    Conjunctiva/sclera: Conjunctivae normal.  Neck:     Trachea: No tracheal deviation.  Cardiovascular:     Rate and Rhythm: Normal rate and regular rhythm.     Pulses: Normal pulses.     Heart sounds: Normal heart sounds. No murmur heard.    No friction rub. No gallop.  Pulmonary:     Effort: Pulmonary effort is normal. No respiratory distress.     Breath sounds: Normal breath sounds.  Abdominal:  Tenderness: There is no guarding.  Musculoskeletal:     Cervical back: Normal range of motion and neck supple. No rigidity. No muscular tenderness.     Comments: Pt with area of localized swelling, very mild tenderness, mild induration, and mildly increased warmth/erythema to left posterior upper arm. No generalized arm swelling. No pain w passive rom at shoulder or elbow. Radial pulse 2+. Skin is intact.   Skin:    General: Skin is warm and dry.     Findings: No rash.  Neurological:     Mental Status: She is alert.     Comments: Alert, speech normal. LUE nvi.   Psychiatric:        Mood and Affect: Mood normal.     ED Results / Procedures / Treatments   Labs (all labs ordered are listed, but only abnormal results are displayed) Labs Reviewed  - No data to display  EKG None  Radiology No results found.  Procedures Procedures  {Document cardiac monitor, telemetry assessment procedure when appropriate:1}  Medications Ordered in ED Medications - No data to display  ED Course/ Medical Decision Making/ A&P   {   Click here for ABCD2, HEART and other calculatorsREFRESH Note before signing :1}                          Medical Decision Making  Exam c/w area of localized mild swelling, induration, increased warmth.  Possible insect bite/sting, vs mild early secondary cellulitis. Mild itching to area.   Reviewed nursing notes and prior charts for additional history.   No meds pta.   Benadryl po. Keflex po.   Pt appears stable for d/c.   Return precautions provided.     {Document critical care time when appropriate:1} {Document review of labs and clinical decision tools ie heart score, Chads2Vasc2 etc:1}  {Document your independent review of radiology images, and any outside records:1} {Document your discussion with family members, caretakers, and with consultants:1} {Document social determinants of health affecting pt's care:1} {Document your decision making why or why not admission, treatments were needed:1} Final Clinical Impression(s) / ED Diagnoses Final diagnoses:  None    Rx / DC Orders ED Discharge Orders     None

## 2022-06-12 NOTE — ED Triage Notes (Addendum)
Pt states she thought she was bitten by an insect and then started having swelling in her L arm. Pt states it started as a localized spot and now swelling to posterior upper arm with pruritus. Pt had a flight from [place] yesterday. 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

## 2022-06-12 NOTE — ED Notes (Signed)
Patient given ginger ale Peanut butter and crackers

## 2022-06-12 NOTE — Discharge Instructions (Signed)
It was our pleasure to provide your ER care today - we hope that you feel better.  Keep skin/area very clean. For itching, may take benadryl 25 mg every 6 hours (available over the counter). Take keflex (antibiotic) as prescribed for mild/early skin infection.   Follow up closely with primary care doctor in the coming week if symptoms fail to improve/resolve. Also follow up closely with your doctor regarding your blood pressure that is high today.  Return to ER if worse, new symptoms, fevers, increased/progressive or severe arm swelling, spreading redness, severe pain, or other concern.

## 2022-06-14 ENCOUNTER — Inpatient Hospital Stay: Payer: Medicare HMO

## 2022-06-15 ENCOUNTER — Telehealth: Payer: Self-pay

## 2022-06-15 NOTE — Transitions of Care (Post Inpatient/ED Visit) (Signed)
   06/15/2022  Name: [name] [name] MRN: [id number] DOB: [birth year]  Today's TOC FU Call Status: Today's TOC FU Call Status:: Unsuccessul Call (1st Attempt) Unsuccessful Call (1st Attempt) Date: 06/15/22  Red on EMMI-ED Discharge Alert Date & Reason:06/14/22 "Scheduled follow-up appt? No"  Attempted to reach the patient regarding the most recent Inpatient/ED visit.  Follow Up Plan: Additional outreach attempts will be made to reach the patient to complete the Transitions of Care (Post Inpatient/ED visit) call.     Antionette Fairy, RN,BSN,CCM [facility] Care Management Care Management Community Coordinator Direct Phone: [phone] Toll Free: [phone] Fax: [phone]

## 2022-06-16 ENCOUNTER — Inpatient Hospital Stay: Payer: Medicare HMO | Attending: Hematology

## 2022-06-16 ENCOUNTER — Emergency Department (HOSPITAL_COMMUNITY)
Admission: EM | Admit: 2022-06-16 | Discharge: 2022-06-16 | Disposition: A | Discharge status: Home / Self Care | Payer: Medicare HMO | Attending: Emergency Medicine | Admitting: Emergency Medicine

## 2022-06-16 ENCOUNTER — Encounter (HOSPITAL_COMMUNITY): Payer: Self-pay

## 2022-06-16 ENCOUNTER — Emergency Department (HOSPITAL_COMMUNITY): Payer: Medicare HMO

## 2022-06-16 ENCOUNTER — Other Ambulatory Visit: Payer: Self-pay

## 2022-06-16 DIAGNOSIS — Z800 Family history of malignant neoplasm of digestive organs: Secondary | ICD-10-CM | POA: Insufficient documentation

## 2022-06-16 DIAGNOSIS — R7402 Elevation of levels of lactic acid dehydrogenase (LDH): Secondary | ICD-10-CM | POA: Insufficient documentation

## 2022-06-16 DIAGNOSIS — I129 Hypertensive chronic kidney disease with stage 1 through stage 4 chronic kidney disease, or unspecified chronic kidney disease: Secondary | ICD-10-CM | POA: Insufficient documentation

## 2022-06-16 DIAGNOSIS — N1832 Chronic kidney disease, stage 3b: Secondary | ICD-10-CM | POA: Diagnosis not present

## 2022-06-16 DIAGNOSIS — Z7982 Long term (current) use of aspirin: Secondary | ICD-10-CM | POA: Insufficient documentation

## 2022-06-16 DIAGNOSIS — E1122 Type 2 diabetes mellitus with diabetic chronic kidney disease: Secondary | ICD-10-CM | POA: Insufficient documentation

## 2022-06-16 DIAGNOSIS — R0781 Pleurodynia: Secondary | ICD-10-CM | POA: Insufficient documentation

## 2022-06-16 DIAGNOSIS — Z90710 Acquired absence of both cervix and uterus: Secondary | ICD-10-CM | POA: Insufficient documentation

## 2022-06-16 DIAGNOSIS — D45 Polycythemia vera: Secondary | ICD-10-CM | POA: Insufficient documentation

## 2022-06-16 DIAGNOSIS — R5381 Other malaise: Secondary | ICD-10-CM | POA: Diagnosis not present

## 2022-06-16 DIAGNOSIS — R1084 Generalized abdominal pain: Secondary | ICD-10-CM | POA: Diagnosis not present

## 2022-06-16 DIAGNOSIS — N183 Chronic kidney disease, stage 3 unspecified: Secondary | ICD-10-CM | POA: Diagnosis not present

## 2022-06-16 DIAGNOSIS — Z79899 Other long term (current) drug therapy: Secondary | ICD-10-CM | POA: Diagnosis not present

## 2022-06-16 DIAGNOSIS — I10 Essential (primary) hypertension: Secondary | ICD-10-CM | POA: Diagnosis not present

## 2022-06-16 MED ORDER — IBUPROFEN 600 MG PO TABS: 600.0000 mg | ORAL_TABLET | Freq: Four times a day (QID) | ORAL | 0 refills | Status: DC | PRN

## 2022-06-16 NOTE — ED Provider Notes (Signed)
[place] EMERGENCY DEPARTMENT AT [facility] Provider Note   CSN: [id number] Arrival date & time: 06/16/22  1204     History  Chief Complaint  Patient presents with   rib cage pain    [name] [name] is a 77 y.o. female.  HPI   77 year old female presents emergency department with complaints of bilateral lateral rib pain.  Patient states that she was attempting to swallow earlier today when she became choked on her own saliva.  She states that it felt like "he went down the wrong pipe."  States that she subsequently had bilateral lateral rib pain.  She called EMS due to concern to give her 15 mg of Toradol and route which significantly improved symptoms.  Reports some mild pain still present.  Denies shortness of breath, fever, abdominal pain, nausea, vomiting.  Denies cough other than episode earlier today.  Past medical history significant for NSTEMI, hyperlipidemia, hypertension, polycythemia vera, CKD stage III, dysphagia  Home Medications Prior to Admission medications   Medication Sig Start Date End Date Taking? Authorizing Provider  ibuprofen (ADVIL) 600 MG tablet Take 1 tablet (600 mg total) by mouth every 6 (six) hours as needed. 06/16/22  Yes Sherian Maroon A, PA  aspirin EC 81 MG tablet Take 1 tablet (81 mg total) by mouth daily. Swallow whole. 01/14/22   Carnella Guadalajara, PA-C  carvedilol (COREG) 25 MG tablet Take 1.5 tablets (37.5 mg total) by mouth 2 (two) times daily. 03/15/22 09/11/22  Sharlene Dory, NP  cephALEXin (KEFLEX) 500 MG capsule Take 1 capsule (500 mg total) by mouth 4 (four) times daily. 06/12/22   Cathren Laine, MD  chlorthalidone (HYGROTON) 25 MG tablet Take 1 tablet (25 mg total) by mouth daily. 03/26/22   Billie Lade, MD  hydroxyurea (HYDREA) 500 MG capsule Take 1 capsule (500 mg total) by mouth daily. May take with food to minimize GI side effects. 05/03/22   Carnella Guadalajara, PA-C  levothyroxine (SYNTHROID) 125 MCG tablet Take 1  tablet (125 mcg total) by mouth daily before breakfast. 1 HOUR PRIOR TO OTHER MEDICATIONS 02/07/21   Shon Hale, MD  minoxidil (LONITEN) 10 MG tablet Take 1 tablet (10 mg total) by mouth daily. 03/26/22 06/24/22  Billie Lade, MD  spironolactone (ALDACTONE) 25 MG tablet Take 1 tablet (25 mg total) by mouth daily. 02/07/21 08/11/22  Shon Hale, MD      Allergies    Fluviral [influenza virus vaccine], Haemophilus influenzae, Hydralazine, Amlodipine, Lisinopril, and Ranexa [ranolazine]    Review of Systems   Review of Systems  All other systems reviewed and are negative.   Physical Exam Updated Vital Signs BP (!) 181/105 (BP Location: Left Arm) Comment: no BP meds today  Pulse 68   Temp 98 F (36.7 C) (Oral)   Resp 16   Ht 5\' 3"  (1.6 m)   Wt 78 kg   SpO2 99%   BMI 30.47 kg/m  Physical Exam Vitals and nursing note reviewed.  Constitutional:      General: She is not in acute distress.    Appearance: She is well-developed.  HENT:     Head: Normocephalic and atraumatic.  Eyes:     Conjunctiva/sclera: Conjunctivae normal.  Cardiovascular:     Rate and Rhythm: Normal rate and regular rhythm.  Pulmonary:     Effort: Pulmonary effort is normal. No respiratory distress.     Breath sounds: Normal breath sounds. No wheezing or rales.     Comments: Patient  with bilateral lower rib tenderness to palpation.  No overlying skin abnormalities.  No obvious palpable step-off, crepitus or deformity. Chest:     Chest wall: Tenderness present.  Abdominal:     Palpations: Abdomen is soft.     Tenderness: There is no abdominal tenderness.  Musculoskeletal:        General: No swelling.     Cervical back: Neck supple.  Skin:    General: Skin is warm and dry.     Capillary Refill: Capillary refill takes less than 2 seconds.  Neurological:     Mental Status: She is alert.  Psychiatric:        Mood and Affect: Mood normal.     ED Results / Procedures / Treatments   Labs (all  labs ordered are listed, but only abnormal results are displayed) Labs Reviewed - No data to display  EKG None  Radiology DG Chest 2 View  Result Date: 06/16/2022 CLINICAL DATA:  Choking episode, rib pain. EXAM: CHEST - 2 VIEW COMPARISON:  02/07/2021 and CT chest 02/07/2021. FINDINGS: Trachea is midline. Heart is enlarged, as before. Thoracic aorta is calcified and uncoiled. Minimal linear scarring in the lingula. Mild hyperinflation. Lungs are otherwise clear. No pleural fluid. IMPRESSION: 1. No acute findings. 2.  Aortic atherosclerosis (ICD10-I70.0). Electronically Signed   By: Leanna Battles M.D.   On: 06/16/2022 12:45    Procedures Procedures    Medications Ordered in ED Medications - No data to display  ED Course/ Medical Decision Making/ A&P                             Medical Decision Making Amount and/or Complexity of Data Reviewed Radiology: ordered.   This patient presents to the ED for concern of rib pain, this involves an extensive number of treatment options, and is a complaint that carries with it a high risk of complications and morbidity.  The differential diagnosis includes fracture, dislocation, aspiration, strain/sprain, pneumothorax   Co morbidities that complicate the patient evaluation  See HPI   Additional history obtained:  Additional history obtained from EMR External records from outside source obtained and reviewed including hospital records   Lab Tests:  N/a   Imaging Studies ordered:  I ordered imaging studies including chest x-ray I independently visualized and interpreted imaging which showed no acute cardiopulmonary abnormalities I agree with the radiologist interpretation   Cardiac Monitoring: / EKG:  The patient was maintained on a cardiac monitor.  I personally viewed and interpreted the cardiac monitored which showed an underlying rhythm of: Sinus rhythm   Consultations Obtained:  N/a   Problem List / ED Course /  Critical interventions / Medication management  Rib pain Reevaluation of the patient showed that the patient stayed the same I have reviewed the patients home medicines and have made adjustments as needed   Social Determinants of Health:  Denies tobacco, licit drug use.   Test / Admission - Considered:  Rib pain Vitals signs significant for hypertension. Otherwise within normal range and stable throughout visit. Imaging studies significant for: See above 77 year old female presents with bilateral rib pain after coughing episode earlier today.  Patient was without chest pain before and cough due to reported aspiration of saliva.  Coughing is since ceased as well as near resolution of lateral rib pain with administration of nonsteroidal medication appointment Toradol by EMS.  Patient with mild overlying rib tenderness bilaterally in affected areas.  No  evidence of acute fracture on x-ray imaging with no palpable deformity on physical exam.  No evidence of pneumothorax.  Further workup deemed necessary at this time.  Most likely secondary to musculoskeletal injury.  Will recommend treatment of pain with nonsteroidal medications as well as follow-up with primary care for reassessment of symptoms.  Treatment plan discussed at length with patient and she acknowledged understanding was agreeable to said plan. Worrisome signs and symptoms were discussed with the patient, and the patient acknowledged understanding to return to the ED if noticed. Patient was stable upon discharge.          Final Clinical Impression(s) / ED Diagnoses Final diagnoses:  Rib pain    Rx / DC Orders ED Discharge Orders          Ordered    ibuprofen (ADVIL) 600 MG tablet  Every 6 hours PRN        06/16/22 1251              Peter Garter, [place] 06/16/22 1321    Pricilla Loveless, MD 06/18/22 [id number]

## 2022-06-16 NOTE — Patient Instructions (Signed)
[facility]  Discharge Instructions: Thank you for choosing [facility] to provide your oncology and hematology care.  If you have a lab appointment with the Cancer Center - please note that after April 8th, 2024, all labs will be drawn in the cancer center.  You do not have to check in or register with the main entrance as you have in the past but will complete your check-in in the cancer center.  Wear comfortable clothing and clothing appropriate for easy access to any Portacath or PICC line.   We strive to give you quality time with your provider. You may need to reschedule your appointment if you arrive late (15 or more minutes).  Arriving late affects you and other patients whose appointments are after yours.  Also, if you miss three or more appointments without notifying the office, you may be dismissed from the clinic at the provider's discretion.      For prescription refill requests, have your pharmacy contact our office and allow 72 hours for refills to be completed.    Today you received the following chemotherapy and/or immunotherapy agents phlebotomy. Therapeutic Phlebotomy, Care After The following information offers guidance on how to care for yourself after your procedure. Your health care provider may also give you more specific instructions. If you have problems or questions, contact your health care provider. What can I expect after the procedure? After therapeutic phlebotomy, it is common to have: Light-headedness or dizziness. You may feel faint. Nausea. Tiredness (fatigue). Follow these instructions at home: Eating and drinking Be sure to eat well-balanced meals for the next 24 hours. Drink enough fluid to keep your urine pale yellow. Avoid drinking alcohol on the day that you had the procedure. Activity  Return to your normal activities as told by your health care provider. Most people can go back to their normal activities right  away. Avoid activities that take a lot of effort for about 5 hours after the procedure. Athletes should avoid strenuous exercise for at least 12 hours. Avoid heavy lifting or pulling for about 5 hours after the procedure. Do not lift anything that is heavier than 10 lb (4.5 kg). Change positions slowly for the remainder of the day, like from sitting to standing. This can help prevent light-headedness or fainting. If you feel light-headed, lie down until the feeling goes away. Needle insertion site care  Keep your bandage (dressing) dry. You can remove the bandage after about 5 hours or as told by your health care provider. If you have bleeding from the needle insertion site, raise (elevate) your arm and press firmly on the site until the bleeding stops. If you have bruising at the site, apply ice to the area. To do this: Put ice in a plastic bag. Place a towel between your skin and the bag. Leave the ice on for 20 minutes, 2-3 times a day for the first 24 hours. Remove the ice if your skin turns bright red so you do not damage the area. If the swelling does not go away after 24 hours, apply a warm, moist cloth (warm compress) to the area for 20 minutes, 2-3 times a day. General instructions Do not use any products that contain nicotine or tobacco, like cigarettes, chewing tobacco, and vaping devices, such as e-cigarettes, for at least 30 minutes after the procedure. If you need help quitting, ask your health care provider. Keep all follow-up visits. You may need to continue having regular blood tests   and therapeutic phlebotomy treatments as directed. Contact a health care provider if: You have redness, swelling, or pain at the needle insertion site. Fluid or blood is coming from the needle insertion site. Pus or a bad smell is coming from the needle insertion site. The needle insertion site feels warm to the touch. You feel light-headed, dizzy, or nauseous, and the feeling does not go  away. You have new bruising at the needle insertion site. You feel weaker than normal. You have a fever or chills. Get help right away if: You have chest pain. You have trouble breathing. You have severe nausea or vomiting. Summary After the procedure, it is common to have some light-headedness, dizziness, nausea, or tiredness (fatigue). Be sure to eat well-balanced meals for the next 24 hours. Drink enough fluid to keep your urine pale yellow. Return to your normal activities as told by your health care provider. Keep all follow-up visits. You may need to continue having regular blood tests and therapeutic phlebotomy treatments as directed. This information is not intended to replace advice given to you by your health care provider. Make sure you discuss any questions you have with your health care provider. Document Revised: 07/23/2020 Document Reviewed: 07/23/2020 Elsevier Patient Education  2023 Elsevier Inc.       To help prevent nausea and vomiting after your treatment, we encourage you to take your nausea medication as directed.  BELOW ARE SYMPTOMS THAT SHOULD BE REPORTED IMMEDIATELY: *FEVER GREATER THAN 100.4 F (38 C) OR HIGHER *CHILLS OR SWEATING *NAUSEA AND VOMITING THAT IS NOT CONTROLLED WITH YOUR NAUSEA MEDICATION *UNUSUAL SHORTNESS OF BREATH *UNUSUAL BRUISING OR BLEEDING *URINARY PROBLEMS (pain or burning when urinating, or frequent urination) *BOWEL PROBLEMS (unusual diarrhea, constipation, pain near the anus) TENDERNESS IN MOUTH AND THROAT WITH OR WITHOUT PRESENCE OF ULCERS (sore throat, sores in mouth, or a toothache) UNUSUAL RASH, SWELLING OR PAIN  UNUSUAL VAGINAL DISCHARGE OR ITCHING   Items with * indicate a potential emergency and should be followed up as soon as possible or go to the Emergency Department if any problems should occur.  Please show the CHEMOTHERAPY ALERT CARD or IMMUNOTHERAPY ALERT CARD at check-in to the Emergency Department and triage  nurse.  Should you have questions after your visit or need to cancel or reschedule your appointment, please contact [facility] [phone]  and follow the prompts.  Office hours are 8:00 a.m. to 4:30 p.m. Monday - Friday. Please note that voicemails left after 4:00 p.m. may not be returned until the following business day.  We are closed weekends and major holidays. You have access to a nurse at all times for urgent questions. Please call the main number to the clinic [phone] and follow the prompts.  For any non-urgent questions, you may also contact your provider using MyChart. We now offer e-Visits for anyone 18 and older to request care online for non-urgent symptoms. For details visit [email or web address].   Also download the MyChart app! Go to the app store, search "MyChart", open the app, select , and log in with your MyChart username and password.   

## 2022-06-16 NOTE — Progress Notes (Signed)
[name] [name]  presents for therapeutic phlebotomy per MD orders. Last HGB 16.1 / HCT 49.3 on 04-30-2022 . Patient presented  Blood pressure elevated on arrival 185/88. Message sent to R.Pennington PA pertaining to blood pressure. Patient presented to the ED today by EMS due to "Choking on her own saliva" per patient's words.      Procedure started at 15:08 pm and ended at 15:30 pm. 500 mls of blood removed. Patient denies any dizziness , lightheadedness, or feeling faint.  Gauze and coban applied to site. Vital signs stable at completion of procedure. Patient has no complaints at this time. Alert and oriented x 3. Discharged in stable condition.

## 2022-06-16 NOTE — Discharge Instructions (Signed)
Note the workup today was overall reassuring.  No evidence of rib fracture or collapsed lung.  Suspect your pain to be musculoskeletal.  Will treat with nonsteroidal medication in the form of ibuprofen.  Recommend follow-up with primary care for reassessment of your symptoms.  Please not hesitate to return to emergency department for worrisome signs or symptoms we discussed become apparent.

## 2022-06-16 NOTE — ED Triage Notes (Signed)
Pt reports she got choked on her own saliva and coughed, then immediately had bad bilateral rib cage pain.  She received 15mg  of Toradol by Ems and is feeling better now.

## 2022-06-17 ENCOUNTER — Telehealth: Payer: Self-pay

## 2022-06-17 NOTE — Transitions of Care (Post Inpatient/ED Visit) (Signed)
   06/17/2022  Name: [name] [name] MRN: [id number] DOB: [birth year]  Today's TOC FU Call Status: Today's TOC FU Call Status:: Unsuccessful Call (2nd Attempt) Unsuccessful Call (2nd Attempt) Date: 06/17/22  Attempted to reach the patient regarding the most recent Inpatient/ED visit.  Follow Up Plan: Additional outreach attempts will be made to reach the patient to complete the Transitions of Care (Post Inpatient/ED visit) call.     Antionette Fairy, RN,BSN,CCM [facility] Care Management Care Management Community Coordinator Direct Phone: [phone] Toll Free: [phone] Fax: [phone]

## 2022-06-18 ENCOUNTER — Telehealth: Payer: Self-pay

## 2022-06-18 NOTE — Transitions of Care (Post Inpatient/ED Visit) (Signed)
   06/18/2022  Name: [name] [name] MRN: [id number] DOB: 05/17/1945  Today's TOC FU Call Status: Today's TOC FU Call Status:: Unsuccessful Call (3rd Attempt) Unsuccessful Call (3rd Attempt) Date: 06/18/22  Red on EMMI-ED Discharge Alert Date & Reason:06/14/22 "Scheduled follow-up appt? No"  Attempted to reach the patient regarding the most recent Inpatient/ED visit.  Follow Up Plan: No further outreach attempts will be made at this time. We have been unable to contact the patient.    Antionette Fairy, RN,BSN,CCM [facility] Care Management Care Management Community Coordinator Direct Phone: [phone] Toll Free: [phone] Fax: [phone]

## 2022-06-23 ENCOUNTER — Telehealth: Payer: Self-pay

## 2022-06-23 ENCOUNTER — Inpatient Hospital Stay: Payer: Medicare HMO

## 2022-06-23 DIAGNOSIS — D45 Polycythemia vera: Secondary | ICD-10-CM

## 2022-06-23 DIAGNOSIS — D751 Secondary polycythemia: Secondary | ICD-10-CM

## 2022-06-23 DIAGNOSIS — Z1589 Genetic susceptibility to other disease: Secondary | ICD-10-CM

## 2022-06-23 DIAGNOSIS — I129 Hypertensive chronic kidney disease with stage 1 through stage 4 chronic kidney disease, or unspecified chronic kidney disease: Secondary | ICD-10-CM | POA: Diagnosis not present

## 2022-06-23 DIAGNOSIS — N1832 Chronic kidney disease, stage 3b: Secondary | ICD-10-CM | POA: Diagnosis not present

## 2022-06-23 DIAGNOSIS — R7402 Elevation of levels of lactic acid dehydrogenase (LDH): Secondary | ICD-10-CM | POA: Diagnosis not present

## 2022-06-23 DIAGNOSIS — Z800 Family history of malignant neoplasm of digestive organs: Secondary | ICD-10-CM | POA: Diagnosis not present

## 2022-06-23 DIAGNOSIS — Z90710 Acquired absence of both cervix and uterus: Secondary | ICD-10-CM | POA: Diagnosis not present

## 2022-06-23 LAB — CBC WITH DIFFERENTIAL/PLATELET
Abs Immature Granulocytes: 0.01 10*3/uL (ref 0.00–0.07)
Basophils Absolute: 0 10*3/uL (ref 0.0–0.1)
Basophils Relative: 0 %
Eosinophils Absolute: 0.3 10*3/uL (ref 0.0–0.5)
Eosinophils Relative: 6 %
HCT: 48 % — ABNORMAL HIGH (ref 36.0–46.0)
Hemoglobin: 15 g/dL (ref 12.0–15.0)
Immature Granulocytes: 0 %
Lymphocytes Relative: 34 %
Lymphs Abs: 1.7 10*3/uL (ref 0.7–4.0)
MCH: 30.2 pg (ref 26.0–34.0)
MCHC: 31.3 g/dL (ref 30.0–36.0)
MCV: 96.6 fL (ref 80.0–100.0)
Monocytes Absolute: 0.2 10*3/uL (ref 0.1–1.0)
Monocytes Relative: 4 %
Neutro Abs: 2.8 10*3/uL (ref 1.7–7.7)
Neutrophils Relative %: 56 %
Platelets: 375 10*3/uL (ref 150–400)
RBC: 4.97 MIL/uL (ref 3.87–5.11)
RDW: 15.2 % (ref 11.5–15.5)
WBC: 5.1 10*3/uL (ref 4.0–10.5)
nRBC: 0 % (ref 0.0–0.2)

## 2022-06-23 LAB — COMPREHENSIVE METABOLIC PANEL
ALT: 19 U/L (ref 0–44)
AST: 24 U/L (ref 15–41)
Albumin: 4 g/dL (ref 3.5–5.0)
Alkaline Phosphatase: 58 U/L (ref 38–126)
Anion gap: 9 (ref 5–15)
BUN: 25 mg/dL — ABNORMAL HIGH (ref 8–23)
CO2: 26 mmol/L (ref 22–32)
Calcium: 9 mg/dL (ref 8.9–10.3)
Chloride: 101 mmol/L (ref 98–111)
Creatinine, Ser: 1.13 mg/dL — ABNORMAL HIGH (ref 0.44–1.00)
GFR, Estimated: 50 mL/min — ABNORMAL LOW (ref >60)
Glucose, Bld: 125 mg/dL — ABNORMAL HIGH (ref 70–99)
Potassium: 3.8 mmol/L (ref 3.5–5.1)
Sodium: 136 mmol/L (ref 135–145)
Total Bilirubin: 0.8 mg/dL (ref 0.3–1.2)
Total Protein: 7.6 g/dL (ref 6.5–8.1)

## 2022-06-23 LAB — LACTATE DEHYDROGENASE: LDH: 216 U/L — ABNORMAL HIGH (ref 98–192)

## 2022-06-23 NOTE — Telephone Encounter (Signed)
Transition Care Management Unsuccessful Follow-up Telephone Call  Date of discharge and from where:  [facility]  Attempts:  1st Attempt  Reason for unsuccessful TCM follow-up call:  Left voice message   Lenard Forth [facility] Guide, [facility] [phone] [address] [place], [place], [place] [address] Phone: [phone] Email: [email or web address]

## 2022-06-25 ENCOUNTER — Inpatient Hospital Stay: Payer: Medicare HMO

## 2022-06-25 DIAGNOSIS — I129 Hypertensive chronic kidney disease with stage 1 through stage 4 chronic kidney disease, or unspecified chronic kidney disease: Secondary | ICD-10-CM | POA: Diagnosis not present

## 2022-06-25 DIAGNOSIS — D45 Polycythemia vera: Secondary | ICD-10-CM | POA: Diagnosis not present

## 2022-06-25 DIAGNOSIS — N2581 Secondary hyperparathyroidism of renal origin: Secondary | ICD-10-CM | POA: Diagnosis not present

## 2022-06-25 DIAGNOSIS — I700 Atherosclerosis of aorta: Secondary | ICD-10-CM | POA: Diagnosis not present

## 2022-06-25 DIAGNOSIS — I5032 Chronic diastolic (congestive) heart failure: Secondary | ICD-10-CM | POA: Diagnosis not present

## 2022-06-25 DIAGNOSIS — N1831 Chronic kidney disease, stage 3a: Secondary | ICD-10-CM | POA: Diagnosis not present

## 2022-06-28 ENCOUNTER — Telehealth: Payer: Self-pay

## 2022-06-28 NOTE — Telephone Encounter (Signed)
Transition Care Management Follow-up Telephone Call Date of discharge and from where: [facility]  [facility] How have you been since you were released from the hospital? Doing better swelling in arm went down Any questions or concerns? No  Items Reviewed: Did the pt receive and understand the discharge instructions provided? Yes  Medications obtained and verified? Yes  Other? No  Any new allergies since your discharge? No  Dietary orders reviewed? No Do you have support at home? Yes     Follow up appointments reviewed:  PCP Hospital f/u appt confirmed? No  Scheduled to see  on  @ . Specialist Hospital f/u appt confirmed?  Scheduled to see  on  @ . Are transportation arrangements needed? No  If their condition worsens, is the pt aware to call PCP or go to the Emergency Dept.? Yes Was the patient provided with contact information for the PCP's office or ED? Yes Was to pt encouraged to call back with questions or concerns? Yes

## 2022-06-29 NOTE — Progress Notes (Unsigned)
[name] [address], [place] [address]   CLINIC:  Medical Oncology/Hematology  PCP:  Billie Lade, MD [address] [place] [address] [phone]   REASON FOR VISIT:  Follow-up for JAK2 polycythemia vera  PRIOR THERAPY: None  CURRENT THERAPY: Hydrea 500 mg daily + aspirin 81 mg + phlebotomy  INTERVAL HISTORY:   [name] [name] 77 y.o. female returns for routine follow-up of her polycythemia vera.  She was last seen by Rojelio Brenner PA-C on 05/03/2022.  At today's visit, she reports feeling fairly well.  She has continued with phlebotomy every other week, and is tolerating this well.  She is prescribed Hydrea 500 mg daily and is compliant with this.  She reports some darkening of her nails and decreased growth of nails, but denies any other adverse effects such as fatigue, mouth sores, skin sores, or GI upset.  She has good energy, and stays active doing cardio and weightlifting throughout the week.  She takes aspirin "sometimes," but cannot specify how often.  Overall, she feels better after starting phlebotomy, and reports decreased headaches and improved tinnitus.  She has also been following closely with her PCP (Dr. Durwin Nora) for management of hypertension, which has significantly improved after new medications and phlebotomy.   She denies any aquagenic pruritus, erythromelalgia, or B symptoms.  She has 100% energy and 100% appetite. She endorses that she is maintaining a stable weight.  ASSESSMENT & PLAN:  1.   JAK2 positive polycythemia vera - Seen at request of Dr. Wolfgang Phoenix for elevated hemoglobin and platelet count (CBC from 11/20/2021 showed Hgb 16.9/hematocrit 51.1, platelets 613) - Review of EMR shows platelet count elevated since August 2015,  Hemoglobin elevated since January 2019 - No history of DVT, PE, MI, or CVA. - Hematology work-up (11/20/2021):  JAK2 V617F positive.  Low erythropoietin 1.8 - Hydrea started on 01/14/2022.   Current dose 500 mg daily - Aspirin 81 mg recommended with extensive discussion regarding medical indication for antiplatelet agents.  Patient states that she "did her own research and knows that it could give her a heart attack," so she only takes it sometimes - Unable to increase Hydrea past 1000 mg daily due to side effects, therefore started on phlebotomy in February 2024, which she is tolerating well. - Most recent labs (06/23/2022): Blood counts improved with Hgb 15.0/hematocrit 48.0%).  Normal WBC and platelets.  LDH mildly elevated 216.  CMP at baseline. - She is feeling better (decreased headaches and tinnitus, improved blood pressure) after starting phlebotomy - Extensive discussion regarding diagnosis of polycythemia vera, including risk of thrombosis (CVA, MI, DVT, PE) as well as risk of myelofibrosis and leukemia in some patients. - Based on her age, she is considered high risk and therefore recommended cytoreductive therapy with hydroxyurea.  Due to side effects from increased dose of hydroxyurea, we will use combination of Hydrea and phlebotomy to achieve target blood counts. Patient was inquiring about treatment with PTG-300 ([place]), which is currently only available in clinical trials.  Discussed with patient that clinical trials are available in [place], and if she would like to pursue this we can place referral. - PLAN:  Continue Hydrea 500 mg daily  - Continue phlebotomy every other week x 2 months. - Recommend aspirin 81 mg daily (patient refuses) - Labs in 3 months = CBC/D, CMP, LDH  - OFFICE visit 1 to 2 days after labs (with same-day phlebotomy)   2.  Blood  pressure  - Initial BP today 180/108 prior to visit, repeat BP after office visit improved to 169/80 Elevated blood pressure today 163/83, which is in keeping with past readings per review of EMR - No neurologic deficits, headache, acute chest pain, or dyspnea - PLAN: Continue hypertension management per PCP  and nephrologist - Educated on alarm symptoms that would prompt immediate medical evaluation in emergency department   3.  Social/family history: - Lives at home with her family.  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  She was never smoker. - No family history of leukemias or malignancies.   PLAN SUMMARY: >> Therapeutic phlebotomy every 2 weeks >> Monthly CBC  >> Labs in 3 months = CBC/D, CMP, LDH >> Next day OFFICE visit in 3 months (same day as phlebotomy, 1 day after labs)    REVIEW OF SYSTEMS:  Review of Systems  Constitutional:  Negative for appetite change, chills, diaphoresis, fatigue, fever and unexpected weight change.  HENT:   Negative for lump/mass, nosebleeds and tinnitus (resolved).   Eyes:  Negative for eye problems.  Respiratory:  Negative for cough, hemoptysis and shortness of breath.   Cardiovascular:  Negative for chest pain, leg swelling and palpitations.  Gastrointestinal:  Negative for abdominal pain, blood in stool, constipation, diarrhea, nausea and vomiting.  Genitourinary:  Negative for hematuria.   Musculoskeletal:  Negative for back pain.  Skin: Negative.   Neurological:  Positive for headaches (at times, associated with stress). Negative for dizziness and light-headedness.  Hematological:  Does not bruise/bleed easily.  Psychiatric/Behavioral:  Negative for sleep disturbance.      PHYSICAL EXAM:  ECOG PERFORMANCE STATUS: 1 - Symptomatic but completely ambulatory There were no vitals filed for this visit. There were no vitals filed for this visit. Physical Exam Constitutional:      Appearance: Normal appearance. She is obese.  Cardiovascular:     Heart sounds: Normal heart sounds.  Pulmonary:     Breath sounds: Normal breath sounds.  Neurological:     General: No focal deficit present.     Mental Status: Mental status is at baseline.  Psychiatric:        Behavior:  Behavior normal. Behavior is cooperative.    PAST MEDICAL/SURGICAL HISTORY:  Past Medical History:  Diagnosis Date   Anxiety    Dysphagia 09/03/2020   Essential hypertension    HLD (hyperlipidemia)    Hx of statin intolerance   Hypothyroidism    NSTEMI (non-ST elevated myocardial infarction) (HCC) 2015   a.  Related to occlusion of the distal Cx. The mid to distal OM1 also contains a significant stenosis that is best treated with medical therapy. Patent RCA/LAD with tortuosity. LM patent. EF 55%.    Polycythemia vera (HCC)    Stage 3b chronic kidney disease    Past Surgical History:  Procedure Laterality Date   ABDOMINAL HYSTERECTOMY     CARDIAC CATHETERIZATION  ~ 2000   CORONARY ANGIOPLASTY  09/20/2013   LEFT HEART CATHETERIZATION WITH CORONARY ANGIOGRAM N/A 09/20/2013   Procedure: LEFT HEART CATHETERIZATION WITH CORONARY ANGIOGRAM;  Surgeon: Lesleigh Noe, MD;  Location[facility] C[facility] LAB;  Service: Cardiovascular;  Laterality: N/A;   LEFT HEART CATHETERIZATION WITH CORONARY ANGIOGRAM N/A 09/27/2013   Procedure: LEFT HEART CATHETERIZATION WITH CORONARY ANGIOGRAM;  Surgeon: Micheline Chapman, MD;  Location: [facility] CATH LAB;  Service: Cardiovascular;  Laterality: N/A;   THYROIDECTOMY  10/08/2011   Procedure: THYROIDECTOMY;  Surgeon: Dalia Heading, MD;  Location: [facility] ORS;  Service: General;  Laterality: N/A;  Total   VAGINAL HYSTERECTOMY  1993    SOCIAL HISTORY:  Social History   Socioeconomic History   Marital status: Widowed    Spouse name: Not on file   Number of children: Not on file   Years of education: Not on file   Highest education level: Not on file  Occupational History   Not on file  Tobacco Use   Smoking status: Never   Smokeless tobacco: Never  Vaping Use   Vaping Use: Never used  Substance and Sexual Activity   Alcohol use: No    Alcohol/week: 0.0 standard drinks of alcohol   Drug use: No   Sexual activity: Never  Other Topics Concern   Not on file  Social  History Narrative   ** Merged History Encounter **  Lives at home with family.  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      Social Determinants of Health   Financial Resource Strain: Low Risk  (07/21/2020)   Overall Financial Resource Strain (CARDIA)    Difficulty of Paying Living Expenses: Not very hard  Food Insecurity: No Food Insecurity (07/21/2020)   Hunger Vital Sign    Worried About Running Out of Food in the Last Year: Never true    Ran Out of Food in the Last Year: Never true  Transportation Needs: No Transportation Needs (09/08/2020)   PRAPARE - Administrator, Civil Service (Medical): No    Lack of Transportation (Non-Medical): No  Physical Activity: Sufficiently Active (03/11/2020)   Exercise Vital Sign    Days of Exercise per Week: 5 days    Minutes of Exercise per Session: 30 min  Stress: No Stress Concern Present (03/11/2020)   [employer] of Occupational Health - Occupational Stress Questionnaire    Feeling of Stress : Not at all  Social Connections: Not on file  Intimate Partner Violence: Not on file    FAMILY HISTORY:  Family History  Problem Relation Age of Onset   Colon cancer Father    Thyroid disease Neg Hx     CURRENT MEDICATIONS:  Outpatient Encounter Medications as of 06/30/2022  Medication Sig   aspirin EC 81 MG tablet Take 1 tablet (81 mg total) by mouth daily. Swallow whole.   carvedilol (COREG) 25 MG tablet Take 1.5 tablets (37.5 mg total) by mouth 2 (two) times daily.   cephALEXin (KEFLEX) 500 MG capsule Take 1 capsule (500 mg total) by mouth 4 (four) times daily.   chlorthalidone (HYGROTON) 25 MG tablet Take 1 tablet (25 mg total) by mouth daily.   hydroxyurea (HYDREA) 500 MG capsule Take 1 capsule (500 mg total) by mouth daily. May take with food to minimize GI side effects.   ibuprofen (ADVIL) 600 MG tablet Take 1 tablet (600 mg total) by mouth every 6 (six) hours as needed.   levothyroxine (SYNTHROID) 125 MCG tablet Take 1 tablet (125 mcg  total) by mouth daily before breakfast. 1 HOUR PRIOR TO OTHER MEDICATIONS   minoxidil (LONITEN) 10 MG tablet Take 1 tablet (10 mg total) by mouth daily.   spironolactone (ALDACTONE) 25 MG tablet Take 1 tablet (25 mg total) by mouth daily.   No facility-administered encounter medications on file as of 06/30/2022.    ALLERGIES:  Allergies  Allergen Reactions   Fluviral [Influenza Virus Vaccine] Shortness Of Breath, Swelling and Other (See Comments)    Including back pain that radiates in  upper extremeties   Haemophilus Influenzae Other (See Comments), Shortness Of Breath and Swelling    Including back pain that radiates in upper extremeties   Hydralazine Shortness Of Breath   Amlodipine     Lip swelling per patient.    Lisinopril     Dr. Felecia Shelling stopped due to side effects per patient.  Could remember what side effects were.     Ranexa [Ranolazine]     Chest pain    LABORATORY DATA:  I have reviewed the labs as listed.  CBC    Component Value Date/Time   WBC 5.1 06/23/2022 1339   RBC 4.97 06/23/2022 1339   HGB 15.0 06/23/2022 1339   HCT 48.0 (H) 06/23/2022 1339   PLT 375 06/23/2022 1339   MCV 96.6 06/23/2022 1339   MCH 30.2 06/23/2022 1339   MCHC 31.3 06/23/2022 1339   RDW 15.2 06/23/2022 1339   LYMPHSABS 1.7 06/23/2022 1339   MONOABS 0.2 06/23/2022 1339   EOSABS 0.3 06/23/2022 1339   BASOSABS 0.0 06/23/2022 1339      Latest Ref Rng & Units 06/23/2022    1:39 PM 04/30/2022    1:11 PM 03/30/2022    9:09 AM  CMP  Glucose 70 - 99 mg/dL 188  93  416   BUN 8 - 23 mg/dL 25  21  18    Creatinine 0.44 - 1.00 mg/dL 6.06  3.01  6.01   Sodium 135 - 145 mmol/L 136  136  140   Potassium 3.5 - 5.1 mmol/L 3.8  4.2  4.3   Chloride 98 - 111 mmol/L 101  104  104   CO2 22 - 32 mmol/L 26  24  27    Calcium 8.9 - 10.3 mg/dL 9.0  8.9  9.0   Total Protein 6.5 - 8.1 g/dL 7.6  7.5    Total Bilirubin 0.3 - 1.2 mg/dL 0.8  0.8    Alkaline Phos 38 - 126 U/L 58  53    AST 15 - 41 U/L 24  22     ALT 0 - 44 U/L 19  21      DIAGNOSTIC IMAGING:  I have independently reviewed the relevant imaging and discussed with the patient.   WRAP UP:  All questions were answered. The patient knows to call the clinic with any problems, questions or concerns.  Medical decision making: Moderate  Time spent on visit: I spent 20 minutes counseling the patient face to face. The total time spent in the appointment was 30 minutes and more than 50% was on counseling.  Carnella Guadalajara, PA-C  06/30/22 2:41 PM

## 2022-06-30 ENCOUNTER — Inpatient Hospital Stay: Payer: Medicare HMO

## 2022-06-30 ENCOUNTER — Other Ambulatory Visit: Payer: Self-pay

## 2022-06-30 ENCOUNTER — Inpatient Hospital Stay (HOSPITAL_BASED_OUTPATIENT_CLINIC_OR_DEPARTMENT_OTHER): Payer: Medicare HMO | Admitting: Physician Assistant

## 2022-06-30 VITALS — BP 169/80 | HR 70 | Temp 97.7°F | Resp 17 | Ht 63.0 in | Wt 172.2 lb

## 2022-06-30 VITALS — BP 137/92 | HR 73 | Resp 18

## 2022-06-30 DIAGNOSIS — D45 Polycythemia vera: Secondary | ICD-10-CM

## 2022-06-30 DIAGNOSIS — R7402 Elevation of levels of lactic acid dehydrogenase (LDH): Secondary | ICD-10-CM | POA: Diagnosis not present

## 2022-06-30 DIAGNOSIS — Z800 Family history of malignant neoplasm of digestive organs: Secondary | ICD-10-CM | POA: Diagnosis not present

## 2022-06-30 DIAGNOSIS — N1832 Chronic kidney disease, stage 3b: Secondary | ICD-10-CM | POA: Diagnosis not present

## 2022-06-30 DIAGNOSIS — I129 Hypertensive chronic kidney disease with stage 1 through stage 4 chronic kidney disease, or unspecified chronic kidney disease: Secondary | ICD-10-CM | POA: Diagnosis not present

## 2022-06-30 DIAGNOSIS — Z90710 Acquired absence of both cervix and uterus: Secondary | ICD-10-CM | POA: Diagnosis not present

## 2022-06-30 DIAGNOSIS — Z1589 Genetic susceptibility to other disease: Secondary | ICD-10-CM

## 2022-06-30 NOTE — Progress Notes (Signed)
Carman Ching presents today for theraputic phlebotomy per MD orders. Last hgb 15.0/hct 48.0 was on 06/23/22 prior to procedure. Pt reports eating before arrival. Procedure started at 1432. 500 grams of blood removed. Procedure ended at 1446. Gauze and coban applied to wrist site clean and dry. VSS upon completion of procedure. Pt denies dizziness, lightheadedness, or feeling faint.    After phlebotomy was done pt's blood pressure was 156/98 2nd check 139/107. Rebekah Pennington-PA-C made aware and to observe pt for another 15 minutes and re-check blood pressure. Pt was re-checked in 15 minutes 137/92. Pt denies headaches, dizziness, and shortness of breath.  Discharged in satisfactory condition with follow up instructions.

## 2022-06-30 NOTE — Patient Instructions (Signed)
[facility] at [facility] **VISIT SUMMARY & IMPORTANT INSTRUCTIONS **   You were seen today by Rojelio Brenner PA-C for your polycythemia vera.    POLYCYTHEMIA VERA Your blood counts look much better than at your previous appointments! Continue your Hydrea 500 mg once a day. We will continue phlebotomy once every 2 weeks for the next 3 months. I will see you for follow-up visit in 3 months with labs checked the day before.  FOLLOW-UP APPOINTMENT: Office visit in 3 months  ** Thank you for trusting me with your healthcare!  I strive to provide all of my patients with quality care at each visit.  If you receive a survey for this visit, I would be so grateful to you for taking the time to provide feedback.  Thank you in advance!  ~ Breezie Micucci                   Dr. Doreatha Massed   &   Rojelio Brenner, PA-C   - - - - - - - - - - - - - - - - - -    Thank you for choosing [facility] at [facility] to provide your oncology and hematology care.  To afford each patient quality time with our provider, please arrive at least 15 minutes before your scheduled appointment time.   If you have a lab appointment with the Cancer Center please come in thru the Main Entrance and check in at the main information desk.  You need to re-schedule your appointment should you arrive 10 or more minutes late.  We strive to give you quality time with our providers, and arriving late affects you and other patients whose appointments are after yours.  Also, if you no show three or more times for appointments you may be dismissed from the clinic at the providers discretion.     Again, thank you for choosing [facility].  Our hope is that these requests will decrease the amount of time that you wait before being seen by our physicians.       _____________________________________________________________  Should you have questions after your visit to [facility], please contact our office at [phone] and follow the prompts.  Our office hours are 8:00 a.m. and 4:30 p.m. Monday - Friday.  Please note that voicemails left after 4:00 p.m. may not be returned until the following business day.  We are closed weekends and major holidays.  You do have access to a nurse 24-7, just call the main number to the clinic [phone] and do not press any options, hold on the line and a nurse will answer the phone.    For prescription refill requests, have your pharmacy contact our office and allow 72 hours.

## 2022-06-30 NOTE — Patient Instructions (Signed)
[facility] [place]  Discharge Instructions: Thank you for choosing [facility] to provide your oncology and hematology care.  If you have a lab appointment with the Cancer Center - please note that after April 8th, 2024, all labs will be drawn in the cancer center.  You do not have to check in or register with the main entrance as you have in the past but will complete your check-in in the cancer center.  Wear comfortable clothing and clothing appropriate for easy access to any Portacath or PICC line.   We strive to give you quality time with your provider. You may need to reschedule your appointment if you arrive late (15 or more minutes).  Arriving late affects you and other patients whose appointments are after yours.  Also, if you miss three or more appointments without notifying the office, you may be dismissed from the clinic at the provider's discretion.      For prescription refill requests, have your pharmacy contact our office and allow 72 hours for refills to be completed.    Today you received the following chemotherapy and/or immunotherapy agents therapeutic phlebotomy      To help prevent nausea and vomiting after your treatment, we encourage you to take your nausea medication as directed.  BELOW ARE SYMPTOMS THAT SHOULD BE REPORTED IMMEDIATELY: *FEVER GREATER THAN 100.4 F (38 C) OR HIGHER *CHILLS OR SWEATING *NAUSEA AND VOMITING THAT IS NOT CONTROLLED WITH YOUR NAUSEA MEDICATION *UNUSUAL SHORTNESS OF BREATH *UNUSUAL BRUISING OR BLEEDING *URINARY PROBLEMS (pain or burning when urinating, or frequent urination) *BOWEL PROBLEMS (unusual diarrhea, constipation, pain near the anus) TENDERNESS IN MOUTH AND THROAT WITH OR WITHOUT PRESENCE OF ULCERS (sore throat, sores in mouth, or a toothache) UNUSUAL RASH, SWELLING OR PAIN  UNUSUAL VAGINAL DISCHARGE OR ITCHING   Items with * indicate a potential emergency and should be followed up as soon as possible  or go to the Emergency Department if any problems should occur.  Please show the CHEMOTHERAPY ALERT CARD or IMMUNOTHERAPY ALERT CARD at check-in to the Emergency Department and triage nurse.  Should you have questions after your visit or need to cancel or reschedule your appointment, please contact [facility] [place] [phone]  and follow the prompts.  Office hours are 8:00 a.m. to 4:30 p.m. Monday - Friday. Please note that voicemails left after 4:00 p.m. may not be returned until the following business day.  We are closed weekends and major holidays. You have access to a nurse at all times for urgent questions. Please call the main number to the clinic [phone] and follow the prompts.  For any non-urgent questions, you may also contact your provider using MyChart. We now offer e-Visits for anyone 18 and older to request care online for non-urgent symptoms. For details visit [email or web address].   Also download the MyChart app! Go to the app store, search "MyChart", open the app, select [place], and log in with your MyChart username and password.   

## 2022-07-13 ENCOUNTER — Inpatient Hospital Stay: Payer: Medicare HMO

## 2022-07-14 ENCOUNTER — Ambulatory Visit: Payer: Medicaid Other | Admitting: Nurse Practitioner

## 2022-07-14 NOTE — Progress Notes (Deleted)
Cardiology Office Note:    Date:  03/15/2022  ID:  [name] [name], [name] [name] 10-24-1945, MRN [id number]  PCP:  Billie Lade, MD   [facility] Providers Cardiologist:  Nona Dell, MD     Referring MD: Billie Lade, MD   CC: Here for follow-up  History of Present Illness:    [name] [name] is a 77 y.o. female with a hx of the following:  CAD, s/p NSTEMI HTN Daytime fatigue/sleepiness Hypothyroidism  Multinodular goiter Statin intolreance HLD Stage IIIb chronic kidney disease (sees kidney doctor)   Patient is a very pleasant 77 year old female with past medical history as mentioned above.  Last seen by Dr. Diona Browner on August 10, 2021 and was overall doing well from a cardiac perspective.  Since her last office visit with him, she noted that she had sinus infection, interval shingles and UTI.  Noted fluctuations in her blood pressure in office that day. No medication changes were made.  Was told to follow-up in 6 months.  01/26/2022 - I saw her for follow-up. Was diagnosed with polycythemia vera, was following Dr. Ellin Saba. Was receiving PO chemo. Described a fullness along her upper neck/chest that was attributed to medication, denied any exertional chest pain or other associated symptoms.  Was having blood pressure medication issues and has been recommended to follow-up with cardiology.  STOP-BANG score 3. Echo revealed EF 55 to 60%, severe asymmetric left ventricle hypertrophy, grade 1 DD, trivial MR, no other significant valvular abnormalities.  Was referred to advanced hypertension clinic as she has several allergies to antihypertensives.  Was set up for split-night sleep study.  03/15/22 - Doing well.  She has changed her diet.  She is now adhering to plant-based diet and performing regular exercise.  She admits to frequent headaches/migraines to give her issues.  Has not seen a neurologist in the past.  States systolic blood pressures average 140s-150s. Denies  any chest pain, shortness of breath, palpitations, syncope, presyncope, dizziness, orthopnea, PND, swelling or significant weight changes, acute bleeding, or claudication.   SH: In her free time, she enjoys going to yard sales.  She is retired from working in Engineer, site.    Past Medical History:  Diagnosis Date   Anxiety    Dysphagia 09/03/2020   Essential hypertension    HLD (hyperlipidemia)    Hx of statin intolerance   Hypothyroidism    NSTEMI (non-ST elevated myocardial infarction) (HCC) 2015   a.  Related to occlusion of the distal Cx. The mid to distal OM1 also contains a significant stenosis that is best treated with medical therapy. Patent RCA/LAD with tortuosity. LM patent. EF 55%.    Polycythemia vera (HCC)    Stage 3b chronic kidney disease     Past Surgical History:  Procedure Laterality Date   ABDOMINAL HYSTERECTOMY     CARDIAC CATHETERIZATION  ~ 2000   CORONARY ANGIOPLASTY  09/20/2013   LEFT HEART CATHETERIZATION WITH CORONARY ANGIOGRAM N/A 09/20/2013   Procedure: LEFT HEART CATHETERIZATION WITH CORONARY ANGIOGRAM;  Surgeon: Lesleigh Noe, MD;  Location: [facility] CATH LAB;  Service: Cardiovascular;  Laterality: N/A;   LEFT HEART CATHETERIZATION WITH CORONARY ANGIOGRAM N/A 09/27/2013   Procedure: LEFT HEART CATHETERIZATION WITH CORONARY ANGIOGRAM;  Surgeon: Micheline Chapman, MD;  Location: [facility] CATH LAB;  Service: Cardiovascular;  Laterality: N/A;   THYROIDECTOMY  10/08/2011   Procedure: THYROIDECTOMY;  Surgeon: Dalia Heading, MD;  Location: [facility] ORS;  Service: General;  Laterality: N/A;  Total  VAGINAL HYSTERECTOMY  1993    Current Medications: Current Meds  Medication Sig   aspirin EC 81 MG tablet Take 1 tablet (81 mg total) by mouth daily. Swallow whole.   chlorthalidone (HYGROTON) 25 MG tablet Take 0.5 tablets (12.5 mg total) by mouth daily.   hydroxyurea (HYDREA) 500 MG capsule Take 2 capsules (1,000 mg total) by mouth daily. May take with food to minimize GI side  effects.   levothyroxine (SYNTHROID) 125 MCG tablet Take 1 tablet (125 mcg total) by mouth daily before breakfast. 1 HOUR PRIOR TO OTHER MEDICATIONS   spironolactone (ALDACTONE) 25 MG tablet Take 1 tablet (25 mg total) by mouth daily.   carvedilol (COREG) 25 MG tablet Take 1 tablet (25 mg total) by mouth 2 (two) times daily.     Allergies:   Fluviral [influenza virus vaccine], Haemophilus influenzae, Hydralazine, Amlodipine, Lisinopril, and Ranexa [ranolazine]   Social History   Socioeconomic History   Marital status: Widowed    Spouse name: Not on file   Number of children: Not on file   Years of education: Not on file   Highest education level: Not on file  Occupational History   Not on file  Tobacco Use   Smoking status: Never   Smokeless tobacco: Never  Vaping Use   Vaping Use: Never used  Substance and Sexual Activity   Alcohol use: No    Alcohol/week: 0.0 standard drinks of alcohol   Drug use: No   Sexual activity: Never  Other Topics Concern   Not on file  Social History Narrative   ** Merged History Encounter **  Lives at home with family.  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      Social Determinants of Health   Financial Resource Strain: Low Risk  (07/21/2020)   Overall Financial Resource Strain (CARDIA)    Difficulty of Paying Living Expenses: Not very hard  Food Insecurity: No Food Insecurity (07/21/2020)   Hunger Vital Sign    Worried About Running Out of Food in the Last Year: Never true    Ran Out of Food in the Last Year: Never true  Transportation Needs: No Transportation Needs (09/08/2020)   PRAPARE - Administrator, Civil Service (Medical): No    Lack of Transportation (Non-Medical): No  Physical Activity: Sufficiently Active (03/11/2020)   Exercise Vital Sign    Days of Exercise per Week: 5 days    Minutes of Exercise per Session: 30 min  Stress: No Stress Concern Present (03/11/2020)   [employer] of Occupational Health - Occupational Stress  Questionnaire    Feeling of Stress : Not at all  Social Connections: Not on file     Family History: The patient's family history includes Colon cancer in her father. There is no history of Thyroid disease.  ROS:   Review of Systems  Constitutional: Negative.   HENT: Negative.    Eyes: Negative.   Respiratory: Negative.    Cardiovascular:  Negative for chest pain, palpitations, orthopnea, claudication, leg swelling and PND.  Gastrointestinal:  Positive for abdominal pain.       RUQ pain. Frequent "catching" sensation per patient's report.   Genitourinary: Negative.   Musculoskeletal: Negative.   Skin: Negative.   Neurological:  Positive for headaches. Negative for dizziness, tingling, tremors, sensory change, speech change, focal weakness, seizures, loss of consciousness and weakness.  Endo/Heme/Allergies: Negative.   Psychiatric/Behavioral: Negative.      Please see the history of present illness.    All  other systems reviewed and are negative.  EKGs/Labs/Other Studies Reviewed:    The following studies were reviewed today:   EKG:  EKG is not ordered today.   Echocardiogram on January 29, 2022:  1. Left ventricular ejection fraction, by estimation, is 55 to 60%. The  left ventricle has normal function. The left ventricle has no regional  wall motion abnormalities. 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 Left ventricular diastolic   parameters are consistent with Grade I diastolic dysfunction (impaired  relaxation).   2. Right ventricular systolic function is normal. The right ventricular  size is normal. There is normal pulmonary artery systolic pressure. The  estimated right ventricular systolic pressure is 23.6 mmHg.   3. Left atrial size was mildly dilated.   4. The mitral valve is normal in structure. Trivial mitral valve  regurgitation. No evidence of mitral stenosis.   5. The aortic valve is tricuspid. Aortic valve regurgitation is not   visualized. No aortic stenosis is present.   6. The inferior vena cava is normal in size with greater than 50%  respiratory variability, suggesting right atrial pressure of 3 mmHg.   Comparison(s): No prior Echocardiogram.  Bilateral renal artery ultrasound on April 24, 2020: Right: Normal size right kidney. Normal cortical thickness of right         kidney. Normal right Resisitive Index. No evidence of right         renal artery stenosis. RRV flow present.  Left:  Normal size of left kidney. Normal left Resistive Index.         Normal cortical thickness of the left kidney. No evidence of         left renal artery stenosis. LRV flow present.  Mesenteric:  Normal Celiac artery and Superior Mesenteric artery findings.  IVC is patent.  Myoview on September 24, 2015: Blood pressure demonstrated a hypertensive response to exercise. There was no ST segment deviation noted during stress. The study is normal. This is a low risk study. The left ventricular ejection fraction is normal (55-65%). Duke treadmill score of 7 consistent with low risk for major cardiac events.    2D echocardiogram on September 21, 2013: Left ventricle: The cavity size was normal. There was mild    concentric hypertrophy. Systolic function was normal. The    estimated ejection fraction was in the range of 55% to 60%. Wall    motion was normal; there were no regional wall motion    abnormalities. Doppler parameters are consistent with abnormal    left ventricular relaxation (grade 1 diastolic dysfunction).  - Left atrium: The atrium was mildly dilated.  - Pulmonary arteries: Systolic pressure was mildly increased. PA    peak pressure: 37 mm Hg (S).  - Pericardium, extracardiac: A small pericardial effusion was    identified along the right ventricular free wall.   Transthoracic echocardiography.  M-mode, complete 2D, spectral  Doppler, and color Doppler.  Birthdate:  Patient birthdate:  [birth year].  Age:  Patient  is 77 yr old.  Sex:  Gender: female.  BMI: 37 kg/m^2.  Blood pressure:     109/55  Patient status:  Inpatient.  Study date:  Study date: 09/21/2013. Study time: 09:24  AM.  Location:  Echo laboratory.    Recent Labs: 01/28/2022: TSH 6.530 06/23/2022: ALT 19; BUN 25; Creatinine, Ser 1.13; Hemoglobin 15.0; Platelets 375; Potassium 3.8; Sodium 136  Recent Lipid Panel    Component Value Date/Time   CHOL 172 01/28/2022 1013  TRIG 78 01/28/2022 1013   HDL 51 01/28/2022 1013   CHOLHDL 3.4 01/28/2022 1013   CHOLHDL 3.6 09/04/2019 1216   VLDL 13 09/04/2019 1216   LDLCALC 106 (H) 01/28/2022 1013     Risk Assessment/Calculations:      No BP recorded.  {Refresh Note OR Click here to enter BP  :1}***      STOP-Bang Score:  3  {    Physical Exam:    VS:  There were no vitals taken for this visit.    Wt Readings from Last 3 Encounters:  06/30/22 172 lb 3.2 oz (78.1 kg)  06/16/22 172 lb (78 kg)  06/12/22 179 lb (81.2 kg)     GEN: Overweight, 77 y.o. female in no acute distress HEENT: Normal NECK: No JVD; No carotid bruits CARDIAC: S1/S2, RRR, no murmurs, rubs, gallops; 2+ peripheral pulses throughout, strong equal bilaterally RESPIRATORY:  Clear to auscultation without rales, wheezing or rhonchi  MUSCULOSKELETAL:  No edema; No deformity  SKIN: Warm and dry NEUROLOGIC:  Alert and oriented x 3 PSYCHIATRIC:  Normal affect   ASSESSMENT:    No diagnosis found.  PLAN:    In order of problems listed above:  CAD, s/p NSTEMI Stable with no anginal symptoms. No indication for ischemic evaluation. Normal Lexiscan in 2017. Heart healthy diet and regular cardiovascular exercise encouraged. ED precautions discussed.  Continue current medication regimen.  HTN, resistant hypertension Initial BP on arrival 150/96, repeat BP 140/85. Similar BP readings at home. Unable to tolerate lisinopril and amlodipine. No evidence of renal artery stenosis. Discussed to monitor BP at home at least 2  hours after medications and sitting for 5-10 minutes. BP log given today and recommended to obtain Omron cuff. Will increase Carvedilol and and continue aldactone.  Previous referral made to Advanced HTN clinic, has upcoming appt next month. Heart healthy diet and regular cardiovascular exercise encouraged. Will await split night sleep study to be scheduled - see below.   3. Daytime sleepiness, hx of snoring STOP-Bang score 3 today. Says she has not had sleep study performed before. Pt is awaiting scheduling split night sleep study. Heart healthy diet and regular cardiovascular exercise encouraged.   Hypothyroidism, fatigue Recent TSH 6.530, T3 and T4 normal. Continue Synthroid.  Continue to follow with PCP who is managing this medication. Heart healthy diet and regular cardiovascular exercise encouraged.   HLD, statin intolerance Recent labs revealed total cholesterol 172, HDL 51, LDL 106, triglycerides 78.  She has a history of statin intolerance.  She has requested to work on diet and exercise before starting on medication.  Heart healthy diet and regular cardiovascular exercise encouraged.  Plan to repeat FLP in 6 months.  If LDL is still elevated at that time, plan to have discussion about starting Repatha/Praluent or Zetia.   Stage IIIa chronic kidney disease (sees kidney doctor) Most recent serum creatinine 1.25 with eGFR at 45.  She currently follows a kidney doctor.  Avoid nephrotoxic agents.  Encouraged to stay adequately well-hydrated.  6. Headaches Explains frequent episodes of what sounds like migraine headaches, difficult to manage medically. Denies any red flag signs or symptoms. Will place referral to Neurology.     7. Disposition: Follow up with Dr. Duke Salvia as scheduled. Follow-up with Dr. Diona Browner or me in 4-5 months or sooner if anything changes.  Medication Adjustments/Labs and Tests Ordered: Current medicines are reviewed at length with the patient today.  Concerns regarding  medicines are outlined above.  No orders of the  defined types were placed in this encounter.  No orders of the defined types were placed in this encounter.   Patient Instructions  Medication Instructions:  Your physician has recommended you make the following change in your medication:  Increase Coreg to 1.5 tablets twice daily   Labwork: None  Testing/Procedures: None  Follow-Up: Follow up with Dr. Diona Browner or Sharlene Dory, NP in 4-5 months. Keep your scheduled follow up with Dr. Duke Salvia.   Any Other Special Instructions Will Be Listed Below (If Applicable).  You have been referred to Neurology. They will contact you with your first appointment.    If you need a refill on your cardiac medications before your next appointment, please call your pharmacy.   Your physician has requested that you regularly monitor and record your blood pressure readings at home. Please use the same machine at the same time of day to check your readings and record them to bring to your follow-up visit.    Signed, Sharlene Dory, NP  07/14/2022 8:25 AM    [facility]

## 2022-07-15 ENCOUNTER — Ambulatory Visit: Payer: Medicare HMO

## 2022-07-15 ENCOUNTER — Ambulatory Visit: Payer: Medicaid Other | Admitting: Internal Medicine

## 2022-07-16 ENCOUNTER — Inpatient Hospital Stay: Payer: Medicare HMO

## 2022-07-16 ENCOUNTER — Ambulatory Visit: Payer: Medicaid Other | Admitting: Internal Medicine

## 2022-07-20 NOTE — Progress Notes (Deleted)
NEUROLOGY CONSULTATION NOTE  [name] [name] MRN: [id number] DOB: [birth year]  Referring provider: Sharlene Dory, NP Primary care provider: Christel Mormon, MD  Reason for consult:  migraine  Assessment/Plan:   ***   Subjective:  [name] [name] is a 77 year old female with HTN, hypothyroidism, Polycythemia vera, CKD stage 3b, CAD s/p NSTEMI and HLD with statin intolerance who presents for migraines.  History supplemented by referring provider's note.  ***  Past NSAIDS/analgesics:  *** Past antihypertensive medications:  metoprolol, labetolol, amlodipine, clonidine Past antidepressant medications:  duloxetine Past anticonvulsant medications:  gabapentin   Current NSAIDS/analgesics:  ASA 81mg  daily, ibuprofen 600mg  Current Antihypertensive medications:  carvedilol Other medications:  levothyroxine, hydroxyurea, chlorhalidone   Caffeine:  *** Alcohol:  *** Smoker:  *** Diet:  *** Exercise:  *** Depression:  ***; Anxiety:  *** Other pain:  *** Sleep hygiene:  *** Family history of headache:  ***      PAST MEDICAL HISTORY: Past Medical History:  Diagnosis Date   Anxiety    Dysphagia 09/03/2020   Essential hypertension    HLD (hyperlipidemia)    Hx of statin intolerance   Hypothyroidism    NSTEMI (non-ST elevated myocardial infarction) (HCC) 2015   a.  Related to occlusion of the distal Cx. The mid to distal OM1 also contains a significant stenosis that is best treated with medical therapy. Patent RCA/LAD with tortuosity. LM patent. EF 55%.    Polycythemia vera (HCC)    Stage 3b chronic kidney disease     PAST SURGICAL HISTORY: Past Surgical History:  Procedure Laterality Date   ABDOMINAL HYSTERECTOMY     CARDIAC CATHETERIZATION  ~ 2000   CORONARY ANGIOPLASTY  09/20/2013   LEFT HEART CATHETERIZATION WITH CORONARY ANGIOGRAM N/A 09/20/2013   Procedure: LEFT HEART CATHETERIZATION WITH CORONARY ANGIOGRAM;  Surgeon: Lesleigh Noe, MD;  Location: [facility] CATH  LAB;  Service: Cardiovascular;  Laterality: N/A;   LEFT HEART CATHETERIZATION WITH CORONARY ANGIOGRAM N/A 09/27/2013   Procedure: LEFT HEART CATHETERIZATION WITH CORONARY ANGIOGRAM;  Surgeon: Micheline Chapman, MD;  Location: [facility] CATH LAB;  Service: Cardiovascular;  Laterality: N/A;   THYROIDECTOMY  10/08/2011   Procedure: THYROIDECTOMY;  Surgeon: Dalia Heading, MD;  Location: [facility] ORS;  Service: General;  Laterality: N/A;  Total   VAGINAL HYSTERECTOMY  1993    MEDICATIONS: Current Outpatient Medications on File Prior to Visit  Medication Sig Dispense Refill   aspirin EC 81 MG tablet Take 1 tablet (81 mg total) by mouth daily. Swallow whole. 30 tablet 12   carvedilol (COREG) 25 MG tablet Take 1.5 tablets (37.5 mg total) by mouth 2 (two) times daily. 270 tablet 1   cephALEXin (KEFLEX) 500 MG capsule Take 1 capsule (500 mg total) by mouth 4 (four) times daily. 20 capsule 0   chlorthalidone (HYGROTON) 25 MG tablet Take 1 tablet (25 mg total) by mouth daily. 90 tablet 1   hydroxyurea (HYDREA) 500 MG capsule Take 1 capsule (500 mg total) by mouth daily. May take with food to minimize GI side effects. 30 capsule 1   ibuprofen (ADVIL) 600 MG tablet Take 1 tablet (600 mg total) by mouth every 6 (six) hours as needed. 30 tablet 0   levothyroxine (SYNTHROID) 125 MCG tablet Take 1 tablet (125 mcg total) by mouth daily before breakfast. 1 HOUR PRIOR TO OTHER MEDICATIONS 90 tablet 2   minoxidil (LONITEN) 10 MG tablet Take 1 tablet (10 mg total) by mouth daily. 30 tablet 2  spironolactone (ALDACTONE) 25 MG tablet Take 1 tablet (25 mg total) by mouth daily. 90 tablet 3   No current facility-administered medications on file prior to visit.    ALLERGIES: Allergies  Allergen Reactions   Fluviral [Influenza Virus Vaccine] Shortness Of Breath, Swelling and Other (See Comments)    Including back pain that radiates in upper extremeties   Haemophilus Influenzae Other (See Comments), Shortness Of Breath and  Swelling    Including back pain that radiates in upper extremeties   Hydralazine Shortness Of Breath   Amlodipine     Lip swelling per patient.    Lisinopril     Dr. Felecia Shelling stopped due to side effects per patient.  Could remember what side effects were.     Ranexa [Ranolazine]     Chest pain    FAMILY HISTORY: Family History  Problem Relation Age of Onset   Colon cancer Father    Thyroid disease Neg Hx     Objective:  *** General: No acute distress.  Patient appears well-groomed.   Head:  Normocephalic/atraumatic Eyes:  fundi examined but not visualized Neck: supple, no paraspinal tenderness, full range of motion Back: No paraspinal tenderness Heart: regular rate and rhythm Lungs: Clear to auscultation bilaterally. Vascular: No carotid bruits. Neurological Exam: Mental status: alert and oriented to person, place, and time, speech fluent and not dysarthric, language intact. Cranial nerves: CN I: not tested CN II: pupils equal, round and reactive to light, visual fields intact CN III, IV, VI:  full range of motion, no nystagmus, no ptosis CN V: facial sensation intact. CN VII: upper and lower face symmetric CN VIII: hearing intact CN IX, X: gag intact, uvula midline CN XI: sternocleidomastoid and trapezius muscles intact CN XII: tongue midline Bulk & Tone: normal, no fasciculations. Motor:  muscle strength 5/5 throughout Sensation:  Pinprick, temperature and vibratory sensation intact. Deep Tendon Reflexes:  2+ throughout,  toes downgoing.   Finger to nose testing:  Without dysmetria.   Heel to shin:  Without dysmetria.   Gait:  Normal station and stride.  Romberg negative.    Thank you for allowing me to take part in the care of this patient.  Shon Millet, DO  CC: ***

## 2022-07-21 ENCOUNTER — Inpatient Hospital Stay: Payer: Medicare HMO

## 2022-07-21 ENCOUNTER — Ambulatory Visit: Payer: Medicare HMO | Admitting: Neurology

## 2022-07-27 ENCOUNTER — Other Ambulatory Visit: Payer: Self-pay

## 2022-07-27 DIAGNOSIS — D45 Polycythemia vera: Secondary | ICD-10-CM

## 2022-07-28 ENCOUNTER — Inpatient Hospital Stay: Payer: Medicare HMO | Attending: Hematology

## 2022-07-28 ENCOUNTER — Inpatient Hospital Stay: Payer: Medicare HMO

## 2022-07-28 DIAGNOSIS — D45 Polycythemia vera: Secondary | ICD-10-CM | POA: Diagnosis not present

## 2022-07-28 DIAGNOSIS — J019 Acute sinusitis, unspecified: Secondary | ICD-10-CM | POA: Diagnosis not present

## 2022-07-28 DIAGNOSIS — E669 Obesity, unspecified: Secondary | ICD-10-CM | POA: Diagnosis not present

## 2022-07-28 DIAGNOSIS — I10 Essential (primary) hypertension: Secondary | ICD-10-CM | POA: Diagnosis not present

## 2022-07-28 DIAGNOSIS — Z6831 Body mass index (BMI) 31.0-31.9, adult: Secondary | ICD-10-CM | POA: Diagnosis not present

## 2022-07-28 LAB — CBC
HCT: 44.2 % (ref 36.0–46.0)
Hemoglobin: 13.9 g/dL (ref 12.0–15.0)
MCH: 28.5 pg (ref 26.0–34.0)
MCHC: 31.4 g/dL (ref 30.0–36.0)
MCV: 90.6 fL (ref 80.0–100.0)
Platelets: 265 10*3/uL (ref 150–400)
RBC: 4.88 MIL/uL (ref 3.87–5.11)
RDW: 14.6 % (ref 11.5–15.5)
WBC: 6.5 10*3/uL (ref 4.0–10.5)
nRBC: 0 % (ref 0.0–0.2)

## 2022-07-28 NOTE — Progress Notes (Signed)
Patient presents today for phlebotomy per MD orders. Phlebotomy procedure started at 1455 and ended at 1515. 500 cc removed. Patient tolerated procedure well. Procedure tolerated well and without incident. Discharged ambulatory in stable condition.

## 2022-07-28 NOTE — Patient Instructions (Signed)
[facility] [place]  Discharge Instructions: Thank you for choosing [facility] to provide your oncology and hematology care.  If you have a lab appointment with the Cancer Center - please note that after April 8th, 2024, all labs will be drawn in the cancer center.  You do not have to check in or register with the main entrance as you have in the past but will complete your check-in in the cancer center.  Wear comfortable clothing and clothing appropriate for easy access to any Portacath or PICC line.   We strive to give you quality time with your provider. You may need to reschedule your appointment if you arrive late (15 or more minutes).  Arriving late affects you and other patients whose appointments are after yours.  Also, if you miss three or more appointments without notifying the office, you may be dismissed from the clinic at the provider's discretion.      For prescription refill requests, have your pharmacy contact our office and allow 72 hours for refills to be completed.    Today you received the following chemotherapy and/or immunotherapy agents Phlebotomy      To help prevent nausea and vomiting after your treatment, we encourage you to take your nausea medication as directed.  BELOW ARE SYMPTOMS THAT SHOULD BE REPORTED IMMEDIATELY: *FEVER GREATER THAN 100.4 F (38 C) OR HIGHER *CHILLS OR SWEATING *NAUSEA AND VOMITING THAT IS NOT CONTROLLED WITH YOUR NAUSEA MEDICATION *UNUSUAL SHORTNESS OF BREATH *UNUSUAL BRUISING OR BLEEDING *URINARY PROBLEMS (pain or burning when urinating, or frequent urination) *BOWEL PROBLEMS (unusual diarrhea, constipation, pain near the anus) TENDERNESS IN MOUTH AND THROAT WITH OR WITHOUT PRESENCE OF ULCERS (sore throat, sores in mouth, or a toothache) UNUSUAL RASH, SWELLING OR PAIN  UNUSUAL VAGINAL DISCHARGE OR ITCHING   Items with * indicate a potential emergency and should be followed up as soon as possible or go to the  Emergency Department if any problems should occur.  Please show the CHEMOTHERAPY ALERT CARD or IMMUNOTHERAPY ALERT CARD at check-in to the Emergency Department and triage nurse.  Should you have questions after your visit or need to cancel or reschedule your appointment, please contact [facility] [phone]  and follow the prompts.  Office hours are 8:00 a.m. to 4:30 p.m. Monday - Friday. Please note that voicemails left after 4:00 p.m. may not be returned until the following business day.  We are closed weekends and major holidays. You have access to a nurse at all times for urgent questions. Please call the main number to the clinic [phone] and follow the prompts.  For any non-urgent questions, you may also contact your provider using MyChart. We now offer e-Visits for anyone 18 and older to request care online for non-urgent symptoms. For details visit [email or web address].   Also download the MyChart app! Go to the app store, search "MyChart", open the app, select [facility], and log in with your MyChart username and password.   

## 2022-08-11 ENCOUNTER — Other Ambulatory Visit: Payer: Self-pay | Admitting: *Deleted

## 2022-08-11 ENCOUNTER — Encounter: Payer: Self-pay | Admitting: Internal Medicine

## 2022-08-11 ENCOUNTER — Ambulatory Visit (INDEPENDENT_AMBULATORY_CARE_PROVIDER_SITE_OTHER): Payer: Medicare HMO | Admitting: Internal Medicine

## 2022-08-11 ENCOUNTER — Other Ambulatory Visit: Payer: Self-pay | Admitting: Internal Medicine

## 2022-08-11 ENCOUNTER — Inpatient Hospital Stay: Payer: Medicare HMO | Attending: Hematology

## 2022-08-11 ENCOUNTER — Other Ambulatory Visit: Payer: Self-pay | Admitting: Physician Assistant

## 2022-08-11 ENCOUNTER — Inpatient Hospital Stay: Payer: Medicare HMO

## 2022-08-11 VITALS — BP 150/80 | HR 81 | Ht 63.0 in | Wt 177.4 lb

## 2022-08-11 VITALS — BP 160/89 | HR 72 | Resp 16

## 2022-08-11 DIAGNOSIS — B9689 Other specified bacterial agents as the cause of diseases classified elsewhere: Secondary | ICD-10-CM

## 2022-08-11 DIAGNOSIS — Z1329 Encounter for screening for other suspected endocrine disorder: Secondary | ICD-10-CM | POA: Diagnosis not present

## 2022-08-11 DIAGNOSIS — N1831 Chronic kidney disease, stage 3a: Secondary | ICD-10-CM

## 2022-08-11 DIAGNOSIS — E039 Hypothyroidism, unspecified: Secondary | ICD-10-CM | POA: Diagnosis not present

## 2022-08-11 DIAGNOSIS — D45 Polycythemia vera: Secondary | ICD-10-CM

## 2022-08-11 DIAGNOSIS — Z131 Encounter for screening for diabetes mellitus: Secondary | ICD-10-CM | POA: Diagnosis not present

## 2022-08-11 DIAGNOSIS — J019 Acute sinusitis, unspecified: Secondary | ICD-10-CM | POA: Diagnosis not present

## 2022-08-11 DIAGNOSIS — I10 Essential (primary) hypertension: Secondary | ICD-10-CM | POA: Diagnosis not present

## 2022-08-11 DIAGNOSIS — N183 Chronic kidney disease, stage 3 unspecified: Secondary | ICD-10-CM | POA: Insufficient documentation

## 2022-08-11 MED ORDER — AZITHROMYCIN 250 MG PO TABS
ORAL_TABLET | ORAL | 0 refills | Status: AC
Start: 2022-08-11 — End: 2022-08-16

## 2022-08-11 MED ORDER — SPIRONOLACTONE 25 MG PO TABS
25.0000 mg | ORAL_TABLET | Freq: Every day | ORAL | 3 refills | Status: DC
Start: 2022-08-11 — End: 2023-08-26

## 2022-08-11 MED ORDER — MINOXIDIL 10 MG PO TABS
10.0000 mg | ORAL_TABLET | Freq: Every day | ORAL | 2 refills | Status: DC
Start: 2022-08-11 — End: 2022-12-24

## 2022-08-11 MED ORDER — CARVEDILOL 25 MG PO TABS
37.5000 mg | ORAL_TABLET | Freq: Two times a day (BID) | ORAL | 1 refills | Status: DC
Start: 2022-08-11 — End: 2022-12-24

## 2022-08-11 MED ORDER — CHLORTHALIDONE 25 MG PO TABS: 25.0000 mg | ORAL_TABLET | Freq: Every day | ORAL | 1 refills | Status: DC

## 2022-08-11 NOTE — Patient Instructions (Signed)
It was a pleasure to see you today.  Thank you for giving [place] the opportunity to be involved in your care.  Below is a brief recap of your visit and next steps.  We will plan to see you again in 6 weeks.  Summary No medication changes have been made today We will follow up in 6 weeks for hypertension Repeat labs have been ordered today

## 2022-08-11 NOTE — Assessment & Plan Note (Signed)
She is currently prescribed levothyroxine 125 mcg daily.  Repeat thyroid studies have been ordered today.

## 2022-08-11 NOTE — Patient Instructions (Signed)
[facility]  Discharge Instructions: Thank you for choosing [facility] to provide your oncology and hematology care.  If you have a lab appointment with the Cancer Center - please note that after April 8th, 2024, all labs will be drawn in the cancer center.  You do not have to check in or register with the main entrance as you have in the past but will complete your check-in in the cancer center.  Wear comfortable clothing and clothing appropriate for easy access to any Portacath or PICC line.   We strive to give you quality time with your provider. You may need to reschedule your appointment if you arrive late (15 or more minutes).  Arriving late affects you and other patients whose appointments are after yours.  Also, if you miss three or more appointments without notifying the office, you may be dismissed from the clinic at the provider's discretion.      For prescription refill requests, have your pharmacy contact our office and allow 72 hours for refills to be completed.    Today you had 1/2 phlebotomy done per orders.    To help prevent nausea and vomiting after your treatment, we encourage you to take your nausea medication as directed.  BELOW ARE SYMPTOMS THAT SHOULD BE REPORTED IMMEDIATELY: *FEVER GREATER THAN 100.4 F (38 C) OR HIGHER *CHILLS OR SWEATING *NAUSEA AND VOMITING THAT IS NOT CONTROLLED WITH YOUR NAUSEA MEDICATION *UNUSUAL SHORTNESS OF BREATH *UNUSUAL BRUISING OR BLEEDING *URINARY PROBLEMS (pain or burning when urinating, or frequent urination) *BOWEL PROBLEMS (unusual diarrhea, constipation, pain near the anus) TENDERNESS IN MOUTH AND THROAT WITH OR WITHOUT PRESENCE OF ULCERS (sore throat, sores in mouth, or a toothache) UNUSUAL RASH, SWELLING OR PAIN  UNUSUAL VAGINAL DISCHARGE OR ITCHING   Items with * indicate a potential emergency and should be followed up as soon as possible or go to the Emergency Department if any problems  should occur.  Please show the CHEMOTHERAPY ALERT CARD or IMMUNOTHERAPY ALERT CARD at check-in to the Emergency Department and triage nurse.  Should you have questions after your visit or need to cancel or reschedule your appointment, please contact [facility] [phone]  and follow the prompts.  Office hours are 8:00 a.m. to 4:30 p.m. Monday - Friday. Please note that voicemails left after 4:00 p.m. may not be returned until the following business day.  We are closed weekends and major holidays. You have access to a nurse at all times for urgent questions. Please call the main number to the clinic [phone] and follow the prompts.  For any non-urgent questions, you may also contact your provider using MyChart. We now offer e-Visits for anyone 88 and older to request care online for non-urgent symptoms. For details visit [email or web address].   Also download the MyChart app! Go to the app store, search "MyChart", open the app, select [facility], and log in with your MyChart username and password.

## 2022-08-11 NOTE — Assessment & Plan Note (Signed)
She endorses nasal and sinus congestion with green/yellow nasal secretions. -Z-Pak prescribed today

## 2022-08-11 NOTE — Assessment & Plan Note (Signed)
BP remains significantly elevated today.  Her currently prescribed antihypertensive regimen consists of carvedilol 37.5 mg twice daily, chlorthalidone 25 mg daily, minoxidil 10 mg daily and spironolactone 25 mg daily.  She cannot recall her exact medication regimen, but does not state that she is only taking carvedilol once daily.  She is also under a significant amount of stress today as otherwise documented. -No medication changes have been made today.  I have recommended that she take her antihypertensive medications as prescribed.  Carvedilol, spironolactone, minoxidil, and chlorthalidone have been refilled. -We will tentatively plan for follow-up in 6 weeks for HTN check

## 2022-08-11 NOTE — Telephone Encounter (Signed)
Hydrea refill approved.  Patient is tolerating and is to continue therapy. 
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
## 2022-08-11 NOTE — Progress Notes (Signed)
[name] [name] presents today for phlebotomy per MD orders. Phlebotomy procedure started at 1513 and ended at 1527. 250 mls removed. Patient did not want to wait her wait time after procedure without any incident. Patient tolerated procedure well. IV needle removed intact.  Treatment given per orders. Vitals stable and discharged home from clinic ambulatory. Follow up as scheduled.

## 2022-08-11 NOTE — Assessment & Plan Note (Signed)
Followed by nephrology (Dr. Wolfgang Phoenix).  Follow-up is scheduled for August.

## 2022-08-24 ENCOUNTER — Other Ambulatory Visit: Payer: Self-pay

## 2022-08-24 DIAGNOSIS — D45 Polycythemia vera: Secondary | ICD-10-CM

## 2022-08-25 ENCOUNTER — Inpatient Hospital Stay: Payer: Medicare HMO

## 2022-08-25 DIAGNOSIS — D45 Polycythemia vera: Secondary | ICD-10-CM | POA: Diagnosis not present

## 2022-08-25 LAB — CBC
HCT: 41.4 % (ref 36.0–46.0)
Hemoglobin: 12.6 g/dL (ref 12.0–15.0)
MCH: 26.7 pg (ref 26.0–34.0)
MCHC: 30.4 g/dL (ref 30.0–36.0)
MCV: 87.7 fL (ref 80.0–100.0)
Platelets: 277 10*3/uL (ref 150–400)
RBC: 4.72 MIL/uL (ref 3.87–5.11)
RDW: 15.4 % (ref 11.5–15.5)
WBC: 5.7 10*3/uL (ref 4.0–10.5)
nRBC: 0 % (ref 0.0–0.2)

## 2022-08-25 NOTE — Progress Notes (Signed)
Patient presents today for therapeutic phlebotomy per provider's order. Patient's hemoglobin is 12.6 and hct 41.4 today. Message sent to [facility] and she stated patient could skip today, but we will proceed with the upcoming phlebotomy appointment on July 31st. Patient made aware and verbalized.   Discharged from clinic ambulatory in stable condition. Alert and oriented x 3. F/U with [facility] as scheduled.

## 2022-09-08 ENCOUNTER — Inpatient Hospital Stay: Payer: Medicare HMO

## 2022-09-08 NOTE — Progress Notes (Signed)
Patient presents for phlebotomy and vital signs reviewed with Rojelio Brenner, PA with verbal order ok to do procedure.  Patient denied SOB, headaches, or any other complaints.  Unable to complete phlebotomy.  Provider aware and ok to reschedule for next week.  Patient discharged with no s/s of distress noted.

## 2022-09-20 ENCOUNTER — Inpatient Hospital Stay: Payer: Medicare HMO

## 2022-09-21 ENCOUNTER — Inpatient Hospital Stay: Payer: Medicare HMO | Attending: Hematology

## 2022-09-21 ENCOUNTER — Inpatient Hospital Stay: Payer: Medicare HMO

## 2022-09-21 ENCOUNTER — Inpatient Hospital Stay: Payer: Medicare HMO | Admitting: Physician Assistant

## 2022-09-21 VITALS — BP 156/78 | HR 74 | Temp 97.6°F | Resp 18

## 2022-09-21 DIAGNOSIS — D45 Polycythemia vera: Secondary | ICD-10-CM | POA: Insufficient documentation

## 2022-09-21 NOTE — Progress Notes (Signed)
[name] [name] presents today for theraputic phlebotomy per MD orders. Last hgb 12.6/hct 41.4 was 08/25/22. VSS prior to procedure. Pt reports eating before arrival. Procedure started at 1527. removed from patient Procedure ended at 1550. Gauze and coban applied to [facility], site clean and dry. VSS upon completion of procedure. Pt denies dizziness, lightheadedness, or feeling faint. Pt did not wait 30 minute post wait time. Discharged in satisfactory condition with follow up instructions.

## 2022-10-08 ENCOUNTER — Inpatient Hospital Stay: Payer: Medicare HMO

## 2022-10-08 ENCOUNTER — Encounter: Payer: Self-pay | Admitting: Hematology

## 2022-10-08 DIAGNOSIS — D45 Polycythemia vera: Secondary | ICD-10-CM

## 2022-10-08 DIAGNOSIS — Z1589 Genetic susceptibility to other disease: Secondary | ICD-10-CM

## 2022-10-08 LAB — COMPREHENSIVE METABOLIC PANEL
ALT: 21 U/L (ref 0–44)
AST: 26 U/L (ref 15–41)
Albumin: 3.7 g/dL (ref 3.5–5.0)
Alkaline Phosphatase: 57 U/L (ref 38–126)
Anion gap: 9 (ref 5–15)
BUN: 24 mg/dL — ABNORMAL HIGH (ref 8–23)
CO2: 25 mmol/L (ref 22–32)
Calcium: 8.6 mg/dL — ABNORMAL LOW (ref 8.9–10.3)
Chloride: 101 mmol/L (ref 98–111)
Creatinine, Ser: 1.23 mg/dL — ABNORMAL HIGH (ref 0.44–1.00)
GFR, Estimated: 46 mL/min — ABNORMAL LOW (ref >60)
Glucose, Bld: 92 mg/dL (ref 70–99)
Potassium: 3.7 mmol/L (ref 3.5–5.1)
Sodium: 135 mmol/L (ref 135–145)
Total Bilirubin: 0.7 mg/dL (ref 0.3–1.2)
Total Protein: 7 g/dL (ref 6.5–8.1)

## 2022-10-08 LAB — CBC WITH DIFFERENTIAL/PLATELET
Abs Immature Granulocytes: 0.01 10*3/uL (ref 0.00–0.07)
Basophils Absolute: 0 10*3/uL (ref 0.0–0.1)
Basophils Relative: 1 %
Eosinophils Absolute: 0.3 10*3/uL (ref 0.0–0.5)
Eosinophils Relative: 5 %
HCT: 39.7 % (ref 36.0–46.0)
Hemoglobin: 12.1 g/dL (ref 12.0–15.0)
Immature Granulocytes: 0 %
Lymphocytes Relative: 34 %
Lymphs Abs: 1.9 10*3/uL (ref 0.7–4.0)
MCH: 24.7 pg — ABNORMAL LOW (ref 26.0–34.0)
MCHC: 30.5 g/dL (ref 30.0–36.0)
MCV: 81 fL (ref 80.0–100.0)
Monocytes Absolute: 0.3 10*3/uL (ref 0.1–1.0)
Monocytes Relative: 5 %
Neutro Abs: 3.2 10*3/uL (ref 1.7–7.7)
Neutrophils Relative %: 55 %
Platelets: 239 10*3/uL (ref 150–400)
RBC: 4.9 MIL/uL (ref 3.87–5.11)
RDW: 17 % — ABNORMAL HIGH (ref 11.5–15.5)
WBC: 5.7 10*3/uL (ref 4.0–10.5)
nRBC: 0 % (ref 0.0–0.2)

## 2022-10-08 LAB — LACTATE DEHYDROGENASE: LDH: 202 U/L — ABNORMAL HIGH (ref 98–192)

## 2022-10-11 DIAGNOSIS — D45 Polycythemia vera: Secondary | ICD-10-CM | POA: Diagnosis not present

## 2022-10-11 DIAGNOSIS — I129 Hypertensive chronic kidney disease with stage 1 through stage 4 chronic kidney disease, or unspecified chronic kidney disease: Secondary | ICD-10-CM | POA: Diagnosis not present

## 2022-10-11 DIAGNOSIS — N1831 Chronic kidney disease, stage 3a: Secondary | ICD-10-CM | POA: Diagnosis not present

## 2022-10-11 DIAGNOSIS — I5032 Chronic diastolic (congestive) heart failure: Secondary | ICD-10-CM | POA: Diagnosis not present

## 2022-10-12 NOTE — Progress Notes (Addendum)
[name] [address], [place] [address]   CLINIC:  Medical Oncology/Hematology  PCP:  [name] Lade, MD [address] [place] [address] [phone]   REASON FOR VISIT:  Follow-up for JAK2 polycythemia vera  PRIOR THERAPY: None  CURRENT THERAPY: Hydrea 500 mg daily + aspirin 81 mg + phlebotomy  INTERVAL HISTORY:   [name] [name] 77 y.o. female returns for routine follow-up of her polycythemia vera.  She was last seen by [name] Brenner PA-C on 06/30/2022.  At today's visit, she reports feeling well.  She has had decreased need for phlebotomy over the past month, but when required has tolerated phlebotomy quite well.  She is prescribed Hydrea 500 mg daily, but reports that she has been taking this every 2 to 3 days rather than as prescribed.  She has declined to take aspirin after "doing her own research."  She continues to report some nail changes from [facility], but denies any skin sores, mouth sores, or fatigue.  She has mild intermittent nausea.  She reports good energy, and stays active doing cardio and weightlifting throughout the week.   Overall, she feels better after starting phlebotomy, and reports decreased headaches and improved tinnitus.  She has also been following closely with her PCP (Dr. Durwin Nora) for management of hypertension, which has significantly improved after new medications and phlebotomy.   She denies any aquagenic pruritus, erythromelalgia, or B symptoms.  She has 100% energy and 100% appetite. She endorses that she is maintaining a stable weight.  ASSESSMENT & PLAN:  1.   JAK2 positive polycythemia vera - Seen at request of Dr. Wolfgang Phoenix for elevated hemoglobin and platelet count (CBC from 11/20/2021 showed Hgb 16.9/hematocrit 51.1, platelets 613) - Review of EMR shows platelet count elevated since August 2015,  Hemoglobin elevated since January 2019 - No history of DVT, PE, MI, or CVA. - Hematology work-up (11/20/2021):   JAK2 V617F positive.  Low erythropoietin 1.8 - Hydrea started on 01/14/2022.  Unable to tolerate dosage >1000 mg daily due to side effects.  Current prescribed dose is Hydrea 500 mg daily, but patient reports that she is only taking it every 2 to 3 days. - Aspirin 81 mg recommended with extensive discussion regarding medical indication for antiplatelet agents.  Patient states that she "did her own research and knows that it could give her a heart attack," so she does not take it - She has required intermittent phlebotomy for control of hematocrit, which she is tolerating well. - Most recent labs (10/08/2022): Hgb 12.1/hematocrit 39.7%, platelets 239.  Normal WBC/differential.  Minimally elevated LDH 202.  Baseline CKD stage IIIa.  Normal LFTs. - She is feeling better (decreased headaches and tinnitus, improved blood pressure) after starting phlebotomy - Extensive discussion regarding diagnosis of polycythemia vera, including risk of thrombosis (CVA, MI, DVT, PE) as well as risk of myelofibrosis and leukemia in some patients. - Based on her age, she is considered high risk and therefore recommended cytoreductive therapy with hydroxyurea.  Due to side effects from increased dose of hydroxyurea, we will use combination of Hydrea and phlebotomy to achieve target blood counts. Patient was inquiring about treatment with PTG-300 ([place]), which is currently only available in clinical trials.  Discussed with patient that clinical trials are available in [place], and if she would like to pursue this we can place referral. - PLAN: Since patient has not been taking Hydrea as prescribed, we had discussion regarding importance of compliance.  Recommend taking Hydrea 500 mg MWF, with strict instructions to notify our office prior to making any dose changes of her own volition. - Recommend CBC + possible phlebotomy every 6 weeks (if HCT >45.0) - Recommended aspirin 81 mg daily (patient refuses) - Labs in 3  months = CBC/D, CMP, LDH  - OFFICE in 3 months (same-day labs + possible phlebotomy)    2.  Social/family history: - Lives at home with her family.  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  She was never smoker. - No family history of leukemias or malignancies.   PLAN SUMMARY: >> CBC + possible therapeutic phlebotomy every 6 weeks (if HCT 45.0) >> Labs in 3 months = CBC/D, CMP, LDH >> OFFICE visit in 3 months (same day as labs + possible phlebotomy)    REVIEW OF SYSTEMS:  Review of Systems  Constitutional:  Negative for appetite change, chills, diaphoresis, fatigue, fever and unexpected weight change.  HENT:   Negative for lump/mass, nosebleeds and tinnitus (resolved).   Eyes:  Negative for eye problems.  Respiratory:  Negative for cough, hemoptysis and shortness of breath.   Cardiovascular:  Negative for chest pain, leg swelling and palpitations.  Gastrointestinal:  Negative for abdominal pain, blood in stool, constipation, diarrhea, nausea and vomiting.  Genitourinary:  Negative for hematuria.   Musculoskeletal:  Negative for back pain.  Skin: Negative.   Neurological:  Positive for headaches (at times, associated with stress). Negative for dizziness and light-headedness.  Hematological:  Does not bruise/bleed easily.  Psychiatric/Behavioral:  Negative for sleep disturbance.      PHYSICAL EXAM:  ECOG PERFORMANCE STATUS: 1 - Symptomatic but completely ambulatory There were no vitals filed for this visit. There were no vitals filed for this visit. Physical Exam Constitutional:      Appearance: Normal appearance. She is obese.  Cardiovascular:     Heart sounds: Normal heart sounds.  Pulmonary:     Breath sounds: Normal breath sounds.  Neurological:     General: No focal deficit present.     Mental Status: Mental status is at baseline.  Psychiatric:        Behavior: Behavior normal. Behavior is  cooperative.    PAST MEDICAL/SURGICAL HISTORY:  Past Medical History:  Diagnosis Date   Anxiety    Dysphagia 09/03/2020   Essential hypertension    HLD (hyperlipidemia)    Hx of statin intolerance   Hypothyroidism    NSTEMI (non-ST elevated myocardial infarction) (HCC) 2015   a.  Related to occlusion of the distal Cx. The mid to distal OM1 also contains a significant stenosis that is best treated with medical therapy. Patent RCA/LAD with tortuosity. LM patent. EF 55%.    Polycythemia vera (HCC)    Stage 3b chronic kidney disease    Past Surgical History:  Procedure Laterality Date   ABDOMINAL HYSTERECTOMY     CARDIAC CATHETERIZATION  ~ 2000   CORONARY ANGIOPLASTY  09/20/2013   LEFT HEART CATHETERIZATION WITH CORONARY ANGIOGRAM N/A 09/20/2013   Procedure: LEFT HEART CATHETERIZATION WITH CORONARY ANGIOGRAM;  Surgeon: Lesleigh Noe, MD;  Location: [facility] CATH LAB;  Service: Cardiovascular;  Laterality: N/A;   LEFT HEART CATHETERIZATION WITH CORONARY ANGIOGRAM N/A 09/27/2013   Procedure: LEFT HEART CATHETERIZATION WITH CORONARY ANGIOGRAM;  Surgeon: Micheline Chapman, MD;  Location: [facility] CATH LAB;  Service: Cardiovascular;  Laterality: N/A;   THYROIDECTOMY  10/08/2011   Procedure: THYROIDECTOMY;  Surgeon: Redge Gainer  Lovell Sheehan, MD;  Location: [facility] ORS;  Service: General;  Laterality: N/A;  Total   VAGINAL HYSTERECTOMY  1993    SOCIAL HISTORY:  Social History   Socioeconomic History   Marital status: Widowed    Spouse name: Not on file   Number of children: Not on file   Years of education: Not on file   Highest education level: Not on file  Occupational History   Not on file  Tobacco Use   Smoking status: Never   Smokeless tobacco: Never  Vaping Use   Vaping status: Never Used  Substance and Sexual Activity   Alcohol use: No    Alcohol/week: 0.0 standard drinks of alcohol   Drug use: No   Sexual activity: Never  Other Topics Concern   Not on file  Social History Narrative   **  Merged History Encounter **  Lives at home with family.  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      Social Determinants of Health   Financial Resource Strain: Low Risk  (07/21/2020)   Overall Financial Resource Strain (CARDIA)    Difficulty of Paying Living Expenses: Not very hard  Food Insecurity: No Food Insecurity (07/21/2020)   Hunger Vital Sign    Worried About Running Out of Food in the Last Year: Never true    Ran Out of Food in the Last Year: Never true  Transportation Needs: No Transportation Needs (09/08/2020)   PRAPARE - Administrator, Civil Service (Medical): No    Lack of Transportation (Non-Medical): No  Physical Activity: Sufficiently Active (03/11/2020)   Exercise Vital Sign    Days of Exercise per Week: 5 days    Minutes of Exercise per Session: 30 min  Stress: No Stress Concern Present (03/11/2020)   [employer] of Occupational Health - Occupational Stress Questionnaire    Feeling of Stress : Not at all  Social Connections: Not on file  Intimate Partner Violence: Not on file    FAMILY HISTORY:  Family History  Problem Relation Age of Onset   Colon cancer Father    Thyroid disease Neg Hx     CURRENT MEDICATIONS:  Outpatient Encounter Medications as of 10/13/2022  Medication Sig   aspirin EC 81 MG tablet Take 1 tablet (81 mg total) by mouth daily. Swallow whole.   carvedilol (COREG) 25 MG tablet Take 1.5 tablets (37.5 mg total) by mouth 2 (two) times daily.   chlorthalidone (HYGROTON) 25 MG tablet Take 1 tablet (25 mg total) by mouth daily.   hydroxyurea (HYDREA) 500 MG capsule TAKE ONE CAPSULE BY MOUTH DAILY. MAY TAKE WITH FOOD TO minimize gi side efffects   ibuprofen (ADVIL) 600 MG tablet Take 1 tablet (600 mg total) by mouth every 6 (six) hours as needed.   levothyroxine (SYNTHROID) 125 MCG tablet Take 1 tablet (125 mcg total) by mouth daily before breakfast. 1 HOUR PRIOR TO OTHER MEDICATIONS   minoxidil (LONITEN) 10 MG tablet Take 1 tablet (10 mg total) by  mouth daily.   spironolactone (ALDACTONE) 25 MG tablet Take 1 tablet (25 mg total) by mouth daily.   No facility-administered encounter medications on file as of 10/13/2022.    ALLERGIES:  Allergies  Allergen Reactions   Fluviral [Influenza Virus Vaccine] Shortness Of Breath, Swelling and Other (See Comments)    Including back pain that radiates in upper extremeties   Haemophilus Influenzae Other (See Comments), Shortness Of Breath and Swelling    Including back pain that radiates in upper extremeties  Hydralazine Shortness Of Breath   Amlodipine     Lip swelling per patient.    Lisinopril     Dr. Felecia Shelling stopped due to side effects per patient.  Could remember what side effects were.     Ranexa [Ranolazine]     Chest pain    LABORATORY DATA:  I have reviewed the labs as listed.  CBC    Component Value Date/Time   WBC 5.7 10/08/2022 1310   RBC 4.90 10/08/2022 1310   HGB 12.1 10/08/2022 1310   HCT 39.7 10/08/2022 1310   PLT 239 10/08/2022 1310   MCV 81.0 10/08/2022 1310   MCH 24.7 (L) 10/08/2022 1310   MCHC 30.5 10/08/2022 1310   RDW 17.0 (H) 10/08/2022 1310   LYMPHSABS 1.9 10/08/2022 1310   MONOABS 0.3 10/08/2022 1310   EOSABS 0.3 10/08/2022 1310   BASOSABS 0.0 10/08/2022 1310      Latest Ref Rng & Units 10/08/2022    1:10 PM 06/23/2022    1:39 PM 04/30/2022    1:11 PM  CMP  Glucose 70 - 99 mg/dL 92  409  93   BUN 8 - 23 mg/dL 24  25  21    Creatinine 0.44 - 1.00 mg/dL 8.11  9.14  7.82   Sodium 135 - 145 mmol/L 135  136  136   Potassium 3.5 - 5.1 mmol/L 3.7  3.8  4.2   Chloride 98 - 111 mmol/L 101  101  104   CO2 22 - 32 mmol/L 25  26  24    Calcium 8.9 - 10.3 mg/dL 8.6  9.0  8.9   Total Protein 6.5 - 8.1 g/dL 7.0  7.6  7.5   Total Bilirubin 0.3 - 1.2 mg/dL 0.7  0.8  0.8   Alkaline Phos 38 - 126 U/L 57  58  53   AST 15 - 41 U/L 26  24  22    ALT 0 - 44 U/L 21  19  21      DIAGNOSTIC IMAGING:  I have independently reviewed the relevant imaging and discussed with  the patient.   WRAP UP:  All questions were answered. The patient knows to call the clinic with any problems, questions or concerns.  Medical decision making: Moderate  Time spent on visit: I spent 20 minutes counseling the patient face to face. The total time spent in the appointment was 30 minutes and more than 50% was on counseling.  Carnella Guadalajara, PA-C  10/13/22 3:38 PM

## 2022-10-13 ENCOUNTER — Inpatient Hospital Stay: Payer: Medicare HMO

## 2022-10-13 ENCOUNTER — Inpatient Hospital Stay: Payer: Medicare HMO | Attending: Hematology | Admitting: Physician Assistant

## 2022-10-13 ENCOUNTER — Encounter: Payer: Self-pay | Admitting: Physician Assistant

## 2022-10-13 VITALS — BP 137/75 | HR 68 | Temp 97.6°F | Resp 18 | Wt 184.2 lb

## 2022-10-13 DIAGNOSIS — R7402 Elevation of levels of lactic acid dehydrogenase (LDH): Secondary | ICD-10-CM | POA: Diagnosis not present

## 2022-10-13 DIAGNOSIS — N1832 Chronic kidney disease, stage 3b: Secondary | ICD-10-CM | POA: Diagnosis not present

## 2022-10-13 DIAGNOSIS — Z1589 Genetic susceptibility to other disease: Secondary | ICD-10-CM

## 2022-10-13 DIAGNOSIS — Z800 Family history of malignant neoplasm of digestive organs: Secondary | ICD-10-CM | POA: Diagnosis not present

## 2022-10-13 DIAGNOSIS — D45 Polycythemia vera: Secondary | ICD-10-CM | POA: Insufficient documentation

## 2022-10-13 NOTE — Progress Notes (Signed)
Message received from R. Pennington PA to cancel patient's phlebotomy this afternoon.

## 2022-10-13 NOTE — Patient Instructions (Signed)
[facility] at [facility] **VISIT SUMMARY & IMPORTANT INSTRUCTIONS **   You were seen today by Rojelio Brenner PA-C for your polycythemia vera.    POLYCYTHEMIA VERA Your blood counts look much better than at your previous appointments! Please take a consistent dose of your Hydrea - take this 1 tablet each day on Monday, Wednesday, and Friday.  Before making any changes to your dose yourself, please reach out to me so that we can recheck blood counts and make sure you are doing well. We will check your blood in 6 weeks to see if you need another phlebotomy.  FOLLOW-UP APPOINTMENT: Office visit in 3 months  ** Thank you for trusting me with your healthcare!  I strive to provide all of my patients with quality care at each visit.  If you receive a survey for this visit, I would be so grateful to you for taking the time to provide feedback.  Thank you in advance!  ~ Yudit Modesitt                   Dr. Doreatha Massed   &   Rojelio Brenner, PA-C   - - - - - - - - - - - - - - - - - -    Thank you for choosing [facility] at [facility] to provide your oncology and hematology care.  To afford each patient quality time with our provider, please arrive at least 15 minutes before your scheduled appointment time.   If you have a lab appointment with the Cancer Center please come in thru the Main Entrance and check in at the main information desk.  You need to re-schedule your appointment should you arrive 10 or more minutes late.  We strive to give you quality time with our providers, and arriving late affects you and other patients whose appointments are after yours.  Also, if you no show three or more times for appointments you may be dismissed from the clinic at the providers discretion.     Again, thank you for choosing [facility].  Our hope is that these requests will decrease the amount of time that you wait before being seen by our  physicians.       _____________________________________________________________  Should you have questions after your visit to [facility], please contact our office at [phone] and follow the prompts.  Our office hours are 8:00 a.m. and 4:30 p.m. Monday - Friday.  Please note that voicemails left after 4:00 p.m. may not be returned until the following business day.  We are closed weekends and major holidays.  You do have access to a nurse 24-7, just call the main number to the clinic [phone] and do not press any options, hold on the line and a nurse will answer the phone.    For prescription refill requests, have your pharmacy contact our office and allow 72 hours.

## 2022-10-14 DIAGNOSIS — H524 Presbyopia: Secondary | ICD-10-CM | POA: Diagnosis not present

## 2022-10-14 DIAGNOSIS — H35033 Hypertensive retinopathy, bilateral: Secondary | ICD-10-CM | POA: Diagnosis not present

## 2022-10-15 ENCOUNTER — Encounter: Payer: Self-pay | Admitting: Nurse Practitioner

## 2022-10-15 ENCOUNTER — Ambulatory Visit: Payer: Medicare HMO | Attending: Nurse Practitioner | Admitting: Nurse Practitioner

## 2022-10-15 VITALS — BP 160/78 | HR 89 | Ht 63.0 in | Wt 185.0 lb

## 2022-10-15 DIAGNOSIS — I10 Essential (primary) hypertension: Secondary | ICD-10-CM

## 2022-10-15 DIAGNOSIS — N1831 Chronic kidney disease, stage 3a: Secondary | ICD-10-CM | POA: Diagnosis not present

## 2022-10-15 DIAGNOSIS — E785 Hyperlipidemia, unspecified: Secondary | ICD-10-CM | POA: Diagnosis not present

## 2022-10-15 DIAGNOSIS — Z91199 Patient's noncompliance with other medical treatment and regimen due to unspecified reason: Secondary | ICD-10-CM

## 2022-10-15 DIAGNOSIS — E039 Hypothyroidism, unspecified: Secondary | ICD-10-CM | POA: Diagnosis not present

## 2022-10-15 DIAGNOSIS — R400 Somnolence: Secondary | ICD-10-CM | POA: Diagnosis not present

## 2022-10-15 DIAGNOSIS — Z789 Other specified health status: Secondary | ICD-10-CM

## 2022-10-15 DIAGNOSIS — I2510 Atherosclerotic heart disease of native coronary artery without angina pectoris: Secondary | ICD-10-CM

## 2022-10-15 DIAGNOSIS — I1A0 Resistant hypertension: Secondary | ICD-10-CM | POA: Diagnosis not present

## 2022-10-15 DIAGNOSIS — Z87898 Personal history of other specified conditions: Secondary | ICD-10-CM

## 2022-10-15 NOTE — Progress Notes (Unsigned)
Cardiology Office Note:    Date: 10/15/2022  ID:  [name], [name] February 10, 1945, MRN [id number] PCP:  Billie Lade, MD [facility] Providers Cardiologist:  Nona Dell, MD     Referring MD: Billie Lade, MD   CC: Here for follow-up  History of Present Illness:    [name] [name] is a very pleasant 77 y.o. female with a PMH of CAD, s/p NSTEMI, HTN, daytime fatigue/sleepiness, hypothyroidism, multinodular goiter, statin intolerance, polycythemia vera, HLD, and stage 3b CKD (sees Nephrology).   Last seen by Dr. Diona Browner on August 10, 2021 and was overall doing well from a cardiac perspective.    01/26/2022 - I initially saw her for follow-up. Was diagnosed with polycythemia vera, was following Dr. Ellin Saba. Was receiving PO chemo. Described a fullness along her upper neck/chest that was attributed to medication, denied any exertional chest pain or other associated symptoms.  Was having blood pressure medication issues and has been recommended to follow-up with cardiology.  STOP-BANG score 3. Echo revealed EF 55 to 60%, severe asymmetric left ventricle hypertrophy, grade 1 DD, trivial MR, no other significant valvular abnormalities.  Was referred to advanced hypertension clinic as she has several allergies to antihypertensives.  Was set up for split-night sleep study.  03/15/22 - Was doing well. Changed her diet - adhering to plant-based diet and performing regular exercise. Admitted to frequent headaches/migraines to give her issues.  Had not seen a neurologist in the past. SBP averaging 140s-150s. Denied any chest pain, shortness of breath, palpitations, syncope, presyncope, dizziness, orthopnea, PND, swelling or significant weight changes, acute bleeding, or claudication.  10/15/22 - Here for follow-up. BP elevated in office. Only taking Coreg 25 mg daily instead of what was previously prescribed (37.5 mg BID). Taking her thyroid pill only occasionally instead of daily. SBP  averages 130s - 140's, occasional 170's. Previously referred to advanced HTN clinic in [place], pt refuses. Discussed importance of statin medication and if patient would be interested due to her cardiac history and why this medication is important. Patient states she, "absolutely refuses statin medication or any cholesterol medication." Patient reports doing her own research and requests to do her natural remedies d/t her PCV. She shares with me that there is difficulty when it comes to trusting doctors and states, "they don't teach you in medical school." Denies any chest pain, shortness of breath, palpitations, syncope, presyncope, dizziness, orthopnea, PND, swelling or significant weight changes, acute bleeding, or claudication.  SH: In her free time, she enjoys going to yard sales.  She is retired from working in [employer], site.    Please see the history of present illness.    All other systems reviewed and are negative.  EKGs/Labs/Other Studies Reviewed:    The following studies were reviewed today:   EKG:   EKG Interpretation Date/Time:  Friday October 15 2022 12:01:14 EDT Ventricular Rate:  89 PR Interval:  158 QRS Duration:  78 QT Interval:  378 QTC Calculation: 459 R Axis:   76  Text Interpretation: Normal sinus rhythm Septal infarct , age undetermined T wave abnormality, consider inferior ischemia When compared with ECG of 07-Feb-2021 09:04, PREVIOUS ECG IS PRESENT Confirmed by Sharlene Dory [phone]) on 10/18/2022 12:10:10 PM   Echocardiogram on January 29, 2022:  1. Left ventricular ejection fraction, by estimation, is 55 to 60%. The  left ventricle has normal function. The left ventricle has no regional  wall motion abnormalities. 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 Left ventricular  diastolic   parameters are consistent with Grade I diastolic dysfunction (impaired  relaxation).   2. Right ventricular systolic function is normal. The right  ventricular  size is normal. There is normal pulmonary artery systolic pressure. The  estimated right ventricular systolic pressure is 23.6 mmHg.   3. Left atrial size was mildly dilated.   4. The mitral valve is normal in structure. Trivial mitral valve  regurgitation. No evidence of mitral stenosis.   5. The aortic valve is tricuspid. Aortic valve regurgitation is not  visualized. No aortic stenosis is present.   6. The inferior vena cava is normal in size with greater than 50%  respiratory variability, suggesting right atrial pressure of 3 mmHg.   Comparison(s): No prior Echocardiogram.  Bilateral renal artery ultrasound on April 24, 2020: Right: Normal size right kidney. Normal cortical thickness of right         kidney. Normal right Resisitive Index. No evidence of right         renal artery stenosis. RRV flow present.  Left:  Normal size of left kidney. Normal left Resistive Index.         Normal cortical thickness of the left kidney. No evidence of         left renal artery stenosis. LRV flow present.  Mesenteric:  Normal Celiac artery and Superior Mesenteric artery findings.  IVC is patent.  Myoview on September 24, 2015: Blood pressure demonstrated a hypertensive response to exercise. There was no ST segment deviation noted during stress. The study is normal. This is a low risk study. The left ventricular ejection fraction is normal (55-65%). Duke treadmill score of 7 consistent with low risk for major cardiac events.    2D echocardiogram on September 21, 2013: Left ventricle: The cavity size was normal. There was mild    concentric hypertrophy. Systolic function was normal. The    estimated ejection fraction was in the range of 55% to 60%. Wall    motion was normal; there were no regional wall motion    abnormalities. Doppler parameters are consistent with abnormal    left ventricular relaxation (grade 1 diastolic dysfunction).  - Left atrium: The atrium was mildly  dilated.  - Pulmonary arteries: Systolic pressure was mildly increased. PA    peak pressure: 37 mm Hg (S).  - Pericardium, extracardiac: A small pericardial effusion was    identified along the right ventricular free wall.   Transthoracic echocardiography.  M-mode, complete 2D, spectral  Doppler, and color Doppler.  Birthdate:  Patient birthdate:  [birth year].  Age:  Patient is 77 yr old.  Sex:  Gender: female.  BMI: 37 kg/m^2.  Blood pressure:     109/55  Patient status:  Inpatient.  Study date:  Study date: 09/21/2013. Study time: 09:24  AM.  Location:  Echo laboratory.   Physical Exam:    VS:  BP (!) 160/78   Pulse 89   Ht 5\' 3"  (1.6 m)   Wt 185 lb (83.9 kg)   SpO2 97%   BMI 32.77 kg/m     Wt Readings from Last 3 Encounters:  10/15/22 185 lb (83.9 kg)  10/13/22 184 lb 3.2 oz (83.6 kg)  08/11/22 177 lb 6.4 oz (80.5 kg)     GEN: Obese, 77 y.o. female in no acute distress HEENT: Normal NECK: No JVD; No carotid bruits CARDIAC: S1/S2, RRR, no murmurs, rubs, gallops; 2+ peripheral pulses throughout, strong equal bilaterally RESPIRATORY:  Clear to auscultation without rales, wheezing or rhonchi  MUSCULOSKELETAL:  No edema; No deformity  SKIN: Warm and dry NEUROLOGIC:  Alert and oriented x 3 PSYCHIATRIC:  Cooperative, appears agitated/verbally aggressive at times   ASSESSMENT & PLAN:    In order of problems listed above:  CAD, s/p NSTEMI Stable with no anginal symptoms. No indication for ischemic evaluation. Normal Lexiscan in 2017. Heart healthy diet and regular cardiovascular exercise encouraged. ED precautions discussed.  Continue current medication regimen. Patient states that she "absolutely refuses statin medication or any cholesterol medication." Instructed patient about importance of medication and why it is important for her health. Patient reports doing her own research and requests to do her natural remedies d/t her PCV. She refuses to go to advanced HTN clinic  - see  below. Will consult clinical pharm D for further recs.   HTN, resistant hypertension SBP averaging 130's-140's with occasional 160's. Not taking her Coreg as I previously prescribed. BP elevated in office. Refuses to go to advanced HTN clinic. Will consult clinical pharm D as goal SBP < 140. Discussed to monitor BP at home at least 2 hours after medications and sitting for 5-10 minutes. Low salt, heart healthy diet recommended.  - Unable to tolerate hydralazine, lisinopril, and amlodipine.  - No evidence of renal artery stenosis.  - Do not see where sleep study was performed.   3. Daytime sleepiness, hx of snoring STOP-Bang score 3 . Says she has not had sleep study performed before. Do not see where this was performed. Will revisit at next office visit. Heart healthy diet and regular cardiovascular exercise encouraged.   Hypothyroidism Taking thyroid medication occasionally instead of daily. Continue Synthroid daily as prescribed by PCP.  Continue to follow with PCP who is managing this medication. Heart healthy diet and regular cardiovascular exercise encouraged.   HLD, statin intolerance Past labs revealed total cholesterol 172, HDL 51, LDL 106, triglycerides 78.  She has a history of statin intolerance.  She has requested to work on diet and exercise before starting on medication. I revisited and had in-depth discussion with patient about medication and importance of medication. Patient states that she "absolutely refuses statin medication or any cholesterol medication." Instructed patient about importance of medication and why it is important for her health. Patient reports doing her own research and requests to do her natural remedies d/t her PCV. She also refuses to go to [place] to go to the [facility]. Heart healthy diet encouraged.   Stage IIIa chronic kidney disease (sees kidney doctor) Most recent serum creatinine stable at 1.23 with eGFR at 46.  She currently follows a kidney  doctor.  Avoid nephrotoxic agents.  Encouraged to stay adequately well-hydrated.  6. Noncompliance by refusing intervention or support Patient has a history of refusing referrals, medications, and interventions as suggested or arranged. Discussed importance of these interventions, medications, and suggestions to help her health and overall well being. As stated above, she, "absolutely refuses statin medication or any cholesterol medication." Patient reports doing her own research and requests to do her natural remedies d/t her PCV. She shares with me that there is difficulty when it comes to trusting doctors and states, "they don't teach you in medical school." Performed empathetic listening and I had an in-depth discussion with her about plan of care. Offered support and provided empathy. She is agreeable to consult with clinical pharm D - will consult as mentioned above.  6. Disposition: Follow up with Dr. Diona Browner or APP in 6 months or sooner if anything changes.  Medication Adjustments/Labs and Tests Ordered: Current medicines are reviewed at length with the patient today.  Concerns regarding medicines are outlined above.  Orders Placed This Encounter  Procedures   EKG 12-Lead   No orders of the defined types were placed in this encounter.  Medication Instructions:  Your physician recommends that you continue on your current medications as directed. Please refer to the Current Medication list given to you today.  Labwork: None  Testing/Procedures: None  Follow-Up: Your physician recommends that you schedule a follow-up appointment in: 6 Months   Any Other Special Instructions Will Be Listed Below (If Applicable).  If you need a refill on your cardiac medications before your next appointment, please call your pharmacy.  Signed, Sharlene Dory, NP

## 2022-10-15 NOTE — Patient Instructions (Addendum)

## 2022-10-18 NOTE — Progress Notes (Signed)
She's not taking the minoxidil with any regularity and doesn't take the right dose of carvedilol.  Her issue may be as much with compliance as with resistant hypertension.  I would suggest increasing the spironolactone to 50 mg daily.  Her potassium level was low (compliance?) so we have room to move that dose up.  Just be sure to get a BMET in 10-14 days, her SCr is slightly elevated, but still safe.

## 2022-11-04 ENCOUNTER — Encounter: Payer: Self-pay | Admitting: Hematology

## 2022-11-04 NOTE — Progress Notes (Signed)
Entered orders for labs.

## 2022-11-15 DIAGNOSIS — H01004 Unspecified blepharitis left upper eyelid: Secondary | ICD-10-CM | POA: Diagnosis not present

## 2022-11-15 DIAGNOSIS — H2511 Age-related nuclear cataract, right eye: Secondary | ICD-10-CM | POA: Diagnosis not present

## 2022-11-15 DIAGNOSIS — H01002 Unspecified blepharitis right lower eyelid: Secondary | ICD-10-CM | POA: Diagnosis not present

## 2022-11-15 DIAGNOSIS — H01001 Unspecified blepharitis right upper eyelid: Secondary | ICD-10-CM | POA: Diagnosis not present

## 2022-11-16 ENCOUNTER — Encounter: Payer: Self-pay | Admitting: Cardiology

## 2022-11-16 ENCOUNTER — Telehealth: Payer: Self-pay | Admitting: Nurse Practitioner

## 2022-11-16 MED ORDER — BLOOD PRESSURE MONITORING KIT: 1.0000 | PACK | 0 refills | Status: AC

## 2022-11-16 NOTE — Telephone Encounter (Signed)
Patient called to confirm if prescription has been sent to [facility] for her BP cuff.

## 2022-11-16 NOTE — Telephone Encounter (Signed)
Spoke with patient she states she has been unable to check her BP readings recently due to not being able to find her charges for it patient request we send in a Rx for a BP cuff.  Patient has agreed to come in for BP check.  She states her blood pressure runs normal unless she has a craving and eats something she knows she is not supposed to that is high in sodium.  Note sent to schedule patient for 2 week nurse visit for BP check

## 2022-11-16 NOTE — Addendum Note (Signed)
Addended by: Eustace Moore on: 11/16/2022 03:03 PM   Modules accepted: Orders

## 2022-11-16 NOTE — Telephone Encounter (Signed)
Patient aware that BP monitor rx was sent to [facility].

## 2022-11-16 NOTE — Telephone Encounter (Signed)
-----   Message from Sharlene Dory sent at 11/14/2022 10:02 PM EDT ----- How are patient's BP at home doing? Would patient be willing to come into office for BP check for nurse visit/CMA visit? If so, please arrange in 2 weeks. Want to see if I need to adjust her medication some more.   Thanks!   Best,  Sharlene Dory, NP

## 2022-11-24 ENCOUNTER — Inpatient Hospital Stay: Payer: Medicare HMO | Attending: Hematology

## 2022-11-24 ENCOUNTER — Other Ambulatory Visit: Payer: Self-pay | Admitting: *Deleted

## 2022-11-24 ENCOUNTER — Inpatient Hospital Stay: Payer: Medicare HMO

## 2022-11-24 DIAGNOSIS — D45 Polycythemia vera: Secondary | ICD-10-CM | POA: Insufficient documentation

## 2022-11-24 LAB — CBC
HCT: 44.2 % (ref 36.0–46.0)
Hemoglobin: 12.8 g/dL (ref 12.0–15.0)
MCH: 22.9 pg — ABNORMAL LOW (ref 26.0–34.0)
MCHC: 29 g/dL — ABNORMAL LOW (ref 30.0–36.0)
MCV: 78.9 fL — ABNORMAL LOW (ref 80.0–100.0)
Platelets: 384 10*3/uL (ref 150–400)
RBC: 5.6 MIL/uL — ABNORMAL HIGH (ref 3.87–5.11)
RDW: 19 % — ABNORMAL HIGH (ref 11.5–15.5)
WBC: 5.5 10*3/uL (ref 4.0–10.5)
nRBC: 0 % (ref 0.0–0.2)

## 2022-11-24 NOTE — Progress Notes (Signed)
HCT 44.2. No phlebotomy needed today per standing orders. No complaints at this time. Discharged from clinic ambulatory in stable condition. Alert and oriented x 3. F/U with [facility] as scheduled.

## 2022-12-01 ENCOUNTER — Ambulatory Visit: Payer: Medicare HMO

## 2022-12-02 ENCOUNTER — Inpatient Hospital Stay: Payer: Medicare HMO | Admitting: Licensed Clinical Social Worker

## 2022-12-02 DIAGNOSIS — D45 Polycythemia vera: Secondary | ICD-10-CM

## 2022-12-02 NOTE — Progress Notes (Signed)
[facility] CSW Progress Note  Visual merchandiser met with patient briefly to discuss [employer].  Pt states she already has advance directives completed at home and was hoping to be able to complete a financial power of attorney.  CSW explained this service can not be completed at the hospital.     Rachel Moulds, LCSW Clinical Social Worker [facility]

## 2022-12-13 DIAGNOSIS — H25811 Combined forms of age-related cataract, right eye: Secondary | ICD-10-CM | POA: Diagnosis not present

## 2022-12-20 NOTE — H&P (Signed)
Surgical History & Physical  Patient Name: [name] [name]  DOB: [birth year]  Surgery: Cataract extraction with intraocular lens implant phacoemulsification; Right Eye Surgeon: Fabio Pierce MD Surgery Date: 12/24/2022 Pre-Op Date: 11/15/2022  HPI: A 33 Yr. old female patient present for cataract eval OD per Dr. Earlene Plater. 1. The patient complains of difficulty when driving at night due to glare and halos, which began for an unknown amount of time. The right eye is affected. The episode is constant. The condition's severity is worsening. This is negatively affecting the patient's quality of life and the patient is unable to function adequately in life with the current level of vision. HPI was performed by Fabio Pierce .  Medical History: Dry Eyes Cataracts HTN Retinopathy OU  Cancer Heart Problem High Blood Pressure  Review of Systems Thyroid Problems Cardiovascular High Blood Pressure Hemotologic/Lymphatic blood cancer All recorded systems are negative except as noted above.  Social Never smoked   Medication hydroxyurea ,  chlorthalidone ,  spironolactone ,  prednisolone acetate ,  Ilevro ,  moxifloxacin   Sx/Procedures Phaco c IOL OS,  C Section, Fibroid tumors removed  Drug Allergies  metoprolol ,  lisinopril   History & Physical: Heent: cataract NECK: supple without bruits LUNGS: lungs clear to auscultation CV: regular rate and rhythm Abdomen: soft and non-tender  Impression & Plan: Assessment: 1.  NUCLEAR SCLEROSIS AGE RELATED; Right Eye (H25.11) 2.  BLEPHARITIS; Right Upper Lid, Right Lower Lid, Left Upper Lid, Left Lower Lid (H01.001, H01.002,H01.004,H01.005) 3.  DERMATOCHALASIS, no surgery; Right Upper Lid, Left Upper Lid (H02.831, H02.834) 4.  CATARACT EXTRACTION STATUS; Left Eye (Z98.42) 5.  INTRAOCULAR LENS IOL ; Left Eye (Z96.1)  Plan: 1.  Cataract accounts for the patient's decreased vision. This visual impairment is not correctable with a tolerable  change in glasses or contact lenses. Cataract surgery with an implantation of a new lens should significantly improve the visual and functional status of the patient. Discussed all risks, benefits, alternatives, and potential complications. Discussed the procedures and recovery. Patient desires to have surgery. A-scan ordered and performed today for intra-ocular lens calculations. The surgery will be performed in order to improve vision for driving, reading, and for eye examinations. Recommend phacoemulsification with intra-ocular lens. Recommend Dextenza for post-operative pain and inflammation. Right Eye only. Surgery required to correct imbalance of vision. Dilates well - shugarcaine by protocol.  2.  Blepharitis is present - recommend regular lid cleaning.  3.  Asymptomatic, recommend observation for now. Findings, prognosis and treatment options reviewed.  4.  Continue post op care  5.  Stable. Doing well since surgery Patient does not have IOL card and does not remember name of surgeon. Will call Dr. Earlene Plater office to try to get IOL information.

## 2022-12-21 ENCOUNTER — Other Ambulatory Visit: Payer: Self-pay | Admitting: Hematology

## 2022-12-21 ENCOUNTER — Encounter (HOSPITAL_COMMUNITY)
Admission: RE | Admit: 2022-12-21 | Discharge: 2022-12-21 | Disposition: A | Discharge status: Home / Self Care | Payer: Medicare HMO | Source: Ambulatory Visit | Attending: Ophthalmology | Admitting: Ophthalmology

## 2022-12-21 DIAGNOSIS — D45 Polycythemia vera: Secondary | ICD-10-CM

## 2022-12-21 NOTE — Pre-Procedure Instructions (Signed)
Attempted pre-op phonecall. Left VM for her to call us back. 

## 2022-12-22 ENCOUNTER — Encounter: Payer: Self-pay | Admitting: Hematology

## 2022-12-22 ENCOUNTER — Other Ambulatory Visit: Payer: Self-pay

## 2022-12-24 ENCOUNTER — Ambulatory Visit (HOSPITAL_COMMUNITY): Payer: Medicare HMO | Admitting: Anesthesiology

## 2022-12-24 ENCOUNTER — Ambulatory Visit (HOSPITAL_COMMUNITY)
Admission: RE | Admit: 2022-12-24 | Discharge: 2022-12-24 | Disposition: A | Discharge status: Home / Self Care | Payer: Medicare HMO | Attending: Ophthalmology | Admitting: Ophthalmology

## 2022-12-24 ENCOUNTER — Encounter (HOSPITAL_COMMUNITY)
Admission: RE | Disposition: A | Discharge status: Home / Self Care | Payer: Self-pay | Source: Home / Self Care | Attending: Ophthalmology

## 2022-12-24 ENCOUNTER — Encounter (HOSPITAL_COMMUNITY): Payer: Self-pay | Admitting: Ophthalmology

## 2022-12-24 DIAGNOSIS — H02834 Dermatochalasis of left upper eyelid: Secondary | ICD-10-CM | POA: Diagnosis not present

## 2022-12-24 DIAGNOSIS — D45 Polycythemia vera: Secondary | ICD-10-CM | POA: Insufficient documentation

## 2022-12-24 DIAGNOSIS — G4730 Sleep apnea, unspecified: Secondary | ICD-10-CM | POA: Insufficient documentation

## 2022-12-24 DIAGNOSIS — Z9842 Cataract extraction status, left eye: Secondary | ICD-10-CM | POA: Insufficient documentation

## 2022-12-24 DIAGNOSIS — E039 Hypothyroidism, unspecified: Secondary | ICD-10-CM | POA: Diagnosis not present

## 2022-12-24 DIAGNOSIS — Z79899 Other long term (current) drug therapy: Secondary | ICD-10-CM | POA: Diagnosis not present

## 2022-12-24 DIAGNOSIS — I2510 Atherosclerotic heart disease of native coronary artery without angina pectoris: Secondary | ICD-10-CM | POA: Insufficient documentation

## 2022-12-24 DIAGNOSIS — H2511 Age-related nuclear cataract, right eye: Secondary | ICD-10-CM | POA: Diagnosis not present

## 2022-12-24 DIAGNOSIS — H02831 Dermatochalasis of right upper eyelid: Secondary | ICD-10-CM | POA: Diagnosis not present

## 2022-12-24 DIAGNOSIS — Z961 Presence of intraocular lens: Secondary | ICD-10-CM | POA: Insufficient documentation

## 2022-12-24 DIAGNOSIS — H25811 Combined forms of age-related cataract, right eye: Secondary | ICD-10-CM | POA: Diagnosis not present

## 2022-12-24 DIAGNOSIS — I129 Hypertensive chronic kidney disease with stage 1 through stage 4 chronic kidney disease, or unspecified chronic kidney disease: Secondary | ICD-10-CM | POA: Diagnosis not present

## 2022-12-24 DIAGNOSIS — H0100A Unspecified blepharitis right eye, upper and lower eyelids: Secondary | ICD-10-CM | POA: Insufficient documentation

## 2022-12-24 DIAGNOSIS — N1832 Chronic kidney disease, stage 3b: Secondary | ICD-10-CM | POA: Diagnosis not present

## 2022-12-24 DIAGNOSIS — H0100B Unspecified blepharitis left eye, upper and lower eyelids: Secondary | ICD-10-CM | POA: Insufficient documentation

## 2022-12-24 HISTORY — PX: CATARACT EXTRACTION W/PHACO: SHX586

## 2022-12-24 SURGERY — PHACOEMULSIFICATION, CATARACT, WITH IOL INSERTION
Anesthesia: Monitor Anesthesia Care | Site: Eye | Laterality: Right

## 2022-12-24 MED ORDER — MOXIFLOXACIN HCL 5 MG/ML IO SOLN
INTRAOCULAR | Status: DC | PRN
  Administered 2022-12-24: .3 mL via OPHTHALMIC

## 2022-12-24 MED ORDER — EPINEPHRINE PF 1 MG/ML IJ SOLN
INTRAOCULAR | Status: DC | PRN
  Administered 2022-12-24: 500 mL

## 2022-12-24 MED ORDER — LIDOCAINE HCL 3.5 % OP GEL
1.0000 | Freq: Once | OPHTHALMIC | Status: AC
  Administered 2022-12-24: 1 via OPHTHALMIC

## 2022-12-24 MED ORDER — PHENYLEPHRINE HCL 2.5 % OP SOLN
1.0000 [drp] | OPHTHALMIC | Status: AC | PRN
Start: 2022-12-24 — End: 2022-12-24
  Administered 2022-12-24 (×3): 1 [drp] via OPHTHALMIC

## 2022-12-24 MED ORDER — STERILE WATER FOR IRRIGATION IR SOLN
Status: DC | PRN
  Administered 2022-12-24: 250 mL

## 2022-12-24 MED ORDER — TROPICAMIDE 1 % OP SOLN
1.0000 [drp] | OPHTHALMIC | Status: AC | PRN
  Administered 2022-12-24 (×3): 1 [drp] via OPHTHALMIC

## 2022-12-24 MED ORDER — BSS IO SOLN
INTRAOCULAR | Status: DC | PRN
  Administered 2022-12-24: 15 mL via INTRAOCULAR

## 2022-12-24 MED ORDER — LIDOCAINE HCL (PF) 1 % IJ SOLN
INTRAOCULAR | Status: DC | PRN
  Administered 2022-12-24: 1 mL via OPHTHALMIC

## 2022-12-24 MED ORDER — SODIUM CHLORIDE 0.9% FLUSH
INTRAVENOUS | Status: DC | PRN
  Administered 2022-12-24: 5 mL via INTRAVENOUS

## 2022-12-24 MED ORDER — MIDAZOLAM HCL 2 MG/2ML IJ SOLN
INTRAMUSCULAR | Status: AC
  Filled 2022-12-24: qty 2

## 2022-12-24 MED ORDER — MIDAZOLAM HCL 5 MG/5ML IJ SOLN
INTRAMUSCULAR | Status: DC | PRN
  Administered 2022-12-24: 2 mg via INTRAVENOUS

## 2022-12-24 MED ORDER — POVIDONE-IODINE 5 % OP SOLN
OPHTHALMIC | Status: DC | PRN
  Administered 2022-12-24: 1 via OPHTHALMIC

## 2022-12-24 MED ORDER — TETRACAINE HCL 0.5 % OP SOLN
1.0000 [drp] | OPHTHALMIC | Status: AC | PRN
  Administered 2022-12-24 (×3): 1 [drp] via OPHTHALMIC

## 2022-12-24 MED ORDER — SODIUM HYALURONATE 23MG/ML IO SOSY
PREFILLED_SYRINGE | INTRAOCULAR | Status: DC | PRN
  Administered 2022-12-24: .6 mL via INTRAOCULAR

## 2022-12-24 MED ORDER — SODIUM HYALURONATE 10 MG/ML IO SOLUTION
PREFILLED_SYRINGE | INTRAOCULAR | Status: DC | PRN
  Administered 2022-12-24: .85 mL via INTRAOCULAR

## 2022-12-24 SURGICAL SUPPLY — 14 items
CATARACT SUITE SIGHTPATH (MISCELLANEOUS) ×1
CLOTH BEACON ORANGE TIMEOUT ST (SAFETY) ×2 IMPLANT
EYE SHIELD UNIVERSAL CLEAR (GAUZE/BANDAGES/DRESSINGS) IMPLANT
FEE CATARACT SUITE SIGHTPATH (MISCELLANEOUS) ×2 IMPLANT
GLOVE BIOGEL PI IND STRL 7.0 (GLOVE) ×4 IMPLANT
LENS IOL TECNIS EYHANCE 23.0 (Intraocular Lens) IMPLANT
NDL HYPO 18GX1.5 BLUNT FILL (NEEDLE) ×2 IMPLANT
NEEDLE HYPO 18GX1.5 BLUNT FILL (NEEDLE) ×1
PAD ARMBOARD 7.5X6 YLW CONV (MISCELLANEOUS) ×2 IMPLANT
POSITIONER HEAD 8X9X4 ADT (SOFTGOODS) ×2 IMPLANT
RING MALYGIN 7.0 (MISCELLANEOUS) IMPLANT
SYR TB 1ML LL NO SAFETY (SYRINGE) ×2 IMPLANT
TAPE SURG TRANSPORE 1 IN (GAUZE/BANDAGES/DRESSINGS) IMPLANT
WATER STERILE IRR 250ML POUR (IV SOLUTION) ×2 IMPLANT

## 2022-12-24 NOTE — Op Note (Signed)
Date of procedure: 12/24/22  Pre-operative diagnosis:  Visually significant combined form age-related cataract, Right Eye (H25.811)  Post-operative diagnosis:  Visually significant combined form age-related cataract, Right Eye (H25.811)  Procedure: Removal of cataract via phacoemulsification and insertion of intra-ocular lens [employer] DIB00 +23.0D into the capsular bag of the Right Eye  Attending surgeon: Rudy Jew. Juventino Pavone, MD, MA  Anesthesia: MAC, Topical Akten  Complications: None  Estimated Blood Loss: <35mL (minimal)  Specimens: None  Implants: As above  Indications:  Visually significant age-related cataract, Right Eye  Procedure:  The patient was seen and identified in the pre-operative area. The operative eye was identified and dilated.  The operative eye was marked.  Topical anesthesia was administered to the operative eye.     The patient was then to the operative suite and placed in the supine position.  A timeout was performed confirming the patient, procedure to be performed, and all other relevant information.   The patient's face was prepped and draped in the usual fashion for intra-ocular surgery.  A lid speculum was placed into the operative eye and the surgical microscope moved into place and focused.  A superotemporal paracentesis was created using a 20 gauge paracentesis blade.  Shugarcaine was injected into the anterior chamber.  Viscoelastic was injected into the anterior chamber.  A temporal clear-corneal main wound incision was created using a 2.35mm microkeratome.  A continuous curvilinear capsulorrhexis was initiated using an irrigating cystitome and completed using capsulorrhexis forceps.  Hydrodissection and hydrodeliniation were performed.  Viscoelastic was injected into the anterior chamber.  A phacoemulsification handpiece and a chopper as a second instrument were used to remove the nucleus and epinucleus. The irrigation/aspiration handpiece was used to  remove any remaining cortical material.   The capsular bag was reinflated with viscoelastic, checked, and found to be intact.  The intraocular lens was inserted into the capsular bag.  The irrigation/aspiration handpiece was used to remove any remaining viscoelastic.  The clear corneal wound and paracentesis wounds were then hydrated and checked with Weck-Cels to be watertight. 0.56mL of Moxfloxacin was injected into the anterior chamber. The lid-speculum was removed.  The drape was removed.  The patient's face was cleaned with a wet and dry 4x4. A clear shield was taped over the eye. The patient was taken to the post-operative care unit in good condition, having tolerated the procedure well.  Post-Op Instructions: The patient will follow up at [facility] for a same day post-operative evaluation and will receive all other orders and instructions.

## 2022-12-24 NOTE — Anesthesia Postprocedure Evaluation (Signed)
Anesthesia Post Note  Patient: [name] [name]  Procedure(s) Performed: CATARACT EXTRACTION PHACO AND INTRAOCULAR LENS PLACEMENT (IOC) (Right: Eye)  Patient location during evaluation: Short Stay Anesthesia Type: MAC Level of consciousness: awake and alert Pain management: pain level controlled Vital Signs Assessment: post-procedure vital signs reviewed and stable Respiratory status: spontaneous breathing Cardiovascular status: blood pressure returned to baseline and stable Postop Assessment: no apparent nausea or vomiting Anesthetic complications: no   No notable events documented.   Last Vitals:  Vitals:   12/24/22 0918 12/24/22 0953  BP:  (!) 142/73  Pulse: 65 66  Resp: 12 18  Temp:  36.7 C  SpO2: 100% 99%    Last Pain:  Vitals:   12/24/22 0953  TempSrc: Oral  PainSc: 0-No pain                 Getsemani Lindon

## 2022-12-24 NOTE — Anesthesia Preprocedure Evaluation (Signed)
Anesthesia Evaluation  Patient identified by MRN, date of birth, ID band Patient awake    Reviewed: Allergy & Precautions, H&P , NPO status , Patient's Chart, lab work & pertinent test results  History of Anesthesia Complications Negative for: history of anesthetic complications  Airway Mallampati: II  TM Distance: >3 FB Neck ROM: Full    Dental   Pulmonary sleep apnea (pos screen, no prior hx)    Pulmonary exam normal breath sounds clear to auscultation       Cardiovascular hypertension, Pt. 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 Cath neg c.2007)  Normal cardiovascular exam Rhythm:Regular Rate:Normal     Neuro/Psych   Anxiety      Neuromuscular disease    GI/Hepatic   Endo/Other  Hypothyroidism Hyperthyroidism (multinodular goiter, no tracheal deviation noted.)   Renal/GU Renal diseaseStage 3b CKD     Musculoskeletal   Abdominal   Peds  Hematology   Anesthesia Other Findings Polycythemia vera  Reproductive/Obstetrics                             Anesthesia Physical Anesthesia Plan  ASA: 3  Anesthesia Plan: MAC   Post-op Pain Management:    Induction: Intravenous  PONV Risk Score and Plan:   Airway Management Planned: Nasal Cannula and Natural Airway  Additional Equipment: None  Intra-op Plan:   Post-operative Plan:   Informed Consent: I have reviewed the patients History and Physical, chart, labs and discussed the procedure including the risks, benefits and alternatives for the proposed anesthesia with the patient or authorized representative who has indicated his/her understanding and acceptance.     Dental advisory given  Plan Discussed with: CRNA  Anesthesia Plan Comments:         Anesthesia Quick Evaluation

## 2022-12-24 NOTE — Interval H&P Note (Signed)
History and Physical Interval Note:  12/24/2022 9:16 AM  [name] [name]  has presented today for surgery, with the diagnosis of age related nuclear cataract, right eye.  The various methods of treatment have been discussed with the patient and family. After consideration of risks, benefits and other options for treatment, the patient has consented to  Procedure(s): CATARACT EXTRACTION PHACO AND INTRAOCULAR LENS PLACEMENT (IOC) (Right) as a surgical intervention.  The patient's history has been reviewed, patient examined, no change in status, stable for surgery.  I have reviewed the patient's chart and labs.  Questions were answered to the patient's satisfaction.     Fabio Pierce

## 2022-12-24 NOTE — Transfer of Care (Signed)
Immediate Anesthesia Transfer of Care Note  Patient: SANDYE TARPEY  Procedure(s) Performed: CATARACT EXTRACTION PHACO AND INTRAOCULAR LENS PLACEMENT (IOC) (Right: Eye)  Patient Location: Short Stay  Anesthesia Type:MAC  Level of Consciousness: awake  Airway & Oxygen Therapy: Patient Spontanous Breathing  Post-op Assessment: Report given to RN  Post vital signs: Reviewed and stable  Last Vitals:  Vitals Value Taken Time  BP 142/73 12/24/22 0953  Temp 36.7 C 12/24/22 0953  Pulse 66 12/24/22 0953  Resp 18 12/24/22 0953  SpO2 99 % 12/24/22 0953    Last Pain:  Vitals:   12/24/22 0953  TempSrc: Oral  PainSc: 0-No pain         Complications: No notable events documented.

## 2022-12-24 NOTE — Discharge Instructions (Addendum)
Please discharge patient when stable, will follow up today with Dr. Carnella Fryman at the [facility] [place] office immediately following discharge.  Leave shield in place until visit.  All paperwork with discharge instructions will be given at the office.  [facility] Address:  [address]  [place], [place] [address]  

## 2022-12-28 ENCOUNTER — Encounter (HOSPITAL_COMMUNITY): Payer: Self-pay | Admitting: Ophthalmology

## 2023-01-04 NOTE — Progress Notes (Unsigned)
VIRTUAL VISIT via TELEPHONE NOTE [facility]   I connected with [name] [name]  on 01/05/23 at  2:22 PM by telephone and verified that I am speaking with the correct person using two identifiers.  Location: Patient: Home Provider: [facility]   I discussed the limitations, risks, security and privacy concerns of performing an evaluation and management service by telephone and the availability of in person appointments. I also discussed with the patient that there may be a patient responsible charge related to this service. The patient expressed understanding and agreed to proceed.  REASON FOR VISIT:  Follow-up for JAK2 polycythemia vera   PRIOR THERAPY: None   CURRENT THERAPY: Hydrea 500 mg MWF (patient not taking as prescribed) + aspirin 81 mg + phlebotomy  INTERVAL HISTORY:  [name] [name] is contacted today for follow-up of JAK2 polycythemia vera.  She was last seen by [name] Brenner PA-C on 10/13/2022.  She did not require phlebotomy at her 6-week lab check in October 2024.   At today's visit, she reports feeling fairly well apart from increased headaches over the past several weeks. She is prescribed Hydrea 500 mg MWF, but has only been taking this on Mondays and Fridays.  She has declined to take aspirin after "doing her own research."  She continues to report some nail changes from [facility], but denies any skin sores, mouth sores, fatigue, or GI upset. She reports good energy, and stays active doing cardio and weightlifting throughout the week.    Overall, she feels better after starting phlebotomy, and reports decreased headaches and improved tinnitus.  She has also been following with her PCP (Dr. Durwin Nora) for management of hypertension.  She denies any aquagenic pruritus, erythromelalgia, or B symptoms.   She has 100% energy and 100% appetite. She endorses that she is maintaining a stable weight.  REVIEW OF SYSTEMS:   Review of Systems   Constitutional:  Negative for chills, diaphoresis, fever, malaise/fatigue and weight loss.  Respiratory:  Negative for cough and shortness of breath.   Cardiovascular:  Negative for chest pain and palpitations.  Gastrointestinal:  Negative for abdominal pain, blood in stool, melena, nausea and vomiting.  Neurological:  Positive for headaches. Negative for dizziness.     PHYSICAL EXAM: (per limitations of virtual telephone visit)  The patient is alert and oriented x 3, exhibiting adequate mentation, good mood, and ability to speak in full sentences and execute sound judgement.  ASSESSMENT & PLAN:  1.  JAK2 positive polycythemia vera - Seen at request of Dr. Wolfgang Phoenix for elevated hemoglobin and platelet count (CBC from 11/20/2021 showed Hgb 16.9/hematocrit 51.1, platelets 613) - Review of EMR shows platelet count elevated since August 2015,  Hemoglobin elevated since January 2019 - No history of DVT, PE, MI, or CVA. - Hematology work-up (11/20/2021):  JAK2 V617F positive.  Low erythropoietin 1.8 - Hydrea started on 01/14/2022.  Unable to tolerate dosage >1000 mg daily due to side effects.  Currently prescribed Hydrea 500 mg MWF, but only taking on Monday and Friday. - Aspirin 81 mg recommended with extensive discussion regarding medical indication for antiplatelet agents.  Patient states that she "did her own research and knows that it could give her a heart attack," so she does not take it - She has required intermittent phlebotomy for control of hematocrit, which she is tolerating well.  Last phlebotomy was on 09/21/2022. - Labs today (01/05/2023): Hgb 14.6/hematocrit 49.2%, platelets 231.  Normal WBC/differential.  Minimally elevated  LDH 215.  Baseline CKD stage IIIa.  Normal LFTs. - She is feeling better (decreased headaches and tinnitus, improved blood pressure) after starting phlebotomy, but reports worsening headaches when hematocrit trends upward. - Extensive discussion regarding diagnosis  of polycythemia vera, including risk of thrombosis (CVA, MI, DVT, PE) as well as risk of myelofibrosis and leukemia in some patients. - Based on her age, she is considered high risk and therefore recommended cytoreductive therapy with hydroxyurea.  Due to side effects from increased dose of hydroxyurea, we will use combination of Hydrea and phlebotomy to achieve target blood counts. Patient was inquiring about treatment with PTG-300 ([place]), which is currently only available in clinical trials.  Discussed with patient that clinical trials are available in [place], and if she would like to pursue this we can place referral. - PLAN: Since patient has not been taking Hydrea as prescribed, we had discussion regarding importance of compliance.  Patient agrees to continue Hydrea 500 mg Monday and Fridays (prescription adjusted), with strict instructions to notify our office prior to making any dose changes of her own volition. - Recommend CBC + possible phlebotomy every 6 weeks (recommend phlebotomy if HCT >43.0, to see if more aggressive phlebotomy will help with headaches and blood pressure) - Recommended aspirin 81 mg daily (patient refuses) - Labs in 3 months = CBC/D, CMP, LDH  - OFFICE in 3 months (same-day labs + possible phlebotomy)    2.  Blood pressure  - Initial BP today 192/106 prior to visit, repeat BP 188/102 - Blood pressure after phlebotomy remained elevated in same range. - She reports recurrent headache today, which has been occurring more frequently - No neurologic deficits, acute chest pain, or dyspnea - PLAN: Advised to discuss hypertension management with PCP and nephrologist.  She has had ongoing difficult to treat hypertension, being managed by other providers. - Educated on alarm symptoms that would prompt immediate medical evaluation in emergency department  3.  Social/family history: - Lives at home with her family.  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  She was never smoker. - No family history of leukemias or malignancies.   PLAN SUMMARY:  >> CBC + possible therapeutic phlebotomy every 6 weeks (if HCT > 43.0) >> Labs in 3 months = CBC/D, CMP, LDH >> OFFICE visit in 3 months (same day as labs + possible phlebotomy)     I discussed the assessment and treatment plan with the patient. The patient was provided an opportunity to ask questions and all were answered. The patient agreed with the plan and demonstrated an understanding of the instructions.   The patient was advised to call back or seek an in-person evaluation if the symptoms worsen or if the condition fails to improve as anticipated.  I provided 25 minutes of non-face-to-face time during this encounter.  Carnella Guadalajara, PA-C 01/05/23 7:26 PM

## 2023-01-05 ENCOUNTER — Inpatient Hospital Stay: Payer: Medicare HMO

## 2023-01-05 ENCOUNTER — Inpatient Hospital Stay: Payer: Medicare HMO | Attending: Hematology | Admitting: Physician Assistant

## 2023-01-05 VITALS — BP 188/102 | HR 80 | Resp 16 | Wt 184.6 lb

## 2023-01-05 DIAGNOSIS — D45 Polycythemia vera: Secondary | ICD-10-CM

## 2023-01-05 DIAGNOSIS — I10 Essential (primary) hypertension: Secondary | ICD-10-CM | POA: Diagnosis not present

## 2023-01-05 DIAGNOSIS — Z1589 Genetic susceptibility to other disease: Secondary | ICD-10-CM

## 2023-01-05 LAB — COMPREHENSIVE METABOLIC PANEL
ALT: 22 U/L (ref 0–44)
AST: 25 U/L (ref 15–41)
Albumin: 3.8 g/dL (ref 3.5–5.0)
Alkaline Phosphatase: 50 U/L (ref 38–126)
Anion gap: 8 (ref 5–15)
BUN: 16 mg/dL (ref 8–23)
CO2: 25 mmol/L (ref 22–32)
Calcium: 9 mg/dL (ref 8.9–10.3)
Chloride: 104 mmol/L (ref 98–111)
Creatinine, Ser: 1.14 mg/dL — ABNORMAL HIGH (ref 0.44–1.00)
GFR, Estimated: 50 mL/min — ABNORMAL LOW (ref >60)
Glucose, Bld: 119 mg/dL — ABNORMAL HIGH (ref 70–99)
Potassium: 4 mmol/L (ref 3.5–5.1)
Sodium: 137 mmol/L (ref 135–145)
Total Bilirubin: 0.9 mg/dL (ref <1.2)
Total Protein: 7.5 g/dL (ref 6.5–8.1)

## 2023-01-05 LAB — CBC WITH DIFFERENTIAL/PLATELET
Abs Immature Granulocytes: 0.01 10*3/uL (ref 0.00–0.07)
Basophils Absolute: 0 10*3/uL (ref 0.0–0.1)
Basophils Relative: 0 %
Eosinophils Absolute: 0.2 10*3/uL (ref 0.0–0.5)
Eosinophils Relative: 4 %
HCT: 49.2 % — ABNORMAL HIGH (ref 36.0–46.0)
Hemoglobin: 14.6 g/dL (ref 12.0–15.0)
Immature Granulocytes: 0 %
Lymphocytes Relative: 38 %
Lymphs Abs: 1.9 10*3/uL (ref 0.7–4.0)
MCH: 24.2 pg — ABNORMAL LOW (ref 26.0–34.0)
MCHC: 29.7 g/dL — ABNORMAL LOW (ref 30.0–36.0)
MCV: 81.5 fL (ref 80.0–100.0)
Monocytes Absolute: 0.2 10*3/uL (ref 0.1–1.0)
Monocytes Relative: 4 %
Neutro Abs: 2.6 10*3/uL (ref 1.7–7.7)
Neutrophils Relative %: 54 %
Platelets: 231 10*3/uL (ref 150–400)
RBC: 6.04 MIL/uL — ABNORMAL HIGH (ref 3.87–5.11)
RDW: 20.3 % — ABNORMAL HIGH (ref 11.5–15.5)
WBC: 4.8 10*3/uL (ref 4.0–10.5)
nRBC: 0 % (ref 0.0–0.2)

## 2023-01-05 LAB — LACTATE DEHYDROGENASE: LDH: 215 U/L — ABNORMAL HIGH (ref 98–192)

## 2023-01-05 MED ORDER — HYDROXYUREA 500 MG PO CAPS: 500.0000 mg | ORAL_CAPSULE | ORAL | 2 refills | Status: DC

## 2023-01-05 NOTE — Progress Notes (Signed)
Patient presents today for Phlebotomy per providers order.  Labs and vitals reviewed by PA.  Message received from Rojelio Brenner PA patient okay for phlebotomy.  Patient presents today for phlebotomy per MD orders. Phlebotomy procedure started at 1453 and ended at 1505, 5.1 oz removed. Restarted (2nd stick) at 1510 and ended at 1528, removed 15 oz, for a total of 20oz. Patient tolerated procedure well. Procedure tolerated well and without incident. Follow up BP 183/113 PA notified.  Per PA, patient should recheck BP at home and if greater 160/90 or she experiences any neurological symptoms that  patient is to contact PCP or go to the emergency room.    Patient expressed understanding.  Discharged ambulatory in stable condition.

## 2023-01-05 NOTE — Patient Instructions (Signed)
[facility] - A DEPT OF [facility]  Discharge Instructions: Thank you for choosing [facility] to provide your oncology and hematology care.  If you have a lab appointment with the Cancer Center - please note that after April 8th, 2024, all labs will be drawn in the cancer center.  You do not have to check in or register with the main entrance as you have in the past but will complete your check-in in the cancer center.  Wear comfortable clothing and clothing appropriate for easy access to any Portacath or PICC line.   We strive to give you quality time with your provider. You may need to reschedule your appointment if you arrive late (15 or more minutes).  Arriving late affects you and other patients whose appointments are after yours.  Also, if you miss three or more appointments without notifying the office, you may be dismissed from the clinic at the provider's discretion.      For prescription refill requests, have your pharmacy contact our office and allow 72 hours for refills to be completed.    Today you received the following chemotherapy and/or immunotherapy agents Phelbotomy      To help prevent nausea and vomiting after your treatment, we encourage you to take your nausea medication as directed.  BELOW ARE SYMPTOMS THAT SHOULD BE REPORTED IMMEDIATELY: *FEVER GREATER THAN 100.4 F (38 C) OR HIGHER *CHILLS OR SWEATING *NAUSEA AND VOMITING THAT IS NOT CONTROLLED WITH YOUR NAUSEA MEDICATION *UNUSUAL SHORTNESS OF BREATH *UNUSUAL BRUISING OR BLEEDING *URINARY PROBLEMS (pain or burning when urinating, or frequent urination) *BOWEL PROBLEMS (unusual diarrhea, constipation, pain near the anus) TENDERNESS IN MOUTH AND THROAT WITH OR WITHOUT PRESENCE OF ULCERS (sore throat, sores in mouth, or a toothache) UNUSUAL RASH, SWELLING OR PAIN  UNUSUAL VAGINAL DISCHARGE OR ITCHING   Items with * indicate a potential emergency and should be followed  up as soon as possible or go to the Emergency Department if any problems should occur.  Please show the CHEMOTHERAPY ALERT CARD or IMMUNOTHERAPY ALERT CARD at check-in to the Emergency Department and triage nurse.  Should you have questions after your visit or need to cancel or reschedule your appointment, please contact  CANCER CENTER - A DEPT OF [facility] [phone]  and follow the prompts.  Office hours are 8:00 a.m. to 4:30 p.m. Monday - Friday. Please note that voicemails left after 4:00 p.m. may not be returned until the following business day.  We are closed weekends and major holidays. You have access to a nurse at all times for urgent questions. Please call the main number to the clinic [phone] and follow the prompts.  For any non-urgent questions, you may also contact your provider using MyChart. We now offer e-Visits for anyone 68 and older to request care online for non-urgent symptoms. For details visit [email or web address].   Also download the MyChart app! Go to the app store, search "MyChart", open the app, select , and log in with your MyChart username and password.

## 2023-01-14 DIAGNOSIS — J069 Acute upper respiratory infection, unspecified: Secondary | ICD-10-CM | POA: Diagnosis not present

## 2023-01-21 DIAGNOSIS — R6889 Other general symptoms and signs: Secondary | ICD-10-CM | POA: Diagnosis not present

## 2023-01-28 DIAGNOSIS — Z0100 Encounter for examination of eyes and vision without abnormal findings: Secondary | ICD-10-CM | POA: Diagnosis not present

## 2023-02-15 ENCOUNTER — Inpatient Hospital Stay: Payer: Medicare HMO

## 2023-02-15 ENCOUNTER — Other Ambulatory Visit: Payer: Self-pay

## 2023-02-15 ENCOUNTER — Inpatient Hospital Stay: Payer: Medicare HMO | Attending: Hematology

## 2023-02-15 DIAGNOSIS — D45 Polycythemia vera: Secondary | ICD-10-CM | POA: Insufficient documentation

## 2023-02-15 DIAGNOSIS — N1831 Chronic kidney disease, stage 3a: Secondary | ICD-10-CM

## 2023-02-15 LAB — RENAL FUNCTION PANEL
Albumin: 3.7 g/dL (ref 3.5–5.0)
Anion gap: 7 (ref 5–15)
BUN: 20 mg/dL (ref 8–23)
CO2: 26 mmol/L (ref 22–32)
Calcium: 8.8 mg/dL — ABNORMAL LOW (ref 8.9–10.3)
Chloride: 101 mmol/L (ref 98–111)
Creatinine, Ser: 1.06 mg/dL — ABNORMAL HIGH (ref 0.44–1.00)
GFR, Estimated: 54 mL/min — ABNORMAL LOW (ref >60)
Glucose, Bld: 122 mg/dL — ABNORMAL HIGH (ref 70–99)
Phosphorus: 2.1 mg/dL — ABNORMAL LOW (ref 2.5–4.6)
Potassium: 3.1 mmol/L — ABNORMAL LOW (ref 3.5–5.1)
Sodium: 134 mmol/L — ABNORMAL LOW (ref 135–145)

## 2023-02-15 LAB — CBC
HCT: 46.7 % — ABNORMAL HIGH (ref 36.0–46.0)
Hemoglobin: 13.9 g/dL (ref 12.0–15.0)
MCH: 23.4 pg — ABNORMAL LOW (ref 26.0–34.0)
MCHC: 29.8 g/dL — ABNORMAL LOW (ref 30.0–36.0)
MCV: 78.8 fL — ABNORMAL LOW (ref 80.0–100.0)
Platelets: 258 10*3/uL (ref 150–400)
RBC: 5.93 MIL/uL — ABNORMAL HIGH (ref 3.87–5.11)
RDW: 18.3 % — ABNORMAL HIGH (ref 11.5–15.5)
WBC: 6.4 10*3/uL (ref 4.0–10.5)
nRBC: 0 % (ref 0.0–0.2)

## 2023-02-15 LAB — PROTEIN, URINE, RANDOM: Total Protein, Urine: <6 mg/dL

## 2023-02-15 LAB — CREATININE, URINE, RANDOM: Creatinine, Urine: 18 mg/dL

## 2023-02-15 NOTE — Patient Instructions (Signed)
 [facility] - [facility]  Discharge Instructions: Thank you for choosing [facility] to provide your oncology and hematology care.  If you have a lab appointment with the Cancer Center - please note that after April 8th, 2024, all labs will be drawn in the cancer center.  You do not have to check in or register with the main entrance as you have in the past but will complete your check-in in the cancer center.  Wear comfortable clothing and clothing appropriate for easy access to any Portacath or PICC line.   We strive to give you quality time with your provider. You may need to reschedule your appointment if you arrive late (15 or more minutes).  Arriving late affects you and other patients whose appointments are after yours.  Also, if you miss three or more appointments without notifying the office, you may be dismissed from the clinic at the provider's discretion.      For prescription refill requests, have your pharmacy contact our office and allow 72 hours for refills to be completed.    Today you received the following chemotherapy and/or immunotherapy agents phlebotomy. Therapeutic Phlebotomy, Care After The following information offers guidance on how to care for yourself after your procedure. Your health care provider may also give you more specific instructions. If you have problems or questions, contact your health care provider. What can I expect after the procedure? After therapeutic phlebotomy, it is common to have: Light-headedness or dizziness. You may feel faint. Nausea. Tiredness (fatigue). Follow these instructions at home: Eating and drinking Be sure to eat well-balanced meals for the next 24 hours. Drink enough fluid to keep your urine pale yellow. Avoid drinking alcohol on the day that you had the procedure. Activity  Return to your normal activities as told by your health care provider. Most people can go back  to their normal activities right away. Avoid activities that take a lot of effort for about 5 hours after the procedure. Athletes should avoid strenuous exercise for at least 12 hours. Avoid heavy lifting or pulling for about 5 hours after the procedure. Do not lift anything that is heavier than 10 lb (4.5 kg). Change positions slowly for the remainder of the day, like from sitting to standing. This can help prevent light-headedness or fainting. If you feel light-headed, lie down until the feeling goes away. Needle insertion site care  Keep your bandage (dressing) dry. You can remove the bandage after about 5 hours or as told by your health care provider. If you have bleeding from the needle insertion site, raise (elevate) your arm and press firmly on the site until the bleeding stops. If you have bruising at the site, apply ice to the area. To do this: Put ice in a plastic bag. Place a towel between your skin and the bag. Leave the ice on for 20 minutes, 2-3 times a day for the first 24 hours. Remove the ice if your skin turns bright red so you do not damage the area. If the swelling does not go away after 24 hours, apply a warm, moist cloth (warm compress) to the area for 20 minutes, 2-3 times a day. General instructions Do not use any products that contain nicotine or tobacco, like cigarettes, chewing tobacco, and vaping devices, such as e-cigarettes, for at least 30 minutes after the procedure. If you need help quitting, ask your health care provider. Keep all follow-up visits.  You may need to continue having regular blood tests and therapeutic phlebotomy treatments as directed. Contact a health care provider if: You have redness, swelling, or pain at the needle insertion site. Fluid or blood is coming from the needle insertion site. Pus or a bad smell is coming from the needle insertion site. The needle insertion site feels warm to the touch. You feel light-headed, dizzy, or nauseous, and  the feeling does not go away. You have new bruising at the needle insertion site. You feel weaker than normal. You have a fever or chills. Get help right away if: You have chest pain. You have trouble breathing. You have severe nausea or vomiting. Summary After the procedure, it is common to have some light-headedness, dizziness, nausea, or tiredness (fatigue). Be sure to eat well-balanced meals for the next 24 hours. Drink enough fluid to keep your urine pale yellow. Return to your normal activities as told by your health care provider. Keep all follow-up visits. You may need to continue having regular blood tests and therapeutic phlebotomy treatments as directed. This information is not intended to replace advice given to you by your health care provider. Make sure you discuss any questions you have with your health care provider. Document Revised: 07/23/2020 Document Reviewed: 07/23/2020 Elsevier Patient Education  2024 Elsevier Inc.       To help prevent nausea and vomiting after your treatment, we encourage you to take your nausea medication as directed.  BELOW ARE SYMPTOMS THAT SHOULD BE REPORTED IMMEDIATELY: *FEVER GREATER THAN 100.4 F (38 C) OR HIGHER *CHILLS OR SWEATING *NAUSEA AND VOMITING THAT IS NOT CONTROLLED WITH YOUR NAUSEA MEDICATION *UNUSUAL SHORTNESS OF BREATH *UNUSUAL BRUISING OR BLEEDING *URINARY PROBLEMS (pain or burning when urinating, or frequent urination) *BOWEL PROBLEMS (unusual diarrhea, constipation, pain near the anus) TENDERNESS IN MOUTH AND THROAT WITH OR WITHOUT PRESENCE OF ULCERS (sore throat, sores in mouth, or a toothache) UNUSUAL RASH, SWELLING OR PAIN  UNUSUAL VAGINAL DISCHARGE OR ITCHING   Items with * indicate a potential emergency and should be followed up as soon as possible or go to the Emergency Department if any problems should occur.  Please show the CHEMOTHERAPY ALERT CARD or IMMUNOTHERAPY ALERT CARD at check-in to the Emergency  Department and triage nurse.  Should you have questions after your visit or need to cancel or reschedule your appointment, please contact [facility] [place] - A DEPT OF [facility]igh[facility] Brideg[facility] [phone]  and follow the prompts.  Office hours are 8:00 a.m. to 4:30 p.m. Monday - Friday. Please note that voicemails left after 4:00 p.m. may not be returned until the following business day.  We are closed weekends and major holidays. You have access to a nurse at all times for urgent questions. Please call the main number to the clinic [phone] and follow the prompts.  For any non-urgent questions, you may also contact your provider using MyChart. We now offer e-Visits for anyone 78 and older to request care online for non-urgent symptoms. For details visit [email or web address].   Also download the MyChart app! Go to the app store, search "MyChart", open the app, select [place], and log in with your MyChart username and password.

## 2023-02-15 NOTE — Progress Notes (Signed)
 [name] [name] presents for therapeutic phlebotomy per MD orders. Last HGB 13.9 / HCT 46.7 on 02-15-2023 . Vital signs stable prior to procedure. Procedure started at 15:20 pm and ended at 15:40 pm. 500 mls of blood removed. Patient denies any dizziness , lightheadedness, or feeling faint.  Gauze and coban applied to site. Vital signs stable at completion of procedure. Patient has no complaints at this time. Alert and oriented x 3. Discharged in stable condition.

## 2023-02-16 DIAGNOSIS — I129 Hypertensive chronic kidney disease with stage 1 through stage 4 chronic kidney disease, or unspecified chronic kidney disease: Secondary | ICD-10-CM | POA: Diagnosis not present

## 2023-02-16 DIAGNOSIS — N1831 Chronic kidney disease, stage 3a: Secondary | ICD-10-CM | POA: Diagnosis not present

## 2023-02-16 DIAGNOSIS — I5032 Chronic diastolic (congestive) heart failure: Secondary | ICD-10-CM | POA: Diagnosis not present

## 2023-02-16 DIAGNOSIS — D45 Polycythemia vera: Secondary | ICD-10-CM | POA: Diagnosis not present

## 2023-02-16 LAB — PTH, INTACT AND CALCIUM
Calcium, Total (PTH): 8.9 mg/dL (ref 8.7–10.3)
PTH: 50 pg/mL (ref 15–65)

## 2023-03-17 ENCOUNTER — Encounter: Payer: Self-pay | Admitting: Hematology

## 2023-03-25 NOTE — Progress Notes (Signed)
Message sent to Durenda Hurt NP: Patient sees Rebekah for JAK2+ PV, getting phlebotomies every 6 weeks. She is on Hyrdea 500mg  twice weekly and declines taking 81mg  ASA. She calls and says that she has a family emergency and needs to fly. Are there any flying restrictions for her?   Messaged from Durenda Hurt NP that patient was good to go on flying.  Patient called and aware and agreeable.

## 2023-04-04 ENCOUNTER — Inpatient Hospital Stay: Payer: Medicare Other

## 2023-04-04 ENCOUNTER — Inpatient Hospital Stay: Payer: Medicare Other | Admitting: Physician Assistant

## 2023-04-11 ENCOUNTER — Encounter: Payer: Self-pay | Admitting: Hematology

## 2023-04-16 NOTE — Progress Notes (Unsigned)
 [name] [name], [place] [address]   CLINIC:  Medical Oncology/Hematology  PCP:  [name] Lade, MD [address] [place] [address] [phone]   REASON FOR VISIT:  Follow-up for JAK2 polycythemia vera   PRIOR THERAPY: None   CURRENT THERAPY: Hydrea 500 mg MWF*** (patient not taking as prescribed) + aspirin 81 mg + phlebotomy  INTERVAL HISTORY:   [name] [name] 78 y.o. female returns for routine follow-up of JAK2 polycythemia vera.  She was last evaluated via telemedicine visit by [name] Brenner PA-C on 01/05/2023.  She had phlebotomy on 01/05/2023 and 02/15/2023.   At today's visit, she reports feeling fairly well apart from increased headaches over the past several weeks.  ***  *** Did more aggressive phlebotomy (HCT >43) help with headaches and blood pressure?  ***  ***She is prescribed Hydrea 500 mg MWF, but has only been taking this on Mondays and Fridays.  *** Rx adjusted to Monday + Friday after last appointment *** ***She has declined to take aspirin after "doing her own research."   ***She continues to report some nail changes from [name], but denies any skin sores, mouth sores, fatigue, or GI upset.  ***She reports good energy, and stays active doing cardio and weightlifting throughout the week.    Overall, she feels better after starting phlebotomy, and reports decreased headaches and improved tinnitus.  *** ***She has also been following with her PCP ([name] [name]) for management of hypertension.   ***She denies any aquagenic pruritus, erythromelalgia, or B symptoms.  She has ***% energy and ***% appetite. She endorses that she is maintaining a stable weight.   ASSESSMENT & PLAN:  1.  JAK2 positive polycythemia vera - Seen at request of Dr. Wolfgang Phoenix for elevated hemoglobin and platelet count (CBC from 11/20/2021 showed Hgb 16.9/hematocrit 51.1, platelets 613) - Review of EMR shows platelet count elevated since August  2015,  Hemoglobin elevated since January 2019 - No history of DVT, PE, MI, or CVA. - Hematology work-up (11/20/2021):  JAK2 V617F positive.  Low erythropoietin 1.8 - Hydrea started on 01/14/2022.  Unable to tolerate dosage >1000 mg daily due to side effects.  Currently prescribed Hydrea 500 mg MWF, but only taking on Monday and Friday. - Aspirin 81 mg recommended with extensive discussion regarding medical indication for antiplatelet agents.  Patient states that she "did her own research and knows that it could give her a heart attack," so she does not take it*** - She has required intermittent phlebotomy for control of hematocrit, which she is tolerating well.  Last phlebotomy was on 02/15/2023. - Labs today (***): Hgb ***/hematocrit ***%, platelets ***.  Normal WBC/differential. *** Minimally elevated LDH ***.  Baseline CKD stage IIIa. *** Normal LFTs. - She is feeling better (decreased headaches and tinnitus, improved blood pressure) after starting phlebotomy, but reports worsening headaches when hematocrit trends upward.*** - Extensive discussion regarding diagnosis of polycythemia vera, including risk of thrombosis (CVA, MI, DVT, PE) as well as risk of myelofibrosis and leukemia in some patients. - Based on her age, she is considered high risk and therefore recommended cytoreductive therapy with hydroxyurea.  Due to side effects from increased dose of hydroxyurea, we will use combination of Hydrea and phlebotomy to achieve target blood counts. Patient was inquiring about treatment with PTG-300 ([place]), which is currently only available in clinical trials.  Discussed with patient that clinical trials are available in [place], and if she would like to pursue  this we can place referral. - PLAN: Since patient has not been taking Hydrea as prescribed, we had discussion regarding importance of compliance.  ***Patient agrees to continue Hydrea 500 mg Monday and Fridays (prescription adjusted), with  strict instructions to notify our office prior to making any dose changes of her own volition.*** - Recommend CBC + possible phlebotomy every 6 weeks (recommend phlebotomy if HCT >43.0, to see if more aggressive phlebotomy will help with headaches and blood pressure)*** - Recommended aspirin 81 mg daily (patient refuses)*** - Labs in 3 months = CBC/D, CMP, LDH *** - OFFICE in 3 months (same-day labs + possible phlebotomy) ***   2.  Blood pressure *** - Initial BP today 192/106 prior to visit, repeat BP 188/102 - Blood pressure after phlebotomy remained elevated in same range. - She reports recurrent headache today, which has been occurring more frequently - No neurologic deficits, acute chest pain, or dyspnea - PLAN: Advised to discuss hypertension management with PCP and nephrologist.  She has had ongoing difficult to treat hypertension, being managed by other providers. - Educated on alarm symptoms that would prompt immediate medical evaluation in emergency department   3.  Social/family history: - Lives at home with her family.  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  She was never smoker. - No family history of leukemias or malignancies.  PLAN SUMMARY: *** TBD *** >> CBC + possible therapeutic phlebotomy every 6 weeks (if HCT > 43.0) >> Labs in 3 months = CBC/D, CMP, LDH >> OFFICE visit in 3 months (same day as labs + possible phlebotomy) >> *** >> ***    REVIEW OF SYSTEMS: ***  Review of Systems - Oncology   PHYSICAL EXAM:  ECOG PERFORMANCE STATUS: {CHL ONC ECOG QI:[id number]} *** There were no vitals filed for this visit. There were no vitals filed for this visit. Physical Exam  PAST MEDICAL/SURGICAL HISTORY:  Past Medical History:  Diagnosis Date   Anxiety    Dysphagia 09/03/2020   Essential hypertension    HLD (hyperlipidemia)    Hx of statin intolerance   Hypothyroidism    NSTEMI (non-ST  elevated myocardial infarction) (HCC) 2015   a.  Related to occlusion of the distal Cx. The mid to distal OM1 also contains a significant stenosis that is best treated with medical therapy. Patent RCA/LAD with tortuosity. LM patent. EF 55%.    Polycythemia vera (HCC)    Stage 3b chronic kidney disease    Past Surgical History:  Procedure Laterality Date   ABDOMINAL HYSTERECTOMY     CARDIAC CATHETERIZATION  ~ 2000   CATARACT EXTRACTION W/PHACO Right 12/24/2022   Procedure: CATARACT EXTRACTION PHACO AND INTRAOCULAR LENS PLACEMENT (IOC);  Surgeon: Fabio Pierce, MD;  Location: [facility];  Service: Ophthalmology;  Laterality: Right;  CDE - 15.11   CORONARY ANGIOPLASTY  09/20/2013   LEFT HEART CATHETERIZATION WITH CORONARY ANGIOGRAM N/A 09/20/2013   Procedure: LEFT HEART CATHETERIZATION WITH CORONARY ANGIOGRAM;  Surgeon: Lesleigh Noe, MD;  Location: [facility] CATH LAB;  Service: Cardiovascular;  Laterality: N/A;   LEFT HEART CATHETERIZATION WITH CORONARY ANGIOGRAM N/A 09/27/2013   Procedure: LEFT HEART CATHETERIZATION WITH CORONARY ANGIOGRAM;  Surgeon: Micheline Chapman, MD;  Location: [facility] CATH LAB;  Service: Cardiovascular;  Laterality: N/A;   THYROIDECTOMY  10/08/2011   Procedure: THYROIDECTOMY;  Surgeon: Dalia Heading, MD;  Location: [facility];  Service: General;  Laterality: N/A;  Total   VAGINAL  HYSTERECTOMY  1993    SOCIAL HISTORY:  Social History   Socioeconomic History   Marital status: Widowed    Spouse name: Not on file   Number of children: Not on file   Years of education: Not on file   Highest education level: Not on file  Occupational History   Not on file  Tobacco Use   Smoking status: Never   Smokeless tobacco: Never  Vaping Use   Vaping status: Never Used  Substance and Sexual Activity   Alcohol use: No    Alcohol/week: 0.0 standard drinks of alcohol   Drug use: No   Sexual activity: Never  Other Topics Concern   Not on file  Social History Narrative   ** Merged History  Encounter **  Lives at home with family.  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      Social Drivers of Corporate investment banker Strain: Low Risk  (07/21/2020)   Overall Financial Resource Strain (CARDIA)    Difficulty of Paying Living Expenses: Not very hard  Food Insecurity: No Food Insecurity (07/21/2020)   Hunger Vital Sign    Worried About Running Out of Food in the Last Year: Never true    Ran Out of Food in the Last Year: Never true  Transportation Needs: No Transportation Needs (09/08/2020)   PRAPARE - Administrator, Civil Service (Medical): No    Lack of Transportation (Non-Medical): No  Physical Activity: Sufficiently Active (03/11/2020)   Exercise Vital Sign    Days of Exercise per Week: 5 days    Minutes of Exercise per Session: 30 min  Stress: No Stress Concern Present (03/11/2020)   [employer] of Occupational Health - Occupational Stress Questionnaire    Feeling of Stress : Not at all  Social Connections: Not on file  Intimate Partner Violence: Not on file    FAMILY HISTORY:  Family History  Problem Relation Age of Onset   Colon cancer Father    Thyroid disease Neg Hx     CURRENT MEDICATIONS:  Outpatient Encounter Medications as of 04/18/2023  Medication Sig   Blood Pressure Monitoring KIT 1 each by Does not apply route as directed. Dx: resistant hypertension   chlorthalidone (HYGROTON) 25 MG tablet Take 1 tablet (25 mg total) by mouth daily.   hydroxyurea (HYDREA) 500 MG capsule Take 1 capsule (500 mg total) by mouth 2 (two) times a week. Take on Monday and Friday.  May take with food to minimize GI side effects.   spironolactone (ALDACTONE) 25 MG tablet Take 1 tablet (25 mg total) by mouth daily.   No facility-administered encounter medications on file as of 04/18/2023.    ALLERGIES:  Allergies  Allergen Reactions   Fluviral [Influenza Virus Vaccine] Shortness Of Breath, Swelling and Other (See Comments)    Including back pain that radiates in upper  extremeties   Haemophilus Influenzae Other (See Comments), Shortness Of Breath and Swelling    Including back pain that radiates in upper extremeties   Hydralazine Shortness Of Breath   Amlodipine     Lip swelling per patient.    Lisinopril     Dr. Felecia Shelling stopped due to side effects per patient.  Could remember what side effects were.     Ranexa [Ranolazine]     Chest pain    LABORATORY DATA:  I have reviewed the labs as listed.  CBC    Component Value Date/Time   WBC 6.4 02/15/2023 1355   RBC 5.93 (  H) 02/15/2023 1355   HGB 13.9 02/15/2023 1355   HCT 46.7 (H) 02/15/2023 1355   PLT 258 02/15/2023 1355   MCV 78.8 (L) 02/15/2023 1355   MCH 23.4 (L) 02/15/2023 1355   MCHC 29.8 (L) 02/15/2023 1355   RDW 18.3 (H) 02/15/2023 1355   LYMPHSABS 1.9 01/05/2023 1332   MONOABS 0.2 01/05/2023 1332   EOSABS 0.2 01/05/2023 1332   BASOSABS 0.0 01/05/2023 1332      Latest Ref Rng & Units 02/15/2023    2:05 PM 01/05/2023    1:32 PM 10/08/2022    1:10 PM  CMP  Glucose 70 - 99 mg/dL 086  578  92   BUN 8 - 23 mg/dL 20  16  24    Creatinine 0.44 - 1.00 mg/dL 4.69  6.29  5.28   Sodium 135 - 145 mmol/L 134  137  135   Potassium 3.5 - 5.1 mmol/L 3.1  4.0  3.7   Chloride 98 - 111 mmol/L 101  104  101   CO2 22 - 32 mmol/L 26  25  25    Calcium 8.7 - 10.3 mg/dL 8.9 - 41.3 mg/dL 8.9    8.8  9.0  8.6   Total Protein 6.5 - 8.1 g/dL  7.5  7.0   Total Bilirubin <1.2 mg/dL  0.9  0.7   Alkaline Phos 38 - 126 U/L  50  57   AST 15 - 41 U/L  25  26   ALT 0 - 44 U/L  22  21     DIAGNOSTIC IMAGING:  I have independently reviewed the relevant imaging and discussed with the patient.   WRAP UP:  All questions were answered. The patient knows to call the clinic with any problems, questions or concerns.  Medical decision making: ***  Time spent on visit: I spent *** minutes counseling the patient face to face. The total time spent in the appointment was *** minutes and more than 50% was on  counseling.  Carnella Guadalajara, PA-C  ***

## 2023-04-17 DIAGNOSIS — J00 Acute nasopharyngitis [common cold]: Secondary | ICD-10-CM | POA: Diagnosis not present

## 2023-04-18 ENCOUNTER — Inpatient Hospital Stay (HOSPITAL_BASED_OUTPATIENT_CLINIC_OR_DEPARTMENT_OTHER): Payer: Medicare Other | Admitting: Physician Assistant

## 2023-04-18 ENCOUNTER — Inpatient Hospital Stay: Payer: Medicare Other | Attending: Hematology

## 2023-04-18 VITALS — BP 169/92 | HR 82 | Temp 96.9°F | Resp 16 | Wt 185.2 lb

## 2023-04-18 DIAGNOSIS — R7402 Elevation of levels of lactic acid dehydrogenase (LDH): Secondary | ICD-10-CM | POA: Insufficient documentation

## 2023-04-18 DIAGNOSIS — D45 Polycythemia vera: Secondary | ICD-10-CM | POA: Diagnosis not present

## 2023-04-18 DIAGNOSIS — Z1589 Genetic susceptibility to other disease: Secondary | ICD-10-CM | POA: Diagnosis not present

## 2023-04-18 DIAGNOSIS — N1832 Chronic kidney disease, stage 3b: Secondary | ICD-10-CM | POA: Insufficient documentation

## 2023-04-18 DIAGNOSIS — N1831 Chronic kidney disease, stage 3a: Secondary | ICD-10-CM

## 2023-04-18 DIAGNOSIS — Z800 Family history of malignant neoplasm of digestive organs: Secondary | ICD-10-CM | POA: Diagnosis not present

## 2023-04-18 DIAGNOSIS — I129 Hypertensive chronic kidney disease with stage 1 through stage 4 chronic kidney disease, or unspecified chronic kidney disease: Secondary | ICD-10-CM | POA: Diagnosis not present

## 2023-04-18 LAB — CBC WITH DIFFERENTIAL/PLATELET
Abs Immature Granulocytes: 0.02 10*3/uL (ref 0.00–0.07)
Basophils Absolute: 0 10*3/uL (ref 0.0–0.1)
Basophils Relative: 0 %
Eosinophils Absolute: 0.4 10*3/uL (ref 0.0–0.5)
Eosinophils Relative: 7 %
HCT: 46.1 % — ABNORMAL HIGH (ref 36.0–46.0)
Hemoglobin: 13.5 g/dL (ref 12.0–15.0)
Immature Granulocytes: 0 %
Lymphocytes Relative: 21 %
Lymphs Abs: 1.4 10*3/uL (ref 0.7–4.0)
MCH: 23.2 pg — ABNORMAL LOW (ref 26.0–34.0)
MCHC: 29.3 g/dL — ABNORMAL LOW (ref 30.0–36.0)
MCV: 79.2 fL — ABNORMAL LOW (ref 80.0–100.0)
Monocytes Absolute: 0.3 10*3/uL (ref 0.1–1.0)
Monocytes Relative: 5 %
Neutro Abs: 4.5 10*3/uL (ref 1.7–7.7)
Neutrophils Relative %: 67 %
Platelets: 324 10*3/uL (ref 150–400)
RBC: 5.82 MIL/uL — ABNORMAL HIGH (ref 3.87–5.11)
RDW: 18.7 % — ABNORMAL HIGH (ref 11.5–15.5)
WBC: 6.7 10*3/uL (ref 4.0–10.5)
nRBC: 0 % (ref 0.0–0.2)

## 2023-04-18 LAB — COMPREHENSIVE METABOLIC PANEL
ALT: 20 U/L (ref 0–44)
AST: 27 U/L (ref 15–41)
Albumin: 3.8 g/dL (ref 3.5–5.0)
Alkaline Phosphatase: 54 U/L (ref 38–126)
Anion gap: 11 (ref 5–15)
BUN: 19 mg/dL (ref 8–23)
CO2: 26 mmol/L (ref 22–32)
Calcium: 9 mg/dL (ref 8.9–10.3)
Chloride: 102 mmol/L (ref 98–111)
Creatinine, Ser: 1.35 mg/dL — ABNORMAL HIGH (ref 0.44–1.00)
GFR, Estimated: 40 mL/min — ABNORMAL LOW (ref >60)
Glucose, Bld: 123 mg/dL — ABNORMAL HIGH (ref 70–99)
Potassium: 3.3 mmol/L — ABNORMAL LOW (ref 3.5–5.1)
Sodium: 139 mmol/L (ref 135–145)
Total Bilirubin: 1 mg/dL (ref 0.0–1.2)
Total Protein: 7.5 g/dL (ref 6.5–8.1)

## 2023-04-18 LAB — LACTATE DEHYDROGENASE: LDH: 207 U/L — ABNORMAL HIGH (ref 98–192)

## 2023-04-18 NOTE — Patient Instructions (Signed)
 [facility] at [facility] **VISIT SUMMARY & IMPORTANT INSTRUCTIONS **   You were seen today by Rojelio Brenner PA-C for your polycythemia vera.    POLYCYTHEMIA VERA Your blood counts are higher than where they need to be.  Your white blood cells and platelets look great, but your red blood cells/hematocrit are elevated. Please take a consistent dose of your Hydrea - take this 1 tablet each day on Monday and Friday.  It is important to take this consistently so that we know if it is working or not. We will check your blood in 6 weeks to see if you need another phlebotomy.  FOLLOW-UP APPOINTMENT: Office visit in 3 months  ** Thank you for trusting me with your healthcare!  I strive to provide all of my patients with quality care at each visit.  If you receive a survey for this visit, I would be so grateful to you for taking the time to provide feedback.  Thank you in advance!  ~ Breylon Sherrow                   Dr. Doreatha Massed   &   Rojelio Brenner, PA-C   - - - - - - - - - - - - - - - - - -    Thank you for choosing [facility] at [facility] to provide your oncology and hematology care.  To afford each patient quality time with our provider, please arrive at least 15 minutes before your scheduled appointment time.   If you have a lab appointment with the Cancer Center please come in thru the Main Entrance and check in at the main information desk.  You need to re-schedule your appointment should you arrive 10 or more minutes late.  We strive to give you quality time with our providers, and arriving late affects you and other patients whose appointments are after yours.  Also, if you no show three or more times for appointments you may be dismissed from the clinic at the providers discretion.     Again, thank you for choosing [facility].  Our hope is that these requests will decrease the amount of time that you wait before being  seen by our physicians.       _____________________________________________________________  Should you have questions after your visit to [facility], please contact our office at [phone] and follow the prompts.  Our office hours are 8:00 a.m. and 4:30 p.m. Monday - Friday.  Please note that voicemails left after 4:00 p.m. may not be returned until the following business day.  We are closed weekends and major holidays.  You do have access to a nurse 24-7, just call the main number to the clinic [phone] and do not press any options, hold on the line and a nurse will answer the phone.    For prescription refill requests, have your pharmacy contact our office and allow 72 hours.

## 2023-04-20 ENCOUNTER — Encounter: Payer: Self-pay | Admitting: Hematology

## 2023-04-20 ENCOUNTER — Inpatient Hospital Stay: Payer: Medicare Other

## 2023-04-20 NOTE — Progress Notes (Signed)
 Patient stated she has had a cold since last Friday and has not been drinking enough fluids.  Patient to return Monday for phlebotomy.   [name] [name] presents today for phlebotomy per MD orders. Phlebotomy procedure started at 1445 and ended at 1450. 100 cc removed. Patient tolerated procedure well. IV needle removed intact.  Patient tolerated phlebotomy with no complaints voiced.  Peripheral IV site intact with no bruising or swelling noted.  Denied SOB, chest pain, or dizziness.  Gauze with coban applied.  Discharged with VSS and no s/s of distress noted.

## 2023-04-20 NOTE — Patient Instructions (Signed)

## 2023-04-21 ENCOUNTER — Encounter: Payer: Self-pay | Admitting: Cardiology

## 2023-04-21 ENCOUNTER — Ambulatory Visit: Payer: Medicare HMO | Attending: Cardiology | Admitting: Cardiology

## 2023-04-21 VITALS — BP 148/82 | HR 72 | Ht 63.0 in | Wt 186.8 lb

## 2023-04-21 DIAGNOSIS — Z789 Other specified health status: Secondary | ICD-10-CM

## 2023-04-21 DIAGNOSIS — N1832 Chronic kidney disease, stage 3b: Secondary | ICD-10-CM

## 2023-04-21 DIAGNOSIS — I25119 Atherosclerotic heart disease of native coronary artery with unspecified angina pectoris: Secondary | ICD-10-CM

## 2023-04-21 DIAGNOSIS — I10 Essential (primary) hypertension: Secondary | ICD-10-CM | POA: Diagnosis not present

## 2023-04-21 MED ORDER — CARVEDILOL 12.5 MG PO TABS: 12.5000 mg | ORAL_TABLET | Freq: Two times a day (BID) | ORAL | Status: DC

## 2023-04-21 NOTE — Progress Notes (Signed)
    Cardiology Office Note  Date: 04/21/2023   ID: Evamaria, Detore 04/25/1945, MRN [id number]  History of Present Illness: [name] [name] is a 78 y.o. female last seen in September 2024 by Ms. Philis Nettle NP, I reviewed the note.  Our last visit was in 2023.  She is here for a routine visit, does not report any definite angina or increasing dyspnea on exertion.  Has had recent URI symptoms.  She states that she tracks her blood pressure at home and reports that it fluctuates.  I rechecked her blood pressure today in the right arm at 148/82 after presenting significantly more hypertensive.  As documented in the chart, she has had prior workup for secondary causes and has a number of medication intolerances including hydralazine, lisinopril, and amlodipine.  Current medications include chlorthalidone 25 mg daily, Aldactone 25 mg daily, and Coreg 12.5 mg twice daily.  I did talk with her about the possibility of considering a trial of clonidine.  I reviewed her most recent lab work.   Physical Exam: VS:  BP (!) 148/82   Pulse 72   Ht 5\' 3"  (1.6 m)   Wt 186 lb 12.8 oz (84.7 kg)   SpO2 95%   BMI 33.09 kg/m , BMI Body mass index is 33.09 kg/m.  Wt Readings from Last 3 Encounters:  04/21/23 186 lb 12.8 oz (84.7 kg)  04/18/23 185 lb 3 oz (84 kg)  01/05/23 184 lb 9.6 oz (83.7 kg)    General: Patient appears comfortable at rest. HEENT: Conjunctiva and lids normal. Neck: Supple, no elevated JVP or carotid bruits. Lungs: Clear to auscultation, nonlabored breathing at rest. Cardiac: Regular rate and rhythm, no S3, 1/6 systolic murmur, no pericardial rub. Extremities: No pitting edema.  ECG:  An ECG dated 10/15/2022 was personally reviewed today and demonstrated:  Sinus rhythm with poor R wave progression, nonspecific ST-T changes.  Labwork: 04/18/2023: ALT 20; AST 27; BUN 19; Creatinine, Ser 1.35; Hemoglobin 13.5; Platelets 324; Potassium 3.3; Sodium 139   Other Studies Reviewed  Today:  No interval cardiac testing for review today.  Assessment and Plan:  1.  CAD with history of NSTEMI in 2015 secondary to occluded distal circumflex as well as mid to distal OM1 stenosis that was managed medically.  LVEF 55 to 60% by echocardiogram in December 2023.  She does not report any angina.  She declines use of aspirin.  2.  Primary hypertension.  Difficult to control and complicated by a number of medication intolerances.  We did discuss her diet today.  Plan to continue chlorthalidone 25 mg daily, Aldactone 25 mg daily, and Coreg 12.5 mg twice daily (reinforced twice daily use).  Trial of clonidine next would be reasonable.  She has been hesitant to use other medications however.  3.  Mixed hyperlipidemia.  LDL 106 in December 2023.  She declines medical therapy for treatment.  4.  CKD stage IIIb.  Creatinine 1.35 with GFR 40.  Disposition:  Follow up  6 months.  Signed, Jonelle Sidle, M.D., F.A.C.C. [facility] at [facility]

## 2023-04-21 NOTE — Patient Instructions (Signed)
 Medication Instructions:  Continue all current medications.   Labwork: none  Testing/Procedures: none  Follow-Up: 6 months   Any Other Special Instructions Will Be Listed Below (If Applicable).   If you need a refill on your cardiac medications before your next appointment, please call your pharmacy.

## 2023-04-25 ENCOUNTER — Inpatient Hospital Stay

## 2023-04-25 DIAGNOSIS — N1832 Chronic kidney disease, stage 3b: Secondary | ICD-10-CM | POA: Diagnosis not present

## 2023-04-25 DIAGNOSIS — D45 Polycythemia vera: Secondary | ICD-10-CM | POA: Diagnosis not present

## 2023-04-25 DIAGNOSIS — I129 Hypertensive chronic kidney disease with stage 1 through stage 4 chronic kidney disease, or unspecified chronic kidney disease: Secondary | ICD-10-CM | POA: Diagnosis not present

## 2023-04-25 DIAGNOSIS — R7402 Elevation of levels of lactic acid dehydrogenase (LDH): Secondary | ICD-10-CM | POA: Diagnosis not present

## 2023-04-25 DIAGNOSIS — Z800 Family history of malignant neoplasm of digestive organs: Secondary | ICD-10-CM | POA: Diagnosis not present

## 2023-04-25 NOTE — Patient Instructions (Signed)
 [facility] [place] - A DEPT OF [facility]  Discharge Instructions: Thank you for choosing [facility] to provide your oncology and hematology care.  If you have a lab appointment with the Cancer Center - please note that after April 8th, 2024, all labs will be drawn in the cancer center.  You do not have to check in or register with the main entrance as you have in the past but will complete your check-in in the cancer center.  Wear comfortable clothing and clothing appropriate for easy access to any Portacath or PICC line.   We strive to give you quality time with your provider. You may need to reschedule your appointment if you arrive late (15 or more minutes).  Arriving late affects you and other patients whose appointments are after yours.  Also, if you miss three or more appointments without notifying the office, you may be dismissed from the clinic at the provider's discretion.      For prescription refill requests, have your pharmacy contact our office and allow 72 hours for refills to be completed.    Today you received the following chemotherapy and/or immunotherapy agents phlebotomy. Therapeutic Phlebotomy, Care After The following information offers guidance on how to care for yourself after your procedure. Your health care provider may also give you more specific instructions. If you have problems or questions, contact your health care provider. What can I expect after the procedure? After therapeutic phlebotomy, it is common to have: Light-headedness or dizziness. You may feel faint. Nausea. Tiredness (fatigue). Follow these instructions at home: Eating and drinking Be sure to eat well-balanced meals for the next 24 hours. Drink enough fluid to keep your urine pale yellow. Avoid drinking alcohol on the day that you had the procedure. Activity  Return to your normal activities as told by your health care provider. Most people can go back  to their normal activities right away. Avoid activities that take a lot of effort for about 5 hours after the procedure. Athletes should avoid strenuous exercise for at least 12 hours. Avoid heavy lifting or pulling for about 5 hours after the procedure. Do not lift anything that is heavier than 10 lb (4.5 kg). Change positions slowly for the remainder of the day, like from sitting to standing. This can help prevent light-headedness or fainting. If you feel light-headed, lie down until the feeling goes away. Needle insertion site care  Keep your bandage (dressing) dry. You can remove the bandage after about 5 hours or as told by your health care provider. If you have bleeding from the needle insertion site, raise (elevate) your arm and press firmly on the site until the bleeding stops. If you have bruising at the site, apply ice to the area. To do this: Put ice in a plastic bag. Place a towel between your skin and the bag. Leave the ice on for 20 minutes, 2-3 times a day for the first 24 hours. Remove the ice if your skin turns bright red so you do not damage the area. If the swelling does not go away after 24 hours, apply a warm, moist cloth (warm compress) to the area for 20 minutes, 2-3 times a day. General instructions Do not use any products that contain nicotine or tobacco, like cigarettes, chewing tobacco, and vaping devices, such as e-cigarettes, for at least 30 minutes after the procedure. If you need help quitting, ask your health care provider. Keep all follow-up visits.  You may need to continue having regular blood tests and therapeutic phlebotomy treatments as directed. Contact a health care provider if: You have redness, swelling, or pain at the needle insertion site. Fluid or blood is coming from the needle insertion site. Pus or a bad smell is coming from the needle insertion site. The needle insertion site feels warm to the touch. You feel light-headed, dizzy, or nauseous, and  the feeling does not go away. You have new bruising at the needle insertion site. You feel weaker than normal. You have a fever or chills. Get help right away if: You have chest pain. You have trouble breathing. You have severe nausea or vomiting. Summary After the procedure, it is common to have some light-headedness, dizziness, nausea, or tiredness (fatigue). Be sure to eat well-balanced meals for the next 24 hours. Drink enough fluid to keep your urine pale yellow. Return to your normal activities as told by your health care provider. Keep all follow-up visits. You may need to continue having regular blood tests and therapeutic phlebotomy treatments as directed. This information is not intended to replace advice given to you by your health care provider. Make sure you discuss any questions you have with your health care provider. Document Revised: 07/13/2022 Document Reviewed: 07/23/2020 Elsevier Patient Education  2024 Elsevier Inc.      To help prevent nausea and vomiting after your treatment, we encourage you to take your nausea medication as directed.  BELOW ARE SYMPTOMS THAT SHOULD BE REPORTED IMMEDIATELY: *FEVER GREATER THAN 100.4 F (38 C) OR HIGHER *CHILLS OR SWEATING *NAUSEA AND VOMITING THAT IS NOT CONTROLLED WITH YOUR NAUSEA MEDICATION *UNUSUAL SHORTNESS OF BREATH *UNUSUAL BRUISING OR BLEEDING *URINARY PROBLEMS (pain or burning when urinating, or frequent urination) *BOWEL PROBLEMS (unusual diarrhea, constipation, pain near the anus) TENDERNESS IN MOUTH AND THROAT WITH OR WITHOUT PRESENCE OF ULCERS (sore throat, sores in mouth, or a toothache) UNUSUAL RASH, SWELLING OR PAIN  UNUSUAL VAGINAL DISCHARGE OR ITCHING   Items with * indicate a potential emergency and should be followed up as soon as possible or go to the Emergency Department if any problems should occur.  Please show the CHEMOTHERAPY ALERT CARD or IMMUNOTHERAPY ALERT CARD at check-in to the Emergency  Department and triage nurse.  Should you have questions after your visit or need to cancel or reschedule your appointment, please contact [facility] [place] - A DEPT OF [facility] [phone]  and follow the prompts.  Office hours are 8:00 a.m. to 4:30 p.m. Monday - Friday. Please note that voicemails left after 4:00 p.m. may not be returned until the following business day.  We are closed weekends and major holidays. You have access to a nurse at all times for urgent questions. Please call the main number to the clinic [phone] and follow the prompts.  For any non-urgent questions, you may also contact your provider using MyChart. We now offer e-Visits for anyone 28 and older to request care online for non-urgent symptoms. For details visit [email or web address].   Also download the MyChart app! Go to the app store, search "MyChart", open the app, select [place], and log in with your MyChart username and password.

## 2023-04-25 NOTE — Progress Notes (Signed)
 [name] [name] presents for therapeutic phlebotomy per MD orders. Last HGB 13.5 / HCT 46.1 on 04-18-2023 . Vital signs stable prior to procedure. Procedure started at 14:24 pm and ended at 14:45 pm. 324 mls of blood removed. Patient denies any dizziness , lightheadedness, or feeling faint.  Gauze and coban applied to site. Vital signs stable at completion of procedure. Patient has no complaints at this time. Alert and oriented x 3. Discharged in stable condition.

## 2023-06-03 ENCOUNTER — Inpatient Hospital Stay

## 2023-06-08 ENCOUNTER — Inpatient Hospital Stay: Attending: Hematology

## 2023-06-08 ENCOUNTER — Inpatient Hospital Stay

## 2023-06-08 DIAGNOSIS — D45 Polycythemia vera: Secondary | ICD-10-CM | POA: Diagnosis not present

## 2023-06-08 LAB — CBC
HCT: 46.3 % — ABNORMAL HIGH (ref 36.0–46.0)
Hemoglobin: 13.7 g/dL (ref 12.0–15.0)
MCH: 22.4 pg — ABNORMAL LOW (ref 26.0–34.0)
MCHC: 29.6 g/dL — ABNORMAL LOW (ref 30.0–36.0)
MCV: 75.7 fL — ABNORMAL LOW (ref 80.0–100.0)
Platelets: 419 10*3/uL — ABNORMAL HIGH (ref 150–400)
RBC: 6.12 MIL/uL — ABNORMAL HIGH (ref 3.87–5.11)
RDW: 19 % — ABNORMAL HIGH (ref 11.5–15.5)
WBC: 6.9 10*3/uL (ref 4.0–10.5)
nRBC: 0 % (ref 0.0–0.2)

## 2023-06-08 NOTE — Progress Notes (Signed)
 Patient presents today for phlebotomy. HCT 46.3 and HGB 13.7. New parameters followed per appointment notes. Proceed with phlebotomy as long as HCT > 43.[name] [name]  presents for therapeutic phlebotomy per MD orders. Last HGB 13.7 / HCT 46.3 on 06-08-2023 . Vital signs stable prior to procedure. Procedure started at 14:07 pm and ended at 14:08 pm. 500 mls of blood removed. Patient denies any dizziness , lightheadedness, or feeling faint.  Gauze and coban applied to site. Vital signs stable at completion of procedure. Patient has no complaints at this time. Alert and oriented x 3. Discharged in stable condition.

## 2023-06-08 NOTE — Patient Instructions (Signed)
 [facility] [place] - A DEPT OF [facility]  Discharge Instructions: Thank you for choosing [facility] to provide your oncology and hematology care.  If you have a lab appointment with the Cancer Center - please note that after April 8th, 2024, all labs will be drawn in the cancer center.  You do not have to check in or register with the main entrance as you have in the past but will complete your check-in in the cancer center.  Wear comfortable clothing and clothing appropriate for easy access to any Portacath or PICC line.   We strive to give you quality time with your provider. You may need to reschedule your appointment if you arrive late (15 or more minutes).  Arriving late affects you and other patients whose appointments are after yours.  Also, if you miss three or more appointments without notifying the office, you may be dismissed from the clinic at the provider's discretion.      For prescription refill requests, have your pharmacy contact our office and allow 72 hours for refills to be completed.    Today you received the following chemotherapy and/or immunotherapy agents phlebotomy. Therapeutic Phlebotomy, Care After The following information offers guidance on how to care for yourself after your procedure. Your health care provider may also give you more specific instructions. If you have problems or questions, contact your health care provider. What can I expect after the procedure? After therapeutic phlebotomy, it is common to have: Light-headedness or dizziness. You may feel faint. Nausea. Tiredness (fatigue). Follow these instructions at home: Eating and drinking Be sure to eat well-balanced meals for the next 24 hours. Drink enough fluid to keep your urine pale yellow. Avoid drinking alcohol on the day that you had the procedure. Activity  Return to your normal activities as told by your health care provider. Most people can go back  to their normal activities right away. Avoid activities that take a lot of effort for about 5 hours after the procedure. Athletes should avoid strenuous exercise for at least 12 hours. Avoid heavy lifting or pulling for about 5 hours after the procedure. Do not lift anything that is heavier than 10 lb (4.5 kg). Change positions slowly for the remainder of the day, like from sitting to standing. This can help prevent light-headedness or fainting. If you feel light-headed, lie down until the feeling goes away. Needle insertion site care  Keep your bandage (dressing) dry. You can remove the bandage after about 5 hours or as told by your health care provider. If you have bleeding from the needle insertion site, raise (elevate) your arm and press firmly on the site until the bleeding stops. If you have bruising at the site, apply ice to the area. To do this: Put ice in a plastic bag. Place a towel between your skin and the bag. Leave the ice on for 20 minutes, 2-3 times a day for the first 24 hours. Remove the ice if your skin turns bright red so you do not damage the area. If the swelling does not go away after 24 hours, apply a warm, moist cloth (warm compress) to the area for 20 minutes, 2-3 times a day. General instructions Do not use any products that contain nicotine or tobacco, like cigarettes, chewing tobacco, and vaping devices, such as e-cigarettes, for at least 30 minutes after the procedure. If you need help quitting, ask your health care provider. Keep all follow-up visits.  You may need to continue having regular blood tests and therapeutic phlebotomy treatments as directed. Contact a health care provider if: You have redness, swelling, or pain at the needle insertion site. Fluid or blood is coming from the needle insertion site. Pus or a bad smell is coming from the needle insertion site. The needle insertion site feels warm to the touch. You feel light-headed, dizzy, or nauseous, and  the feeling does not go away. You have new bruising at the needle insertion site. You feel weaker than normal. You have a fever or chills. Get help right away if: You have chest pain. You have trouble breathing. You have severe nausea or vomiting. Summary After the procedure, it is common to have some light-headedness, dizziness, nausea, or tiredness (fatigue). Be sure to eat well-balanced meals for the next 24 hours. Drink enough fluid to keep your urine pale yellow. Return to your normal activities as told by your health care provider. Keep all follow-up visits. You may need to continue having regular blood tests and therapeutic phlebotomy treatments as directed. This information is not intended to replace advice given to you by your health care provider. Make sure you discuss any questions you have with your health care provider. Document Revised: 07/13/2022 Document Reviewed: 07/23/2020 Elsevier Patient Education  2024 Elsevier Inc.      To help prevent nausea and vomiting after your treatment, we encourage you to take your nausea medication as directed.  BELOW ARE SYMPTOMS THAT SHOULD BE REPORTED IMMEDIATELY: *FEVER GREATER THAN 100.4 F (38 C) OR HIGHER *CHILLS OR SWEATING *NAUSEA AND VOMITING THAT IS NOT CONTROLLED WITH YOUR NAUSEA MEDICATION *UNUSUAL SHORTNESS OF BREATH *UNUSUAL BRUISING OR BLEEDING *URINARY PROBLEMS (pain or burning when urinating, or frequent urination) *BOWEL PROBLEMS (unusual diarrhea, constipation, pain near the anus) TENDERNESS IN MOUTH AND THROAT WITH OR WITHOUT PRESENCE OF ULCERS (sore throat, sores in mouth, or a toothache) UNUSUAL RASH, SWELLING OR PAIN  UNUSUAL VAGINAL DISCHARGE OR ITCHING   Items with * indicate a potential emergency and should be followed up as soon as possible or go to the Emergency Department if any problems should occur.  Please show the CHEMOTHERAPY ALERT CARD or IMMUNOTHERAPY ALERT CARD at check-in to the Emergency  Department and triage nurse.  Should you have questions after your visit or need to cancel or reschedule your appointment, please contact [facility] [place] - A DEPT OF [facility] [phone]  and follow the prompts.  Office hours are 8:00 a.m. to 4:30 p.m. Monday - Friday. Please note that voicemails left after 4:00 p.m. may not be returned until the following business day.  We are closed weekends and major holidays. You have access to a nurse at all times for urgent questions. Please call the main number to the clinic [phone] and follow the prompts.  For any non-urgent questions, you may also contact your provider using MyChart. We now offer e-Visits for anyone 28 and older to request care online for non-urgent symptoms. For details visit [email or web address].   Also download the MyChart app! Go to the app store, search "MyChart", open the app, select [place], and log in with your MyChart username and password.

## 2023-06-14 ENCOUNTER — Ambulatory Visit: Payer: Self-pay | Admitting: Internal Medicine

## 2023-07-16 NOTE — Progress Notes (Unsigned)
 [facility] [address], [place] [address]   CLINIC:  Medical Oncology/Hematology  PCP:  Tobi Fortes, MD [address] [place] [address] [phone]   REASON FOR VISIT:  Follow-up for JAK2 polycythemia vera   PRIOR THERAPY: None   CURRENT THERAPY: Hydrea  500 mg M/F (patient not taking as prescribed***) + aspirin  81 mg + phlebotomy  INTERVAL HISTORY:   [name] [name] 78 y.o. female returns for routine follow-up of JAK2 polycythemia vera.  She was last seen by Sheril Dines PA-C on 04/18/2023.  She had phlebotomy on 04/25/2023 and 06/08/2023.   At today's visit, she reports feeling ***. ***She is prescribed Hydrea  500 mg on Mondays and Fridays, but has not been taking her Hydrea  for the past few weeks.   ***She has declined to take aspirin  after "doing her own research."   ***She denies any skin sores, mouth sores, fatigue, or GI upset.  ***She reports good energy, and stays active doing cardio and weightlifting throughout the week.    Overall, she feels better after starting phlebotomy, and reports decreased headaches and improved tinnitus.*** ***She has also been following with her PCP (Dr. Kermit Ped) for management of hypertension.   ***She denies any aquagenic pruritus, erythromelalgia, or B symptoms.  She has 100***% energy and 100***% appetite. She endorses that she is maintaining a stable weight.   ASSESSMENT & PLAN:  1.  JAK2 positive polycythemia vera - Seen at request of Dr. Carrolyn Clan for elevated hemoglobin and platelet count (CBC from 11/20/2021 showed Hgb 16.9/hematocrit 51.1, platelets 613) - Review of EMR shows platelet count elevated since August 2015,  Hemoglobin elevated since January 2019 - No history of DVT, PE, MI, or CVA. - Hematology work-up (11/20/2021):  JAK2 V617F positive.  Low erythropoietin  1.8 - Hydrea  started on 01/14/2022.  Unable to tolerate dosage >1000 mg daily due to side effects.  ***Currently prescribed  Hydrea  500 mg MWF, but only taking on Monday and Friday.*** - Aspirin  81 mg recommended with extensive discussion regarding medical indication for antiplatelet agents.  Patient states that she "did her own research and knows that it could give her a heart attack," so she does not take it - She has required intermittent phlebotomy for control of hematocrit, which she is tolerating well.  Last phlebotomy was on 06/08/2023.*** - Labs today (***): Hgb ***/RBC ***/hematocrit ***%, platelets ***.  Normal WBC/differential***.  Minimally elevated LDH ***. Baseline CKD stage IIIa/b.*** Normal LFTs. - She is feeling better (decreased headaches and tinnitus, improved blood pressure) after starting phlebotomy, but reports worsening headaches when hematocrit trends upward.*** - Extensive discussion regarding diagnosis of polycythemia vera, including risk of thrombosis (CVA, MI, DVT, PE) as well as risk of myelofibrosis and leukemia in some patients. - Based on her age, she is considered high risk and therefore recommended cytoreductive therapy with hydroxyurea .  Due to side effects from increased dose of hydroxyurea , we will use combination of Hydrea  and phlebotomy to achieve target blood counts. Patient was inquiring about treatment with PTG-300 ([place]), which is currently only available in clinical trials.  Discussed with patient that clinical trials are available in  , and if she would like to pursue this we can place referral. - PLAN: *** TBD - need to increase Hydrea  or ensure complaince??? Blood counts not at goal.  *** Since patient has not been taking Hydrea  as prescribed, we had discussion regarding importance of compliance.  Patient agrees to continue Hydrea  500 mg Monday  and Fridays, with strict instructions to notify our office prior to making any dose changes of her own volition. - Recommend CBC + possible phlebotomy every 6 weeks (recommend phlebotomy if HCT >43.0, to see if more  aggressive phlebotomy will help with headaches and blood pressure) - Recommended aspirin  81 mg daily (patient refuses) - Labs in 3 months = CBC/D, CMP, LDH  - OFFICE in 3 months (same-day labs + possible phlebotomy)    2.  Blood pressure*** - Patient has had extreme hypertension documented on several office visits. - BP today (04/18/2023) is 169/92 - No neurologic deficits, acute chest pain, or dyspnea - Home BP has been "up and down depending on what she is doing," with SBP ranging from 120-180 - PLAN: Advised to discuss hypertension management with PCP and nephrologist.  She has had ongoing difficult to treat hypertension, being managed by other providers. - Educated on alarm symptoms that would prompt immediate medical evaluation in emergency department   3.  Social/family history: - Lives at home with her family.  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  She was never smoker. - No family history of leukemias or malignancies.  PLAN SUMMARY:***TBD *** >> CBC + possible therapeutic phlebotomy every 6 weeks (if HCT > 43.0) >> Labs in 3 months = CBC/D, CMP, LDH >> OFFICE visit in 3 months (same day as labs + possible phlebotomy)     REVIEW OF SYSTEMS:***  Review of Systems  Constitutional:  Negative for appetite change, chills, diaphoresis, fatigue, fever and unexpected weight change.  HENT:   Negative for lump/mass and nosebleeds.   Eyes:  Negative for eye problems.  Respiratory:  Positive for cough. Negative for hemoptysis and shortness of breath.   Cardiovascular:  Positive for palpitations. Negative for chest pain and leg swelling.  Gastrointestinal:  Negative for abdominal pain, blood in stool, constipation, diarrhea, nausea and vomiting.  Genitourinary:  Negative for hematuria.   Skin: Negative.   Neurological:  Positive for headaches. Negative for dizziness and light-headedness.  Hematological:  Does not  bruise/bleed easily.     PHYSICAL EXAM:***  ECOG PERFORMANCE STATUS: 1 - Symptomatic but completely ambulatory  There were no vitals filed for this visit.  There were no vitals filed for this visit.  Physical Exam Constitutional:      Appearance: Normal appearance. She is obese.  Cardiovascular:     Heart sounds: Normal heart sounds.  Pulmonary:     Breath sounds: Normal breath sounds.  Neurological:     General: No focal deficit present.     Mental Status: Mental status is at baseline.  Psychiatric:        Behavior: Behavior normal. Behavior is cooperative.    PAST MEDICAL/SURGICAL HISTORY:  Past Medical History:  Diagnosis Date   Anxiety    Dysphagia 09/03/2020   Essential hypertension    HLD (hyperlipidemia)    Hx of statin intolerance   Hypothyroidism    NSTEMI (non-ST elevated myocardial infarction) (HCC) 2015   a.  Related to occlusion of the distal Cx. The mid to distal OM1 also contains a significant stenosis that is best treated with medical therapy. Patent RCA/LAD with tortuosity. LM patent. EF 55%.    Polycythemia vera (HCC)    Stage 3b chronic kidney disease    Past Surgical History:  Procedure Laterality Date   ABDOMINAL HYSTERECTOMY     CARDIAC CATHETERIZATION  ~ 2000   CATARACT EXTRACTION  W/PHACO Right 12/24/2022   Procedure: CATARACT EXTRACTION PHACO AND INTRAOCULAR LENS PLACEMENT (IOC);  Surgeon: Tarri Farm, MD;  Location: [facility];  Service: Ophthalmology;  Laterality: Right;  CDE - 15.11   CORONARY ANGIOPLASTY  09/20/2013   LEFT HEART CATHETERIZATION WITH CORONARY ANGIOGRAM N/A 09/20/2013   Procedure: LEFT HEART CATHETERIZATION WITH CORONARY ANGIOGRAM;  Surgeon: Mickiel Albany, MD;  Location: [facility] CATH LAB;  Service: Cardiovascular;  Laterality: N/A;   LEFT HEART CATHETERIZATION WITH CORONARY ANGIOGRAM N/A 09/27/2013   Procedure: LEFT HEART CATHETERIZATION WITH CORONARY ANGIOGRAM;  Surgeon: Arlander Bellman, MD;  Location: [facility] CATH LAB;  Service:  Cardiovascular;  Laterality: N/A;   THYROIDECTOMY  10/08/2011   Procedure: THYROIDECTOMY;  Surgeon: Beau Bound, MD;  Location: [facility];  Service: General;  Laterality: N/A;  Total   VAGINAL HYSTERECTOMY  1993    SOCIAL HISTORY:  Social History   Socioeconomic History   Marital status: Widowed    Spouse name: Not on file   Number of children: Not on file   Years of education: Not on file   Highest education level: Not on file  Occupational History   Not on file  Tobacco Use   Smoking status: Never   Smokeless tobacco: Never  Vaping Use   Vaping status: Never Used  Substance and Sexual Activity   Alcohol  use: No    Alcohol /week: 0.0 standard drinks of alcohol    Drug use: No   Sexual activity: Never  Other Topics Concern   Not on file  Social History Narrative   ** Merged History Encounter **  Lives at home with family.  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      Social Drivers of Corporate investment banker Strain: Low Risk  (07/21/2020)   Overall Financial Resource Strain (CARDIA)    Difficulty of Paying Living Expenses: Not very hard  Food Insecurity: No Food Insecurity (07/21/2020)   Hunger Vital Sign    Worried About Running Out of Food in the Last Year: Never true    Ran Out of Food in the Last Year: Never true  Transportation Needs: No Transportation Needs (09/08/2020)   PRAPARE - Administrator, Civil Service (Medical): No    Lack of Transportation (Non-Medical): No  Physical Activity: Sufficiently Active (03/11/2020)   Exercise Vital Sign    Days of Exercise per Week: 5 days    Minutes of Exercise per Session: 30 min  Stress: No Stress Concern Present (03/11/2020)   [employer] of Occupational Health - Occupational Stress Questionnaire    Feeling of Stress : Not at all  Social Connections: Not on file  Intimate Partner Violence: Not on file    FAMILY HISTORY:  Family History  Problem Relation Age of Onset   Colon cancer Father    Thyroid  disease Neg  Hx     CURRENT MEDICATIONS:  Outpatient Encounter Medications as of 07/18/2023  Medication Sig   Blood Pressure Monitoring KIT 1 each by Does not apply route as directed. Dx: resistant hypertension   carvedilol  (COREG ) 12.5 MG tablet Take 1 tablet (12.5 mg total) by mouth 2 (two) times daily.   chlorthalidone  (HYGROTON ) 25 MG tablet Take 1 tablet (25 mg total) by mouth daily.   hydroxyurea  (HYDREA ) 500 MG capsule Take 1 capsule (500 mg total) by mouth 2 (two) times a week. Take on Monday and Friday.  May take with food to minimize GI side effects.   spironolactone  (ALDACTONE ) 25 MG tablet  Take 1 tablet (25 mg total) by mouth daily.   No facility-administered encounter medications on file as of 07/18/2023.    ALLERGIES:  Allergies  Allergen Reactions   Fluviral [Influenza Virus Vaccine] Shortness Of Breath, Swelling and Other (See Comments)    Including back pain that radiates in upper extremeties   Haemophilus Influenzae Other (See Comments), Shortness Of Breath and Swelling    Including back pain that radiates in upper extremeties   Hydralazine  Shortness Of Breath   Amlodipine      Lip swelling per patient.    Lisinopril     Dr. Eldon Greenland stopped due to side effects per patient.  Could remember what side effects were.     Ranexa  [Ranolazine ]     Chest pain    LABORATORY DATA:  I have reviewed the labs as listed.  CBC    Component Value Date/Time   WBC 6.9 06/08/2023 1304   RBC 6.12 (H) 06/08/2023 1304   HGB 13.7 06/08/2023 1304   HCT 46.3 (H) 06/08/2023 1304   PLT 419 (H) 06/08/2023 1304   MCV 75.7 (L) 06/08/2023 1304   MCH 22.4 (L) 06/08/2023 1304   MCHC 29.6 (L) 06/08/2023 1304   RDW 19.0 (H) 06/08/2023 1304   LYMPHSABS 1.4 04/18/2023 0924   MONOABS 0.3 04/18/2023 0924   EOSABS 0.4 04/18/2023 0924   BASOSABS 0.0 04/18/2023 0924      Latest Ref Rng & Units 04/18/2023    9:24 AM 02/15/2023    2:05 PM 01/05/2023    1:32 PM  CMP  Glucose 70 - 99 mg/dL 161  096  045   BUN 8  - 23 mg/dL 19  20  16    Creatinine 0.44 - 1.00 mg/dL 4.09  8.11  9.14   Sodium 135 - 145 mmol/L 139  134  137   Potassium 3.5 - 5.1 mmol/L 3.3  3.1  4.0   Chloride 98 - 111 mmol/L 102  101  104   CO2 22 - 32 mmol/L 26  26  25    Calcium  8.9 - 10.3 mg/dL 9.0  8.9    8.8  9.0   Total Protein 6.5 - 8.1 g/dL 7.5   7.5   Total Bilirubin 0.0 - 1.2 mg/dL 1.0   0.9   Alkaline Phos 38 - 126 U/L 54   50   AST 15 - 41 U/L 27   25   ALT 0 - 44 U/L 20   22     DIAGNOSTIC IMAGING:  I have independently reviewed the relevant imaging and discussed with the patient.   WRAP UP:  All questions were answered. The patient knows to call the clinic with any problems, questions or concerns.  Medical decision making: Moderate  Time spent on visit: I spent 20 minutes counseling the patient face to face. The total time spent in the appointment was 30 minutes and more than 50% was on counseling.  Sonnie Dusky, PA-C  ***

## 2023-07-18 ENCOUNTER — Inpatient Hospital Stay: Attending: Hematology

## 2023-07-18 ENCOUNTER — Inpatient Hospital Stay (HOSPITAL_BASED_OUTPATIENT_CLINIC_OR_DEPARTMENT_OTHER): Admitting: Physician Assistant

## 2023-07-18 ENCOUNTER — Inpatient Hospital Stay

## 2023-07-18 DIAGNOSIS — D45 Polycythemia vera: Secondary | ICD-10-CM | POA: Diagnosis not present

## 2023-07-18 DIAGNOSIS — Z800 Family history of malignant neoplasm of digestive organs: Secondary | ICD-10-CM | POA: Diagnosis not present

## 2023-07-18 DIAGNOSIS — N1832 Chronic kidney disease, stage 3b: Secondary | ICD-10-CM | POA: Insufficient documentation

## 2023-07-18 DIAGNOSIS — R7402 Elevation of levels of lactic acid dehydrogenase (LDH): Secondary | ICD-10-CM | POA: Diagnosis not present

## 2023-07-18 LAB — CBC WITH DIFFERENTIAL/PLATELET
Abs Immature Granulocytes: 0.02 10*3/uL (ref 0.00–0.07)
Basophils Absolute: 0 10*3/uL (ref 0.0–0.1)
Basophils Relative: 0 %
Eosinophils Absolute: 0.3 10*3/uL (ref 0.0–0.5)
Eosinophils Relative: 4 %
HCT: 44.2 % (ref 36.0–46.0)
Hemoglobin: 12.7 g/dL (ref 12.0–15.0)
Immature Granulocytes: 0 %
Lymphocytes Relative: 39 %
Lymphs Abs: 2.6 10*3/uL (ref 0.7–4.0)
MCH: 21 pg — ABNORMAL LOW (ref 26.0–34.0)
MCHC: 28.7 g/dL — ABNORMAL LOW (ref 30.0–36.0)
MCV: 72.9 fL — ABNORMAL LOW (ref 80.0–100.0)
Monocytes Absolute: 0.3 10*3/uL (ref 0.1–1.0)
Monocytes Relative: 5 %
Neutro Abs: 3.3 10*3/uL (ref 1.7–7.7)
Neutrophils Relative %: 52 %
Platelets: 421 10*3/uL — ABNORMAL HIGH (ref 150–400)
RBC: 6.06 MIL/uL — ABNORMAL HIGH (ref 3.87–5.11)
RDW: 19.1 % — ABNORMAL HIGH (ref 11.5–15.5)
WBC: 6.6 10*3/uL (ref 4.0–10.5)
nRBC: 0 % (ref 0.0–0.2)

## 2023-07-18 LAB — COMPREHENSIVE METABOLIC PANEL WITH GFR
ALT: 17 U/L (ref 0–44)
AST: 29 U/L (ref 15–41)
Albumin: 3.6 g/dL (ref 3.5–5.0)
Alkaline Phosphatase: 50 U/L (ref 38–126)
Anion gap: 9 (ref 5–15)
BUN: 17 mg/dL (ref 8–23)
CO2: 25 mmol/L (ref 22–32)
Calcium: 8.8 mg/dL — ABNORMAL LOW (ref 8.9–10.3)
Chloride: 102 mmol/L (ref 98–111)
Creatinine, Ser: 1.23 mg/dL — ABNORMAL HIGH (ref 0.44–1.00)
GFR, Estimated: 45 mL/min — ABNORMAL LOW (ref >60)
Glucose, Bld: 99 mg/dL (ref 70–99)
Potassium: 3.4 mmol/L — ABNORMAL LOW (ref 3.5–5.1)
Sodium: 136 mmol/L (ref 135–145)
Total Bilirubin: 0.7 mg/dL (ref 0.0–1.2)
Total Protein: 7.3 g/dL (ref 6.5–8.1)

## 2023-07-18 LAB — LACTATE DEHYDROGENASE: LDH: 223 U/L — ABNORMAL HIGH (ref 98–192)

## 2023-07-18 MED ORDER — HYDROXYUREA 500 MG PO CAPS: 500.0000 mg | ORAL_CAPSULE | ORAL | 1 refills | Status: DC

## 2023-07-18 NOTE — Patient Instructions (Signed)
 [facility] at [facility] **VISIT SUMMARY & IMPORTANT INSTRUCTIONS **   You were seen today by Sheril Dines PA-C for your polycythemia vera.    POLYCYTHEMIA VERA Your platelets and blood counts are higher than where they need to be.  This places you at increased risk of blood clot, heart attack, and stroke. Please restart your Hydrea  three times each week - 1 tablet each day on Monday, Wednesday, and Friday.  It is important to take this consistently so that we know if it is working or not. Hopefully, you will not need phlebotomy as often after you restart your Hydrea .  Additionally, Hydrea  will be able to control your platelets, whereas phlebotomy can only affect your hemoglobin/hematocrit. We will check your blood in 6 weeks to see if you need another phlebotomy.  FOLLOW-UP APPOINTMENT: Office visit in 3 months  ** Thank you for trusting me with your healthcare!  I strive to provide all of my patients with quality care at each visit.  If you receive a survey for this visit, I would be so grateful to you for taking the time to provide feedback.  Thank you in advance!  ~ Teaghan Formica                   Dr. Paulett Boros   &   Sheril Dines, PA-C   - - - - - - - - - - - - - - - - - -    Thank you for choosing [facility] at [facility] to provide your oncology and hematology care.  To afford each patient quality time with our provider, please arrive at least 15 minutes before your scheduled appointment time.   If you have a lab appointment with the Cancer Center please come in thru the Main Entrance and check in at the main information desk.  You need to re-schedule your appointment should you arrive 10 or more minutes late.  We strive to give you quality time with our providers, and arriving late affects you and other patients whose appointments are after yours.  Also, if you no show three or more times for appointments you may be  dismissed from the clinic at the providers discretion.     Again, thank you for choosing [facility].  Our hope is that these requests will decrease the amount of time that you wait before being seen by our physicians.       _____________________________________________________________  Should you have questions after your visit to [facility], please contact our office at [phone] and follow the prompts.  Our office hours are 8:00 a.m. and 4:30 p.m. Monday - Friday.  Please note that voicemails left after 4:00 p.m. may not be returned until the following business day.  We are closed weekends and major holidays.  You do have access to a nurse 24-7, just call the main number to the clinic [phone] and do not press any options, hold on the line and a nurse will answer the phone.    For prescription refill requests, have your pharmacy contact our office and allow 72 hours.

## 2023-07-18 NOTE — Progress Notes (Signed)
 Message received from R. Pennington PA phlebotomy HELD today. HGB 12.7 and HCT 44.2.

## 2023-08-09 ENCOUNTER — Emergency Department (HOSPITAL_COMMUNITY)

## 2023-08-09 ENCOUNTER — Encounter (HOSPITAL_COMMUNITY): Payer: Self-pay

## 2023-08-09 ENCOUNTER — Emergency Department (HOSPITAL_COMMUNITY)
Admission: EM | Admit: 2023-08-09 | Discharge: 2023-08-09 | Disposition: A | Discharge status: Home / Self Care | Attending: Emergency Medicine | Admitting: Emergency Medicine

## 2023-08-09 ENCOUNTER — Other Ambulatory Visit: Payer: Self-pay

## 2023-08-09 DIAGNOSIS — J9811 Atelectasis: Secondary | ICD-10-CM | POA: Diagnosis not present

## 2023-08-09 DIAGNOSIS — Z79899 Other long term (current) drug therapy: Secondary | ICD-10-CM | POA: Diagnosis not present

## 2023-08-09 DIAGNOSIS — I10 Essential (primary) hypertension: Secondary | ICD-10-CM | POA: Insufficient documentation

## 2023-08-09 DIAGNOSIS — R059 Cough, unspecified: Secondary | ICD-10-CM | POA: Diagnosis not present

## 2023-08-09 DIAGNOSIS — R030 Elevated blood-pressure reading, without diagnosis of hypertension: Secondary | ICD-10-CM

## 2023-08-09 DIAGNOSIS — R079 Chest pain, unspecified: Secondary | ICD-10-CM | POA: Diagnosis not present

## 2023-08-09 DIAGNOSIS — R072 Precordial pain: Secondary | ICD-10-CM

## 2023-08-09 DIAGNOSIS — R350 Frequency of micturition: Secondary | ICD-10-CM | POA: Diagnosis not present

## 2023-08-09 DIAGNOSIS — M7989 Other specified soft tissue disorders: Secondary | ICD-10-CM | POA: Diagnosis not present

## 2023-08-09 DIAGNOSIS — R600 Localized edema: Secondary | ICD-10-CM | POA: Diagnosis not present

## 2023-08-09 LAB — CBC WITH DIFFERENTIAL/PLATELET
Abs Immature Granulocytes: 0.03 10*3/uL (ref 0.00–0.07)
Basophils Absolute: 0 10*3/uL (ref 0.0–0.1)
Basophils Relative: 0 %
Eosinophils Absolute: 0.3 10*3/uL (ref 0.0–0.5)
Eosinophils Relative: 5 %
HCT: 41.3 % (ref 36.0–46.0)
Hemoglobin: 12.2 g/dL (ref 12.0–15.0)
Immature Granulocytes: 1 %
Lymphocytes Relative: 23 %
Lymphs Abs: 1.4 10*3/uL (ref 0.7–4.0)
MCH: 21.2 pg — ABNORMAL LOW (ref 26.0–34.0)
MCHC: 29.5 g/dL — ABNORMAL LOW (ref 30.0–36.0)
MCV: 71.7 fL — ABNORMAL LOW (ref 80.0–100.0)
Monocytes Absolute: 0.2 10*3/uL (ref 0.1–1.0)
Monocytes Relative: 4 %
Neutro Abs: 4.1 10*3/uL (ref 1.7–7.7)
Neutrophils Relative %: 67 %
Platelets: 395 10*3/uL (ref 150–400)
RBC: 5.76 MIL/uL — ABNORMAL HIGH (ref 3.87–5.11)
RDW: 19.9 % — ABNORMAL HIGH (ref 11.5–15.5)
WBC: 6 10*3/uL (ref 4.0–10.5)
nRBC: 0 % (ref 0.0–0.2)

## 2023-08-09 LAB — COMPREHENSIVE METABOLIC PANEL WITH GFR
ALT: 14 U/L (ref 0–44)
AST: 22 U/L (ref 15–41)
Albumin: 3.3 g/dL — ABNORMAL LOW (ref 3.5–5.0)
Alkaline Phosphatase: 50 U/L (ref 38–126)
Anion gap: 12 (ref 5–15)
BUN: 15 mg/dL (ref 8–23)
CO2: 23 mmol/L (ref 22–32)
Calcium: 8.9 mg/dL (ref 8.9–10.3)
Chloride: 105 mmol/L (ref 98–111)
Creatinine, Ser: 1.18 mg/dL — ABNORMAL HIGH (ref 0.44–1.00)
GFR, Estimated: 48 mL/min — ABNORMAL LOW (ref >60)
Glucose, Bld: 134 mg/dL — ABNORMAL HIGH (ref 70–99)
Potassium: 3.6 mmol/L (ref 3.5–5.1)
Sodium: 140 mmol/L (ref 135–145)
Total Bilirubin: 0.7 mg/dL (ref 0.0–1.2)
Total Protein: 7 g/dL (ref 6.5–8.1)

## 2023-08-09 LAB — TROPONIN I (HIGH SENSITIVITY)
Troponin I (High Sensitivity): 12 ng/L (ref <18)
Troponin I (High Sensitivity): 15 ng/L (ref <18)

## 2023-08-09 LAB — BRAIN NATRIURETIC PEPTIDE: B Natriuretic Peptide: 104 pg/mL — ABNORMAL HIGH (ref 0.0–100.0)

## 2023-08-09 LAB — URINALYSIS, ROUTINE W REFLEX MICROSCOPIC
Bilirubin Urine: NEGATIVE
Glucose, UA: NEGATIVE mg/dL
Hgb urine dipstick: NEGATIVE
Ketones, ur: NEGATIVE mg/dL
Leukocytes,Ua: NEGATIVE
Nitrite: NEGATIVE
Protein, ur: NEGATIVE mg/dL
Specific Gravity, Urine: 1.006 (ref 1.005–1.030)
pH: 5 (ref 5.0–8.0)

## 2023-08-09 MED ORDER — TRAMADOL HCL 50 MG PO TABS
50.0000 mg | ORAL_TABLET | Freq: Once | ORAL | Status: AC
  Administered 2023-08-09: 50 mg via ORAL
  Filled 2023-08-09: qty 1

## 2023-08-09 MED ORDER — ALUM & MAG HYDROXIDE-SIMETH 200-200-20 MG/5ML PO SUSP
30.0000 mL | Freq: Once | ORAL | Status: AC
  Administered 2023-08-09: 30 mL via ORAL
  Filled 2023-08-09: qty 30

## 2023-08-09 MED ORDER — LACTATED RINGERS IV BOLUS
1000.0000 mL | Freq: Once | INTRAVENOUS | Status: AC
  Administered 2023-08-09: 1000 mL via INTRAVENOUS

## 2023-08-09 MED ORDER — ACETAMINOPHEN 500 MG PO TABS
1000.0000 mg | ORAL_TABLET | Freq: Once | ORAL | Status: DC
  Filled 2023-08-09: qty 2

## 2023-08-09 MED ORDER — FAMOTIDINE 20 MG PO TABS
20.0000 mg | ORAL_TABLET | Freq: Once | ORAL | Status: AC
  Administered 2023-08-09: 20 mg via ORAL
  Filled 2023-08-09: qty 1

## 2023-08-09 NOTE — ED Notes (Signed)
 Following Triage, Pt also reports experiencing left facial numbness/tingling. Pt denies any other neurological deficit.

## 2023-08-09 NOTE — Discharge Instructions (Signed)
 It was our pleasure to provide your ER care today - we hope that you feel better.  For recent chest pain, follow up closely with cardiologist in the next 1-2 weeks - we made referral, and they should be contacting you with an appointment in the next few days.   Return to ER right away if worse, new symptoms, fevers,new/severe pain,  recurrent/persistent chest pain, increased trouble breathing, weak/faint, numbness/weakness, change in speech or vision, or other concern.

## 2023-08-09 NOTE — ED Triage Notes (Signed)
 Pt arrived via POV c/o chest pain and discomfort that Pt believes may have began yesterday after being outside in the sun yesterday. Pt reports she may also be dehydrated. Pt reports experiencing urinary frequency.

## 2023-08-09 NOTE — ED Provider Notes (Signed)
 [place] EMERGENCY DEPARTMENT AT [facility] Provider Note   CSN: [id number] Arrival date & time: 08/09/23  0840     Patient presents with: Chest Pain   [name] [name] is a 78 y.o. female.   Pt c/o chest discomfort in past 1-2 days, lower sternal, midline, dull/mild, non radiating, not pleuritic, at rest, not exertion, without specific exacerbating or alleviating factors. Indicates was out in sun yesterday/yardwork, not sure if got dehdyrated.  Also c/o recent non prod cough in past few days. No specific known ill contacts. No sore throat, runny nose or other uri symptoms. Denies headache. No fever or chills. No abd pain or nvd. No dysuria, +urinary frequency. No back/flank pain. Pt had noted ?left leg swelling > right (on exam no asymmetric swelling noted).   The history is provided by the patient and medical records.  Chest Pain Associated symptoms: cough   Associated symptoms: no abdominal pain, no back pain, no fever, no headache, no palpitations, no shortness of breath, no vomiting and no weakness        Prior to Admission medications   Medication Sig Start Date End Date Taking? Authorizing Provider  Blood Pressure Monitoring KIT 1 each by Does not apply route as directed. Dx: resistant hypertension 11/16/22   Miriam Norris, NP  carvedilol  (COREG ) 12.5 MG tablet Take 1 tablet (12.5 mg total) by mouth 2 (two) times daily. 04/21/23   Debera Jayson MATSU, MD  chlorthalidone  (HYGROTON ) 25 MG tablet Take 1 tablet (25 mg total) by mouth daily. 08/11/22   Melvenia Manus BRAVO, MD  hydroxyurea  (HYDREA ) 500 MG capsule Take 1 capsule (500 mg total) by mouth every Monday, Wednesday, and Friday. Take on Monday and Friday.  May take with food to minimize GI side effects. 07/18/23   Lamon Pleasant HERO, PA-C  spironolactone  (ALDACTONE ) 25 MG tablet Take 1 tablet (25 mg total) by mouth daily. 08/11/22 04/21/23  Melvenia Manus BRAVO, MD    Allergies: Fluviral [influenza virus vaccine],  Haemophilus influenzae, Hydralazine , Amlodipine , Lisinopril, and Ranexa  [ranolazine ]    Review of Systems  Constitutional:  Negative for chills and fever.  HENT:  Negative for sore throat.   Eyes:  Negative for visual disturbance.  Respiratory:  Positive for cough. Negative for shortness of breath.   Cardiovascular:  Positive for chest pain. Negative for palpitations and leg swelling.  Gastrointestinal:  Negative for abdominal pain, diarrhea and vomiting.  Genitourinary:  Positive for frequency. Negative for dysuria and flank pain.  Musculoskeletal:  Negative for back pain and neck pain.  Skin:  Negative for rash.  Neurological:  Negative for speech difficulty, weakness and headaches.  Psychiatric/Behavioral:  Negative for confusion.     Updated Vital Signs BP (!) 146/83   Pulse 90   Temp 97.8 F (36.6 C) (Oral)   Resp (!) 22   Ht 1.6 m (5' 3)   Wt 87.8 kg   SpO2 94%   BMI 34.29 kg/m   Physical Exam Vitals and nursing note reviewed.  Constitutional:      Appearance: Normal appearance. She is well-developed.  HENT:     Head: Atraumatic.     Nose: Nose normal.     Mouth/Throat:     Mouth: Mucous membranes are moist.   Eyes:     General: No scleral icterus.    Conjunctiva/sclera: Conjunctivae normal.     Pupils: Pupils are equal, round, and reactive to light.   Neck:     Trachea: No tracheal deviation.  Comments: Trachea midline, thyroid  not grossly enlarged or tender. No bruits.  Cardiovascular:     Rate and Rhythm: Normal rate and regular rhythm.     Pulses: Normal pulses.     Heart sounds: Normal heart sounds. No murmur heard.    No friction rub. No gallop.  Pulmonary:     Effort: Pulmonary effort is normal. No respiratory distress.     Breath sounds: Normal breath sounds.  Abdominal:     General: Bowel sounds are normal. There is no distension.     Palpations: Abdomen is soft. There is no mass.     Tenderness: There is no abdominal tenderness. There is no  guarding.  Genitourinary:    Comments: No cva tenderness.   Musculoskeletal:        General: No tenderness.     Cervical back: Normal range of motion and neck supple. No rigidity. No muscular tenderness.     Comments: Mild bilateral lower leg edema, good passive rom bil extremities without pain or focal bony tenderness, distal pulses palp bil. Bilateral extremities of normal color and warmth.    Skin:    General: Skin is warm and dry.     Findings: No rash.   Neurological:     Mental Status: She is alert.     Comments: Alert, speech normal. Motor/sens grossly intact bil. Steady gait.   Psychiatric:        Mood and Affect: Mood normal.     (all labs ordered are listed, but only abnormal results are displayed) Results for orders placed or performed during the hospital encounter of 08/09/23  Comprehensive metabolic panel   Collection Time: 08/09/23  9:12 AM  Result Value Ref Range   Sodium 140 135 - 145 mmol/L   Potassium 3.6 3.5 - 5.1 mmol/L   Chloride 105 98 - 111 mmol/L   CO2 23 22 - 32 mmol/L   Glucose, Bld 134 (H) 70 - 99 mg/dL   BUN 15 8 - 23 mg/dL   Creatinine, Ser 8.81 (H) 0.44 - 1.00 mg/dL   Calcium  8.9 8.9 - 10.3 mg/dL   Total Protein 7.0 6.5 - 8.1 g/dL   Albumin 3.3 (L) 3.5 - 5.0 g/dL   AST 22 15 - 41 U/L   ALT 14 0 - 44 U/L   Alkaline Phosphatase 50 38 - 126 U/L   Total Bilirubin 0.7 0.0 - 1.2 mg/dL   GFR, Estimated 48 (L) >60 mL/min   Anion gap 12 5 - 15  CBC with Differential   Collection Time: 08/09/23  9:12 AM  Result Value Ref Range   WBC 6.0 4.0 - 10.5 K/uL   RBC 5.76 (H) 3.87 - 5.11 MIL/uL   Hemoglobin 12.2 12.0 - 15.0 g/dL   HCT 58.6 63.9 - 53.9 %   MCV 71.7 (L) 80.0 - 100.0 fL   MCH 21.2 (L) 26.0 - 34.0 pg   MCHC 29.5 (L) 30.0 - 36.0 g/dL   RDW 80.0 (H) 88.4 - 84.4 %   Platelets 395 150 - 400 K/uL   nRBC 0.0 0.0 - 0.2 %   Neutrophils Relative % 67 %   Neutro Abs 4.1 1.7 - 7.7 K/uL   Lymphocytes Relative 23 %   Lymphs Abs 1.4 0.7 - 4.0 K/uL    Monocytes Relative 4 %   Monocytes Absolute 0.2 0.1 - 1.0 K/uL   Eosinophils Relative 5 %   Eosinophils Absolute 0.3 0.0 - 0.5 K/uL   Basophils Relative 0 %  Basophils Absolute 0.0 0.0 - 0.1 K/uL   Immature Granulocytes 1 %   Abs Immature Granulocytes 0.03 0.00 - 0.07 K/uL  Troponin I (High Sensitivity)   Collection Time: 08/09/23  9:12 AM  Result Value Ref Range   Troponin I (High Sensitivity) 12 <18 ng/L  Brain natriuretic peptide   Collection Time: 08/09/23  9:30 AM  Result Value Ref Range   B Natriuretic Peptide 104.0 (H) 0.0 - 100.0 pg/mL  Urinalysis, Routine w reflex microscopic -Urine, Clean Catch   Collection Time: 08/09/23 11:03 AM  Result Value Ref Range   Color, Urine STRAW (A) YELLOW   APPearance CLEAR CLEAR   Specific Gravity, Urine 1.006 1.005 - 1.030   pH 5.0 5.0 - 8.0   Glucose, UA NEGATIVE NEGATIVE mg/dL   Hgb urine dipstick NEGATIVE NEGATIVE   Bilirubin Urine NEGATIVE NEGATIVE   Ketones, ur NEGATIVE NEGATIVE mg/dL   Protein, ur NEGATIVE NEGATIVE mg/dL   Nitrite NEGATIVE NEGATIVE   Leukocytes,Ua NEGATIVE NEGATIVE  Troponin I (High Sensitivity)   Collection Time: 08/09/23 11:28 AM  Result Value Ref Range   Troponin I (High Sensitivity) 15 <18 ng/L   US  Venous Img Lower  Left (DVT Study) Result Date: 08/09/2023 CLINICAL DATA:  Left lower extremity swelling. EXAM: LEFT LOWER EXTREMITY VENOUS DOPPLER ULTRASOUND TECHNIQUE: Gray-scale sonography with graded compression, as well as color Doppler and duplex ultrasound were performed to evaluate the lower extremity deep venous systems from the level of the common femoral vein and including the common femoral, femoral, profunda femoral, popliteal and calf veins including the posterior tibial, peroneal and gastrocnemius veins when visible. The superficial great saphenous vein was also interrogated. Spectral Doppler was utilized to evaluate flow at rest and with distal augmentation maneuvers in the common femoral, femoral  and popliteal veins. COMPARISON:  None Available. FINDINGS: Contralateral Common Femoral Vein: Respiratory phasicity is normal and symmetric with the symptomatic side. No evidence of thrombus. Normal compressibility. Common Femoral Vein: No evidence of thrombus. Normal compressibility, respiratory phasicity and response to augmentation. Saphenofemoral Junction: No evidence of thrombus. Normal compressibility and flow on color Doppler imaging. Profunda Femoral Vein: No evidence of thrombus. Normal compressibility and flow on color Doppler imaging. Femoral Vein: No evidence of thrombus. Normal compressibility, respiratory phasicity and response to augmentation. Popliteal Vein: No evidence of thrombus. Normal compressibility, respiratory phasicity and response to augmentation. Calf Veins: No evidence of thrombus. Normal compressibility and flow on color Doppler imaging. Other Findings:  None. IMPRESSION: No evidence of deep venous thrombosis. Electronically Signed   By: Camellia Candle M.D.   On: 08/09/2023 10:50   DG Chest 2 View Result Date: 08/09/2023 CLINICAL DATA:  Chest pain. EXAM: CHEST - 2 VIEW COMPARISON:  06/16/2022 FINDINGS: Lungs are hyperexpanded. The cardio pericardial silhouette is enlarged. Streaky density in the left base suggest atelectasis. No focal consolidation or overt pulmonary edema. No substantial pleural effusion. Telemetry leads overlie the chest. IMPRESSION: Hyperexpansion with streaky left base atelectasis. Electronically Signed   By: Camellia Candle M.D.   On: 08/09/2023 09:44     EKG: EKG Interpretation Date/Time:  Tuesday August 09 2023 09:10:59 EDT Ventricular Rate:  104 PR Interval:  154 QRS Duration:  113 QT Interval:  329 QTC Calculation: 433 R Axis:   104  Text Interpretation: Sinus tachycardia Incomplete right bundle branch block Non-specific ST-t changes Baseline wander Confirmed by Bernard Drivers ([id number]) on 08/09/2023 9:13:51 AM  Radiology: US  Venous Img Lower  Left  (DVT Study) Result Date: 08/09/2023 CLINICAL  DATA:  Left lower extremity swelling. EXAM: LEFT LOWER EXTREMITY VENOUS DOPPLER ULTRASOUND TECHNIQUE: Gray-scale sonography with graded compression, as well as color Doppler and duplex ultrasound were performed to evaluate the lower extremity deep venous systems from the level of the common femoral vein and including the common femoral, femoral, profunda femoral, popliteal and calf veins including the posterior tibial, peroneal and gastrocnemius veins when visible. The superficial great saphenous vein was also interrogated. Spectral Doppler was utilized to evaluate flow at rest and with distal augmentation maneuvers in the common femoral, femoral and popliteal veins. COMPARISON:  None Available. FINDINGS: Contralateral Common Femoral Vein: Respiratory phasicity is normal and symmetric with the symptomatic side. No evidence of thrombus. Normal compressibility. Common Femoral Vein: No evidence of thrombus. Normal compressibility, respiratory phasicity and response to augmentation. Saphenofemoral Junction: No evidence of thrombus. Normal compressibility and flow on color Doppler imaging. Profunda Femoral Vein: No evidence of thrombus. Normal compressibility and flow on color Doppler imaging. Femoral Vein: No evidence of thrombus. Normal compressibility, respiratory phasicity and response to augmentation. Popliteal Vein: No evidence of thrombus. Normal compressibility, respiratory phasicity and response to augmentation. Calf Veins: No evidence of thrombus. Normal compressibility and flow on color Doppler imaging. Other Findings:  None. IMPRESSION: No evidence of deep venous thrombosis. Electronically Signed   By: Camellia Candle M.D.   On: 08/09/2023 10:50   DG Chest 2 View Result Date: 08/09/2023 CLINICAL DATA:  Chest pain. EXAM: CHEST - 2 VIEW COMPARISON:  06/16/2022 FINDINGS: Lungs are hyperexpanded. The cardio pericardial silhouette is enlarged. Streaky density in the  left base suggest atelectasis. No focal consolidation or overt pulmonary edema. No substantial pleural effusion. Telemetry leads overlie the chest. IMPRESSION: Hyperexpansion with streaky left base atelectasis. Electronically Signed   By: Camellia Candle M.D.   On: 08/09/2023 09:44     Procedures   Medications Ordered in the ED  acetaminophen  (TYLENOL ) tablet 1,000 mg (1,000 mg Oral Patient Refused/Not Given 08/09/23 1003)  famotidine (PEPCID) tablet 20 mg (20 mg Oral Given 08/09/23 1001)  alum & mag hydroxide-simeth (MAALOX/MYLANTA) 200-200-20 MG/5ML suspension 30 mL (30 mLs Oral Given 08/09/23 1001)  lactated ringers  bolus 1,000 mL (1,000 mLs Intravenous New Bag/Given 08/09/23 1138)                                    Medical Decision Making Problems Addressed: Elevated blood pressure reading: acute illness or injury Essential hypertension: chronic illness or injury with exacerbation, progression, or side effects of treatment that poses a threat to life or bodily functions Precordial chest pain: acute illness or injury with systemic symptoms that poses a threat to life or bodily functions  Amount and/or Complexity of Data Reviewed External Data Reviewed: notes. Labs: ordered. Decision-making details documented in ED Course. Radiology: ordered and independent interpretation performed. Decision-making details documented in ED Course. ECG/medicine tests: ordered and independent interpretation performed. Decision-making details documented in ED Course.  Risk OTC drugs. Prescription drug management. Decision regarding hospitalization.   Iv ns. Continuous pulse ox and cardiac monitoring. Labs ordered/sent. Imaging ordered.   Differential diagnosis includes acs, msk cp, gi cp, pna, viral syndrome, etc. Dispo decision including potential need for admission considered - will get labs and imaging and reassess.   Reviewed nursing notes and prior charts for additional history. External reports  reviewed.    Cardiac monitor: sinus rhythm, rate 88.  Labs reviewed/interpreted by me - wbc and hgb  normal. Chem unremarkable. Bnp not significantly elevated. Trop normal. UA pending.   Additional labs reviewed/interpreted by me - ua neg for uti. Delta trop normal - felt not c/w acs.   Xrays reviewed/interpreted by me - no pna ?atelelctasis.   U/s reviewed/interpreted by me - no dvt.   Po fluids/food. Ambulate in ED.   Pt with no chest pain or sob. No increased wob. Tolerating po. Ambulated. On recheck no focal pain or other c/o. Labs reassuring.   Recheck again, no new c/o pt comfortable. No distress. Indicates up to bathroom a couple times, feels fine, normal. Currently hr 86, rr 14.   Pt currently appears stable for ED d/c.   Rec close pcp f/u.  Return precautions provided.       Final diagnoses:  None    ED Discharge Orders     None          Bernard Drivers, MD 08/09/23 1453

## 2023-08-15 DIAGNOSIS — I129 Hypertensive chronic kidney disease with stage 1 through stage 4 chronic kidney disease, or unspecified chronic kidney disease: Secondary | ICD-10-CM | POA: Diagnosis not present

## 2023-08-15 DIAGNOSIS — I5032 Chronic diastolic (congestive) heart failure: Secondary | ICD-10-CM | POA: Diagnosis not present

## 2023-08-15 DIAGNOSIS — N1831 Chronic kidney disease, stage 3a: Secondary | ICD-10-CM | POA: Diagnosis not present

## 2023-08-15 DIAGNOSIS — D45 Polycythemia vera: Secondary | ICD-10-CM | POA: Diagnosis not present

## 2023-08-25 ENCOUNTER — Inpatient Hospital Stay: Attending: Hematology

## 2023-08-25 DIAGNOSIS — D45 Polycythemia vera: Secondary | ICD-10-CM | POA: Diagnosis not present

## 2023-08-25 LAB — CBC
HCT: 44.3 % (ref 36.0–46.0)
Hemoglobin: 12.5 g/dL (ref 12.0–15.0)
MCH: 20.4 pg — ABNORMAL LOW (ref 26.0–34.0)
MCHC: 28.2 g/dL — ABNORMAL LOW (ref 30.0–36.0)
MCV: 72.1 fL — ABNORMAL LOW (ref 80.0–100.0)
Platelets: 451 K/uL — ABNORMAL HIGH (ref 150–400)
RBC: 6.14 MIL/uL — ABNORMAL HIGH (ref 3.87–5.11)
RDW: 20.5 % — ABNORMAL HIGH (ref 11.5–15.5)
WBC: 6 K/uL (ref 4.0–10.5)
nRBC: 0 % (ref 0.0–0.2)

## 2023-08-26 ENCOUNTER — Encounter: Payer: Self-pay | Admitting: Cardiology

## 2023-08-26 ENCOUNTER — Inpatient Hospital Stay

## 2023-08-26 ENCOUNTER — Ambulatory Visit: Attending: Cardiology | Admitting: Cardiology

## 2023-08-26 VITALS — BP 146/88 | HR 86 | Ht 63.0 in | Wt 188.2 lb

## 2023-08-26 DIAGNOSIS — I10 Essential (primary) hypertension: Secondary | ICD-10-CM

## 2023-08-26 DIAGNOSIS — I25119 Atherosclerotic heart disease of native coronary artery with unspecified angina pectoris: Secondary | ICD-10-CM | POA: Diagnosis not present

## 2023-08-26 DIAGNOSIS — N1832 Chronic kidney disease, stage 3b: Secondary | ICD-10-CM

## 2023-08-26 DIAGNOSIS — Z789 Other specified health status: Secondary | ICD-10-CM | POA: Diagnosis not present

## 2023-08-26 NOTE — Progress Notes (Signed)
 Cardiology Office Note  Date: 08/26/2023   ID: [name], [name] 19-Nov-1945, MRN [id number]  History of Present Illness: [name] [name] is a 78 y.o. female last seen in March.  She presents for a follow-up visit after ER encounter on July 1 for assessment of chest discomfort.  She tells me that she had been working in her garden out in the heat, felt a discomfort in her chest when she was moving about, also somewhat dizzy.  Questioned whether she had gotten somewhat dehydrated.  Chest x-ray with ER workup described hyperexpansion with streaky left base atelectasis.  She did not have a leukocytosis.  High-sensitivity troponin I levels were normal.  BNP minimally increased at 104.  No substantial change in renal function.  She does not report any cough, no fevers or chills at that time.  She states that since assessment she has been back to baseline with no recurring symptoms.  We went over her medications.  She did not bring her bottles in today, present MAR does not match the number of medications that she states she is taking.  She states that she will call us  back with the names.  A call to her pharmacy indicated that she was only taking minoxidil  10 mg daily, and had recently picked up a prescription for spironolactone  25 mg daily.  She tells me that she does not tolerate spironolactone  since she has to urinate more frequently.  Physical Exam: VS:  BP (!) 146/88 (BP Location: Left Arm)   Pulse 86   Ht 5' 3 (1.6 m)   Wt 188 lb 3.2 oz (85.4 kg)   SpO2 96%   BMI 33.34 kg/m , BMI Body mass index is 33.34 kg/m.  Wt Readings from Last 3 Encounters:  08/26/23 188 lb 3.2 oz (85.4 kg)  08/09/23 193 lb 9 oz (87.8 kg)  07/18/23 193 lb 9 oz (87.8 kg)    General: Patient appears comfortable at rest. HEENT: Conjunctiva and lids normal. Neck: Supple, no elevated JVP or carotid bruits. Lungs: Clear to auscultation, nonlabored breathing at rest. Cardiac: Regular rate and rhythm, no S3,  1/6 systolic murmur. Extremities: No pitting edema.  ECG:  An ECG dated 08/09/2023 was personally reviewed today and demonstrated:  Sinus tachycardia with decreased R wave progression and lead motion artifact.  Labwork: 08/09/2023: ALT 14; AST 22; B Natriuretic Peptide 104.0; BUN 15; Creatinine, Ser 1.18; Potassium 3.6; Sodium 140 08/25/2023: Hemoglobin 12.5; Platelets 451     Component Value Date/Time   CHOL 172 01/28/2022 1013   TRIG 78 01/28/2022 1013   HDL 51 01/28/2022 1013   CHOLHDL 3.4 01/28/2022 1013   CHOLHDL 3.6 09/04/2019 1216   VLDL 13 09/04/2019 1216   LDLCALC 106 (H) 01/28/2022 1013   Other Studies Reviewed Today:  No interval cardiac testing for review today.  Assessment and Plan:  1.  CAD with history of NSTEMI in 2015 secondary to occluded distal circumflex as well as mid to distal OM1 stenosis that was managed medically.  LVEF 55 to 60% by echocardiogram in December 2023.  She does not report any clear-cut angina at this time.  Episode of chest discomfort earlier in the month more atypical and not associated with ACS by ER workup.  She does not report any recurring symptoms.  Will continue with observation at this point.   2.  Primary hypertension.  Difficult to control and complicated by a number of medication intolerances.  She will call back with her antihypertensive  medications to clarify MAR.   3.  Mixed hyperlipidemia.  LDL 106 in December 2023.  She declines medical therapy for treatment.   4.  CKD stage IIIb.  Recent creatinine 1.18 with GFR 48.  Disposition:  Follow up 6 months.  Signed, [name] [name], M.D., F.A.C.C. [facility]

## 2023-08-26 NOTE — Patient Instructions (Addendum)

## 2023-09-05 ENCOUNTER — Encounter: Payer: Self-pay | Admitting: Hematology

## 2023-09-05 NOTE — Progress Notes (Signed)
 Erroneous encounter

## 2023-09-15 ENCOUNTER — Encounter (HOSPITAL_COMMUNITY): Payer: Self-pay

## 2023-09-15 ENCOUNTER — Other Ambulatory Visit: Payer: Self-pay

## 2023-09-15 ENCOUNTER — Emergency Department (HOSPITAL_COMMUNITY): Admission: EM | Admit: 2023-09-15 | Discharge: 2023-09-15 | Disposition: A | Discharge status: Home / Self Care

## 2023-09-15 ENCOUNTER — Emergency Department (HOSPITAL_COMMUNITY)

## 2023-09-15 DIAGNOSIS — I2510 Atherosclerotic heart disease of native coronary artery without angina pectoris: Secondary | ICD-10-CM | POA: Diagnosis not present

## 2023-09-15 DIAGNOSIS — M1612 Unilateral primary osteoarthritis, left hip: Secondary | ICD-10-CM | POA: Diagnosis not present

## 2023-09-15 DIAGNOSIS — I129 Hypertensive chronic kidney disease with stage 1 through stage 4 chronic kidney disease, or unspecified chronic kidney disease: Secondary | ICD-10-CM | POA: Diagnosis not present

## 2023-09-15 DIAGNOSIS — N183 Chronic kidney disease, stage 3 unspecified: Secondary | ICD-10-CM | POA: Diagnosis not present

## 2023-09-15 DIAGNOSIS — M25552 Pain in left hip: Secondary | ICD-10-CM | POA: Diagnosis not present

## 2023-09-15 MED ORDER — NAPROXEN 250 MG PO TABS
500.0000 mg | ORAL_TABLET | Freq: Once | ORAL | Status: DC
  Filled 2023-09-15: qty 2

## 2023-09-15 MED ORDER — LIDOCAINE 5 % EX PTCH
1.0000 | MEDICATED_PATCH | CUTANEOUS | Status: DC
  Administered 2023-09-15: 1 via TRANSDERMAL
  Filled 2023-09-15: qty 1

## 2023-09-15 MED ORDER — NAPROXEN 250 MG PO TABS: 500.0000 mg | ORAL_TABLET | Freq: Once | ORAL | Status: DC

## 2023-09-15 MED ORDER — METHOCARBAMOL 500 MG PO TABS
500.0000 mg | ORAL_TABLET | Freq: Once | ORAL | Status: DC
  Filled 2023-09-15: qty 1

## 2023-09-15 MED ORDER — LIDOCAINE 5 % EX PTCH: 1.0000 | MEDICATED_PATCH | CUTANEOUS | 0 refills | Status: DC

## 2023-09-15 NOTE — ED Triage Notes (Signed)
 Pt arrived via POV c/o left hip/leg pain that has been constant for the past few days. Pt denies injury.

## 2023-09-15 NOTE — ED Provider Notes (Signed)
 [place] EMERGENCY DEPARTMENT AT [facility] Provider Note   CSN: [id number] Arrival date & time: 09/15/23  9082     Patient presents with: Leg Pain   [name] [name] is a 78 y.o. female.  History of hypertension, CAD, polycythemia vera treated with phlebotomy and CKD stage III.  Presents the ER for left hip pain rating down the lateral aspect of the left leg into her foot only.  She denies injury or trauma, denies leg swelling.  Pain is worse with walking and movement.  She is not taking any medications at home.  No abdominal pain, no urinary symptoms, no flank pain.     Leg Pain      Prior to Admission medications   Medication Sig Start Date End Date Taking? Authorizing Provider  lidocaine  (LIDODERM ) 5 % Place 1 patch onto the skin daily. Remove & Discard patch within 12 hours or as directed by MD 09/15/23  Yes Suellen, Korra Christine A, PA-C  Blood Pressure Monitoring KIT 1 each by Does not apply route as directed. Dx: resistant hypertension 11/16/22   Miriam Norris, NP  hydroxyurea  (HYDREA ) 500 MG capsule Take 1 capsule (500 mg total) by mouth every Monday, Wednesday, and Friday. Take on Monday and Friday.  May take with food to minimize GI side effects. 07/18/23   Lamon Pleasant HERO, PA-C  minoxidil  (LONITEN ) 10 MG tablet Take 10 mg by mouth daily. 07/18/23   [provider]    Allergies: Fluviral [influenza virus vaccine], Haemophilus influenzae, Hydralazine , Amlodipine , Lisinopril, and Ranexa  [ranolazine ]    Review of Systems  Updated Vital Signs BP 125/71   Pulse 76   Temp 97.7 F (36.5 C) (Oral)   Resp 20   Ht 5' 3 (1.6 m)   Wt 85.3 kg   SpO2 92%   BMI 33.31 kg/m   Physical Exam Vitals and nursing note reviewed.  Constitutional:      General: She is not in acute distress.    Appearance: She is well-developed.  HENT:     Head: Normocephalic and atraumatic.     Mouth/Throat:     Mouth: Mucous membranes are moist.  Eyes:     Extraocular  Movements: Extraocular movements intact.     Conjunctiva/sclera: Conjunctivae normal.     Pupils: Pupils are equal, round, and reactive to light.  Cardiovascular:     Rate and Rhythm: Normal rate and regular rhythm.     Heart sounds: No murmur heard. Pulmonary:     Effort: Pulmonary effort is normal. No respiratory distress.     Breath sounds: Normal breath sounds.  Abdominal:     Palpations: Abdomen is soft.     Tenderness: There is no abdominal tenderness.  Musculoskeletal:        General: No swelling.     Cervical back: Neck supple.     Comments: Tenderness to the left lumbar area left lateral hip.  Normal range of motion of left hip knee and ankle.  DP and PT pulses in left foot are intact.  No tenderness of the left calf  Skin:    General: Skin is warm and dry.     Capillary Refill: Capillary refill takes less than 2 seconds.  Neurological:     General: No focal deficit present.     Mental Status: She is alert and oriented to person, place, and time.  Psychiatric:        Mood and Affect: Mood normal.     (all labs ordered  are listed, but only abnormal results are displayed) Labs Reviewed - No data to display  EKG: None  Radiology: DG Hip Unilat W or Wo Pelvis 2-3 Views Left Result Date: 09/15/2023 CLINICAL DATA:  Pain, no known injury. EXAM: DG HIP (WITH OR WITHOUT PELVIS) 2-3V LEFT COMPARISON:  Radiograph 03/18/2014 FINDINGS: Slight left hip joint space narrowing and acetabular spurring has developed from prior exam. The femoral head is well seated. No fracture, erosion or evidence of avascular necrosis. No evidence of focal bone abnormality. Pubic rami are intact. Trace degenerative spurring of both sacroiliac joints. There is minor right hip osteoarthritis. Pelvic phleboliths. IMPRESSION: Mild left hip osteoarthritis.  No acute findings. Electronically Signed   By: Andrea Gasman M.D.   On: 09/15/2023 11:24     Procedures   Medications Ordered in the ED  lidocaine   (LIDODERM ) 5 % 1 patch (1 patch Transdermal Patch Applied 09/15/23 1042)  methocarbamol  (ROBAXIN ) tablet 500 mg (0 mg Oral Hold 09/15/23 1046)  naproxen  (NAPROSYN ) tablet 500 mg (0 mg Oral Hold 09/15/23 1046)                                    Medical Decision Making This patient presents to the ED for concern of left hip pain that radiates down the lateral aspect of the leg, this involves an extensive number of treatment options, and is a complaint that carries with it a high risk of complications and morbidity.  The differential diagnosis includes neuritis, bursitis, sprain, strain, radiculopathy, other   Co morbidities that complicate the patient evaluation  Polycythemia, CKD   Additional history obtained:  Additional history obtained from EMR External records from outside source obtained and reviewed including prior notes, labs     Imaging Studies ordered:  I ordered imaging studies including a left hip I independently visualized and interpreted imaging which showed mild osteoarthritis I agree with the radiologist interpretation     Problem List / ED Course / Critical interventions / Medication management  Left hip pain x 1 week-patient not taking meds at home, ambulating with a cane.  States today was not able to put pressure on the hip.  Pain is primarily to the left lateral hip there is no overlying redness or swelling.  There is tenderness in this area with some tenderness over the posterior hip as well.  Negative straight leg raise, no calf swelling or tenderness, intact distal pulses.  Do not arthritis DVT or other emergent cause of her pain.  I considered possible radiculopathy but she has negative straight leg raise, x-ray does show osteoarthritis.  Edition she does not have any saddle anesthesia or paresthesia, no bowel or bladder incontinence and no leg weakness, do not suspect cord compression.  No systemic symptoms to suggest infection.  She was given a lidocaine  patch  and is now able to bear weight and ambulate and states she is feeling better.  I offered her muscle relaxers, dose of NSAIDs in the ER which she did not want, also offered steroids for home to help with inflammation and she did not want to take these either.  She states she does not like to take medications.  Will prescribe lidocaine  patches for home and advised on close outpatient follow-up. I ordered medication including lidoderm   for hip pain  Reevaluation of the patient after these medicines showed that the patient improved I have reviewed the patients home medicines and  have made adjustments as needed     Amount and/or Complexity of Data Reviewed Radiology: ordered.  Risk Prescription drug management.        Final diagnoses:  Hip pain, left    ED Discharge Orders          Ordered    lidocaine  (LIDODERM ) 5 %  Every 24 hours        09/15/23 [address] A, PA-C 09/15/23 1202    Kammerer, Megan L, DO 09/15/23 1617

## 2023-09-15 NOTE — Discharge Instructions (Addendum)
 It was a pleasure taking care of you today.  You are seen in the ER for left hip pain.  Your x-ray showed some mild arthritis but no other acute findings.  I am glad you are feeling better with the lidocaine  patch.  You can continue using this since you do not want to take other oral medications.  Please follow-up with your PCP and/or orthopedics.  Come back if you have severe pain, swelling, fevers or other worrisome changes.

## 2023-09-20 ENCOUNTER — Telehealth: Payer: Self-pay | Admitting: General Practice

## 2023-09-20 NOTE — Telephone Encounter (Signed)
 Previously saw Dr Melvenia would like to establish with a new provider since Dr Melvenia left.  Thank you,  Corean,  AMB Clinical Support [facility] AWV Program Direct Dial ??[phone]

## 2023-09-20 NOTE — Telephone Encounter (Signed)
 Scheduled with Leita.

## 2023-09-21 ENCOUNTER — Ambulatory Visit: Payer: Self-pay

## 2023-10-07 NOTE — Progress Notes (Unsigned)
 [name] [address], [place] [address]   CLINIC:  Medical Oncology/Hematology  PCP:  Patient, No Pcp Per No address on file None    *** RESUME PREP BELOW ***    REASON FOR VISIT:  Follow-up for JAK2 polycythemia vera   PRIOR THERAPY: None   CURRENT THERAPY: Hydrea  500 mg M/F (patient not taking as prescribed)*** + aspirin  81 mg + phlebotomy  INTERVAL HISTORY:   [name] [name] 78 y.o. female returns for routine follow-up of JAK2 polycythemia vera.   She was last seen by [name] Barefoot PA-C on 04/18/2023. ***Last phlebotomies were on 04/25/2023 and 06/08/2023.   At today's visit, she reports feeling fairly well overall.*** She reports that she has not taken her Hydrea  for the past 3 months, because she wanted to see how her blood counts would respond without cytoreductive therapy.  *** Restarted after last visit?  *** She has declined to take aspirin  after doing her own research.  *** She denies any skin sores, mouth sores, fatigue, or GI upset. *** She reports good energy, and stays active doing cardio and weightlifting throughout the week. ***   Overall, she feels better after starting phlebotomy, and reports decreased headaches and improved tinnitus.*** She has also been following with her PCP (Dr. Melvenia) for management of hypertension.  *** She denies any aquagenic pruritus, erythromelalgia, or B symptoms.***  She has 100***% energy and 100***% appetite.  ***She endorses that she is maintaining a stable weight.  ASSESSMENT & PLAN:  1.  JAK2 positive polycythemia vera - Seen at request of Dr. Rachele for elevated hemoglobin and platelet count (CBC from 11/20/2021 showed Hgb 16.9/hematocrit 51.1, platelets 613) - Review of EMR shows platelet count elevated since August 2015,  Hemoglobin elevated since January 2019 - No history of DVT, PE, MI, or CVA. - Hematology work-up (11/20/2021):  JAK2 V617F positive.  Low erythropoietin  1.8 - Hydrea   started on 01/14/2022.  Unable to tolerate dosage >1000 mg daily due to side effects.  Currently prescribed Hydrea  500 mg Monday/Friday, but has not been taking Hydrea  x 3 months.*** - Aspirin  81 mg recommended with extensive discussion regarding medical indication for antiplatelet agents.  Patient states that she did her own research and knows that it could give her a heart attack, so she does not take it - She has required intermittent phlebotomy for control of hematocrit, which she is tolerating well.  Last phlebotomy was on 06/08/2023. - Labs today (***): Hgb ***/RBC ***/hematocrit ***%, platelets ***.  ***Normal WBC/differential.  Minimally ***elevated LDH ***. Baseline CKD ***stage IIIa/b. Normal LFTs.*** - She is feeling better (decreased headaches and tinnitus, improved blood pressure) after starting phlebotomy, but reports worsening headaches when hematocrit trends upward.*** - Extensive discussion regarding diagnosis of polycythemia vera, including risk of thrombosis (CVA, MI, DVT, PE) as well as risk of myelofibrosis and leukemia in some patients. - Based on her age, she is considered high risk and therefore recommended cytoreductive therapy with hydroxyurea .  Due to side effects from increased dose of hydroxyurea , we will use combination of Hydrea  and phlebotomy to achieve target blood counts. Patient was inquiring about treatment with PTG-300 ([place]), which is currently only available in clinical trials.  Discussed with patient that clinical trials are available in  , and if she would like to pursue this we can place referral. - PLAN: *** TBD.  *** Discussed importance of compliance with Hydrea  with patient.  She is hoping to cut back on frequency of  phlebotomy, and therefore agrees to restart Hydrea  500 mg MWF. - No phlebotomy today, but recommend CBC + possible phlebotomy every 6 weeks (recommend phlebotomy if HCT >45.0, to see if more aggressive phlebotomy will help with  headaches and blood pressure) - Recommended aspirin  81 mg daily (patient refuses) - Labs in 3 months = CBC/D, CMP, LDH  - OFFICE in 3 months (same-day labs + possible phlebotomy)    2.  Social/family history: - Lives at home with her family.  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  She was never smoker. - No family history of leukemias or malignancies.  PLAN SUMMARY:*** >> CBC + possible therapeutic phlebotomy every 6 weeks (if HCT > 45.0) >> Labs in 3 months = CBC/D, CMP, LDH >> OFFICE visit in 3 months (same day as labs + possible phlebotomy)     REVIEW OF SYSTEMS:***  Review of Systems  Constitutional:  Negative for appetite change, chills, diaphoresis, fatigue, fever and unexpected weight change.  HENT:   Negative for lump/mass and nosebleeds.   Eyes:  Negative for eye problems.  Respiratory:  Positive for cough. Negative for hemoptysis and shortness of breath.   Cardiovascular:  Positive for palpitations. Negative for chest pain and leg swelling.  Gastrointestinal:  Negative for abdominal pain, blood in stool, constipation, diarrhea, nausea and vomiting.  Genitourinary:  Negative for hematuria.   Skin: Negative.   Neurological:  Positive for headaches. Negative for dizziness and light-headedness.  Hematological:  Does not bruise/bleed easily.     PHYSICAL EXAM:***  ECOG PERFORMANCE STATUS: 1 - Symptomatic but completely ambulatory  There were no vitals filed for this visit.   There were no vitals filed for this visit.   Physical Exam Constitutional:      Appearance: Normal appearance. She is obese.  Cardiovascular:     Heart sounds: Normal heart sounds.  Pulmonary:     Breath sounds: Normal breath sounds.  Neurological:     General: No focal deficit present.     Mental Status: Mental status is at baseline.  Psychiatric:        Behavior: Behavior normal. Behavior is cooperative.    PAST  MEDICAL/SURGICAL HISTORY:  Past Medical History:  Diagnosis Date   Anxiety    Dysphagia 09/03/2020   Essential hypertension    HLD (hyperlipidemia)    Hx of statin intolerance   Hypothyroidism    NSTEMI (non-ST elevated myocardial infarction) (HCC) 2015   a.  Related to occlusion of the distal Cx. The mid to distal OM1 also contains a significant stenosis that is best treated with medical therapy. Patent RCA/LAD with tortuosity. LM patent. EF 55%.    Polycythemia vera (HCC)    Stage 3b chronic kidney disease    Past Surgical History:  Procedure Laterality Date   ABDOMINAL HYSTERECTOMY     CARDIAC CATHETERIZATION  ~ 2000   CATARACT EXTRACTION W/PHACO Right 12/24/2022   Procedure: CATARACT EXTRACTION PHACO AND INTRAOCULAR LENS PLACEMENT (IOC);  Surgeon: Harrie Agent, MD;  Location: [facility];  Service: Ophthalmology;  Laterality: Right;  CDE - 15.11   CORONARY ANGIOPLASTY  09/20/2013   LEFT HEART CATHETERIZATION WITH CORONARY ANGIOGRAM N/A 09/20/2013   Procedure: LEFT HEART CATHETERIZATION WITH CORONARY ANGIOGRAM;  Surgeon: Victory LELON Claudene DOUGLAS, MD;  Location: [facility] C[facility] LAB;  Service: Cardiovascular;  Laterality: N/A;   LEFT HEART CATHETERIZATION WITH CORONARY ANGIOGRAM N/A 09/27/2013   Procedure: LEFT HEART CATHETERIZATION WITH CORONARY  GERALYN;  Surgeon: Ozell JONETTA Fell, MD;  Location: [name] CATH [facility];  Service: Cardiovascular;  Laterality: N/A;   THYROIDECTOMY  10/08/2011   Procedure: THYROIDECTOMY;  Surgeon: Oneil DELENA Budge, MD;  Location: [facility];  Service: General;  Laterality: N/A;  Total   VAGINAL HYSTERECTOMY  1993    SOCIAL HISTORY:  Social History   Socioeconomic History   Marital status: Widowed    Spouse name: Not on file   Number of children: Not on file   Years of education: Not on file   Highest education level: Not on file  Occupational History   Not on file  Tobacco Use   Smoking status: Never    Passive exposure: Never   Smokeless tobacco: Never  Vaping Use   Vaping  status: Never Used  Substance and Sexual Activity   Alcohol  use: No    Alcohol /week: 0.0 standard drinks of alcohol    Drug use: No   Sexual activity: Never  Other Topics Concern   Not on file  Social History Narrative   ** Merged History Encounter **  Lives at home with family.  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      Social Drivers of Corporate investment banker Strain: Low Risk  (07/21/2020)   Overall Financial Resource Strain (CARDIA)    Difficulty of Paying Living Expenses: Not very hard  Food Insecurity: No Food Insecurity (07/21/2020)   Hunger Vital Sign    Worried About Running Out of Food in the Last Year: Never true    Ran Out of Food in the Last Year: Never true  Transportation Needs: No Transportation Needs (09/08/2020)   PRAPARE - Administrator, Civil Service (Medical): No    Lack of Transportation (Non-Medical): No  Physical Activity: Sufficiently Active (03/11/2020)   Exercise Vital Sign    Days of Exercise per Week: 5 days    Minutes of Exercise per Session: 30 min  Stress: No Stress Concern Present (03/11/2020)   [employer] of Occupational Health - Occupational Stress Questionnaire    Feeling of Stress : Not at all  Social Connections: Not on file  Intimate Partner Violence: Not on file    FAMILY HISTORY:  Family History  Problem Relation Age of Onset   Colon cancer Father    Thyroid  disease Neg Hx     CURRENT MEDICATIONS:  Outpatient Encounter Medications as of 10/11/2023  Medication Sig   Blood Pressure Monitoring KIT 1 each by Does not apply route as directed. Dx: resistant hypertension   hydroxyurea  (HYDREA ) 500 MG capsule Take 1 capsule (500 mg total) by mouth every Monday, Wednesday, and Friday. Take on Monday and Friday.  May take with food to minimize GI side effects.   lidocaine  (LIDODERM ) 5 % Place 1 patch onto the skin daily. Remove & Discard patch within 12 hours or as directed by MD   minoxidil  (LONITEN ) 10 MG tablet Take 10 mg by mouth  daily.   No facility-administered encounter medications on file as of 10/11/2023.    ALLERGIES:  Allergies  Allergen Reactions   Fluviral [Influenza Virus Vaccine] Shortness Of Breath, Swelling and Other (See Comments)    Including back pain that radiates in upper extremeties   Haemophilus Influenzae Other (See Comments), Shortness Of Breath and Swelling    Including back pain that radiates in upper extremeties   Hydralazine  Shortness Of Breath   Amlodipine      Lip swelling per patient.    Lisinopril  Dr. Fanta stopped due to side effects per patient.  Could remember what side effects were.     Ranexa  [Ranolazine ]     Chest pain    LABORATORY DATA:  I have reviewed the labs as listed.  CBC    Component Value Date/Time   WBC 6.0 08/25/2023 0822   RBC 6.14 (H) 08/25/2023 0822   HGB 12.5 08/25/2023 0822   HCT 44.3 08/25/2023 0822   PLT 451 (H) 08/25/2023 0822   MCV 72.1 (L) 08/25/2023 0822   MCH 20.4 (L) 08/25/2023 0822   MCHC 28.2 (L) 08/25/2023 0822   RDW 20.5 (H) 08/25/2023 0822   LYMPHSABS 1.4 08/09/2023 0912   MONOABS 0.2 08/09/2023 0912   EOSABS 0.3 08/09/2023 0912   BASOSABS 0.0 08/09/2023 0912      Latest Ref Rng & Units 08/09/2023    9:12 AM 07/18/2023    8:04 AM 04/18/2023    9:24 AM  CMP  Glucose 70 - 99 mg/dL 865  99  876   BUN 8 - 23 mg/dL 15  17  19    Creatinine 0.44 - 1.00 mg/dL 8.81  8.76  8.64   Sodium 135 - 145 mmol/L 140  136  139   Potassium 3.5 - 5.1 mmol/L 3.6  3.4  3.3   Chloride 98 - 111 mmol/L 105  102  102   CO2 22 - 32 mmol/L 23  25  26    Calcium  8.9 - 10.3 mg/dL 8.9  8.8  9.0   Total Protein 6.5 - 8.1 g/dL 7.0  7.3  7.5   Total Bilirubin 0.0 - 1.2 mg/dL 0.7  0.7  1.0   Alkaline Phos 38 - 126 U/L 50  50  54   AST 15 - 41 U/L 22  29  27    ALT 0 - 44 U/L 14  17  20      DIAGNOSTIC IMAGING:  I have independently reviewed the relevant imaging and discussed with the patient.   WRAP UP:  All questions were answered. The patient knows to  call the clinic with any problems, questions or concerns.  Medical decision making: Moderate  Time spent on visit: I spent 20 minutes counseling the patient face to face. The total time spent in the appointment was 30 minutes and more than 50% was on counseling.  [name] CHRISTELLA Barefoot, PA-C  ***

## 2023-10-11 ENCOUNTER — Inpatient Hospital Stay: Attending: Hematology | Admitting: Physician Assistant

## 2023-10-11 ENCOUNTER — Inpatient Hospital Stay

## 2023-10-11 VITALS — BP 167/86 | HR 80 | Temp 97.4°F | Resp 18 | Wt 192.5 lb

## 2023-10-11 DIAGNOSIS — Z1589 Genetic susceptibility to other disease: Secondary | ICD-10-CM

## 2023-10-11 DIAGNOSIS — N1832 Chronic kidney disease, stage 3b: Secondary | ICD-10-CM | POA: Diagnosis not present

## 2023-10-11 DIAGNOSIS — D45 Polycythemia vera: Secondary | ICD-10-CM | POA: Diagnosis not present

## 2023-10-11 DIAGNOSIS — R7402 Elevation of levels of lactic acid dehydrogenase (LDH): Secondary | ICD-10-CM | POA: Insufficient documentation

## 2023-10-11 DIAGNOSIS — Z800 Family history of malignant neoplasm of digestive organs: Secondary | ICD-10-CM | POA: Diagnosis not present

## 2023-10-11 LAB — COMPREHENSIVE METABOLIC PANEL WITH GFR
ALT: 13 U/L (ref 0–44)
AST: 21 U/L (ref 15–41)
Albumin: 3.7 g/dL (ref 3.5–5.0)
Alkaline Phosphatase: 54 U/L (ref 38–126)
Anion gap: 11 (ref 5–15)
BUN: 18 mg/dL (ref 8–23)
CO2: 25 mmol/L (ref 22–32)
Calcium: 9 mg/dL (ref 8.9–10.3)
Chloride: 101 mmol/L (ref 98–111)
Creatinine, Ser: 1.25 mg/dL — ABNORMAL HIGH (ref 0.44–1.00)
GFR, Estimated: 44 mL/min — ABNORMAL LOW (ref >60)
Glucose, Bld: 98 mg/dL (ref 70–99)
Potassium: 3.5 mmol/L (ref 3.5–5.1)
Sodium: 137 mmol/L (ref 135–145)
Total Bilirubin: 0.7 mg/dL (ref 0.0–1.2)
Total Protein: 7.4 g/dL (ref 6.5–8.1)

## 2023-10-11 LAB — CBC WITH DIFFERENTIAL/PLATELET
Abs Immature Granulocytes: 0.02 K/uL (ref 0.00–0.07)
Basophils Absolute: 0 K/uL (ref 0.0–0.1)
Basophils Relative: 0 %
Eosinophils Absolute: 0.3 K/uL (ref 0.0–0.5)
Eosinophils Relative: 5 %
HCT: 46.6 % — ABNORMAL HIGH (ref 36.0–46.0)
Hemoglobin: 13.4 g/dL (ref 12.0–15.0)
Immature Granulocytes: 0 %
Lymphocytes Relative: 32 %
Lymphs Abs: 1.9 K/uL (ref 0.7–4.0)
MCH: 20.3 pg — ABNORMAL LOW (ref 26.0–34.0)
MCHC: 28.8 g/dL — ABNORMAL LOW (ref 30.0–36.0)
MCV: 70.6 fL — ABNORMAL LOW (ref 80.0–100.0)
Monocytes Absolute: 0.2 K/uL (ref 0.1–1.0)
Monocytes Relative: 4 %
Neutro Abs: 3.5 K/uL (ref 1.7–7.7)
Neutrophils Relative %: 59 %
Platelets: 428 K/uL — ABNORMAL HIGH (ref 150–400)
RBC: 6.6 MIL/uL — ABNORMAL HIGH (ref 3.87–5.11)
RDW: 21.3 % — ABNORMAL HIGH (ref 11.5–15.5)
Smear Review: NORMAL
WBC: 5.9 K/uL (ref 4.0–10.5)
nRBC: 0 % (ref 0.0–0.2)

## 2023-10-11 LAB — LACTATE DEHYDROGENASE: LDH: 224 U/L — ABNORMAL HIGH (ref 98–192)

## 2023-10-11 NOTE — Progress Notes (Unsigned)
 [name] presents today for phlebotomy per MD orders.  Attempted x 1 for phlebotomy. Unsuccessful.    Phlebotomy procedure started at  and ended at ***. *** grams removed. Patient observed for 30 minutes after procedure without any incident. Patient tolerated procedure well. IV needle removed intact.

## 2023-10-11 NOTE — Patient Instructions (Addendum)
 [facility] at [facility] **VISIT SUMMARY & IMPORTANT INSTRUCTIONS **   You were seen today by Pleasant Barefoot PA-C for your polycythemia vera.    POLYCYTHEMIA VERA Your platelets and blood counts are higher than where they need to be.  This places you at increased risk of blood clot, heart attack, and stroke. Your platelets are elevated due to a combination of factors, including your polycythemia as well as iron deficiency. We will proceed with phlebotomy today due to elevated hematocrit (HCT >45%). We will check your blood in 6 weeks to see if you need another phlebotomy. I understand that you would like to avoid medication as much as possible.  We will try to manage your polycythemia with phlebotomy alone, but we may need to revisit Hydrea  or another medication if you are blood counts get too high.  IRON DEFICIENCY: Your iron deficiency is most likely caused by an overproduction of red blood cells (due to polycythemia).  This means that your body uses up most of your iron.  Furthermore, phlebotomy (which we are using to treat your high red blood cells) can also cause low iron. We will try to treat your iron deficiency by starting an iron pill, but this may also cause your red blood cells to increased even more, which would then create the need for more phlebotomy. Start taking iron tablet once daily. Take your iron in the morning if possible (unless is interacts with other medications such as antacids). Take your iron pill with a vitamin C supplement to help your body absorb it better. If you take any antacid or acid reflux medicine at home, take your iron at a different time of day, separated by at least 2 hours from your antacid medication. If you experience any constipation from your iron tablet, try taking over the stool softener such as Colace once daily.  Drink plenty of water .  Eat a high fiber diet and consider taking a fiber supplement. If you continue to have  severe side effects such as constipation or upset stomach, you can decrease your iron to every other day instead.  FOLLOW-UP APPOINTMENT: Office visit in 3 months  ** Thank you for trusting me with your healthcare!  I strive to provide all of my patients with quality care at each visit.  If you receive a survey for this visit, I would be so grateful to you for taking the time to provide feedback.  Thank you in advance!  ~ Dalaya Suppa                                        Dr. Mickiel Davonna Pleasant Barefoot, PA-C    Delon Hope, NP   - - - - - - - - - - - - - - - - - -     Thank you for choosing [facility] at [facility] to provide your oncology and hematology care.  To afford each patient quality time with our provider, please arrive at least 15 minutes before your scheduled appointment time.   If you have a lab appointment with the Cancer Center please come in thru the Main Entrance and check in at the main information desk.  You need to re-schedule your appointment should you arrive 10 or more minutes late.  We strive to give you quality time  with our providers, and arriving late affects you and other patients whose appointments are after yours.  Also, if you no show three or more times for appointments you may be dismissed from the clinic at the providers discretion.     Again, thank you for choosing [facility].  Our hope is that these requests will decrease the amount of time that you wait before being seen by our physicians.       _____________________________________________________________  Should you have questions after your visit to [facility], please contact our office at [phone] and follow the prompts.  Our office hours are 8:00 a.m. and 4:30 p.m. Monday - Friday.  Please note that voicemails left after 4:00 p.m. may not be returned until the following business day.  We are closed weekends and major holidays.  You do  have access to a nurse 24-7, just call the main number to the clinic [phone] and do not press any options, hold on the line and a nurse will answer the phone.    For prescription refill requests, have your pharmacy contact our office and allow 72 hours.

## 2023-10-12 ENCOUNTER — Ambulatory Visit

## 2023-10-12 VITALS — BP 159/89 | HR 101 | Wt 189.1 lb

## 2023-10-12 DIAGNOSIS — R059 Cough, unspecified: Secondary | ICD-10-CM

## 2023-10-12 DIAGNOSIS — I10 Essential (primary) hypertension: Secondary | ICD-10-CM

## 2023-10-12 DIAGNOSIS — M25552 Pain in left hip: Secondary | ICD-10-CM | POA: Diagnosis not present

## 2023-10-12 MED ORDER — MELOXICAM 7.5 MG PO TABS: 7.5000 mg | ORAL_TABLET | Freq: Every day | ORAL | 5 refills | Status: AC

## 2023-10-12 MED ORDER — GUAIFENESIN-CODEINE 100-10 MG/5ML PO SOLN: 5.0000 mL | Freq: Three times a day (TID) | ORAL | 0 refills | Status: AC | PRN

## 2023-10-12 NOTE — Progress Notes (Unsigned)
 Established Patient Office Visit  Subjective   Patient ID: [name] [name], female    DOB: 1946-01-11  Age: 78 y.o. MRN: [id number]  Chief Complaint  Patient presents with   Establish Care   Annual Exam    Has a problem with left leg, lateral side, lower leg, has been a problem since saturday    HPI Discussed the use of AI scribe software for clinical note transcription with the patient, who gave verbal consent to proceed.     [name] [name] is a 78 year old female who presents with left leg pain and requests an ultrasound.  Left lower extremity pain - Intermittent left leg pain described as a cramp - Pain sometimes makes it difficult to bear weight - Keeps a cane in her car for support but did not need it today - No recent physical activity has exacerbated the pain - She recently had and U/S of LLE, which was negative for DVT.    Hip arthralgia and osteoarthritis - History of hip pain and mild arthritis in the hip - Previous x-rays showed no fractures - Not taking any medication for pain at home - Over-the-counter options such as Tylenol  and ibuprofen  are ineffective - No use of prescription medications for this issue  Antihypertensive medication intolerance - History of high blood pressure - Previously tried chlorthalidone , amlodipine , lisinopril, and hydralazine  - Experienced side effects including hair growth, lip swelling, and skin reactions - Currently not on any blood pressure medication  Allergic rhinitis and cough - Seasonal allergies with postnasal drainage - Cough that worsens at night - Previously prescribed cough syrup with codeine  - No current use of allergy medications     Patient Active Problem List   Diagnosis Date Noted   Left hip pain 10/14/2023   Cough in adult 10/14/2023   Chronic kidney disease, stage III (moderate) (HCC) 08/11/2022   Acute bacterial rhinosinusitis 08/11/2022   Bilateral impacted cerumen 04/01/2022   Polycythemia vera  (HCC) 02/16/2022   Erythrocytosis 11/20/2021   Thrombocytosis 11/20/2021   Myalgia due to statin 11/06/2021   Multinodular goiter 11/21/2020   Dysphagia 09/03/2020   Statin intolerance 11/16/2019   Statin myopathy 11/16/2019   Neck pain 09/14/2016   Paresthesia 09/14/2016   Chest pain 09/26/2013   Hypothyroidism    Sleep apnea    Anxiety    NSTEMI (non-ST elevated myocardial infarction) (HCC) 09/20/2013   HLD (hyperlipidemia) 01/17/2008   Essential hypertension 01/17/2008   CAD in native artery 01/17/2008    ROS    Objective:     BP (!) 159/89 (BP Location: Left Arm, Patient Position: Sitting, Cuff Size: Large)   Pulse (!) 101   Wt 189 lb 1.3 oz (85.8 kg)   SpO2 92%   BMI 33.49 kg/m  BP Readings from Last 3 Encounters:  10/12/23 (!) 159/89  10/11/23 (!) 167/86  09/15/23 127/71   Wt Readings from Last 3 Encounters:  10/12/23 189 lb 1.3 oz (85.8 kg)  10/11/23 192 lb 7.4 oz (87.3 kg)  09/15/23 188 lb 0.8 oz (85.3 kg)     Physical Exam Vitals and nursing note reviewed.  Constitutional:      Appearance: Normal appearance. She is obese.  HENT:     Head: Normocephalic.     Right Ear: Tympanic membrane, ear canal and external ear normal.     Left Ear: Tympanic membrane, ear canal and external ear normal.     Nose: Nose normal.     Mouth/Throat:  Mouth: Mucous membranes are moist.     Pharynx: Oropharynx is clear.  Eyes:     Extraocular Movements: Extraocular movements intact.     Pupils: Pupils are equal, round, and reactive to light.  Cardiovascular:     Rate and Rhythm: Normal rate and regular rhythm.  Pulmonary:     Effort: Pulmonary effort is normal.     Breath sounds: Normal breath sounds.  Musculoskeletal:     Cervical back: Normal range of motion and neck supple.  Skin:    General: Skin is warm and dry.  Neurological:     Mental Status: She is alert and oriented to person, place, and time.  Psychiatric:        Mood and Affect: Mood normal.         Thought Content: Thought content normal.     No results found for any visits on 10/12/23.    The ASCVD Risk score (Arnett DK, et al., 2019) failed to calculate for the following reasons:   Risk score cannot be calculated because patient has a medical history suggesting prior/existing ASCVD    Assessment & Plan:   Problem List Items Addressed This Visit       Cardiovascular and Mediastinum   Essential hypertension   Blood pressure at 154/90 mmHg. Adverse reactions to multiple antihypertensives. Reluctant to take medication due to past side effects and personal beliefs.        Other   Left hip pain - Primary   Intermittent cramp-like pain with mild arthritis on x-ray. No recent trauma or overexertion. - Prescribed meloxicam  15 mg once daily with food.      Relevant Medications   meloxicam  (MOBIC ) 7.5 MG tablet   Cough in adult   Cough with postnasal drainage Chronic cough with postnasal drainage likely due to seasonal allergies, particularly ragweed. - Prescribed cough syrup with codeine .      Relevant Medications   guaiFENesin -codeine  100-10 MG/5ML syrup         No follow-ups on file.    Leita Longs, FNP

## 2023-10-14 DIAGNOSIS — R059 Cough, unspecified: Secondary | ICD-10-CM | POA: Insufficient documentation

## 2023-10-14 DIAGNOSIS — M25552 Pain in left hip: Secondary | ICD-10-CM | POA: Insufficient documentation

## 2023-10-14 NOTE — Assessment & Plan Note (Signed)
 Cough with postnasal drainage Chronic cough with postnasal drainage likely due to seasonal allergies, particularly ragweed. - Prescribed cough syrup with codeine .

## 2023-10-14 NOTE — Assessment & Plan Note (Signed)
 Intermittent cramp-like pain with mild arthritis on x-ray. No recent trauma or overexertion. - Prescribed meloxicam  15 mg once daily with food.

## 2023-10-14 NOTE — Assessment & Plan Note (Signed)
 Blood pressure at 154/90 mmHg. Adverse reactions to multiple antihypertensives. Reluctant to take medication due to past side effects and personal beliefs.

## 2023-10-17 ENCOUNTER — Inpatient Hospital Stay

## 2023-10-17 DIAGNOSIS — D45 Polycythemia vera: Secondary | ICD-10-CM | POA: Diagnosis not present

## 2023-10-17 NOTE — Patient Instructions (Signed)
 [facility] [place] - A DEPT OF [facility]  Discharge Instructions: Thank you for choosing [facility] to provide your oncology and hematology care.  If you have a lab appointment with the Cancer Center - please note that after April 8th, 2024, all labs will be drawn in the cancer center.  You do not have to check in or register with the main entrance as you have in the past but will complete your check-in in the cancer center.  Wear comfortable clothing and clothing appropriate for easy access to any Portacath or PICC line.   We strive to give you quality time with your provider. You may need to reschedule your appointment if you arrive late (15 or more minutes).  Arriving late affects you and other patients whose appointments are after yours.  Also, if you miss three or more appointments without notifying the office, you may be dismissed from the clinic at the provider's discretion.      For prescription refill requests, have your pharmacy contact our office and allow 72 hours for refills to be completed.    Today you received your phlebotomy as ordered.    To help prevent nausea and vomiting after your treatment, we encourage you to take your nausea medication as directed.  BELOW ARE SYMPTOMS THAT SHOULD BE REPORTED IMMEDIATELY: *FEVER GREATER THAN 100.4 F (38 C) OR HIGHER *CHILLS OR SWEATING *NAUSEA AND VOMITING THAT IS NOT CONTROLLED WITH YOUR NAUSEA MEDICATION *UNUSUAL SHORTNESS OF BREATH *UNUSUAL BRUISING OR BLEEDING *URINARY PROBLEMS (pain or burning when urinating, or frequent urination) *BOWEL PROBLEMS (unusual diarrhea, constipation, pain near the anus) TENDERNESS IN MOUTH AND THROAT WITH OR WITHOUT PRESENCE OF ULCERS (sore throat, sores in mouth, or a toothache) UNUSUAL RASH, SWELLING OR PAIN  UNUSUAL VAGINAL DISCHARGE OR ITCHING   Items with * indicate a potential emergency and should be followed up as soon as possible or go to the  Emergency Department if any problems should occur.  Please show the CHEMOTHERAPY ALERT CARD or IMMUNOTHERAPY ALERT CARD at check-in to the Emergency Department and triage nurse.  Should you have questions after your visit or need to cancel or reschedule your appointment, please contact [facility] [place] - A DEPT OF [facility] [phone]  and follow the prompts.  Office hours are 8:00 a.m. to 4:30 p.m. Monday - Friday. Please note that voicemails left after 4:00 p.m. may not be returned until the following business day.  We are closed weekends and major holidays. You have access to a nurse at all times for urgent questions. Please call the main number to the clinic [phone] and follow the prompts.  For any non-urgent questions, you may also contact your provider using MyChart. We now offer e-Visits for anyone 73 and older to request care online for non-urgent symptoms. For details visit [email or web address].   Also download the MyChart app! Go to the app store, search MyChart, open the app, select [place], and log in with your MyChart username and password.

## 2023-10-17 NOTE — Progress Notes (Signed)
 [name] [name] presents today for phlebotomy per MD orders. Phlebotomy procedure started at 0921 and ended at 0950. 500 mls removed. Patient observed for 15 minutes after procedure without any incident. Patient tolerated procedure well. IV needle removed intact.

## 2023-11-21 ENCOUNTER — Other Ambulatory Visit: Payer: Self-pay

## 2023-11-21 DIAGNOSIS — D45 Polycythemia vera: Secondary | ICD-10-CM

## 2023-11-22 ENCOUNTER — Inpatient Hospital Stay

## 2023-11-28 ENCOUNTER — Inpatient Hospital Stay: Attending: Hematology

## 2023-11-28 ENCOUNTER — Inpatient Hospital Stay

## 2023-11-28 ENCOUNTER — Other Ambulatory Visit: Payer: Self-pay

## 2023-11-28 ENCOUNTER — Other Ambulatory Visit (HOSPITAL_COMMUNITY)
Admission: RE | Admit: 2023-11-28 | Discharge: 2023-11-28 | Disposition: A | Discharge status: Home / Self Care | Source: Ambulatory Visit | Attending: Nephrology | Admitting: Nephrology

## 2023-11-28 DIAGNOSIS — D631 Anemia in chronic kidney disease: Secondary | ICD-10-CM | POA: Insufficient documentation

## 2023-11-28 DIAGNOSIS — I129 Hypertensive chronic kidney disease with stage 1 through stage 4 chronic kidney disease, or unspecified chronic kidney disease: Secondary | ICD-10-CM | POA: Insufficient documentation

## 2023-11-28 DIAGNOSIS — R809 Proteinuria, unspecified: Secondary | ICD-10-CM | POA: Insufficient documentation

## 2023-11-28 DIAGNOSIS — N189 Chronic kidney disease, unspecified: Secondary | ICD-10-CM | POA: Insufficient documentation

## 2023-11-28 DIAGNOSIS — E211 Secondary hyperparathyroidism, not elsewhere classified: Secondary | ICD-10-CM | POA: Insufficient documentation

## 2023-11-28 DIAGNOSIS — E119 Type 2 diabetes mellitus without complications: Secondary | ICD-10-CM | POA: Insufficient documentation

## 2023-11-28 DIAGNOSIS — E559 Vitamin D deficiency, unspecified: Secondary | ICD-10-CM | POA: Insufficient documentation

## 2023-11-28 DIAGNOSIS — D45 Polycythemia vera: Secondary | ICD-10-CM | POA: Insufficient documentation

## 2023-11-28 DIAGNOSIS — I10 Essential (primary) hypertension: Secondary | ICD-10-CM

## 2023-11-28 DIAGNOSIS — N1832 Chronic kidney disease, stage 3b: Secondary | ICD-10-CM | POA: Insufficient documentation

## 2023-11-28 LAB — RENAL FUNCTION PANEL
Albumin: 4.2 g/dL (ref 3.5–5.0)
Anion gap: 10 (ref 5–15)
BUN: 14 mg/dL (ref 8–23)
CO2: 25 mmol/L (ref 22–32)
Calcium: 9.1 mg/dL (ref 8.9–10.3)
Chloride: 103 mmol/L (ref 98–111)
Creatinine, Ser: 1.02 mg/dL — ABNORMAL HIGH (ref 0.44–1.00)
GFR, Estimated: 56 mL/min — ABNORMAL LOW (ref >60)
Glucose, Bld: 104 mg/dL — ABNORMAL HIGH (ref 70–99)
Phosphorus: 2.6 mg/dL (ref 2.5–4.6)
Potassium: 3.7 mmol/L (ref 3.5–5.1)
Sodium: 138 mmol/L (ref 135–145)

## 2023-11-28 LAB — VITAMIN D 25 HYDROXY (VIT D DEFICIENCY, FRACTURES): Vit D, 25-Hydroxy: 28.76 ng/mL — ABNORMAL LOW (ref 30–100)

## 2023-11-28 LAB — CBC
HCT: 46.8 % — ABNORMAL HIGH (ref 36.0–46.0)
Hemoglobin: 13.6 g/dL (ref 12.0–15.0)
MCH: 20.4 pg — ABNORMAL LOW (ref 26.0–34.0)
MCHC: 29.1 g/dL — ABNORMAL LOW (ref 30.0–36.0)
MCV: 70.2 fL — ABNORMAL LOW (ref 80.0–100.0)
Platelets: 519 K/uL — ABNORMAL HIGH (ref 150–400)
RBC: 6.67 MIL/uL — ABNORMAL HIGH (ref 3.87–5.11)
RDW: 20.4 % — ABNORMAL HIGH (ref 11.5–15.5)
WBC: 6.9 K/uL (ref 4.0–10.5)
nRBC: 0 % (ref 0.0–0.2)

## 2023-11-28 NOTE — Progress Notes (Signed)
 [name] [name] presents today for phlebotomy per MD orders. Phlebotomy procedure started at 1425 and ended at 1435. 500 cc removed. Patient tolerated procedure well. IV needle removed intact.  Reviewed pre and post phlebotomy blood pressures with provider. Patient complains of headache prn at home and in the clinic.  Patient instructed to contact PCP to review  hypertension and chronic headaches and to have good hydration with rest for today verbal Ple[place], PA.  Patient verbalized understanding. No s/s of distress noted.     Patient tolerated phlebotomy with no complaints voiced.  Peripheral IV site intact with no bruising or swelling noted.  Denied SOB, chest pain, or dizziness.  Gauze with coban applied.  Discharged with  no s/s of distress noted.

## 2023-11-28 NOTE — Patient Instructions (Signed)
 [facility] [place] [facility]  Discharge Instructions: Thank you for choosing [facility] to provide your oncology and hematology care.  If you have a lab appointment with the Cancer Center - please note that after April 8th, 2024, all labs will be drawn in the cancer center.  You do not have to check in or register with the main entrance as you have in the past but will complete your check-in in the cancer center.  Wear comfortable clothing and clothing appropriate for easy access to any Portacath or PICC line.   We strive to give you quality time with your provider. You may need to reschedule your appointment if you arrive late (15 or more minutes).  Arriving late affects you and other patients whose appointments are after yours.  Also, if you miss three or more appointments without notifying the office, you may be dismissed from the clinic at the provider's discretion.      For prescription refill requests, have your pharmacy contact our office and allow 72 hours for refills to be completed.    Therapeutic Phlebotomy, Care After The following information offers guidance on how to care for yourself after your procedure. Your health care provider may also give you more specific instructions. If you have problems or questions, contact your health care provider. What can I expect after the procedure? After therapeutic phlebotomy, it is common to have: Light-headedness or dizziness. You may feel faint. Nausea. Tiredness (fatigue). Follow these instructions at home: Eating and drinking Be sure to eat well-balanced meals for the next 24 hours. Drink enough fluid to keep your urine pale yellow. Avoid drinking alcohol on the day that you had the procedure. Activity  Return to your normal activities as told by your health care provider. Most people can go back to their normal activities right away. Avoid activities that take a lot of effort for  about 5 hours after the procedure. Athletes should avoid strenuous exercise for at least 12 hours. Avoid heavy lifting or pulling for about 5 hours after the procedure. Do not lift anything that is heavier than 10 lb (4.5 kg). Change positions slowly for the remainder of the day, like from sitting to standing. This can help prevent light-headedness or fainting. If you feel light-headed, lie down until the feeling goes away. Needle insertion site care  Keep your bandage (dressing) dry. You can remove the bandage after about 5 hours or as told by your health care provider. If you have bleeding from the needle insertion site, raise (elevate) your arm and press firmly on the site until the bleeding stops. If you have bruising at the site, apply ice to the area. To do this: Put ice in a plastic bag. Place a towel between your skin and the bag. Leave the ice on for 20 minutes, 2-3 times a day for the first 24 hours. Remove the ice if your skin turns bright red so you do not damage the area. If the swelling does not go away after 24 hours, apply a warm, moist cloth (warm compress) to the area for 20 minutes, 2-3 times a day. General instructions Do not use any products that contain nicotine or tobacco, like cigarettes, chewing tobacco, and vaping devices, such as e-cigarettes, for at least 30 minutes after the procedure. If you need help quitting, ask your health care provider. Keep all follow-up visits. You may need to continue having regular blood tests and  therapeutic phlebotomy treatments as directed. Contact a health care provider if: You have redness, swelling, or pain at the needle insertion site. Fluid or blood is coming from the needle insertion site. Pus or a bad smell is coming from the needle insertion site. The needle insertion site feels warm to the touch. You feel light-headed, dizzy, or nauseous, and the feeling does not go away. You have new bruising at the needle insertion  site. You feel weaker than normal. You have a fever or chills. Get help right away if: You have chest pain. You have trouble breathing. You have severe nausea or vomiting. Summary After the procedure, it is common to have some light-headedness, dizziness, nausea, or tiredness (fatigue). Be sure to eat well-balanced meals for the next 24 hours. Drink enough fluid to keep your urine pale yellow. Return to your normal activities as told by your health care provider. Keep all follow-up visits. You may need to continue having regular blood tests and therapeutic phlebotomy treatments as directed. This information is not intended to replace advice given to you by your health care provider. Make sure you discuss any questions you have with your health care provider. Document Revised: 07/13/2022 Document Reviewed: 07/23/2020 Elsevier Patient Education  2024 [employer]   To help prevent nausea and vomiting after your treatment, we encourage you to take your nausea medication as directed.  BELOW ARE SYMPTOMS THAT SHOULD BE REPORTED IMMEDIATELY: *FEVER GREATER THAN 100.4 F (38 C) OR HIGHER *CHILLS OR SWEATING *NAUSEA AND VOMITING THAT IS NOT CONTROLLED WITH YOUR NAUSEA MEDICATION *UNUSUAL SHORTNESS OF BREATH *UNUSUAL BRUISING OR BLEEDING *URINARY PROBLEMS (pain or burning when urinating, or frequent urination) *BOWEL PROBLEMS (unusual diarrhea, constipation, pain near the anus) TENDERNESS IN MOUTH AND THROAT WITH OR WITHOUT PRESENCE OF ULCERS (sore throat, sores in mouth, or a toothache) UNUSUAL RASH, SWELLING OR PAIN  UNUSUAL VAGINAL DISCHARGE OR ITCHING   Items with * indicate a potential emergency and should be followed up as soon as possible or go to the Emergency Department if any problems should occur.  Please show the CHEMOTHERAPY ALERT CARD or IMMUNOTHERAPY ALERT CARD at check-in to the Emergency Department and triage nurse.  Should you have questions after your visit or need to  cancel or reschedule your appointment, please contact [facility] [place] - A DEPT OF [facility] [phone]  and follow the prompts.  Office hours are 8:00 a.m. to 4:30 p.m. Monday - Friday. Please note that voicemails left after 4:00 p.m. may not be returned until the following business day.  We are closed weekends and major holidays. You have access to a nurse at all times for urgent questions. Please call the main number to the clinic [phone] and follow the prompts.  For any non-urgent questions, you may also contact your provider using MyChart. We now offer e-Visits for anyone 32 and older to request care online for non-urgent symptoms. For details visit [email or web address].   Also download the MyChart app! Go to the app store, search "MyChart", open the app, select [place], and log in with your MyChart username and password.

## 2023-11-29 LAB — MICROALBUMIN / CREATININE URINE RATIO
Creatinine, Urine: 68.1 mg/dL
Microalb Creat Ratio: 65 mg/g{creat} — ABNORMAL HIGH (ref 0–29)
Microalb, Ur: 44.1 ug/mL — ABNORMAL HIGH

## 2023-11-29 LAB — PARATHYROID HORMONE, INTACT (NO CA): PTH: 45 pg/mL (ref 15–65)

## 2023-12-15 ENCOUNTER — Ambulatory Visit

## 2023-12-19 DIAGNOSIS — D45 Polycythemia vera: Secondary | ICD-10-CM | POA: Diagnosis not present

## 2023-12-19 DIAGNOSIS — I5032 Chronic diastolic (congestive) heart failure: Secondary | ICD-10-CM | POA: Diagnosis not present

## 2023-12-19 DIAGNOSIS — N1831 Chronic kidney disease, stage 3a: Secondary | ICD-10-CM | POA: Diagnosis not present

## 2023-12-19 DIAGNOSIS — I129 Hypertensive chronic kidney disease with stage 1 through stage 4 chronic kidney disease, or unspecified chronic kidney disease: Secondary | ICD-10-CM | POA: Diagnosis not present

## 2024-01-04 ENCOUNTER — Inpatient Hospital Stay

## 2024-01-04 ENCOUNTER — Inpatient Hospital Stay: Admitting: Physician Assistant

## 2024-01-10 NOTE — Progress Notes (Unsigned)
 [facility] [address], [place] [address]   CLINIC:  Medical Oncology/Hematology  PCP:  Bevely Doffing, FNP [address] [place] [address] [phone]   REASON FOR VISIT:  Follow-up for JAK2 polycythemia vera   PRIOR THERAPY: None   CURRENT THERAPY: Hydrea  500 mg MWF (patient not taking as prescribed) + aspirin  81 mg (patient refused) + phlebotomy  INTERVAL HISTORY:   Ms. [name] [name] 78 y.o. female returns for routine follow-up of JAK2 polycythemia vera.   She was last seen by Pleasant Barefoot PA-C on 10/11/2023. Last phlebotomies were on 10/17/2023 and 11/28/2023.   At today's visit, she reports feeling fairly well overall. She does continue to have issues with blood pressure, being managed by primary care and nephrology.  Patient reports taking her Hydrea  on and off, taking it 500 mg MWF approximately 40% of the time. She denies any significant side effects during the weeks that she chooses to take her Hydrea .  No skin sores, mouth sores, fatigue, or GI upset. She reports good energy. She has declined to take aspirin  after doing her own research.     Overall, she feels better after starting phlebotomy. She does report some increased headaches in the setting of increased hematocrit and high blood pressure. She has ongoing tinnitus.   She denies any aquagenic pruritus, erythromelalgia, or B symptoms.  She has 100% energy and 100% appetite.  She endorses that she is maintaining a stable weight.  ASSESSMENT & PLAN:  1.  JAK2 positive polycythemia vera - Seen at request of Dr. Rachele for elevated hemoglobin and platelet count (CBC from 11/20/2021 showed Hgb 16.9/hematocrit 51.1, platelets 613) - Review of EMR shows platelet count elevated since August 2015,  Hemoglobin elevated since January 2019 - No history of DVT, PE, MI, or CVA. - Hematology work-up (11/20/2021):  JAK2 V617F positive.  Low erythropoietin  1.8 - Hydrea  started  on 01/14/2022.  Unable to tolerate dosage >1000 mg daily due to side effects.  She stopped Hydrea  in July 2025, detailed below. Patient had stopped taking her Hydrea  earlier this year. Restarted Hydrea  about 3 months ago (500 mg MWF).  Reports that she had to mouth sores within a month after restarting her Hydrea .  She therefore stopped taking Hydrea , and reports that her mouth sores resolved.  She has not restarted Hydrea . Patient exhibits STRONG PREFERENCE to avoid Hydrea  and other prescribed medications.  Reports that she saw her husband suffer through cancer treatment and does not want chemotherapy. - Aspirin  81 mg recommended with extensive discussion regarding medical indication for antiplatelet agents.  Patient states that she did her own research and knows that it could give her a heart attack, so she does not take it - Labs today (01/11/2024): Hgb 12.9/hematocrit 45.8.  Platelets 468. - She is feeling better (decreased headaches and tinnitus, improved blood pressure) after starting phlebotomy, but reports worsening headaches when hematocrit trends upward. - Extensive discussion regarding diagnosis of polycythemia vera, including risk of thrombosis (CVA, MI, DVT, PE) as well as risk of myelofibrosis and leukemia in some patients. - Based on her age, she is considered high risk and therefore recommended cytoreductive therapy with hydroxyurea .  Patient has been taking Hydrea  inconsistently.  Prescribed Hydrea  500 mg MWF, self-reported to take about 40% of the time. - Phlebotomy being used for control of hematocrit, due to inconsistency in taking Hydrea .  Have discussed with patient that this will not improve her platelets.   - Recommend against  iron repletion, as this will further fuel abnormal erythropoiesis in the setting of PV.  (Patient previously tried iron supplement, but this caused hematocrit to trend upward, and platelets were unimproved.  Although thrombocytosis can also be reactive from  iron deficiency, I suspect that her thrombocytosis is related to her JAK2 mutation.) - Patient was inquiring about treatment with PTG-300 ([place]), which is currently only available in clinical trials.  Discussed with patient that clinical trials are available in  , and if she would like to pursue this we can place referral. - PLAN: Discussed with patient that best control of her blood counts would be with consistent compliance with Hydrea  500 mg MWF.  She will think about this. - She is agreeable to continue CBC/phlebotomy every 6 weeks (if HCT >45.0), although we may be able to decrease the frequency of this if she is more consistent with Hydrea . - Recommended aspirin  81 mg daily (patient refuses) - Labs in 3 months = CBC/D, CMP, LDH  - OFFICE in 3 months (same-day labs + possible phlebotomy)  2.  Social/family history: - Lives at home with her family.  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  She was never smoker. - No family history of leukemias or malignancies.  PLAN SUMMARY: >> CBC + possible therapeutic phlebotomy every 6 weeks (if HCT > 45.0) >> Labs in 3 months = CBC/D, CMP, LDH >> OFFICE visit in 3 months (same day as labs + possible phlebotomy)     REVIEW OF SYSTEMS:  Review of Systems  Constitutional:  Negative for appetite change, chills, diaphoresis, fatigue, fever and unexpected weight change.  HENT:   Negative for lump/mass and nosebleeds.   Eyes:  Negative for eye problems.  Respiratory:  Positive for cough (sinus drainage). Negative for hemoptysis and shortness of breath.   Cardiovascular:  Positive for chest pain (at times) and palpitations. Negative for leg swelling.  Gastrointestinal:  Negative for abdominal pain, blood in stool, constipation, diarrhea, nausea and vomiting.  Genitourinary:  Negative for hematuria.   Skin: Negative.   Neurological:  Positive for headaches. Negative  for dizziness and light-headedness.  Hematological:  Does not bruise/bleed easily.     PHYSICAL EXAM:  ECOG PERFORMANCE STATUS: 1 - Symptomatic but completely ambulatory  Vitals:   01/11/24 1423 01/11/24 1433  BP: (!) 194/107 (!) 187/112  Pulse: 69   Resp: 18   Temp: (!) 97.3 F (36.3 C)   SpO2: 95%      Filed Weights   01/11/24 1423  Weight: 184 lb (83.5 kg)    Physical Exam Constitutional:      Appearance: Normal appearance. She is obese.  Cardiovascular:     Heart sounds: Normal heart sounds.  Pulmonary:     Breath sounds: Normal breath sounds.  Neurological:     General: No focal deficit present.     Mental Status: Mental status is at baseline.  Psychiatric:        Behavior: Behavior normal. Behavior is cooperative.    PAST MEDICAL/SURGICAL HISTORY:  Past Medical History:  Diagnosis Date   Anxiety    Dysphagia 09/03/2020   Essential hypertension    HLD (hyperlipidemia)    Hx of statin intolerance   Hypothyroidism    NSTEMI (non-ST elevated myocardial infarction) (HCC) 2015   a.  Related to occlusion of the distal Cx. The mid to distal OM1 also contains a significant stenosis that is best treated with  medical therapy. Patent RCA/LAD with tortuosity. LM patent. EF 55%.    Polycythemia vera (HCC)    Stage 3b chronic kidney disease    Past Surgical History:  Procedure Laterality Date   ABDOMINAL HYSTERECTOMY     CARDIAC CATHETERIZATION  ~ 2000   CATARACT EXTRACTION W/PHACO Right 12/24/2022   Procedure: CATARACT EXTRACTION PHACO AND INTRAOCULAR LENS PLACEMENT (IOC);  Surgeon: Harrie Agent, MD;  Location: [facility];  Service: Ophthalmology;  Laterality: Right;  CDE - 15.11   CORONARY ANGIOPLASTY  09/20/2013   LEFT HEART CATHETERIZATION WITH CORONARY ANGIOGRAM N/A 09/20/2013   Procedure: LEFT HEART CATHETERIZATION WITH CORONARY ANGIOGRAM;  Surgeon: Victory LELON Claudene DOUGLAS, MD;  Location: [facility] CATH LAB;  Service: Cardiovascular;  Laterality: N/A;   LEFT HEART  CATHETERIZATION WITH CORONARY ANGIOGRAM N/A 09/27/2013   Procedure: LEFT HEART CATHETERIZATION WITH CORONARY ANGIOGRAM;  Surgeon: Ozell JONETTA Fell, MD;  Location: [facility] CATH LAB;  Service: Cardiovascular;  Laterality: N/A;   THYROIDECTOMY  10/08/2011   Procedure: THYROIDECTOMY;  Surgeon: Oneil DELENA Budge, MD;  Location: [facility];  Service: General;  Laterality: N/A;  Total   VAGINAL HYSTERECTOMY  1993    SOCIAL HISTORY:  Social History   Socioeconomic History   Marital status: Widowed    Spouse name: Not on file   Number of children: Not on file   Years of education: Not on file   Highest education level: Not on file  Occupational History   Not on file  Tobacco Use   Smoking status: Never    Passive exposure: Never   Smokeless tobacco: Never  Vaping Use   Vaping status: Never Used  Substance and Sexual Activity   Alcohol  use: No    Alcohol /week: 0.0 standard drinks of alcohol    Drug use: No   Sexual activity: Never  Other Topics Concern   Not on file  Social History Narrative   ** Merged History Encounter **  Lives at home with family.  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      Social Drivers of Corporate Investment Banker Strain: Low Risk  (07/21/2020)   Overall Financial Resource Strain (CARDIA)    Difficulty of Paying Living Expenses: Not very hard  Food Insecurity: No Food Insecurity (07/21/2020)   Hunger Vital Sign    Worried About Running Out of Food in the Last Year: Never true    Ran Out of Food in the Last Year: Never true  Transportation Needs: No Transportation Needs (09/08/2020)   PRAPARE - Administrator, Civil Service (Medical): No    Lack of Transportation (Non-Medical): No  Physical Activity: Sufficiently Active (03/11/2020)   Exercise Vital Sign    Days of Exercise per Week: 5 days    Minutes of Exercise per Session: 30 min  Stress: No Stress Concern Present (03/11/2020)   [employer] of Occupational Health - Occupational Stress Questionnaire    Feeling of  Stress : Not at all  Social Connections: Not on file  Intimate Partner Violence: Not on file    FAMILY HISTORY:  Family History  Problem Relation Age of Onset   Colon cancer Father    Thyroid  disease Neg Hx     CURRENT MEDICATIONS:  Outpatient Encounter Medications as of 01/11/2024  Medication Sig   hydroxyurea  (HYDREA ) 500 MG capsule Take 500 mg by mouth 3 (three) times a week.   JARDIANCE 10 MG TABS tablet Take 10 mg by mouth daily.   losartan (COZAAR) 50 MG tablet Take  50 mg by mouth daily.   spironolactone  (ALDACTONE ) 25 MG tablet Take 12.5 mg by mouth daily.   Blood Pressure Monitoring KIT 1 each by Does not apply route as directed. Dx: resistant hypertension   guaiFENesin -codeine  100-10 MG/5ML syrup Take 5 mLs by mouth 3 (three) times daily as needed for cough.   meloxicam  (MOBIC ) 7.5 MG tablet Take 1 tablet (7.5 mg total) by mouth daily.   No facility-administered encounter medications on file as of 01/11/2024.    ALLERGIES:  Allergies  Allergen Reactions   Fluviral [Influenza Virus Vaccine] Shortness Of Breath, Swelling and Other (See Comments)    Including back pain that radiates in upper extremeties   Haemophilus Influenzae Other (See Comments), Shortness Of Breath and Swelling    Including back pain that radiates in upper extremeties   Hydralazine  Shortness Of Breath   Amlodipine      Lip swelling per patient.    Lisinopril     Dr. Carlette stopped due to side effects per patient.  Could remember what side effects were.     Ranexa  [Ranolazine ]     Chest pain    LABORATORY DATA:  I have reviewed the labs as listed.  CBC    Component Value Date/Time   WBC 6.9 01/11/2024 1326   RBC 6.54 (H) 01/11/2024 1326   HGB 12.9 01/11/2024 1326   HCT 45.8 01/11/2024 1326   PLT 468 (H) 01/11/2024 1326   MCV 70.0 (L) 01/11/2024 1326   MCH 19.7 (L) 01/11/2024 1326   MCHC 28.2 (L) 01/11/2024 1326   RDW 20.9 (H) 01/11/2024 1326   LYMPHSABS 1.9 01/11/2024 1326   MONOABS 0.3  01/11/2024 1326   EOSABS 0.3 01/11/2024 1326   BASOSABS 0.0 01/11/2024 1326      Latest Ref Rng & Units 01/11/2024    1:26 PM 11/28/2023    1:54 PM 10/11/2023    8:39 AM  CMP  Glucose 70 - 99 mg/dL 77  895  98   BUN 8 - 23 mg/dL 16  14  18    Creatinine 0.44 - 1.00 mg/dL 8.93  8.97  8.74   Sodium 135 - 145 mmol/L 138  138  137   Potassium 3.5 - 5.1 mmol/L 3.3  3.7  3.5   Chloride 98 - 111 mmol/L 102  103  101   CO2 22 - 32 mmol/L 22  25  25    Calcium  8.9 - 10.3 mg/dL 9.0  9.1  9.0   Total Protein 6.5 - 8.1 g/dL 7.7   7.4   Total Bilirubin 0.0 - 1.2 mg/dL 0.5   0.7   Alkaline Phos 38 - 126 U/L 89   54   AST 15 - 41 U/L 30   21   ALT 0 - 44 U/L 12   13     DIAGNOSTIC IMAGING:  I have independently reviewed the relevant imaging and discussed with the patient.   WRAP UP:  All questions were answered. The patient knows to call the clinic with any problems, questions or concerns.  Medical decision making: Moderate  Time spent on visit: I spent 20 minutes counseling the patient face to face. The total time spent in the appointment was 30 minutes and more than 50% was on counseling.  Pleasant CHRISTELLA Barefoot, PA-C  01/11/24 4:46 PM

## 2024-01-11 ENCOUNTER — Inpatient Hospital Stay

## 2024-01-11 ENCOUNTER — Inpatient Hospital Stay: Attending: Hematology | Admitting: Physician Assistant

## 2024-01-11 VITALS — BP 187/112 | HR 69 | Temp 97.3°F | Resp 18 | Wt 184.0 lb

## 2024-01-11 VITALS — BP 108/106 | HR 73 | Temp 97.7°F | Resp 18

## 2024-01-11 DIAGNOSIS — K1379 Other lesions of oral mucosa: Secondary | ICD-10-CM | POA: Insufficient documentation

## 2024-01-11 DIAGNOSIS — J069 Acute upper respiratory infection, unspecified: Secondary | ICD-10-CM | POA: Diagnosis not present

## 2024-01-11 DIAGNOSIS — D45 Polycythemia vera: Secondary | ICD-10-CM | POA: Diagnosis not present

## 2024-01-11 DIAGNOSIS — Z1589 Genetic susceptibility to other disease: Secondary | ICD-10-CM

## 2024-01-11 DIAGNOSIS — N1831 Chronic kidney disease, stage 3a: Secondary | ICD-10-CM

## 2024-01-11 DIAGNOSIS — D75839 Thrombocytosis, unspecified: Secondary | ICD-10-CM | POA: Diagnosis not present

## 2024-01-11 DIAGNOSIS — Z800 Family history of malignant neoplasm of digestive organs: Secondary | ICD-10-CM | POA: Insufficient documentation

## 2024-01-11 LAB — CBC WITH DIFFERENTIAL/PLATELET
Abs Immature Granulocytes: 0.03 K/uL (ref 0.00–0.07)
Basophils Absolute: 0 K/uL (ref 0.0–0.1)
Basophils Relative: 1 %
Eosinophils Absolute: 0.3 K/uL (ref 0.0–0.5)
Eosinophils Relative: 4 %
HCT: 45.8 % (ref 36.0–46.0)
Hemoglobin: 12.9 g/dL (ref 12.0–15.0)
Immature Granulocytes: 0 %
Lymphocytes Relative: 28 %
Lymphs Abs: 1.9 K/uL (ref 0.7–4.0)
MCH: 19.7 pg — ABNORMAL LOW (ref 26.0–34.0)
MCHC: 28.2 g/dL — ABNORMAL LOW (ref 30.0–36.0)
MCV: 70 fL — ABNORMAL LOW (ref 80.0–100.0)
Monocytes Absolute: 0.3 K/uL (ref 0.1–1.0)
Monocytes Relative: 5 %
Neutro Abs: 4.3 K/uL (ref 1.7–7.7)
Neutrophils Relative %: 62 %
Platelets: 468 K/uL — ABNORMAL HIGH (ref 150–400)
RBC: 6.54 MIL/uL — ABNORMAL HIGH (ref 3.87–5.11)
RDW: 20.9 % — ABNORMAL HIGH (ref 11.5–15.5)
WBC: 6.9 K/uL (ref 4.0–10.5)
nRBC: 0 % (ref 0.0–0.2)

## 2024-01-11 LAB — IRON AND TIBC
Iron: 20 ug/dL — ABNORMAL LOW (ref 28–170)
Saturation Ratios: 5 % — ABNORMAL LOW (ref 10.4–31.8)
TIBC: 440 ug/dL (ref 250–450)
UIBC: 419 ug/dL

## 2024-01-11 LAB — COMPREHENSIVE METABOLIC PANEL WITH GFR
ALT: 12 U/L (ref 0–44)
AST: 30 U/L (ref 15–41)
Albumin: 4.3 g/dL (ref 3.5–5.0)
Alkaline Phosphatase: 89 U/L (ref 38–126)
Anion gap: 14 (ref 5–15)
BUN: 16 mg/dL (ref 8–23)
CO2: 22 mmol/L (ref 22–32)
Calcium: 9 mg/dL (ref 8.9–10.3)
Chloride: 102 mmol/L (ref 98–111)
Creatinine, Ser: 1.06 mg/dL — ABNORMAL HIGH (ref 0.44–1.00)
GFR, Estimated: 54 mL/min — ABNORMAL LOW (ref >60)
Glucose, Bld: 77 mg/dL (ref 70–99)
Potassium: 3.3 mmol/L — ABNORMAL LOW (ref 3.5–5.1)
Sodium: 138 mmol/L (ref 135–145)
Total Bilirubin: 0.5 mg/dL (ref 0.0–1.2)
Total Protein: 7.7 g/dL (ref 6.5–8.1)

## 2024-01-11 LAB — LACTATE DEHYDROGENASE: LDH: 320 U/L — ABNORMAL HIGH (ref 105–235)

## 2024-01-11 LAB — FERRITIN: Ferritin: 9 ng/mL — ABNORMAL LOW (ref 11–307)

## 2024-01-11 MED ORDER — HYDROXYUREA 500 MG PO CAPS: 500.0000 mg | ORAL_CAPSULE | ORAL | 0 refills | Status: DC

## 2024-01-11 NOTE — Progress Notes (Signed)
 Aldona LELON Minus presents today for theraputic phlebotomy per MD orders. Last hgb/hct on was 12.9/45.8. VSS prior to procedure. Pt reports eating before arrival. Procedure started at 1546 using patients left forearm/wrist. 250 grams of blood removed. Procedure ended at 1615. Gauze and coban applied to [facility], site clean and dry. VSS upon completion of procedure. Pt denies dizziness, lightheadedness, or feeling faint. Patient did not stay recommended 30 minute wait time.

## 2024-01-11 NOTE — Patient Instructions (Signed)
 [facility] [place] - A DEPT OF [facility]  Discharge Instructions: Thank you for choosing [facility] to provide your oncology and hematology care.  If you have a lab appointment with the Cancer Center - please note that after April 8th, 2024, all labs will be drawn in the cancer center.  You do not have to check in or register with the main entrance as you have in the past but will complete your check-in in the cancer center.  Wear comfortable clothing and clothing appropriate for easy access to any Portacath or PICC line.   We strive to give you quality time with your provider. You may need to reschedule your appointment if you arrive late (15 or more minutes).  Arriving late affects you and other patients whose appointments are after yours.  Also, if you miss three or more appointments without notifying the office, you may be dismissed from the clinic at the provider's discretion.      For prescription refill requests, have your pharmacy contact our office and allow 72 hours for refills to be completed.    Today you received the following therapeutic phlebotomy.  Therapeutic Phlebotomy Therapeutic phlebotomy is the planned removal of blood from a person's body for the purpose of treating a medical condition. The procedure is lot like donating blood. Usually, about a pint (470 mL, or 0.47 L) of blood is removed. The average adult has 9-12 pints (4.3-5.7 L) of blood in his or her body. Therapeutic phlebotomy may be used to treat the following medical conditions: Hemochromatosis. This is a condition in which the blood contains too much iron. Polycythemia vera. This is a condition in which the blood contains too many red blood cells. Porphyria cutanea tarda. This is a disease in which an important part of hemoglobin is not made properly. It results in the buildup of abnormal amounts of porphyrins in the body. Sickle cell disease. This is a condition in which  the red blood cells form an abnormal crescent shape rather than a round shape. Tell a health care provider about: Any allergies you have. All medicines you are taking, including vitamins, herbs, eye drops, creams, and over-the-counter medicines. Any bleeding problems you have. Any surgeries you have had. Any medical conditions you have. Whether you are pregnant or may be pregnant. What are the risks? Generally, this is a safe procedure. However, problems may occur, including: Nausea or light-headedness. Low blood pressure (hypotension). Soreness, bleeding, swelling, or bruising at the needle insertion site. Infection. What happens before the procedure? Ask your health care provider about: Changing or stopping your regular medicines. This is especially important if you are taking diabetes medicines or blood thinners. Taking medicines such as aspirin  and ibuprofen . These medicines can thin your blood. Do not take these medicines unless your health care provider tells you to take them. Taking over-the-counter medicines, vitamins, herbs, and supplements. Wear clothing with sleeves that can be raised above the elbow. You may have a blood sample taken. Your blood pressure, pulse rate, and breathing rate will be measured. What happens during the procedure?  You may be given a medicine to numb the area (local anesthetic). A tourniquet will be placed on your arm. A needle will be put into one of your veins. Tubing and a collection bag will be attached to the needle. Blood will flow through the needle and tubing into the collection bag. The collection bag will be placed lower than your arm so  gravity can help the blood flow into the bag. You may be asked to open and close your hand slowly and continually during the entire collection. After the specified amount of blood has been removed from your body, the collection bag and tubing will be clamped. The needle will be removed from your  vein. Pressure will be held on the needle site to stop the bleeding. A bandage (dressing) will be placed over the needle insertion site. The procedure may vary among health care providers and hospitals. What happens after the procedure? Your blood pressure, pulse rate, and breathing rate will be measured after the procedure. You will be encouraged to drink fluids. You will be encouraged to eat a snack to prevent a low blood sugar level. Your recovery will be assessed and monitored. Return to your normal activities as told by your health care provider. Summary Therapeutic phlebotomy is the planned removal of blood from a person's body for the purpose of treating a medical condition. Therapeutic phlebotomy may be used to treat hemochromatosis, polycythemia vera, porphyria cutanea tarda, or sickle cell disease. In the procedure, a needle is inserted and about a pint (470 mL, or 0.47 L) of blood is removed. The average adult has 9-12 pints (4.3-5.7 L) of blood in the body. This is generally a safe procedure, but it can sometimes cause problems such as nausea, light-headedness, or low blood pressure (hypotension). This information is not intended to replace advice given to you by your health care provider. Make sure you discuss any questions you have with your health care provider. Document Revised: 07/23/2020 Document Reviewed: 07/23/2020 Elsevier Patient Education  2024 Elsevier Inc.   To help prevent nausea and vomiting after your treatment, we encourage you to take your nausea medication as directed.  BELOW ARE SYMPTOMS THAT SHOULD BE REPORTED IMMEDIATELY: *FEVER GREATER THAN 100.4 F (38 C) OR HIGHER *CHILLS OR SWEATING *NAUSEA AND VOMITING THAT IS NOT CONTROLLED WITH YOUR NAUSEA MEDICATION *UNUSUAL SHORTNESS OF BREATH *UNUSUAL BRUISING OR BLEEDING *URINARY PROBLEMS (pain or burning when urinating, or frequent urination) *BOWEL PROBLEMS (unusual diarrhea, constipation, pain near the  anus) TENDERNESS IN MOUTH AND THROAT WITH OR WITHOUT PRESENCE OF ULCERS (sore throat, sores in mouth, or a toothache) UNUSUAL RASH, SWELLING OR PAIN  UNUSUAL VAGINAL DISCHARGE OR ITCHING   Items with * indicate a potential emergency and should be followed up as soon as possible or go to the Emergency Department if any problems should occur.  Please show the CHEMOTHERAPY ALERT CARD or IMMUNOTHERAPY ALERT CARD at check-in to the Emergency Department and triage nurse.  Should you have questions after your visit or need to cancel or reschedule your appointment, please contact [facility] [place] - A DEPT OF [facility] [phone]  and follow the prompts.  Office hours are 8:00 a.m. to 4:30 p.m. Monday - Friday. Please note that voicemails left after 4:00 p.m. may not be returned until the following business day.  We are closed weekends and major holidays. You have access to a nurse at all times for urgent questions. Please call the main number to the clinic [phone] and follow the prompts.  For any non-urgent questions, you may also contact your provider using MyChart. We now offer e-Visits for anyone 69 and older to request care online for non-urgent symptoms. For details visit [email or web address].   Also download the MyChart app! Go to the app store, search MyChart, open the app, select [place], and log in with your  MyChart username and password.

## 2024-01-11 NOTE — Patient Instructions (Addendum)
 [facility] at [facility] **VISIT SUMMARY & IMPORTANT INSTRUCTIONS **   You were seen today by Pleasant Barefoot PA-C for your polycythemia vera.    POLYCYTHEMIA VERA: Your JAK2 mutation causes your bone marrow to produce too many platelets and red blood cells.  Your elevated platelets and elevated red blood cells place you at increased risk of blood clot, heart attack, and stroke. We will proceed with phlebotomy today due to elevated hematocrit (HCT >45%). We will check your blood and perform phlebotomy every 6 weeks as needed.  I believe that the best way to control your blood counts would be to consistently take Hydrea  500 mg on Monday, Wednesday, and Friday.  If you decide to restart your Hydrea , you most likely would not need your phlebotomy as frequently.  IRON DEFICIENCY: Your iron deficiency is most likely caused by an overproduction of red blood cells (due to polycythemia).  This means that your body uses up most of your iron.  Furthermore, phlebotomy (which we are using to treat your high red blood cells) can also cause low iron. At this time, I do NOT recommend taking any iron supplement, as it would likely only fuel your body's overproduction of red blood cells.  FOLLOW-UP APPOINTMENT: Office visit in 3 months  ** Thank you for trusting me with your healthcare!  I strive to provide all of my patients with quality care at each visit.  If you receive a survey for this visit, I would be so grateful to you for taking the time to provide feedback.  Thank you in advance!  ~ Grasiela Jonsson                                        Dr. Mickiel Davonna Pleasant Barefoot, PA-C    Delon Hope, NP   - - - - - - - - - - - - - - - - - -     Thank you for choosing [facility] at [facility] to provide your oncology and hematology care.  To afford each patient quality time with our provider, please arrive at least 15 minutes before your scheduled  appointment time.   If you have a lab appointment with the Cancer Center please come in thru the Main Entrance and check in at the main information desk.  You need to re-schedule your appointment should you arrive 10 or more minutes late.  We strive to give you quality time with our providers, and arriving late affects you and other patients whose appointments are after yours.  Also, if you no show three or more times for appointments you may be dismissed from the clinic at the providers discretion.     Again, thank you for choosing [facility].  Our hope is that these requests will decrease the amount of time that you wait before being seen by our physicians.       _____________________________________________________________  Should you have questions after your visit to [facility], please contact our office at [phone] and follow the prompts.  Our office hours are 8:00 a.m. and 4:30 p.m. Monday - Friday.  Please note that voicemails left after 4:00 p.m. may not be returned until the following business day.  We are closed weekends and major holidays.  You do have access to  a nurse 24-7, just call the main number to the clinic [phone] and do not press any options, hold on the line and a nurse will answer the phone.    For prescription refill requests, have your pharmacy contact our office and allow 72 hours.

## 2024-01-12 ENCOUNTER — Other Ambulatory Visit: Payer: Self-pay | Admitting: *Deleted

## 2024-01-12 DIAGNOSIS — D45 Polycythemia vera: Secondary | ICD-10-CM

## 2024-01-12 MED ORDER — HYDROXYUREA 500 MG PO CAPS: 500.0000 mg | ORAL_CAPSULE | ORAL | 0 refills | Status: AC

## 2024-01-12 NOTE — Telephone Encounter (Signed)
 Patient tolerating Hydrea .  Per OVN, patient is to continue therapy.

## 2024-01-19 DIAGNOSIS — M79642 Pain in left hand: Secondary | ICD-10-CM | POA: Diagnosis not present

## 2024-01-19 DIAGNOSIS — M25532 Pain in left wrist: Secondary | ICD-10-CM | POA: Diagnosis not present

## 2024-01-19 DIAGNOSIS — Z041 Encounter for examination and observation following transport accident: Secondary | ICD-10-CM | POA: Diagnosis not present

## 2024-01-19 DIAGNOSIS — S638X2A Sprain of other part of left wrist and hand, initial encounter: Secondary | ICD-10-CM | POA: Diagnosis not present

## 2024-02-10 ENCOUNTER — Encounter: Payer: Self-pay | Admitting: Oncology

## 2024-02-15 ENCOUNTER — Encounter: Payer: Self-pay | Admitting: Oncology

## 2024-02-22 ENCOUNTER — Inpatient Hospital Stay: Attending: Hematology

## 2024-02-22 ENCOUNTER — Inpatient Hospital Stay

## 2024-03-20 ENCOUNTER — Inpatient Hospital Stay: Payer: Self-pay | Attending: Hematology

## 2024-03-20 ENCOUNTER — Inpatient Hospital Stay: Payer: Self-pay

## 2024-04-10 ENCOUNTER — Ambulatory Visit

## 2024-04-11 ENCOUNTER — Inpatient Hospital Stay

## 2024-04-11 ENCOUNTER — Ambulatory Visit: Payer: Self-pay

## 2024-04-11 ENCOUNTER — Inpatient Hospital Stay: Attending: Hematology

## 2024-04-17 ENCOUNTER — Inpatient Hospital Stay: Admitting: Physician Assistant

## 2024-04-18 ENCOUNTER — Ambulatory Visit: Admitting: Cardiology
